# Patient Record
Sex: Female | Born: 1950 | Race: White | Hispanic: No | Marital: Married | Smoking: Current every day smoker
Health system: Southern US, Community
[De-identification: ages and names within clinical notes are randomized; demographics above are authoritative.]

## PROBLEM LIST (undated history)

## (undated) DIAGNOSIS — I10 Essential (primary) hypertension: Secondary | ICD-10-CM

## (undated) DIAGNOSIS — S4292XA Fracture of left shoulder girdle, part unspecified, initial encounter for closed fracture: Secondary | ICD-10-CM

## (undated) DIAGNOSIS — E039 Hypothyroidism, unspecified: Secondary | ICD-10-CM

## (undated) DIAGNOSIS — S62109A Fracture of unspecified carpal bone, unspecified wrist, initial encounter for closed fracture: Secondary | ICD-10-CM

## (undated) DIAGNOSIS — I5020 Unspecified systolic (congestive) heart failure: Secondary | ICD-10-CM

## (undated) DIAGNOSIS — I4891 Unspecified atrial fibrillation: Secondary | ICD-10-CM

## (undated) DIAGNOSIS — R140 Abdominal distension (gaseous): Secondary | ICD-10-CM

## (undated) DIAGNOSIS — K449 Diaphragmatic hernia without obstruction or gangrene: Secondary | ICD-10-CM

## (undated) DIAGNOSIS — F0390 Unspecified dementia without behavioral disturbance: Secondary | ICD-10-CM

## (undated) DIAGNOSIS — R51 Headache: Secondary | ICD-10-CM

## (undated) DIAGNOSIS — I447 Left bundle-branch block, unspecified: Secondary | ICD-10-CM

## (undated) DIAGNOSIS — K589 Irritable bowel syndrome without diarrhea: Secondary | ICD-10-CM

## (undated) DIAGNOSIS — T4145XA Adverse effect of unspecified anesthetic, initial encounter: Secondary | ICD-10-CM

## (undated) DIAGNOSIS — R413 Other amnesia: Secondary | ICD-10-CM

## (undated) DIAGNOSIS — R1013 Epigastric pain: Secondary | ICD-10-CM

## (undated) DIAGNOSIS — B9681 Helicobacter pylori [H. pylori] as the cause of diseases classified elsewhere: Secondary | ICD-10-CM

## (undated) DIAGNOSIS — K219 Gastro-esophageal reflux disease without esophagitis: Secondary | ICD-10-CM

## (undated) DIAGNOSIS — T8859XA Other complications of anesthesia, initial encounter: Secondary | ICD-10-CM

## (undated) DIAGNOSIS — K2970 Gastritis, unspecified, without bleeding: Secondary | ICD-10-CM

## (undated) HISTORY — DX: Epigastric pain: R10.13

## (undated) HISTORY — DX: Irritable bowel syndrome, unspecified: K58.9

## (undated) HISTORY — DX: Other amnesia: R41.3

## (undated) HISTORY — DX: Unspecified systolic (congestive) heart failure: I50.20

## (undated) HISTORY — DX: Unspecified atrial fibrillation: I48.91

## (undated) HISTORY — PX: CHOLECYSTECTOMY: SHX55

## (undated) HISTORY — PX: TUBAL LIGATION: SHX77

## (undated) HISTORY — DX: Gastritis, unspecified, without bleeding: K29.70

## (undated) HISTORY — DX: Abdominal distension (gaseous): R14.0

## (undated) HISTORY — DX: Diaphragmatic hernia without obstruction or gangrene: K44.9

## (undated) HISTORY — DX: Headache: R51

## (undated) HISTORY — PX: APPENDECTOMY: SHX54

## (undated) HISTORY — DX: Helicobacter pylori (H. pylori) as the cause of diseases classified elsewhere: B96.81

---

## 2000-08-27 ENCOUNTER — Encounter: Payer: Self-pay | Admitting: Family Medicine

## 2000-08-27 ENCOUNTER — Ambulatory Visit (HOSPITAL_COMMUNITY): Admission: RE | Admit: 2000-08-27 | Discharge: 2000-08-27 | Payer: Self-pay | Admitting: Family Medicine

## 2000-09-30 ENCOUNTER — Emergency Department (HOSPITAL_COMMUNITY): Admission: EM | Admit: 2000-09-30 | Discharge: 2000-09-30 | Payer: Self-pay | Admitting: Emergency Medicine

## 2001-09-10 ENCOUNTER — Encounter: Payer: Self-pay | Admitting: Family Medicine

## 2001-09-10 ENCOUNTER — Ambulatory Visit (HOSPITAL_COMMUNITY): Admission: RE | Admit: 2001-09-10 | Discharge: 2001-09-10 | Payer: Self-pay | Admitting: Family Medicine

## 2001-09-17 ENCOUNTER — Ambulatory Visit (HOSPITAL_COMMUNITY): Admission: RE | Admit: 2001-09-17 | Discharge: 2001-09-17 | Payer: Self-pay | Admitting: Family Medicine

## 2001-09-17 ENCOUNTER — Encounter: Payer: Self-pay | Admitting: Family Medicine

## 2001-12-16 ENCOUNTER — Encounter: Payer: Self-pay | Admitting: Internal Medicine

## 2001-12-16 ENCOUNTER — Ambulatory Visit (HOSPITAL_COMMUNITY): Admission: RE | Admit: 2001-12-16 | Discharge: 2001-12-16 | Payer: Self-pay | Admitting: Internal Medicine

## 2002-02-13 DIAGNOSIS — B9681 Helicobacter pylori [H. pylori] as the cause of diseases classified elsewhere: Secondary | ICD-10-CM

## 2002-02-13 HISTORY — DX: Helicobacter pylori (H. pylori) as the cause of diseases classified elsewhere: B96.81

## 2002-09-17 ENCOUNTER — Encounter: Payer: Self-pay | Admitting: Family Medicine

## 2002-09-17 ENCOUNTER — Ambulatory Visit (HOSPITAL_COMMUNITY): Admission: RE | Admit: 2002-09-17 | Discharge: 2002-09-17 | Payer: Self-pay | Admitting: Family Medicine

## 2002-11-03 ENCOUNTER — Ambulatory Visit (HOSPITAL_COMMUNITY): Admission: RE | Admit: 2002-11-03 | Discharge: 2002-11-03 | Payer: Self-pay | Admitting: Internal Medicine

## 2002-11-03 ENCOUNTER — Encounter: Payer: Self-pay | Admitting: Internal Medicine

## 2003-06-26 ENCOUNTER — Ambulatory Visit (HOSPITAL_COMMUNITY): Admission: RE | Admit: 2003-06-26 | Discharge: 2003-06-26 | Payer: Self-pay | Admitting: Internal Medicine

## 2003-06-26 DIAGNOSIS — K449 Diaphragmatic hernia without obstruction or gangrene: Secondary | ICD-10-CM

## 2003-06-26 HISTORY — DX: Diaphragmatic hernia without obstruction or gangrene: K44.9

## 2003-06-26 HISTORY — PX: COLONOSCOPY: SHX174

## 2003-06-26 HISTORY — PX: UPPER GASTROINTESTINAL ENDOSCOPY: SHX188

## 2003-09-18 ENCOUNTER — Ambulatory Visit (HOSPITAL_COMMUNITY): Admission: RE | Admit: 2003-09-18 | Discharge: 2003-09-18 | Payer: Self-pay | Admitting: Unknown Physician Specialty

## 2004-08-29 ENCOUNTER — Ambulatory Visit (HOSPITAL_COMMUNITY): Admission: RE | Admit: 2004-08-29 | Discharge: 2004-08-29 | Payer: Self-pay | Admitting: Family Medicine

## 2004-09-19 ENCOUNTER — Ambulatory Visit (HOSPITAL_COMMUNITY): Admission: RE | Admit: 2004-09-19 | Discharge: 2004-09-19 | Payer: Self-pay | Admitting: Internal Medicine

## 2004-09-29 ENCOUNTER — Encounter: Admission: RE | Admit: 2004-09-29 | Discharge: 2004-09-29 | Payer: Self-pay | Admitting: Internal Medicine

## 2004-10-04 ENCOUNTER — Encounter: Admission: RE | Admit: 2004-10-04 | Discharge: 2004-10-04 | Payer: Self-pay | Admitting: Internal Medicine

## 2004-10-04 ENCOUNTER — Encounter (INDEPENDENT_AMBULATORY_CARE_PROVIDER_SITE_OTHER): Payer: Self-pay | Admitting: Specialist

## 2004-10-27 ENCOUNTER — Ambulatory Visit (HOSPITAL_COMMUNITY): Admission: RE | Admit: 2004-10-27 | Discharge: 2004-10-27 | Payer: Self-pay | Admitting: Family Medicine

## 2004-11-29 ENCOUNTER — Ambulatory Visit (HOSPITAL_COMMUNITY): Admission: RE | Admit: 2004-11-29 | Discharge: 2004-11-29 | Payer: Self-pay | Admitting: Family Medicine

## 2005-09-03 ENCOUNTER — Emergency Department (HOSPITAL_COMMUNITY): Admission: EM | Admit: 2005-09-03 | Discharge: 2005-09-03 | Payer: Self-pay | Admitting: Emergency Medicine

## 2005-09-20 ENCOUNTER — Encounter: Admission: RE | Admit: 2005-09-20 | Discharge: 2005-09-20 | Payer: Self-pay | Admitting: Unknown Physician Specialty

## 2006-01-25 ENCOUNTER — Ambulatory Visit (HOSPITAL_COMMUNITY): Admission: RE | Admit: 2006-01-25 | Discharge: 2006-01-25 | Payer: Self-pay | Admitting: Family Medicine

## 2006-02-23 ENCOUNTER — Ambulatory Visit (HOSPITAL_COMMUNITY): Admission: RE | Admit: 2006-02-23 | Discharge: 2006-02-23 | Payer: Self-pay | Admitting: Endocrinology

## 2006-06-05 ENCOUNTER — Ambulatory Visit (HOSPITAL_COMMUNITY): Admission: RE | Admit: 2006-06-05 | Discharge: 2006-06-05 | Payer: Self-pay | Admitting: Endocrinology

## 2006-09-04 LAB — CBC AND DIFFERENTIAL
Platelets: 381 10*3/uL (ref 150–399)
WBC: 9.6 10^3/mL

## 2006-09-11 LAB — BASIC METABOLIC PANEL
Potassium: 4.5 mmol/L (ref 3.4–5.3)
Sodium: 136 mmol/L — AB (ref 137–147)

## 2006-09-25 ENCOUNTER — Ambulatory Visit (HOSPITAL_COMMUNITY): Admission: RE | Admit: 2006-09-25 | Discharge: 2006-09-25 | Payer: Self-pay | Admitting: Internal Medicine

## 2007-01-18 ENCOUNTER — Ambulatory Visit (HOSPITAL_COMMUNITY): Admission: RE | Admit: 2007-01-18 | Discharge: 2007-01-18 | Payer: Self-pay | Admitting: Family Medicine

## 2007-02-12 ENCOUNTER — Ambulatory Visit (HOSPITAL_COMMUNITY): Admission: RE | Admit: 2007-02-12 | Discharge: 2007-02-12 | Payer: Self-pay | Admitting: Family Medicine

## 2007-07-16 ENCOUNTER — Ambulatory Visit (HOSPITAL_COMMUNITY): Admission: RE | Admit: 2007-07-16 | Discharge: 2007-07-16 | Payer: Self-pay | Admitting: Family Medicine

## 2007-10-08 ENCOUNTER — Ambulatory Visit (HOSPITAL_COMMUNITY): Admission: RE | Admit: 2007-10-08 | Discharge: 2007-10-08 | Payer: Self-pay | Admitting: Internal Medicine

## 2008-03-02 ENCOUNTER — Ambulatory Visit (HOSPITAL_COMMUNITY): Admission: RE | Admit: 2008-03-02 | Discharge: 2008-03-02 | Payer: Self-pay | Admitting: Family Medicine

## 2008-06-09 ENCOUNTER — Ambulatory Visit (HOSPITAL_COMMUNITY): Admission: RE | Admit: 2008-06-09 | Discharge: 2008-06-09 | Payer: Self-pay | Admitting: Family Medicine

## 2008-06-16 ENCOUNTER — Ambulatory Visit (HOSPITAL_COMMUNITY): Admission: RE | Admit: 2008-06-16 | Discharge: 2008-06-16 | Payer: Self-pay | Admitting: Endocrinology

## 2009-05-25 ENCOUNTER — Ambulatory Visit (HOSPITAL_COMMUNITY): Admission: RE | Admit: 2009-05-25 | Discharge: 2009-05-25 | Payer: Self-pay | Admitting: Family Medicine

## 2009-06-10 ENCOUNTER — Ambulatory Visit (HOSPITAL_COMMUNITY): Admission: RE | Admit: 2009-06-10 | Discharge: 2009-06-10 | Payer: Self-pay | Admitting: Unknown Physician Specialty

## 2009-07-21 ENCOUNTER — Ambulatory Visit (HOSPITAL_COMMUNITY): Admission: RE | Admit: 2009-07-21 | Discharge: 2009-07-21 | Payer: Self-pay | Admitting: Endocrinology

## 2010-03-06 ENCOUNTER — Encounter: Payer: Self-pay | Admitting: Endocrinology

## 2010-03-06 ENCOUNTER — Encounter: Payer: Self-pay | Admitting: Internal Medicine

## 2010-03-06 ENCOUNTER — Encounter: Payer: Self-pay | Admitting: Family Medicine

## 2010-03-07 ENCOUNTER — Encounter: Payer: Self-pay | Admitting: Unknown Physician Specialty

## 2010-05-03 ENCOUNTER — Other Ambulatory Visit (HOSPITAL_COMMUNITY): Payer: Self-pay | Admitting: Unknown Physician Specialty

## 2010-05-03 DIAGNOSIS — Z139 Encounter for screening, unspecified: Secondary | ICD-10-CM

## 2010-05-09 ENCOUNTER — Ambulatory Visit (INDEPENDENT_AMBULATORY_CARE_PROVIDER_SITE_OTHER): Payer: 59 | Admitting: Internal Medicine

## 2010-05-09 DIAGNOSIS — K270 Acute peptic ulcer, site unspecified, with hemorrhage: Secondary | ICD-10-CM

## 2010-06-02 NOTE — Consult Note (Signed)
  NAME:  Melanie Bradley, Melanie Bradley             ACCOUNT NO.:  [id number]  MEDICAL RECORD NO.:  [id number]           PATIENT TYPE:  LOCATION:[facility]           FACILITY:  PHYSICIAN:  Lionel December, M.D.    DATE OF BIRTH:  Oct 02, 1950  DATE OF CONSULTATION: DATE OF DISCHARGE:                                CONSULTATION   ADDENDUM.  Home medications include Tenormin 25 mg daily, Synthroid 100 mcg a day, Antivert 25 mg b.i.d. as needed, Tylenol on a p.r.n. basis.  RECOMMENDATIONS:  I will start her on omeprazole 40 mg one a day.  She will call with a progress report in 2 weeks.    ______________________________ Dorene Ar, NP   ______________________________ Lionel December, M.D.    TS/MEDQ  D:  05/09/2010  T:  05/09/2010  Job:  [id number]  Electronically Signed by Dorene Ar PA on 05/18/2010 04:49:29 PM Electronically Signed by Dorene Ar PA on 06/02/2010 05:16:18 PM

## 2010-06-02 NOTE — Consult Note (Signed)
  NAME:  Melanie Bradley, STILLE             ACCOUNT NO.:  [id number]  MEDICAL RECORD NO.:  [id number]           PATIENT TYPE:  LOCATION:                                 FACILITY:  PHYSICIAN:  Lionel December, M.D.    DATE OF BIRTH:  1950-08-13  DATE OF CONSULTATION: DATE OF DISCHARGE:                                CONSULTATION   HISTORY OF PRESENT ILLNESS:  Melanie Bradley is a 60 year old white female presenting today with complaints of a burning sensation in her epigastric region.  She says that her abdomen has hurt for several months after she eats.  She states she has been more nervous lately since having to take care of her father which is in his 41s.  She describes as a burning sensation in her epigastric region.  She states she does eat healthy.  She bakes, broils, or grills her foods.  She was treated for H. pylori in 2004.  She rates the pain at a 7/10 when it occur.  She states she thinks may have lost about 5 pounds.  She also complains of bloating.  She states she has tried a teaspoon of vinegar which she says helps with the pain.  She does have a history of a spastic colon and IBS.  She says sometimes she will have 4-5 stools a day, but most of the time she will have 2-3  With her IBS, she states she will eat and then have to go to the bathroom to have a bowel movement..  She denies any weight loss.  No dysphagia.  She does have epigastric burning.  Her last EGD and colonoscopy was in May 2005.  The EGD revealed a small sliding hiatal hernia with an 8 mm tongue of gastric tight mucosa at the distal esophagus.  There was no evidence of peptic ulcer disease.  She does have bowel in her stomach which may are not have anything to do with her recurrent pain.  Her colonoscopy revealed a few diverticula of the sigmoid colon with one of the transverse colon.  Otherwise, normal colonoscopy and terminal ileoscopy.  She had small external hemorrhoids.  ASSESSMENT:  Melanie Bradley is a 60 year old  female with complaints of epigastric burning.  Peptic ulcer disease does need to be ruled out.  RECOMMENDATIONS:  I am going to start her on omeprazole 40 mg one a day. She will call with a progress report in 2 weeks.  If she is not better she may very well need an EGD in the near future.  Thank you for allowing [place] to participate in her care.    ______________________________ Dorene Ar, NP   ______________________________ Lionel December, M.D.   TS/MEDQ  D:  05/09/2010  T:  05/09/2010  Job:  [id number]  Electronically Signed by Dorene Ar PA on 05/18/2010 04:49:10 PM Electronically Signed by Dorene Ar PA on 06/02/2010 05:15:40 PM

## 2010-06-13 ENCOUNTER — Ambulatory Visit (HOSPITAL_COMMUNITY)
Admission: RE | Admit: 2010-06-13 | Discharge: 2010-06-13 | Disposition: A | Discharge status: Home / Self Care | Payer: 59 | Source: Ambulatory Visit | Attending: Unknown Physician Specialty | Admitting: Unknown Physician Specialty

## 2010-06-13 DIAGNOSIS — Z1231 Encounter for screening mammogram for malignant neoplasm of breast: Secondary | ICD-10-CM | POA: Insufficient documentation

## 2010-06-13 DIAGNOSIS — Z139 Encounter for screening, unspecified: Secondary | ICD-10-CM

## 2010-07-01 NOTE — Op Note (Signed)
NAME:  Melanie, Bradley                       ACCOUNT NO.:  [id number]   MEDICAL RECORD NO.:  [id number]                   PATIENT TYPE:  AMB   LOCATION:  DAY                                  FACILITY:  [facility]   PHYSICIAN:  Lionel December, M.D.                 DATE OF BIRTH:  [birth year]   DATE OF PROCEDURE:  06/26/2003  DATE OF DISCHARGE:                                 OPERATIVE REPORT   PROCEDURE:  Esophagogastroduodenoscopy followed by total colonoscopy.   INDICATIONS FOR PROCEDURE:  Melanie Bradley is a 60 year old Caucasian female with  over a six-week history of abdominal pain mainly centered in her epigastrium  and right upper quadrant with at times experiencing right lower quadrant  pain who has not responded to double dose PPIs.  She has chronic GERD but  does not take PPI on a regular basis.  She has been treated for H pylori  gastritis back in September of 2004.  She also had upper abdominal  ultrasound which was negative for gastritis in September of last year but  does not feel any better.  She is undergoing diagnostic EGD followed by  screening colonoscopy.  The procedure risks were reviewed with the patient,  and informed consent for the procedure was obtained.   PREOPERATIVE MEDICATIONS:  Cetacaine spray for pharyngeal topical  anesthesia, Demerol 40 mg IV, Versed 9 mg IV in divided dose.   FINDINGS:  The procedures were performed in the endoscopy suite.  The  patient's vital signs and O2 saturations were monitored during the procedure  and remained stable.   PROCEDURE #1, ESOPHAGOGASTRODUODENOSCOPY:  The patient was placed in the  left lateral recumbent position, and the Olympus videoscope was passed via  the oropharynx without any difficulty into the esophagus.   Esophagus:  The mucosa of the esophagus was normal, except she had an 8-mm  long thin tongue of gastric-type mucosa.  There was a small sliding hiatal  hernia no more than 3 cm.  There was no stricture or ulcer  noted.   Stomach:  It was empty and distended very well with insufflation.  There was  some bile in it, but there was no food debris.  The pyloric channel was  patent.  The angularis, fundus, and cardia were examined by retroflexing the  scope and were normal.   Duodenum:  Examination of the bulb and postbulbar duodenum was normal.  The  endoscope was withdrawn and the patient prepared for procedure #2.   PROCEDURE #2, TOTAL COLONOSCOPY:  Rectal examination was performed.  No  abnormality noted on external or digital exam.  The Olympus videoscope was  placed into the rectum and advanced into the region of the sigmoid colon and  beyond.  The preparation was excellent.  There were a few small diverticula  at the sigmoid colon, with one at the transverse colon.  The scope was  passed into the cecum which was  identified by the ileocecal valve and  appendiceal orifice/stump.  Pictures were taken for the record.  A short  segment of TI was also examined and was normal.  As the scope was withdrawn,  the colonic mucosa was carefully examined for the second time, and there  were no polyps  and/or tumor masses.  The rectal mucosa similarly was  normal.  The scope was retroflexed to examine the anorectal junction, and  small hemorrhoids were noted below the dentate line.  The endoscope was  straightened and withdrawn.  The patient tolerated the procedure well.   FINAL DIAGNOSES:  1. Small sliding hiatal hernia with 8-mm tongue of gastric-type mucosa at     distal esophagus.  2. No evidence of peptic ulcer disease.  She does have bile in her stomach     which may or may not have anything to do with her recurrent pain.  3. A few diverticula at the sigmoid colon with one at transverse colon.     Otherwise normal colonoscopy and terminal ileoscopy.  4. Small external hemorrhoids.   RECOMMENDATIONS:  1. She will continue antireflux measures and Prevacid, but drop the dose to     30 mg p.o.  q.a.m.  2. High fiber diet.  3. Levbid one-half to one tablet every morning.  4. She will call with progress report in a few weeks.  If she remains     symptomatic, she will need further workup.      ___________________________________________                                            Lionel December, M.D.   NR/MEDQ  D:  06/26/2003  T:  06/26/2003  Job:  [id number]   cc:   Patrica Duel, M.D.  [address]  [place]  [place] [address]  Fax: [phone]

## 2010-07-01 NOTE — Consult Note (Signed)
NAME:  Melanie Bradley, Melanie Bradley                         ACCOUNT NO.:  [id number]   MEDICAL RECORD NO.:  [id number]                  PATIENT TYPE:   LOCATION:                                       FACILITY:   PHYSICIAN:  Lionel December, M.D.                 DATE OF BIRTH:  06-05-1950   DATE OF CONSULTATION:  06/18/2003  DATE OF DISCHARGE:                                   CONSULTATION   GASTROINTESTINAL CONSULT   REFERRING PHYSICIAN:  Patrica Duel, M.D.   REASON FOR CONSULTATION:  Epigastric pain.   HISTORY OF PRESENT ILLNESS:  This patient is a 60 year old Caucasian female  who presents with a 6-week history of right upper quadrant as well as  epigastric pain which she describes as annoying  The pain radiates to her  shoulder through her back as well as her right shoulder and chest.  She is  status post cholecystectomy in 1993 by Dr. Malvin Johns.  She states that this  pain has been persistent for approximately 6 months; however, in the last 6  weeks has worsened. She complains of nausea; however, she denies any emesis.  She denies any association with food. She does report occasional reflux and  is currently on Prevacid 30 mg b.i.d.  This has helped some; however, not  100%.  She denies any dysphagia or odynophagia.  She reports positive H.  pylori back in September 2004 and was treated with triple drug therapy by  Dr. Nobie Putnam.  Bowel movements have been soft and brown 3-4 times a day,  nonmucous-like without any rectal bleeding or melena.   CURRENT MEDICATIONS:  1. Tenormin 25 mg daily.  2. Antivert 25 mg b.i.d.  3. Synthroid 1 mcg 6 days a week.  4. Prevacid 30 mg b.i.d.  5. Xanax 0.25 mg p.r.n.  6. Vitamin D 800 units daily.  7. Calcium 600 mg daily.   ALLERGIES:  SULFA and PENICILLIN both cause rash and swelling; NUBAIN which  causes anxiety.   PAST MEDICAL HISTORY:  1. Positive H. pylori treated with triple drug therapy hiatal hernia.  2. IBS.  3. Hyperthyroidism which is  being followed by Dr. Patrecia Pace.  4. GERD.   PAST SURGICAL HISTORY:  1. Cholecystectomy by Dr. Malvin Johns in 1993 secondary to cholecystitis.  2. Tubal ligation in 1996.  3. __________ surgery in 1991.  4. C-section in 1984.  5. Appendectomy many years ago.   FAMILY HISTORY:  No known family history of colorectal carcinoma, liver or  chronic GI problems other than father's side of family with peptic ulcer  disease.  Mother deceased at age 54 secondary to anaphylaxis from bee venom.  Father age 79 and healthy.  She has 3 healthy sisters, 1 brother with CHF, 2  healthy.   SOCIAL HISTORY:  She has been married for 25 years. She reports history of  loosing a child approximately 15 years ago secondary to pneumonia.  She is  employed full time as a Public house manager.  She reports 1/4 pack per day  cigarette use for the last 25 years.  She denies any alcohol or drug use.   REVIEW OF SYSTEMS:  CONSTITUTIONAL:  Weight is stable.  Appetite is okay.  She is complaining of occasional fatigue.  She is complaining of some  insomnia as well.  CARDIOVASCULAR:  She denies any chest pain or  palpitations other than as described in the HPI.  PULMONARY:  She denies any  shortness of breath, cough, dyspnea or hemoptysis.  GI:  See HPI.   PHYSICAL EXAMINATION:  VITAL SIGNS:  Weight 135.5 pounds.  Temperature 97.4,  blood pressure 20/74, pulse 60.  GENERAL:  This patient is a 60 year old well-developed well-nourished  Caucasian female in no acute distress.  She is alert, oriented, pleasant and  cooperative.  HEENT:  Sclerae are clear.  Nonicteric.  Conjunctivae pink. Oropharynx pink  and moist without any lesions.  NECK:  Supple without any mass.  She does have a small nodule less than 1 cm  right thyroid.  HEART:  Regular rate and rhythm with normal S1-S2 without any murmurs,  clicks, rubs or gallops.  LUNGS:  Clear to auscultation bilaterally.  ABDOMEN:  Positive bowel sounds x4.  No bruits  auscultated.  Soft.  She does  have some mild tenderness to the left lower quadrant on deep palpation.  No  palpable hepatosplenomegaly.  RECTAL:  No external hemorrhoids visualized.  Good sphincter tone.  She does  have a small amount of yellowish-brown, formed stool in the vault which was  hemoccult negative.  EXTREMITIES:  No edema.  SKIN:  Pink, warm and dry, without any rash or jaundice.   ASSESSMENT:  This is a 60 year old Caucasian female with a 53-month history  of persistent epigastric and right upper quadrant abdominal pain.  She also  has an element of poorly controlled reflux.  She has been treated for H.  pylori in September of last year.  She also has an element of irritable  bowel syndrome as well.  Given the atypical nature of her pain would  recommend further evaluation by EGD.   RECOMMENDATIONS:  1. Will schedule EGD at [facility] in the near future.  I have     discussed this procedure including risks and benefits which include, but     are not limited to, bleeding, infection, perforation, and drug reaction     with the patient.  She agrees with this plan.  Consent will be obtained.     She is also requesting screening colonoscopy at this time as she has not     had one yet.  2. She is concerned with anxiety prior to the procedures .  I have     instructed her to double her typical dose of Xanax which would be 0.5 mg     prior to the procedure; however, she is instructed not to drive and to     have somebody to drive her home as usual.  3. Continue Prevacid 30 mg b.i.d.  4. Further recommendations pending procedure.   We would like to thank Dr. Nobie Putnam for allowing [place] to participate in the  care of this patient.     ________________________________________  ___________________________________________  Nicholas Lose, N.P.                  Lionel December, M.D.   KC/MEDQ  D:  06/18/2003  T:  06/19/2003  Job:  [id number]  cc:   Patrica Duel, M.D.   [address], [address]  [place]  [place] [address]  Fax: [phone]   Lionel December, M.D.  [address]  [place]  [place] [address]  Fax: [phone]

## 2010-07-21 ENCOUNTER — Other Ambulatory Visit (HOSPITAL_COMMUNITY): Payer: Self-pay | Admitting: Endocrinology

## 2010-07-21 DIAGNOSIS — M8580 Other specified disorders of bone density and structure, unspecified site: Secondary | ICD-10-CM

## 2010-07-26 ENCOUNTER — Other Ambulatory Visit (HOSPITAL_COMMUNITY): Payer: 59

## 2010-07-28 ENCOUNTER — Ambulatory Visit (HOSPITAL_COMMUNITY)
Admission: RE | Admit: 2010-07-28 | Discharge: 2010-07-28 | Disposition: A | Discharge status: Home / Self Care | Payer: 59 | Source: Ambulatory Visit | Attending: Endocrinology | Admitting: Endocrinology

## 2010-07-28 ENCOUNTER — Encounter (HOSPITAL_COMMUNITY): Payer: Self-pay

## 2010-07-28 DIAGNOSIS — M8580 Other specified disorders of bone density and structure, unspecified site: Secondary | ICD-10-CM

## 2010-07-28 DIAGNOSIS — M899 Disorder of bone, unspecified: Secondary | ICD-10-CM | POA: Insufficient documentation

## 2010-07-28 HISTORY — DX: Essential (primary) hypertension: I10

## 2010-10-20 ENCOUNTER — Encounter (INDEPENDENT_AMBULATORY_CARE_PROVIDER_SITE_OTHER): Payer: Self-pay

## 2010-11-04 ENCOUNTER — Ambulatory Visit (HOSPITAL_COMMUNITY)
Admission: RE | Admit: 2010-11-04 | Discharge: 2010-11-04 | Disposition: A | Discharge status: Home / Self Care | Payer: 59 | Source: Ambulatory Visit | Attending: Family Medicine | Admitting: Family Medicine

## 2010-11-04 ENCOUNTER — Other Ambulatory Visit (HOSPITAL_COMMUNITY): Payer: Self-pay | Admitting: Family Medicine

## 2010-11-04 DIAGNOSIS — R1310 Dysphagia, unspecified: Secondary | ICD-10-CM | POA: Insufficient documentation

## 2010-11-04 DIAGNOSIS — K219 Gastro-esophageal reflux disease without esophagitis: Secondary | ICD-10-CM

## 2010-11-04 DIAGNOSIS — R079 Chest pain, unspecified: Secondary | ICD-10-CM

## 2010-11-04 DIAGNOSIS — R0789 Other chest pain: Secondary | ICD-10-CM | POA: Insufficient documentation

## 2010-11-25 ENCOUNTER — Ambulatory Visit: Payer: 59 | Admitting: Internal Medicine

## 2010-12-02 ENCOUNTER — Encounter: Payer: Self-pay | Admitting: Internal Medicine

## 2010-12-02 ENCOUNTER — Ambulatory Visit (INDEPENDENT_AMBULATORY_CARE_PROVIDER_SITE_OTHER): Payer: BC Managed Care – PPO | Admitting: Internal Medicine

## 2010-12-02 ENCOUNTER — Ambulatory Visit: Payer: 59 | Admitting: Internal Medicine

## 2010-12-02 DIAGNOSIS — I10 Essential (primary) hypertension: Secondary | ICD-10-CM

## 2010-12-02 DIAGNOSIS — R079 Chest pain, unspecified: Secondary | ICD-10-CM

## 2010-12-02 NOTE — Progress Notes (Signed)
HPI Patient is a 60 year old with no known CAD Walks/runs regularly.  Eats healthy.     CP is a discomfort nagging.  Not  With activity.  Better, less with aciphex.  Still gets some reflux. BP is usually in 120s/     Allergies  Allergen Reactions  . Benadryl (Altaryl)   . Codeine   . Penicillins   . Sulfa Antibiotics     Current Outpatient Prescriptions  Medication Sig Dispense Refill  . acetaminophen (TYLENOL) 100 MG/ML solution Take 10 mg/kg by mouth every 4 (four) hours as needed.        . ALPRAZolam (XANAX) 0.5 MG tablet As  needed      . atenolol (TENORMIN) 25 MG tablet Take 25 mg by mouth daily.        . calcium-vitamin D (OSCAL WITH D) 500-200 MG-UNIT per tablet Take 1 tablet by mouth daily.        Marland Kitchen levothyroxine (SYNTHROID, LEVOTHROID) 100 MCG tablet Take 100 mcg by mouth daily.        . meclizine (ANTIVERT) 25 MG tablet Pt only takes every so often       . RABEprazole (ACIPHEX) 20 MG tablet Take 20 mg by mouth daily.          Past Medical History  Diagnosis Date  . Hypertension   . Epigastric burning sensation   . Bloating   . Spastic colon   . Hiatal hernia   . Diverticula of colon     Past Surgical History  Procedure Date  . Upper gastrointestinal endoscopy 06/26/03  . Colonoscopy 06/26/03    No family history on file.  History   Social History  . Marital Status: Married    Spouse Name: N/A    Number of Children: N/A  . Years of Education: N/A   Occupational History  . Not on file.   Social History Main Topics  . Smoking status: Current Everyday Smoker  . Smokeless tobacco: Not on file  . Alcohol Use: Not on file  . Drug Use: Not on file  . Sexually Active: Not on file   Other Topics Concern  . Not on file   Social History Narrative  . No narrative on file    Review of Systems:  All systems reviewed.  They are negative to the above problem except as previously stated.  Vital Signs: BP 142/94  Pulse 67  Ht 5\' 3"  (1.6 m)  Wt 113 lb  (51.256 kg)  BMI 20.02 kg/m2  Physical Exam  Patient is in NAD. HEENT:  Normocephalic, atraumatic. EOMI, PERRLA.  Neck: JVP is normal. No thyromegaly. No bruits.  Lungs: clear to auscultation. No rales no wheezes.  Heart: Regular rate and rhythm. Normal S1, S2. No S3.   No significant murmurs. PMI not displaced.  Abdomen:  Supple, nontender. Normal bowel sounds. No masses. No hepatomegaly.  Extremities:   Good distal pulses throughout. No lower extremity edema.  Musculoskeletal :moving all extremities.  Neuro:   alert and oriented x3.  CN II-XII grossly intact.   Assessment and Plan:

## 2010-12-02 NOTE — Patient Instructions (Signed)
The current medical regimen is effective;  continue present plan and medications.  Follow up as needed 

## 2010-12-05 DIAGNOSIS — R079 Chest pain, unspecified: Secondary | ICD-10-CM | POA: Insufficient documentation

## 2010-12-05 DIAGNOSIS — I10 Essential (primary) hypertension: Secondary | ICD-10-CM | POA: Insufficient documentation

## 2010-12-05 NOTE — Assessment & Plan Note (Signed)
Mildly increased  WIll need to be followed.

## 2010-12-05 NOTE — Assessment & Plan Note (Signed)
I do not think the patient's chest pain is cardiac.  Not with activity.  Nagging, mild discomfort.  Probable GI I encouraged her to continue acid inhibitor.  I would not recomm furhter thesting unless her symptoms change or worsen.

## 2011-02-24 ENCOUNTER — Ambulatory Visit (INDEPENDENT_AMBULATORY_CARE_PROVIDER_SITE_OTHER): Payer: BC Managed Care – PPO | Admitting: Internal Medicine

## 2011-02-24 ENCOUNTER — Encounter: Payer: Self-pay | Admitting: Internal Medicine

## 2011-02-24 DIAGNOSIS — R1013 Epigastric pain: Secondary | ICD-10-CM | POA: Insufficient documentation

## 2011-02-24 DIAGNOSIS — K589 Irritable bowel syndrome without diarrhea: Secondary | ICD-10-CM | POA: Insufficient documentation

## 2011-02-24 DIAGNOSIS — K3189 Other diseases of stomach and duodenum: Secondary | ICD-10-CM

## 2011-02-24 NOTE — Patient Instructions (Addendum)
egd to evaluate abdominal pain in 2 weeks  Stop aciphex  Add probiotic (provide office samples)  Further recommendations to follow  Stool checked for occult blood now

## 2011-02-24 NOTE — Assessment & Plan Note (Signed)
[name] is a delightful 61 year old lady with chronic upper abdominal discomfort and vague intermittent esophageal dysphagia to pills and solids which has been long-standing in duration without progression. However, this but it has been a recurrent aggravation for this nice lady. She has been investigated previously and as outlined above. I do not detect any alarm symptoms. 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 However, it's been quite a while since she has had her upper GI tract evaluated. Her vague symptoms of dysphagia warrants further evaluation. Her gas bloat symptoms are entirely non-specific.  Recommendations: Stop AcipHex and I'll as suppression therapy for now. We'll either check a stool in addition to yesterday gastric mucosal biopsies to determine whether or not H. pylori has been, in fact, eradicated.  She should be screened for celiac disease  Trial of probiotics in the way of restora. Samples provided.  We'll send off a stool sample for for fecal occult blood testing  Diagnostic EGD in the near future.The risks, benefits, limitations, alternatives and imponderables have been reviewed with the patient. Potential for esophageal dilation, biopsy, etc. have also been reviewed.  Questions have been answered. All parties agreeable.  Further recommendations to follow.  I want to thank Dr. Assunta Found for asking me to see this nice lady today.

## 2011-02-24 NOTE — Progress Notes (Signed)
Patient ID: Melanie Bradley, female   DOB: 1950-05-29, 61 y.o.   MRN: [id number] Primary Care Physician:  Colette Ribas, MD, MD Primary Gastroenterologist:  Dr. Jena Gauss  Pre-Procedure History & Physical: HPI:  Melanie Bradley is a 61 y.o. female here for evaluation of abdominal pain. Patient size lifelong history of knawing squeezing epigastric pain after she eats. Sometimes has difficulty with solid seating pills making it down. Feels that they sometimes hanging behind her xiphoid process. No typical reflux symptoms. Has occasional post abdominal cramps and diarrhea. No melena rectal bleeding. Has had intentional weight loss to maintain a healthier BMI. Saw Dr. Karilyn Cota and associates last summer. Treated for H. pylori based on positive serologies. She tells me that she was treated for H. pylori previously by Dr. Sherwood Gambler and associates based on positive serologies . Neither acid suppression therapy or treatment for H. pylori has made a difference in her upper GI tract symptoms.  No family history of colon polyps or colon cancer other GI chronic GI illness. EGD and colonoscopy by Dr. Karilyn Cota back in May 2005 demonstrated a small hiatal hernia and an 8 mm tongue of gastric type mucosa- distal esophagus. She had diverticulosis on colonoscopy. Her upper GI tract was otherwise unremarkable he negative. Gallbladder out  Past Medical History  Diagnosis Date  . Hypertension   . Epigastric burning sensation   . Bloating   . Spastic colon   . Hiatal hernia   . Diverticula of colon   . Hiatal hernia 06/26/03    Dr. Karilyn Cota egd  . Hemorrhoids 06/26/03    Dr. Karilyn Cota tcs    Past Surgical History  Procedure Date  . Upper gastrointestinal endoscopy 06/26/03  . Colonoscopy 06/26/03  . Cholecystectomy     Prior to Admission medications   Medication Sig Start Date End Date Taking? Authorizing Provider  acetaminophen (TYLENOL) 500 MG tablet Take 500 mg by mouth every 6 (six) hours as needed. Takes only one  tablet as needed occasionally for headache   Yes Historical Provider, MD  ALPRAZolam Prudy Feeler) 0.5 MG tablet As  needed 11/24/10  Yes Historical Provider, MD  atenolol (TENORMIN) 25 MG tablet Take 25 mg by mouth daily.     Yes Historical Provider, MD  calcium-vitamin D (OSCAL WITH D) 500-200 MG-UNIT per tablet Take 1 tablet by mouth daily.     Yes Historical Provider, MD  fish oil-omega-3 fatty acids 1000 MG capsule Take 1 g by mouth daily.   Yes Historical Provider, MD  levothyroxine (SYNTHROID, LEVOTHROID) 100 MCG tablet Take 100 mcg by mouth daily.     Yes Historical Provider, MD  meclizine (ANTIVERT) 25 MG tablet Pt only takes every so often    Yes Historical Provider, MD  RABEprazole (ACIPHEX) 20 MG tablet Take 20 mg by mouth daily.     Yes Historical Provider, MD    Allergies as of 02/24/2011 - Review Complete 02/24/2011  Allergen Reaction Noted  . Benadryl (altaryl)  12/02/2010  . Codeine  07/28/2010  . Penicillins  07/28/2010  . Sulfa antibiotics  07/28/2010    No family history on file.  History   Social History  . Marital Status: Married    Spouse Name: N/A    Number of Children: N/A  . Years of Education: N/A   Occupational History  . Not on file.   Social History Main Topics  . Smoking status: Current Everyday Smoker  . Smokeless tobacco: Not on file   Comment: Smokes 6 cigarettes daily  .  Alcohol Use: Not on file  . Drug Use: Not on file  . Sexually Active: Not on file   Other Topics Concern  . Not on file   Social History Narrative  . No narrative on file    Review of Systems: See HPI, otherwise negative ROS  Physical Exam: BP 124/88  Pulse 64  Temp 97.4 F (36.3 C)  Ht 5\' 3"  (1.6 m)  Wt 114 lb 9.6 oz (51.982 kg)  BMI 20.30 kg/m2 General:   Alert,  Well-developed, well-nourished, pleasant and cooperative in NAD Skin:  Intact without significant lesions or rashes. Eyes:  Sclera clear, no icterus.   Conjunctiva pink. Ears:  Normal auditory  acuity. Nose:  No deformity, discharge,  or lesions. Mouth:  No deformity or lesions. Neck:  Supple; no masses or thyromegaly. No significant cervical adenopathy. Lungs:  Clear throughout to auscultation.   No wheezes, crackles, or rhonchi. No acute distress. Heart:  Regular rate and rhythm; no murmurs, clicks, rubs,  or gallops. Abdomen:  normal bowel sounds.  Soft and nontender without appreciable mass or hepatosplenomegaly.  Pulses:  Normal pulses noted. Extremities:  Without clubbing or edema.  Impression/Plan:

## 2011-03-10 ENCOUNTER — Encounter (HOSPITAL_COMMUNITY): Payer: Self-pay | Admitting: Pharmacy Technician

## 2011-03-14 MED ORDER — SODIUM CHLORIDE 0.45 % IV SOLN
Freq: Once | INTRAVENOUS | Status: AC
  Administered 2011-03-15: 1000 mL via INTRAVENOUS

## 2011-03-15 ENCOUNTER — Ambulatory Visit (HOSPITAL_COMMUNITY)
Admission: RE | Admit: 2011-03-15 | Discharge: 2011-03-15 | Disposition: A | Discharge status: Home / Self Care | Payer: BC Managed Care – PPO | Source: Ambulatory Visit | Attending: Internal Medicine | Admitting: Internal Medicine

## 2011-03-15 ENCOUNTER — Encounter (HOSPITAL_COMMUNITY)
Admission: RE | Disposition: A | Discharge status: Home / Self Care | Payer: Self-pay | Source: Ambulatory Visit | Attending: Internal Medicine

## 2011-03-15 ENCOUNTER — Encounter (HOSPITAL_COMMUNITY): Payer: Self-pay | Admitting: *Deleted

## 2011-03-15 ENCOUNTER — Other Ambulatory Visit: Payer: Self-pay | Admitting: Internal Medicine

## 2011-03-15 DIAGNOSIS — R1310 Dysphagia, unspecified: Secondary | ICD-10-CM

## 2011-03-15 DIAGNOSIS — R1013 Epigastric pain: Secondary | ICD-10-CM

## 2011-03-15 DIAGNOSIS — K449 Diaphragmatic hernia without obstruction or gangrene: Secondary | ICD-10-CM | POA: Insufficient documentation

## 2011-03-15 DIAGNOSIS — R933 Abnormal findings on diagnostic imaging of other parts of digestive tract: Secondary | ICD-10-CM

## 2011-03-15 DIAGNOSIS — K589 Irritable bowel syndrome without diarrhea: Secondary | ICD-10-CM

## 2011-03-15 HISTORY — DX: Headache: R51

## 2011-03-15 HISTORY — PX: ESOPHAGOGASTRODUODENOSCOPY: SHX5428

## 2011-03-15 HISTORY — DX: Gastro-esophageal reflux disease without esophagitis: K21.9

## 2011-03-15 SURGERY — EGD (ESOPHAGOGASTRODUODENOSCOPY)
Anesthesia: Moderate Sedation

## 2011-03-15 MED ORDER — BUTAMBEN-TETRACAINE-BENZOCAINE 2-2-14 % EX AERO
INHALATION_SPRAY | CUTANEOUS | Status: DC | PRN
  Administered 2011-03-15: 2 via TOPICAL

## 2011-03-15 MED ORDER — MIDAZOLAM HCL 5 MG/5ML IJ SOLN
INTRAMUSCULAR | Status: AC
  Filled 2011-03-15: qty 10

## 2011-03-15 MED ORDER — MEPERIDINE HCL 50 MG/ML IJ SOLN
INTRAMUSCULAR | Status: AC
  Filled 2011-03-15: qty 1

## 2011-03-15 MED ORDER — MIDAZOLAM HCL 5 MG/5ML IJ SOLN
INTRAMUSCULAR | Status: DC | PRN
  Administered 2011-03-15: 1 mg via INTRAVENOUS
  Administered 2011-03-15: 2 mg via INTRAVENOUS

## 2011-03-15 MED ORDER — MEPERIDINE HCL 100 MG/ML IJ SOLN
INTRAMUSCULAR | Status: DC | PRN
  Administered 2011-03-15: 50 mg via INTRAVENOUS
  Administered 2011-03-15: 25 mg via INTRAVENOUS

## 2011-03-15 MED ORDER — MEPERIDINE HCL 100 MG/ML IJ SOLN
INTRAMUSCULAR | Status: AC
  Filled 2011-03-15: qty 2

## 2011-03-15 NOTE — H&P (Signed)
  I have seen & examined the patient prior to the procedure(s) today and reviewed the history and physical/consultation.  There have been no changes.  After consideration of the risks, benefits, alternatives and imponderables, the patient has consented to the procedure(s).   

## 2011-03-15 NOTE — Op Note (Signed)
[facility] [address] [place], [place]  [address]  ENDOSCOPY PROCEDURE REPORT  PATIENT:  Melanie Bradley, Melanie Bradley  MR#:  [id number] BIRTHDATE:  06-25-1950, 60 yrs. old  GENDER:  female  ENDOSCOPIST:  R. Roetta Sessions, MD Caleen Essex Referred by: Assunta Found, M.D.  PROCEDURE DATE:  03/15/2011 PROCEDURE:  EGD with gastric biopsy INDICATIONS:            esophageal dysphagia; dyspepsia-but symptoms markedly improved with the introduction of Reestoa into her regimen. In fact, she states she has no further dysphagia and is doing very well off all acid suppression medication.   INFORMED CONSENT:   The risks, benefits, limitations, alternatives and imponderables have been discussed.  The potential for biopsy, esophogeal dilation, etc. have also been reviewed.  Questions have been answered.  All parties agreeable.  Please see the history and physical in the medical record for more information.  MEDICATIONS:    Versed 3 mg IV and Demerol 75 mg in divided doses. Cetacaine spray. DESCRIPTION OF PROCEDURE:   The EG-[id number] ([id number]) endoscope was introduced through the mouth and advanced to the second portion of the duodenum without difficulty or limitations.  The mucosal surfaces were surveyed very carefully during advancement of t he scope and upon withdrawal.  Retroflexion view of the proximal stomach and esophagogastric junction was performed.  <<PROCEDUREIMAGES>> FINDINGS:  Normal, patent esophagus. Small hiatal hernia. Some patchy erythema and fibrotic changes of the antral mucosa with overlying bile-stained mucus. No erosion ulcer or infiltrate process. Pylorus patent. Normal first and second portion of the     duodenum  THERAPEUTIC / DIAGNOSTIC MANEUVERS PERFORMED:    Biopsies of gastric antrum and body taken for histologic study  COMPLICATIONS:   None  IMPRESSION:    Small hiatal hernia. Abnormal gastric mucosa of doubtful clinical significance. Status post biopsy to rule  out H. pylori.  This lady has done amazingly well in just the past 2 weeks with probiotic therapy. Hopefully, her improvement will be sustained.  RECOMMENDATIONS:   Continue Restora; followup on pathology.  ______________________________ R. Roetta Sessions, MD Caleen Essex  CC:  n. eSIGNED:   R. Roetta Sessions at 03/15/2011 08:13 AM  Aida Raider, [phone]

## 2011-03-18 ENCOUNTER — Encounter: Payer: Self-pay | Admitting: Internal Medicine

## 2011-03-21 ENCOUNTER — Encounter (HOSPITAL_COMMUNITY): Payer: Self-pay | Admitting: Internal Medicine

## 2011-03-24 ENCOUNTER — Telehealth: Payer: Self-pay | Admitting: Internal Medicine

## 2011-03-24 NOTE — Telephone Encounter (Signed)
Pt called because she is staying really nauseated since having her procedure a week ago Wednesday. She is taking phenergan, but is concerned why this is happening. Please advise. She can be reached on her cell at [phone]

## 2011-03-24 NOTE — Telephone Encounter (Signed)
Spoke with pt- she stated the nausea has been going on for about a year. She said it comes and goes, no vomiting. She has been eating healthy, no grease, no butter. The restora is helping some. She is under a lot of stress right now. She takes prevacid otc prn. Pt has tried aciphex and it seemed to make her symptoms worse. Pt takes phenergan when it gets really bad. Pt wants to know if there is anything she needs to do. She is aware that it may be next week before I have an answer for her, she stated that was ok. Please advise.

## 2011-03-30 NOTE — Telephone Encounter (Signed)
Needs an office visit.

## 2011-03-30 NOTE — Telephone Encounter (Signed)
Susan please schedule appt.  

## 2011-04-03 NOTE — Telephone Encounter (Signed)
Pt is aware of OV on 2/21 at 2pm with [facility]

## 2011-04-06 ENCOUNTER — Ambulatory Visit: Payer: BC Managed Care – PPO | Admitting: Gastroenterology

## 2011-05-11 ENCOUNTER — Encounter: Payer: Self-pay | Admitting: Gastroenterology

## 2011-05-11 ENCOUNTER — Ambulatory Visit (INDEPENDENT_AMBULATORY_CARE_PROVIDER_SITE_OTHER): Payer: BC Managed Care – PPO | Admitting: Gastroenterology

## 2011-05-11 VITALS — BP 131/84 | HR 67 | Temp 97.6°F | Ht 63.0 in | Wt 114.0 lb

## 2011-05-11 DIAGNOSIS — R1013 Epigastric pain: Secondary | ICD-10-CM

## 2011-05-11 DIAGNOSIS — R634 Abnormal weight loss: Secondary | ICD-10-CM

## 2011-05-11 DIAGNOSIS — K529 Noninfective gastroenteritis and colitis, unspecified: Secondary | ICD-10-CM | POA: Insufficient documentation

## 2011-05-11 DIAGNOSIS — R197 Diarrhea, unspecified: Secondary | ICD-10-CM

## 2011-05-11 DIAGNOSIS — K589 Irritable bowel syndrome without diarrhea: Secondary | ICD-10-CM

## 2011-05-11 DIAGNOSIS — R110 Nausea: Secondary | ICD-10-CM

## 2011-05-11 DIAGNOSIS — K3189 Other diseases of stomach and duodenum: Secondary | ICD-10-CM

## 2011-05-11 MED ORDER — PROMETHAZINE HCL 25 MG PO TABS: 25.0000 mg | ORAL_TABLET | Freq: Four times a day (QID) | ORAL | Status: DC | PRN

## 2011-05-11 NOTE — Assessment & Plan Note (Signed)
Chronic dyspepsia. Chronic nausea with intermittent heaving not necessarily related to a meal. Suspect her symptoms are exacerbated by stress as she has acknowledged. She describes anorexia, early satiety. Although weight has been stable over the last 6 months she describes unintentional weight loss of 15-20 pounds prior to this. We will go ahead and document eradication of H. Pylori. Check H. Pylori stool antigen. Check fasting a.m. Cortisol level. Phenergan for now. Once H.Pylori checked will add back PPI as she is having some heartburn now off the meds. Routine labs. Further recommendations to follow. If work-up negative, I advised her she should consider medication/counseling for anxiety/stress.

## 2011-05-11 NOTE — Patient Instructions (Signed)
Please collect your stool test week of April 8th. Do not take antibiotics or Aciphex, etc until this time or it can make the test results inaccurate. If you must take antibiotics we will need to postpone your test.  Please have your blood work done between 7am and 10am any day. You must not eat or drink after midnight.  Have your gastric emptying test done.  Phenergan was sent to [facility].

## 2011-05-11 NOTE — Assessment & Plan Note (Signed)
Stable

## 2011-05-11 NOTE — Progress Notes (Signed)
Primary Care Physician: Colette Ribas, MD, MD  Primary Gastroenterologist:  Roetta Sessions, MD   Chief Complaint  Patient presents with  . Nausea    HPI: Melanie Bradley is a 61 y.o. female here for follow up of recent EGD. She has a history of chronic nausea of at least one year duration. Previously he was having some issues swallowing and epigastric discomfort. States she has lifelong issues of heartburn and pain with eating, "runs in the family". EGD showed small hiatal hernia, vague gastric mucosal findings likely insignificant, gastric biopsy was negative.  Chronic nausea for over one year. ?stress related. Heartburn. Doesn't get hungry. Dry piece of toast at breakfast and nothing rest of day. Stress factor, job related, elderly father. Fairly new job, EMS, Environmental health practitioner. Lives on Coke and crackers. Nocturnal nausea. BM alternate between diarrhea and constipation for years. No melena or rectal bleeding.  She really believes her nausea is related to stress. Symptoms seemed to get worse when she got a new job. Also very bad when her father almost died back in 07-19-2005.  States she does not want to take medication for anxiety/stress on a regular basis.  Current Outpatient Prescriptions  Medication Sig Dispense Refill  . acetaminophen (TYLENOL) 500 MG tablet Take 500 mg by mouth every 6 (six) hours as needed. Takes only one tablet as needed occasionally for headache      . ALPRAZolam (XANAX) 0.25 MG tablet Take 0.25 mg by mouth as needed. For anxiety      . atenolol (TENORMIN) 25 MG tablet Take 25 mg by mouth daily.        . calcium-vitamin D (OSCAL WITH D) 500-200 MG-UNIT per tablet Take 1 tablet by mouth daily.        . fish oil-omega-3 fatty acids 1000 MG capsule Take 1 g by mouth daily.      Marland Kitchen levothyroxine (SYNTHROID, LEVOTHROID) 100 MCG tablet Take 100 mcg by mouth daily.        . meclizine (ANTIVERT) 25 MG tablet Take 25 mg by mouth 3 (three) times daily as needed. For  dizziness      . Probiotic Product (RESTORA) CAPS Take 1 capsule by mouth daily.      . promethazine (PHENERGAN) 25 MG tablet Take 25 mg by mouth every 6 (six) hours as needed. For nausea        Allergies as of 05/11/2011 - Review Complete 05/11/2011  Allergen Reaction Noted  . Benadryl (altaryl)  12/02/2010  . Codeine  07/28/2010  . Penicillins  07/28/2010  . Sulfa antibiotics  07/28/2010    ROS:  General: Negative for weight loss, fever, chills, fatigue, weakness. Loss of appetite ENT: Negative for hoarseness, difficulty swallowing , nasal congestion. CV: Negative for chest pain, angina, palpitations, dyspnea on exertion, peripheral edema.  Respiratory: Negative for dyspnea at rest, dyspnea on exertion, cough, sputum, wheezing.  GI: See history of present illness. GU:  Negative for dysuria, hematuria, urinary incontinence, urinary frequency, nocturnal urination.  Endo: Negative for unusual weight change. Stable the last 6 months. Reports normal tsh at pcp.   Physical Examination:   BP 131/84  Pulse 67  Temp(Src) 97.6 F (36.4 C) (Temporal)  Ht 5\' 3"  (1.6 m)  Wt 114 lb (51.71 kg)  BMI 20.19 kg/m2  General: Well-nourished, well-developed in no acute distress. Accompanied by his spouse Eyes: No icterus. Mouth: Oropharyngeal mucosa moist and pink , no lesions erythema or exudate. Lungs: Clear to auscultation bilaterally.  Heart: Regular rate  and rhythm, no murmurs rubs or gallops.  Abdomen: Bowel sounds are normal, nontender, nondistended, no hepatosplenomegaly or masses, no abdominal bruits or hernia , no rebound or guarding.   Extremities: No lower extremity edema. No clubbing or deformities. Neuro: Alert and oriented x 4   Skin: Warm and dry, no jaundice.   Psych: Alert and cooperative, normal mood and affect.

## 2011-05-11 NOTE — Assessment & Plan Note (Signed)
Likely IBS but r/o celiac.

## 2011-05-15 NOTE — Progress Notes (Signed)
Faxed to PCP

## 2011-05-22 ENCOUNTER — Other Ambulatory Visit (HOSPITAL_COMMUNITY): Payer: Self-pay | Admitting: Family Medicine

## 2011-05-22 DIAGNOSIS — Z139 Encounter for screening, unspecified: Secondary | ICD-10-CM

## 2011-05-27 LAB — COMPREHENSIVE METABOLIC PANEL
Albumin: 4.5 g/dL (ref 3.5–5.2)
BUN: 11 mg/dL (ref 6–23)
CO2: 22 mEq/L (ref 19–32)
Glucose, Bld: 93 mg/dL (ref 70–99)
Sodium: 138 mEq/L (ref 135–145)
Total Bilirubin: 0.4 mg/dL (ref 0.3–1.2)
Total Protein: 7.2 g/dL (ref 6.0–8.3)

## 2011-05-27 LAB — CBC WITH DIFFERENTIAL/PLATELET
Basophils Absolute: 0.1 10*3/uL (ref 0.0–0.1)
HCT: 44.7 % (ref 36.0–46.0)
Lymphocytes Relative: 28 % (ref 12–46)
Lymphs Abs: 2.3 10*3/uL (ref 0.7–4.0)
Neutro Abs: 4.8 10*3/uL (ref 1.7–7.7)
Platelets: 320 10*3/uL (ref 150–400)
RBC: 4.57 MIL/uL (ref 3.87–5.11)
RDW: 15 % (ref 11.5–15.5)
WBC: 8.2 10*3/uL (ref 4.0–10.5)

## 2011-05-27 LAB — HELICOBACTER PYLORI  SPECIAL ANTIGEN: H. PYLORI Antigen: NEGATIVE

## 2011-05-27 LAB — TSH: TSH: 4.558 u[IU]/mL — ABNORMAL HIGH (ref 0.350–4.500)

## 2011-06-02 NOTE — Progress Notes (Signed)
Quick Note:  Please let pt know. Celiac screen neg. Negative adrenal insufficiency screen. CBC, CMET normal. H.Pylori stool antigen negative! TSH slightly elevated. Very minimal. Recommend f/u with PCP for further evaluation. Send copy of labs.   Let's start Nexium 40mg  po daily, 30 mins before breakfast. #31, 11 refills. If persistent UGI symptoms, then consider reevaluation in office. Patient needs to consider seeking stress management help. ______

## 2011-06-05 NOTE — Progress Notes (Signed)
Results Cc to PCP  

## 2011-06-08 NOTE — Progress Notes (Signed)
CBC/CMP/TSH/TTG/H PYLORI STOOL AG NEG  REVIEWED.

## 2011-06-14 NOTE — Progress Notes (Signed)
nexium was called in to [facility].

## 2011-06-16 ENCOUNTER — Ambulatory Visit (HOSPITAL_COMMUNITY)
Admission: RE | Admit: 2011-06-16 | Discharge: 2011-06-16 | Disposition: A | Discharge status: Home / Self Care | Payer: BC Managed Care – PPO | Source: Ambulatory Visit | Attending: Family Medicine | Admitting: Family Medicine

## 2011-06-16 DIAGNOSIS — Z139 Encounter for screening, unspecified: Secondary | ICD-10-CM

## 2011-06-16 DIAGNOSIS — Z1231 Encounter for screening mammogram for malignant neoplasm of breast: Secondary | ICD-10-CM | POA: Insufficient documentation

## 2011-09-29 ENCOUNTER — Other Ambulatory Visit (HOSPITAL_COMMUNITY): Payer: Self-pay | Admitting: Family Medicine

## 2011-09-29 DIAGNOSIS — E039 Hypothyroidism, unspecified: Secondary | ICD-10-CM

## 2011-09-29 DIAGNOSIS — I10 Essential (primary) hypertension: Secondary | ICD-10-CM

## 2011-09-29 DIAGNOSIS — R51 Headache: Secondary | ICD-10-CM

## 2011-10-05 ENCOUNTER — Ambulatory Visit (HOSPITAL_COMMUNITY)
Admission: RE | Admit: 2011-10-05 | Discharge: 2011-10-05 | Disposition: A | Discharge status: Home / Self Care | Payer: BC Managed Care – PPO | Source: Ambulatory Visit | Attending: Family Medicine | Admitting: Family Medicine

## 2011-10-05 ENCOUNTER — Other Ambulatory Visit (HOSPITAL_COMMUNITY): Payer: BC Managed Care – PPO

## 2011-10-05 DIAGNOSIS — E039 Hypothyroidism, unspecified: Secondary | ICD-10-CM

## 2011-10-05 DIAGNOSIS — F17200 Nicotine dependence, unspecified, uncomplicated: Secondary | ICD-10-CM | POA: Insufficient documentation

## 2011-10-05 DIAGNOSIS — R209 Unspecified disturbances of skin sensation: Secondary | ICD-10-CM | POA: Insufficient documentation

## 2011-10-05 DIAGNOSIS — I10 Essential (primary) hypertension: Secondary | ICD-10-CM

## 2011-10-05 DIAGNOSIS — R51 Headache: Secondary | ICD-10-CM

## 2012-06-10 ENCOUNTER — Other Ambulatory Visit (HOSPITAL_COMMUNITY): Payer: Self-pay | Admitting: Family Medicine

## 2012-06-10 DIAGNOSIS — Z139 Encounter for screening, unspecified: Secondary | ICD-10-CM

## 2012-06-18 ENCOUNTER — Ambulatory Visit (HOSPITAL_COMMUNITY)
Admission: RE | Admit: 2012-06-18 | Discharge: 2012-06-18 | Disposition: A | Discharge status: Home / Self Care | Payer: BC Managed Care – PPO | Source: Ambulatory Visit | Attending: Family Medicine | Admitting: Family Medicine

## 2012-06-18 DIAGNOSIS — Z139 Encounter for screening, unspecified: Secondary | ICD-10-CM

## 2012-06-18 DIAGNOSIS — Z1231 Encounter for screening mammogram for malignant neoplasm of breast: Secondary | ICD-10-CM | POA: Insufficient documentation

## 2012-06-25 ENCOUNTER — Other Ambulatory Visit: Payer: Self-pay | Admitting: Gastroenterology

## 2012-06-26 NOTE — Telephone Encounter (Signed)
We have not seen her in over one year. Can get refill from PCP or ov here.

## 2012-07-03 ENCOUNTER — Other Ambulatory Visit (HOSPITAL_COMMUNITY): Payer: Self-pay | Admitting: *Deleted

## 2012-07-03 ENCOUNTER — Other Ambulatory Visit: Payer: Self-pay | Admitting: Gastroenterology

## 2012-07-03 DIAGNOSIS — R0989 Other specified symptoms and signs involving the circulatory and respiratory systems: Secondary | ICD-10-CM

## 2012-07-04 ENCOUNTER — Encounter: Payer: Self-pay | Admitting: Internal Medicine

## 2012-07-04 NOTE — Telephone Encounter (Signed)
Mailed letter to patient that she needs to make OV to further her RF

## 2012-07-04 NOTE — Telephone Encounter (Signed)
Pt needs follow-up. I have refilled phenergan X 1.

## 2012-07-09 ENCOUNTER — Ambulatory Visit (HOSPITAL_COMMUNITY)
Admission: RE | Admit: 2012-07-09 | Discharge: 2012-07-09 | Disposition: A | Discharge status: Home / Self Care | Payer: BC Managed Care – PPO | Source: Ambulatory Visit | Attending: Family Medicine | Admitting: Family Medicine

## 2012-07-09 DIAGNOSIS — R0989 Other specified symptoms and signs involving the circulatory and respiratory systems: Secondary | ICD-10-CM | POA: Insufficient documentation

## 2012-10-02 ENCOUNTER — Telehealth: Payer: Self-pay | Admitting: *Deleted

## 2012-10-02 ENCOUNTER — Ambulatory Visit: Payer: BC Managed Care – PPO | Admitting: Gastroenterology

## 2012-10-02 NOTE — Telephone Encounter (Signed)
No show

## 2013-05-08 ENCOUNTER — Encounter (HOSPITAL_COMMUNITY): Payer: Self-pay | Admitting: Emergency Medicine

## 2013-05-08 ENCOUNTER — Observation Stay (HOSPITAL_COMMUNITY)
Admission: EM | Admit: 2013-05-08 | Discharge: 2013-05-09 | Disposition: A | Discharge status: Home / Self Care | Payer: BC Managed Care – PPO | Attending: Internal Medicine | Admitting: Internal Medicine

## 2013-05-08 ENCOUNTER — Emergency Department (HOSPITAL_COMMUNITY): Payer: BC Managed Care – PPO

## 2013-05-08 DIAGNOSIS — I447 Left bundle-branch block, unspecified: Secondary | ICD-10-CM | POA: Diagnosis present

## 2013-05-08 DIAGNOSIS — Z8619 Personal history of other infectious and parasitic diseases: Secondary | ICD-10-CM | POA: Insufficient documentation

## 2013-05-08 DIAGNOSIS — F17200 Nicotine dependence, unspecified, uncomplicated: Secondary | ICD-10-CM | POA: Insufficient documentation

## 2013-05-08 DIAGNOSIS — K219 Gastro-esophageal reflux disease without esophagitis: Secondary | ICD-10-CM | POA: Insufficient documentation

## 2013-05-08 DIAGNOSIS — R0789 Other chest pain: Principal | ICD-10-CM | POA: Insufficient documentation

## 2013-05-08 DIAGNOSIS — Z720 Tobacco use: Secondary | ICD-10-CM | POA: Diagnosis present

## 2013-05-08 DIAGNOSIS — K449 Diaphragmatic hernia without obstruction or gangrene: Secondary | ICD-10-CM | POA: Insufficient documentation

## 2013-05-08 DIAGNOSIS — K2940 Chronic atrophic gastritis without bleeding: Secondary | ICD-10-CM | POA: Insufficient documentation

## 2013-05-08 DIAGNOSIS — R110 Nausea: Secondary | ICD-10-CM | POA: Insufficient documentation

## 2013-05-08 DIAGNOSIS — K649 Unspecified hemorrhoids: Secondary | ICD-10-CM | POA: Insufficient documentation

## 2013-05-08 DIAGNOSIS — K5730 Diverticulosis of large intestine without perforation or abscess without bleeding: Secondary | ICD-10-CM | POA: Insufficient documentation

## 2013-05-08 DIAGNOSIS — J449 Chronic obstructive pulmonary disease, unspecified: Secondary | ICD-10-CM

## 2013-05-08 DIAGNOSIS — I10 Essential (primary) hypertension: Secondary | ICD-10-CM | POA: Diagnosis present

## 2013-05-08 DIAGNOSIS — R42 Dizziness and giddiness: Secondary | ICD-10-CM | POA: Insufficient documentation

## 2013-05-08 DIAGNOSIS — Z79899 Other long term (current) drug therapy: Secondary | ICD-10-CM | POA: Insufficient documentation

## 2013-05-08 DIAGNOSIS — R079 Chest pain, unspecified: Secondary | ICD-10-CM | POA: Diagnosis present

## 2013-05-08 DIAGNOSIS — I4892 Unspecified atrial flutter: Secondary | ICD-10-CM | POA: Diagnosis not present

## 2013-05-08 DIAGNOSIS — K589 Irritable bowel syndrome without diarrhea: Secondary | ICD-10-CM | POA: Insufficient documentation

## 2013-05-08 DIAGNOSIS — Z880 Allergy status to penicillin: Secondary | ICD-10-CM | POA: Insufficient documentation

## 2013-05-08 LAB — BASIC METABOLIC PANEL
BUN: 8 mg/dL (ref 6–23)
CO2: 31 meq/L (ref 19–32)
Calcium: 9.6 mg/dL (ref 8.4–10.5)
Chloride: 85 mEq/L — ABNORMAL LOW (ref 96–112)
Creatinine, Ser: 0.7 mg/dL (ref 0.50–1.10)
GFR calc Af Amer: >90 mL/min (ref >90)
GFR calc non Af Amer: >90 mL/min (ref >90)
Glucose, Bld: 109 mg/dL — ABNORMAL HIGH (ref 70–99)
Potassium: 3.6 mEq/L — ABNORMAL LOW (ref 3.7–5.3)
SODIUM: 129 meq/L — AB (ref 137–147)

## 2013-05-08 LAB — I-STAT TROPONIN, ED: TROPONIN I, POC: 0.01 ng/mL (ref 0.00–0.08)

## 2013-05-08 LAB — DIFFERENTIAL
BASOS ABS: 0.1 10*3/uL (ref 0.0–0.1)
Basophils Relative: 1 % (ref 0–1)
EOS PCT: 1 % (ref 0–5)
Eosinophils Absolute: 0.1 10*3/uL (ref 0.0–0.7)
LYMPHS PCT: 20 % (ref 12–46)
Lymphs Abs: 1.7 10*3/uL (ref 0.7–4.0)
Monocytes Absolute: 1 10*3/uL (ref 0.1–1.0)
Monocytes Relative: 11 % (ref 3–12)
NEUTROS PCT: 67 % (ref 43–77)
Neutro Abs: 5.8 10*3/uL (ref 1.7–7.7)

## 2013-05-08 LAB — CBC
HCT: 38.4 % (ref 36.0–46.0)
Hemoglobin: 13.7 g/dL (ref 12.0–15.0)
MCH: 32 pg (ref 26.0–34.0)
MCHC: 35.7 g/dL (ref 30.0–36.0)
MCV: 89.7 fL (ref 78.0–100.0)
PLATELETS: 337 10*3/uL (ref 150–400)
RBC: 4.28 MIL/uL (ref 3.87–5.11)
RDW: 12.7 % (ref 11.5–15.5)
WBC: 8.5 10*3/uL (ref 4.0–10.5)

## 2013-05-08 LAB — TROPONIN I: Troponin I: <0.3 ng/mL (ref <0.30)

## 2013-05-08 MED ORDER — ALPRAZOLAM 0.25 MG PO TABS: 0.2500 mg | ORAL_TABLET | Freq: Three times a day (TID) | ORAL | Status: DC | PRN

## 2013-05-08 MED ORDER — ASPIRIN EC 81 MG PO TBEC
81.0000 mg | DELAYED_RELEASE_TABLET | Freq: Every day | ORAL | Status: DC
  Administered 2013-05-09: 81 mg via ORAL
  Filled 2013-05-08: qty 1

## 2013-05-08 MED ORDER — LOSARTAN POTASSIUM 50 MG PO TABS
50.0000 mg | ORAL_TABLET | Freq: Every day | ORAL | Status: DC
  Administered 2013-05-08: 50 mg via ORAL
  Filled 2013-05-08 (×2): qty 1

## 2013-05-08 MED ORDER — ASPIRIN 81 MG PO CHEW
324.0000 mg | CHEWABLE_TABLET | Freq: Once | ORAL | Status: AC
  Administered 2013-05-08: 324 mg via ORAL
  Filled 2013-05-08: qty 4

## 2013-05-08 MED ORDER — ATORVASTATIN CALCIUM 20 MG PO TABS
20.0000 mg | ORAL_TABLET | Freq: Every day | ORAL | Status: DC
  Administered 2013-05-08: 20 mg via ORAL
  Filled 2013-05-08 (×2): qty 1

## 2013-05-08 MED ORDER — METOPROLOL TARTRATE 12.5 MG HALF TABLET
12.5000 mg | ORAL_TABLET | Freq: Two times a day (BID) | ORAL | Status: DC
Start: 2013-05-08 — End: 2013-05-09
  Administered 2013-05-08 – 2013-05-09 (×2): 12.5 mg via ORAL
  Filled 2013-05-08 (×3): qty 1

## 2013-05-08 MED ORDER — LEVOTHYROXINE SODIUM 100 MCG PO TABS
100.0000 ug | ORAL_TABLET | Freq: Every day | ORAL | Status: DC
  Administered 2013-05-08: 100 ug via ORAL
  Filled 2013-05-08 (×2): qty 1

## 2013-05-08 MED ORDER — PROMETHAZINE HCL 25 MG PO TABS: 25.0000 mg | ORAL_TABLET | Freq: Four times a day (QID) | ORAL | Status: DC | PRN

## 2013-05-08 MED ORDER — ALPRAZOLAM 0.25 MG PO TABS: 0.2500 mg | ORAL_TABLET | ORAL | Status: DC | PRN

## 2013-05-08 MED ORDER — NITROGLYCERIN 0.4 MG SL SUBL: 0.4000 mg | SUBLINGUAL_TABLET | SUBLINGUAL | Status: DC | PRN

## 2013-05-08 NOTE — ED Notes (Signed)
Pt ambulatory to bathroom without any problems 

## 2013-05-08 NOTE — ED Provider Notes (Signed)
CSN: [id number]     Arrival date & time 05/08/13  1427 History   First MD Initiated Contact with Patient 05/08/13 1515     Chief Complaint  Patient presents with  . abnormal EKG      (Consider location/radiation/quality/duration/timing/severity/associated sxs/prior Treatment) HPI Comments: Female presenting from her primary care office secondary to a new left bundle branch block seen on her EKG today. She endorses that for the past week she's had some intermittent chest pain, which she relates to anxiety.  Patient is a 63 y.o. female presenting with chest pain.  Chest Pain Pain location:  Substernal area Pain quality: tightness   Pain radiates to:  Does not radiate Pain severity:  Moderate Onset quality:  Sudden Duration: Patient unable to describe the duration of her symptoms, other than that they are brief. Timing:  Intermittent Progression:  Unchanged Chronicity:  New Context: stress   Context comment:  Had episode today while she was at her primary doctor's office which she relates to the stress of hearing the results of her EKG Relieved by: xanax, rubbing her chest. Worsened by:  Nothing tried Associated symptoms: dizziness and nausea   Associated symptoms: no abdominal pain, no diaphoresis, no shortness of breath and not vomiting     Past Medical History  Diagnosis Date  . Hypertension   . Epigastric burning sensation   . Bloating   . Spastic colon   . Diverticula of colon   . Hiatal hernia 06/26/03    Dr. Laural Golden egd  . Hemorrhoids 06/26/03    Dr. Laural Golden tcs  . GERD (gastroesophageal reflux disease)   . Headache(784.0)   . Helicobacter pylori gastritis 2004   Past Surgical History  Procedure Laterality Date  . Upper gastrointestinal endoscopy  06/26/03    Dr. Wynelle Bourgeois sliding hiatal hernia with 8 mm tongue of gastric type mucosa at distal esophagus bile in the stomach.  . Colonoscopy  06/26/03    Dr. Kem Boroughs diverticula at the sigmoid colon with one  of the transverse colon, normal terminal ileoscopy, small external hemorrhoids the  . Cholecystectomy    . Appendectomy    . Tubal ligation    . Esophagogastroduodenoscopy  03/15/2011    Dr. Trevor Iha hiatal hernia, abnormal gastric mucosa of unclear significance. Gastric biopsies negative, no H. pylori.Procedure: ESOPHAGOGASTRODUODENOSCOPY (EGD);  Surgeon: Daneil Dolin, MD;  Location: [facility];  Service: Endoscopy;  Laterality: N/A;  7:30   Family History  Problem Relation Age of Onset  . Hypertension Mother   . COPD Father   . Heart failure Father   . Cancer Sister   . Cardiomyopathy Brother   . Pneumonia Other    History  Substance Use Topics  . Smoking status: Current Every Day Smoker -- 0.50 packs/day for 25 years    Types: Cigarettes  . Smokeless tobacco: Not on file     Comment: Smokes 6 cigarettes daily  . Alcohol Use: No   OB History   Grav Para Term Preterm Abortions TAB SAB Ect Mult Living                 Review of Systems  Constitutional: Negative for diaphoresis.  Respiratory: Negative for shortness of breath.   Cardiovascular: Positive for chest pain.  Gastrointestinal: Positive for nausea. Negative for vomiting and abdominal pain.  Neurological: Positive for dizziness.  All other systems reviewed and are negative.      Allergies  Benadryl; Codeine; Penicillins; and Sulfa antibiotics  Home Medications  Current Outpatient Rx  Name  Route  Sig  Dispense  Refill  . acetaminophen (TYLENOL) 500 MG tablet   Oral   Take 500 mg by mouth every 6 (six) hours as needed. Takes only one tablet as needed occasionally for headache         . ALPRAZolam (XANAX) 0.25 MG tablet   Oral   Take 0.25 mg by mouth as needed. For anxiety         . atenolol (TENORMIN) 25 MG tablet   Oral   Take 25 mg by mouth daily.           . calcium-vitamin D (OSCAL WITH D) 500-200 MG-UNIT per tablet   Oral   Take 1 tablet by mouth daily.           . fish  oil-omega-3 fatty acids 1000 MG capsule   Oral   Take 1 g by mouth daily.         Marland Kitchen levothyroxine (SYNTHROID, LEVOTHROID) 100 MCG tablet   Oral   Take 100 mcg by mouth daily.           . meclizine (ANTIVERT) 25 MG tablet   Oral   Take 25 mg by mouth 3 (three) times daily as needed. For dizziness         . Probiotic Product (RESTORA) CAPS   Oral   Take 1 capsule by mouth daily.         . promethazine (PHENERGAN) 25 MG tablet      TAKE 1 TABLET EVERY 6 HOURS AS NEEDED FOR NAUSEA AND VOMITING.   30 tablet   0    BP 138/121  Pulse 105  Temp(Src) 98.8 F (37.1 C) (Oral)  Resp 20  SpO2 99% Physical Exam  Nursing note and vitals reviewed. Constitutional: She is oriented to person, place, and time. She appears well-developed and well-nourished. No distress.  HENT:  Head: Normocephalic and atraumatic.  Mouth/Throat: Oropharynx is clear and moist.  Eyes: Conjunctivae are normal. Pupils are equal, round, and reactive to light. No scleral icterus.  Neck: Neck supple.  Cardiovascular: Normal rate, regular rhythm, normal heart sounds and intact distal pulses.   No murmur heard. Pulmonary/Chest: Effort normal and breath sounds normal. No stridor. No respiratory distress. She has no rales.  Abdominal: Soft. Bowel sounds are normal. She exhibits no distension. There is no tenderness.  Musculoskeletal: Normal range of motion.  Neurological: She is alert and oriented to person, place, and time.  Skin: Skin is warm and dry. No rash noted.  Psychiatric: She has a normal mood and affect. Her behavior is normal.    ED Course  Procedures (including critical care time) Labs Review Labs Reviewed  BASIC METABOLIC PANEL - Abnormal; Notable for the following:    Sodium 129 (*)    Potassium 3.6 (*)    Chloride 85 (*)    Glucose, Bld 109 (*)    All other components within normal limits  LIPID PANEL - Abnormal; Notable for the following:    LDL Cholesterol 101 (*)    All other  components within normal limits  BASIC METABOLIC PANEL - Abnormal; Notable for the following:    Sodium 131 (*)    Potassium 3.6 (*)    Chloride 87 (*)    All other components within normal limits  CBC  DIFFERENTIAL  TROPONIN I  TROPONIN I  TSH  TROPONIN I  I-STAT TROPOININ, ED   Imaging Review Dg Chest 2 View  05/08/2013   CLINICAL DATA:  Chest discomfort; l ong-term tobacco use  EXAM: CHEST  2 VIEW  COMPARISON:  DG CHEST 2 VIEW dated 11/04/2010  FINDINGS: The lungs are mildly hyperinflated but clear. The cardiac silhouette is normal in size. There is no pleural effusion or pneumothorax. The mediastinum is normal in width. The trachea is midline. The pulmonary vascularity is not engorged. The observed portions of the bony thorax exhibit no acute abnormalities.  IMPRESSION: There is mild hyperinflation which likely reflects COPD. There is no evidence of active cardiopulmonary disease.   Electronically Signed   By: David  Martinique   On: 05/08/2013 16:01  All radiology studies independently viewed by me.      EKG Interpretation   Date/Time:  Thursday May 08 2013 14:38:30 EDT Ventricular Rate:  100 PR Interval:  182 QRS Duration: 136 QT Interval:  398 QTC Calculation: 513 R Axis:   23 Text Interpretation:  Normal sinus rhythm Right atrial enlargement Left  bundle branch block Abnormal ECG No old tracing to compare Confirmed by  [facility]  MD, TREY ([id number]) on 05/08/2013 3:17:27 PM      MDM   Final diagnosis: Chest pain Left Bundle Branch Block   Well-appearing 63-year-old female with intermittent chest pain for about a week and a left bundle branch block seen on her EKG at her primary doctor's office today. Last EKG was in 2012 and showed normal conduction. No pain currently. Will talk with cardiology.  Cardiology to admit.    Houston [name] III, MD 05/09/13 1154

## 2013-05-08 NOTE — ED Notes (Signed)
Pt continues to deny any chest pain or other complaints.

## 2013-05-08 NOTE — H&P (Signed)
Personally seen and examined, agree with above.  Intermittent atypical chest pain with intermittent left bundle branch block newly detected. Discussed with her observation overnight, pharmacologic stress test in the morning. She is hesitant but willing to proceed. Her husband is encouraging her. She walks daily. She has lost a tremendous amount of weight. Excellent. She may need further manipulation of her antihypertensives. Sodium and potassium abnormality, could this be HCTZ? Supposedly, she has been off of this medication for some time. Continue to monitor. Hopeful discharge tomorrow after stress test. Continue to advocate for tobacco cessation.

## 2013-05-08 NOTE — ED Notes (Signed)
Pt to Xray at this time

## 2013-05-08 NOTE — H&P (Signed)
Cardiologist: Melanie Bradley is an 63 y.o. female.   Chief Complaint: Chest pain HPI:   The patient is a 63 year old female with a history of Tobacco abuse, Obesity, hypertension, Hypothyroidism, GERD, hiatal hernia, Helicobacter pylori gastritis.  She has lost ~175 pounds in the the last tens years.  Her father past away last year at the age of 80 and she gets emotional about it.   Her mother had problems with angina.   She presents today at the request of her PCP, who has recently been adjusting her BP meds, because she has a new LBBB on her EKG.  She was taking atenolol for years and he changed her to losartan/ HCTZ.  This made her dizzy so she is just on losartan now.  Since being off atenolol for about 10 days she has noticed intermittent chest tightness which does not appear to be affected by exertion.  She has had chronic nausea for years and takes phenergan.  The patient currently denies vomiting, fever, shortness of breath, orthopnea, dizziness, PND, cough, congestion, abdominal pain, hematochezia, melena, lower extremity edema, claudication.  Medications: Prior to Admission medications   Medication Sig Start Date End Date Taking? Authorizing Provider  acetaminophen (TYLENOL) 500 MG tablet Take 500 mg by mouth daily as needed for headache.    Yes Historical Provider, MD  ALPRAZolam Duanne Moron) 0.25 MG tablet Take 0.25 mg by mouth as needed for anxiety.    Yes Historical Provider, MD  calcium-vitamin D (OSCAL WITH D) 500-200 MG-UNIT per tablet Take 1 tablet by mouth daily.     Yes Historical Provider, MD  levothyroxine (SYNTHROID, LEVOTHROID) 100 MCG tablet Take 100 mcg by mouth at bedtime.   Yes Historical Provider, MD  losartan (COZAAR) 50 MG tablet Take 50 mg by mouth at bedtime.   Yes Historical Provider, MD  meclizine (ANTIVERT) 25 MG tablet Take 25 mg by mouth daily as needed (for vertigo). For dizziness   Yes Historical Provider, MD  promethazine (PHENERGAN) 25 MG tablet Take 25  mg by mouth every 6 (six) hours as needed for nausea or vomiting.   Yes Historical Provider, MD     Past Medical History  Diagnosis Date  . Hypertension   . Epigastric burning sensation   . Bloating   . Spastic colon   . Diverticula of colon   . Hiatal hernia 06/26/03    Dr. Laural Golden egd  . Hemorrhoids 06/26/03    Dr. Laural Golden tcs  . GERD (gastroesophageal reflux disease)   . Headache(784.0)   . Helicobacter pylori gastritis 2004    Past Surgical History  Procedure Laterality Date  . Upper gastrointestinal endoscopy  06/26/03    Dr. Wynelle Bourgeois sliding hiatal hernia with 8 mm tongue of gastric type mucosa at distal esophagus bile in the stomach.  . Colonoscopy  06/26/03    Dr. Kem Boroughs diverticula at the sigmoid colon with one of the transverse colon, normal terminal ileoscopy, small external hemorrhoids the  . Cholecystectomy    . Appendectomy    . Tubal ligation    . Esophagogastroduodenoscopy  03/15/2011    Dr. Trevor Iha hiatal hernia, abnormal gastric mucosa of unclear significance. Gastric biopsies negative, no H. pylori.Procedure: ESOPHAGOGASTRODUODENOSCOPY (EGD);  Surgeon: Daneil Dolin, MD;  Location: [facility];  Service: Endoscopy;  Laterality: N/A;  7:30    Family History  Problem Relation Age of Onset  . Hypertension Mother   . COPD Father   . Heart failure Father   . Cancer  Sister   . Cardiomyopathy Brother   . Pneumonia Other   . Angina Mother    Social History:  reports that she has been smoking Cigarettes.  She has a 12.5 pack-year smoking history. She does not have any smokeless tobacco history on file. She reports that she does not drink alcohol or use illicit drugs.  Allergies:  Allergies  Allergen Reactions  . Benadryl [Diphenhydramine Hcl] Hives  . Codeine Other (See Comments)    Refuses to take  . Penicillins Hives and Swelling  . Sulfa Antibiotics Hives     (Not in a hospital admission)  Results for orders placed during the  hospital encounter of 05/08/13 (from the past 48 hour(s))  CBC     Status: None   Collection Time    05/08/13  2:47 PM      Result Value Ref Range   WBC 8.5  4.0 - 10.5 K/uL   RBC 4.28  3.87 - 5.11 MIL/uL   Hemoglobin 13.7  12.0 - 15.0 g/dL   HCT 38.4  36.0 - 46.0 %   MCV 89.7  78.0 - 100.0 fL   MCH 32.0  26.0 - 34.0 pg   MCHC 35.7  30.0 - 36.0 g/dL   RDW 12.7  11.5 - 15.5 %   Platelets 337  150 - 400 K/uL  DIFFERENTIAL     Status: None   Collection Time    05/08/13  2:47 PM      Result Value Ref Range   Neutrophils Relative % 67  43 - 77 %   Neutro Abs 5.8  1.7 - 7.7 K/uL   Lymphocytes Relative 20  12 - 46 %   Lymphs Abs 1.7  0.7 - 4.0 K/uL   Monocytes Relative 11  3 - 12 %   Monocytes Absolute 1.0  0.1 - 1.0 K/uL   Eosinophils Relative 1  0 - 5 %   Eosinophils Absolute 0.1  0.0 - 0.7 K/uL   Basophils Relative 1  0 - 1 %   Basophils Absolute 0.1  0.0 - 0.1 K/uL  BASIC METABOLIC PANEL     Status: Abnormal   Collection Time    05/08/13  2:47 PM      Result Value Ref Range   Sodium 129 (*) 137 - 147 mEq/L   Potassium 3.6 (*) 3.7 - 5.3 mEq/L   Chloride 85 (*) 96 - 112 mEq/L   CO2 31  19 - 32 mEq/L   Glucose, Bld 109 (*) 70 - 99 mg/dL   BUN 8  6 - 23 mg/dL   Creatinine, Ser 0.70  0.50 - 1.10 mg/dL   Calcium 9.6  8.4 - 10.5 mg/dL   GFR calc non Af Amer >90  >90 mL/min   GFR calc Af Amer >90  >90 mL/min   Comment: (NOTE)     The eGFR has been calculated using the CKD EPI equation.     This calculation has not been validated in all clinical situations.     eGFR's persistently <90 mL/min signify possible Chronic Kidney     Disease.  Randolm Idol, ED     Status: None   Collection Time    05/08/13  3:16 PM      Result Value Ref Range   Troponin i, poc 0.01  0.00 - 0.08 ng/mL   Comment 3            Comment: Due to the release kinetics of cTnI,  a negative result within the first hours     of the onset of symptoms does not rule out     myocardial infarction with  certainty.     If myocardial infarction is still suspected,     repeat the test at appropriate intervals.   Dg Chest 2 View  05/08/2013   CLINICAL DATA:  Chest discomfort; l ong-term tobacco use  EXAM: CHEST  2 VIEW  COMPARISON:  DG CHEST 2 VIEW dated 11/04/2010  FINDINGS: The lungs are mildly hyperinflated but clear. The cardiac silhouette is normal in size. There is no pleural effusion or pneumothorax. The mediastinum is normal in width. The trachea is midline. The pulmonary vascularity is not engorged. The observed portions of the bony thorax exhibit no acute abnormalities.  IMPRESSION: There is mild hyperinflation which likely reflects COPD. There is no evidence of active cardiopulmonary disease.   Electronically Signed   By: David  Martinique   On: 05/08/2013 16:01    Review of Systems  Constitutional: Negative for fever and diaphoresis.  HENT: Negative for congestion and sore throat.   Respiratory: Negative for cough, shortness of breath and wheezing.   Cardiovascular: Positive for chest pain. Negative for palpitations, orthopnea, claudication, leg swelling and PND.  Gastrointestinal: Positive for nausea (Chronic). Negative for vomiting, abdominal pain, blood in stool and melena.  Genitourinary: Negative for hematuria.  Musculoskeletal: Negative for myalgias.  Neurological: Negative for dizziness.  All other systems reviewed and are negative.    Blood pressure 138/121, pulse 105, temperature 98.8 F (37.1 C), temperature source Oral, resp. rate 20, SpO2 99.00%. Physical Exam  Nursing note and vitals reviewed. Constitutional: She is oriented to person, place, and time. She appears well-developed and well-nourished. No distress.  HENT:  Head: Normocephalic and atraumatic.  Mouth/Throat: Oropharynx is clear and moist. No oropharyngeal exudate.  Eyes: EOM are normal. Pupils are equal, round, and reactive to light. No scleral icterus.  Neck: Normal range of motion. Neck supple. No JVD  present.  Cardiovascular: Normal rate, regular rhythm, S1 normal and S2 normal.   No murmur heard. Pulses:      Radial pulses are 2+ on the right side, and 2+ on the left side.       Dorsalis pedis pulses are 2+ on the right side, and 2+ on the left side.  No Carotid Bruits  Respiratory: Effort normal and breath sounds normal. She has no wheezes. She has no rales.  GI: Soft. Bowel sounds are normal. She exhibits no distension. There is no tenderness.  Musculoskeletal: She exhibits no edema.  Lymphadenopathy:    She has no cervical adenopathy.  Neurological: She is alert and oriented to person, place, and time.  Skin: Skin is warm and dry.  Psychiatric: She has a normal mood and affect.     Assessment/Plan Principal Problem:   LBBB (left bundle branch block)  Repeat EKG shows resolution of the block. Active Problems:   Chest pain Will admit to tele.  Cycle troponin.  She was given four baby ASA.  Check lipids, TSH.  Add a statin.  Will schedule lexiscan myovue for tomorrow.   Hypertension  Will restart a beta blocker.  Lopressor 12.88m BID.   Chronic nausea  Continue phenergan for nausea   Tobacco abuse    ,  05/08/2013, 4:52 PM

## 2013-05-08 NOTE — ED Notes (Signed)
Pt eating [place] sandwich at this time.  Husband remains at bedside.

## 2013-05-08 NOTE — ED Notes (Signed)
Pt reports intermittent BP issues with some low readings. Medication change to losartan/hctz 100/25mg  ~2 weeks ago. PT reduced losartan dose to 50mg  and stopped HCTZ with low BP. Began having intermittent chest pressure over the past few days taking xanax for relief assuming it was anxiety. PT was sent from MD office for abnormal EKG with T wave inversion and bundle branch block. Pt also states she's been fatigued since the medication change from Tenormin to Losartan. Denies SOB, n/v, diaphoresis.

## 2013-05-08 NOTE — ED Notes (Signed)
Pt st's she is not having any chest pain at this time.  St's pain comes and goes but only last very short period of time.  Cardiology PA at bedside assessing pt at this time.

## 2013-05-09 ENCOUNTER — Observation Stay (HOSPITAL_COMMUNITY): Payer: BC Managed Care – PPO

## 2013-05-09 ENCOUNTER — Other Ambulatory Visit: Payer: Self-pay

## 2013-05-09 DIAGNOSIS — I4892 Unspecified atrial flutter: Secondary | ICD-10-CM | POA: Diagnosis not present

## 2013-05-09 DIAGNOSIS — R079 Chest pain, unspecified: Secondary | ICD-10-CM

## 2013-05-09 DIAGNOSIS — J449 Chronic obstructive pulmonary disease, unspecified: Secondary | ICD-10-CM

## 2013-05-09 LAB — LIPID PANEL
Cholesterol: 173 mg/dL (ref 0–200)
HDL: 55 mg/dL (ref >39)
LDL CALC: 101 mg/dL — AB (ref 0–99)
Total CHOL/HDL Ratio: 3.1 RATIO
Triglycerides: 83 mg/dL (ref <150)
VLDL: 17 mg/dL (ref 0–40)

## 2013-05-09 LAB — TSH: TSH: 1.514 u[IU]/mL (ref 0.350–4.500)

## 2013-05-09 LAB — TROPONIN I: Troponin I: <0.3 ng/mL (ref <0.30)

## 2013-05-09 LAB — BASIC METABOLIC PANEL
BUN: 8 mg/dL (ref 6–23)
CALCIUM: 9.2 mg/dL (ref 8.4–10.5)
CHLORIDE: 87 meq/L — AB (ref 96–112)
CO2: 28 meq/L (ref 19–32)
Creatinine, Ser: 0.65 mg/dL (ref 0.50–1.10)
GFR calc Af Amer: >90 mL/min (ref >90)
GFR calc non Af Amer: >90 mL/min (ref >90)
GLUCOSE: 97 mg/dL (ref 70–99)
Potassium: 3.6 mEq/L — ABNORMAL LOW (ref 3.7–5.3)
SODIUM: 131 meq/L — AB (ref 137–147)

## 2013-05-09 MED ORDER — ASPIRIN 81 MG PO TBEC: 81.0000 mg | DELAYED_RELEASE_TABLET | Freq: Every day | ORAL | Status: DC

## 2013-05-09 MED ORDER — TECHNETIUM TC 99M SESTAMIBI - CARDIOLITE
30.0000 | Freq: Once | INTRAVENOUS | Status: AC | PRN
  Administered 2013-05-09: 11:00:00 30 via INTRAVENOUS

## 2013-05-09 MED ORDER — METOPROLOL TARTRATE 12.5 MG HALF TABLET: 12.5000 mg | ORAL_TABLET | Freq: Two times a day (BID) | ORAL | Status: DC

## 2013-05-09 MED ORDER — ATORVASTATIN CALCIUM 20 MG PO TABS: 20.0000 mg | ORAL_TABLET | Freq: Every day | ORAL | Status: DC

## 2013-05-09 MED ORDER — REGADENOSON 0.4 MG/5ML IV SOLN
INTRAVENOUS | Status: AC
  Filled 2013-05-09: qty 5

## 2013-05-09 MED ORDER — TECHNETIUM TC 99M SESTAMIBI GENERIC - CARDIOLITE
10.0000 | Freq: Once | INTRAVENOUS | Status: AC | PRN
  Administered 2013-05-09: 10 via INTRAVENOUS

## 2013-05-09 MED ORDER — REGADENOSON 0.4 MG/5ML IV SOLN
0.4000 mg | Freq: Once | INTRAVENOUS | Status: AC
  Administered 2013-05-09: 0.4 mg via INTRAVENOUS

## 2013-05-09 NOTE — Progress Notes (Signed)
Utilization review completed.  

## 2013-05-09 NOTE — Discharge Summary (Signed)
Patient ID: Melanie Bradley,  MRN: [id number], DOB/AGE: 61-Jun-1952 61 y.o.  Admit date: 05/08/2013 Discharge date: 05/09/2013  Primary Care Provider: Dr Hilma Favors Primary Cardiologist: [facility] (new)  Discharge Diagnoses Principal Problem:   LBBB (left bundle branch block)- intermittent Active Problems:   Chest pain   Atrial flutter   Hypertension   Chronic nausea   Tobacco abuse   COPD (chronic obstructive pulmonary disease)    Procedures: Lexsican Myoview 05/09/13   Hospital Course: The patient is a 63 year old female with a history of tobacco abuse, prior obesity, hypertension, hypothyroidism, GERD, hiatal hernia, and Helicobacter pylori gastritis. She has lost ~175 pounds in the the last tens years. She was recently taken off beta blocker and placed on ARB for HTN. She presented to her primary care provider's office 05/08/13 with complaints of epigastric pain. She had an abnormal EKG with intermittent LBBB and was sent to [facility] for further evaluation.  She ruled out for an MI. Lexiscan Myoview done 05/09/13 was remarkable for her heart rate going from 78 with narrow complex QRS to150 with a wide complex QRS. This was review by Dr Ron Parker and he feels it is sinus tachycardia with rate related LBBB. Her images were negative for ischemia. We discharged her 05/09/13 pm on Lopressor 12.5 mg BID, ASA 81 mg, and Lipitor 20 mg daily in addition to her home medications. She lives in [place] and would prefer to follow up in the [place] office.    Discharge Vitals:  Blood pressure 123/73, pulse 114, temperature 98.5 F (36.9 C), temperature source Oral, resp. rate 18, height $RemoveBe'5\' 2"'rDinJVChx$  (1.575 m), weight 114 lb (51.71 kg), SpO2 100.00%.    Labs: Results for orders placed during the hospital encounter of 05/08/13 (from the past 48 hour(s))  CBC     Status: None   Collection Time    05/08/13  2:47 PM      Result Value Ref Range   WBC 8.5  4.0 - 10.5 K/uL   RBC 4.28  3.87 - 5.11  MIL/uL   Hemoglobin 13.7  12.0 - 15.0 g/dL   HCT 38.4  36.0 - 46.0 %   MCV 89.7  78.0 - 100.0 fL   MCH 32.0  26.0 - 34.0 pg   MCHC 35.7  30.0 - 36.0 g/dL   RDW 12.7  11.5 - 15.5 %   Platelets 337  150 - 400 K/uL  DIFFERENTIAL     Status: None   Collection Time    05/08/13  2:47 PM      Result Value Ref Range   Neutrophils Relative % 67  43 - 77 %   Neutro Abs 5.8  1.7 - 7.7 K/uL   Lymphocytes Relative 20  12 - 46 %   Lymphs Abs 1.7  0.7 - 4.0 K/uL   Monocytes Relative 11  3 - 12 %   Monocytes Absolute 1.0  0.1 - 1.0 K/uL   Eosinophils Relative 1  0 - 5 %   Eosinophils Absolute 0.1  0.0 - 0.7 K/uL   Basophils Relative 1  0 - 1 %   Basophils Absolute 0.1  0.0 - 0.1 K/uL  BASIC METABOLIC PANEL     Status: Abnormal   Collection Time    05/08/13  2:47 PM      Result Value Ref Range   Sodium 129 (*) 137 - 147 mEq/L   Potassium 3.6 (*) 3.7 - 5.3 mEq/L   Chloride 85 (*) 96 -  112 mEq/L   CO2 31  19 - 32 mEq/L   Glucose, Bld 109 (*) 70 - 99 mg/dL   BUN 8  6 - 23 mg/dL   Creatinine, Ser 0.70  0.50 - 1.10 mg/dL   Calcium 9.6  8.4 - 10.5 mg/dL   GFR calc non Af Amer >90  >90 mL/min   GFR calc Af Amer >90  >90 mL/min   Comment: (NOTE)     The eGFR has been calculated using the CKD EPI equation.     This calculation has not been validated in all clinical situations.     eGFR's persistently <90 mL/min signify possible Chronic Kidney     Disease.  Randolm Idol, ED     Status: None   Collection Time    05/08/13  3:16 PM      Result Value Ref Range   Troponin i, poc 0.01  0.00 - 0.08 ng/mL   Comment 3            Comment: Due to the release kinetics of cTnI,     a negative result within the first hours     of the onset of symptoms does not rule out     myocardial infarction with certainty.     If myocardial infarction is still suspected,     repeat the test at appropriate intervals.  TROPONIN I     Status: None   Collection Time    05/08/13 10:20 PM      Result Value Ref Range    Troponin I <0.30  <0.30 ng/mL   Comment:            Due to the release kinetics of cTnI,     a negative result within the first hours     of the onset of symptoms does not rule out     myocardial infarction with certainty.     If myocardial infarction is still suspected,     repeat the test at appropriate intervals.  TSH     Status: None   Collection Time    05/08/13 10:20 PM      Result Value Ref Range   TSH 1.514  0.350 - 4.500 uIU/mL   Comment: Performed at [facility]  TROPONIN I     Status: None   Collection Time    05/09/13  3:45 AM      Result Value Ref Range   Troponin I <0.30  <0.30 ng/mL   Comment:            Due to the release kinetics of cTnI,     a negative result within the first hours     of the onset of symptoms does not rule out     myocardial infarction with certainty.     If myocardial infarction is still suspected,     repeat the test at appropriate intervals.  LIPID PANEL     Status: Abnormal   Collection Time    05/09/13  3:45 AM      Result Value Ref Range   Cholesterol 173  0 - 200 mg/dL   Triglycerides 83  <150 mg/dL   HDL 55  >39 mg/dL   Total CHOL/HDL Ratio 3.1     VLDL 17  0 - 40 mg/dL   LDL Cholesterol 101 (*) 0 - 99 mg/dL   Comment:            Total Cholesterol/HDL:CHD Risk     Coronary  Heart Disease Risk Table                         Men   Women      1/2 Average Risk   3.4   3.3      Average Risk       5.0   4.4      2 X Average Risk   9.6   7.1      3 X Average Risk  23.4   11.0                Use the calculated Patient Ratio     above and the CHD Risk Table     to determine the patient's CHD Risk.                ATP III CLASSIFICATION (LDL):      <100     mg/dL   Optimal      100-129  mg/dL   Near or Above                        Optimal      130-159  mg/dL   Borderline      160-189  mg/dL   High      >190     mg/dL   Very High  BASIC METABOLIC PANEL     Status: Abnormal   Collection Time    05/09/13  3:45 AM       Result Value Ref Range   Sodium 131 (*) 137 - 147 mEq/L   Potassium 3.6 (*) 3.7 - 5.3 mEq/L   Chloride 87 (*) 96 - 112 mEq/L   CO2 28  19 - 32 mEq/L   Glucose, Bld 97  70 - 99 mg/dL   BUN 8  6 - 23 mg/dL   Creatinine, Ser 0.65  0.50 - 1.10 mg/dL   Calcium 9.2  8.4 - 10.5 mg/dL   GFR calc non Af Amer >90  >90 mL/min   GFR calc Af Amer >90  >90 mL/min   Comment: (NOTE)     The eGFR has been calculated using the CKD EPI equation.     This calculation has not been validated in all clinical situations.     eGFR's persistently <90 mL/min signify possible Chronic Kidney     Disease.  TROPONIN I     Status: None   Collection Time    05/09/13 12:10 PM      Result Value Ref Range   Troponin I <0.30  <0.30 ng/mL   Comment:            Due to the release kinetics of cTnI,     a negative result within the first hours     of the onset of symptoms does not rule out     myocardial infarction with certainty.     If myocardial infarction is still suspected,     repeat the test at appropriate intervals.    Disposition:      Follow-up Information   Follow up with Jory Sims, NP On 05/27/2013. (2:10 pm)    Specialty:  Nurse Practitioner   Contact information:   [place] [id number] [phone]       Discharge Medications:    Medication List         acetaminophen 500 MG tablet  Commonly known as:  TYLENOL  Take  500 mg by mouth daily as needed for headache.     ALPRAZolam 0.25 MG tablet  Commonly known as:  XANAX  Take 0.25 mg by mouth as needed for anxiety.     aspirin 81 MG EC tablet  Take 1 tablet (81 mg total) by mouth daily.     atorvastatin 20 MG tablet  Commonly known as:  LIPITOR  Take 1 tablet (20 mg total) by mouth daily at 6 PM.     calcium-vitamin D 500-200 MG-UNIT per tablet  Commonly known as:  OSCAL WITH D  Take 1 tablet by mouth daily.     levothyroxine 100 MCG tablet  Commonly known as:  SYNTHROID, LEVOTHROID  Take 100 mcg by  mouth at bedtime.     losartan 50 MG tablet  Commonly known as:  COZAAR  Take 50 mg by mouth at bedtime.     meclizine 25 MG tablet  Commonly known as:  ANTIVERT  Take 25 mg by mouth daily as needed (for vertigo). For dizziness     metoprolol tartrate 12.5 mg Tabs tablet  Commonly known as:  LOPRESSOR  Take 0.5 tablets (12.5 mg total) by mouth 2 (two) times daily.     promethazine 25 MG tablet  Commonly known as:  PHENERGAN  Take 25 mg by mouth every 6 (six) hours as needed for nausea or vomiting.         Duration of Discharge Encounter: Greater than 30 minutes including physician time.  Angelena Form PA-C 05/09/2013 2:13 PM

## 2013-05-09 NOTE — Discharge Instructions (Signed)
Chest Pain (Nonspecific) °Chest pain has many causes. Your pain could be caused by something serious, such as a heart attack or a blood clot in the lungs. It could also be caused by something less serious, such as a chest bruise or a virus. Follow up with your doctor. More lab tests or other studies may be needed to find the cause of your pain. Most of the time, nonspecific chest pain will improve within 2 to 3 days of rest and mild pain medicine. °HOME CARE °· For chest bruises, you may put ice on the sore area for 15-20 minutes, 03-04 times a day. Do this only if it makes you feel better. °· Put ice in a plastic bag. °· Place a towel between the skin and the bag. °· Rest for the next 2 to 3 days. °· Go back to work if the pain improves. °· See your doctor if the pain lasts longer than 1 to 2 weeks. °· Only take medicine as told by your doctor. °· Quit smoking if you smoke. °GET HELP RIGHT AWAY IF:  °· There is more pain or pain that spreads to the arm, neck, jaw, back, or belly (abdomen). °· You have shortness of breath. °· You cough more than usual or cough up blood. °· You have very bad back or belly pain, feel sick to your stomach (nauseous), or throw up (vomit). °· You have very bad weakness. °· You pass out (faint). °· You have a fever. °Any of these problems may be serious and may be an emergency. Do not wait to see if the problems will go away. Get medical help right away. Call your local emergency services 911 in [place] Do not drive yourself to the hospital. °MAKE SURE YOU:  °· Understand these instructions. °· Will watch this condition. °· Will get help right away if you or your child is not doing well or gets worse. °Document Released: 07/19/2007 Document Revised: 04/24/2011 Document Reviewed: 07/19/2007 °Exit[employer]® Patient Information ©2014 [employer]. ° °

## 2013-05-09 NOTE — Progress Notes (Signed)
    Subjective:  No epigastric pain  Objective:  Vital Signs in the last 24 hours: Temp:  [98.4 F (36.9 C)-98.8 F (37.1 C)] 98.5 F (36.9 C) (03/27 0508) Pulse Rate:  [80-105] 80 (03/27 0854) Resp:  [18-20] 18 (03/27 0508) BP: (110-138)/(64-121) 110/64 mmHg (03/27 0854) SpO2:  [98 %-100 %] 100 % (03/27 0508) Weight:  [114 lb (51.71 kg)] 114 lb (51.71 kg) (03/26 2000)  Intake/Output from previous day: No intake or output data in the 24 hours ending 05/09/13 0916  Physical Exam: General appearance: alert, cooperative and no distress Lungs: clear to auscultation bilaterally Heart: regular rate and rhythm   Rate: 78  Rhythm: normal sinus rhythm  Lab Results:  Recent Labs  05/08/13 1447  WBC 8.5  HGB 13.7  PLT 337    Recent Labs  05/08/13 1447 05/09/13 0345  NA 129* 131*  K 3.6* 3.6*  CL 85* 87*  CO2 31 28  GLUCOSE 109* 97  BUN 8 8  CREATININE 0.70 0.65    Recent Labs  05/08/13 2220 05/09/13 0345  TROPONINI <0.30 <0.30   No results found for this basename: INR,  in the last 72 hours  Imaging: Dg Chest 2 View  05/08/2013   CLINICAL DATA:  Chest discomfort; l ong-term tobacco use  EXAM: CHEST  2 VIEW  COMPARISON:  DG CHEST 2 VIEW dated 11/04/2010  FINDINGS: The lungs are mildly hyperinflated but clear. The cardiac silhouette is normal in size. There is no pleural effusion or pneumothorax. The mediastinum is normal in width. The trachea is midline. The pulmonary vascularity is not engorged. The observed portions of the bony thorax exhibit no acute abnormalities.  IMPRESSION: There is mild hyperinflation which likely reflects COPD. There is no evidence of active cardiopulmonary disease.   Electronically Signed   By: David  Martinique   On: 05/08/2013 16:01    Cardiac Studies:  Assessment/Plan:  The patient is a 63 year old female with a history of tobacco abuse, prior obesity, hypertension, hypothyroidism, GERD, hiatal hernia, Helicobacter pylori gastritis. She  has lost ~175 pounds in the the last tens years. She was recently taken off beta blocker and placed on ARB/HCTZ combination for HTN. She presented with epigastric pain and an abnormal EKG with intermittent LBBB.    Principal Problem:   LBBB (left bundle branch block) Active Problems:   Chest pain   Hypertension   Chronic nausea   Tobacco abuse    PLAN: Lexiscan Myoview today.  Kerin Ransom PA-C Beeper [phone] 05/09/2013, 9:16 AM   Note: With injection of Lexiscan pt developed regular WCT at 150. She was totally asymptomatic and her B/P was stable. Her HR gradually slowed down to sinus tach at 100 with LBBB.   Patient seen, examined. Available data reviewed. Agree with findings, assessment, and plan as outlined by Kerin Ransom, PA-C. Myoview now resulted:  IMPRESSION:  This is a low risk scan. There is no scar or ischemia. There is  decreased activity in the septum, consistent with left bundle branch  block. It is noted that the QRS was narrow at the start of the  study. However with Lexiscan, the patient developed tachycardia with  left bundle branch block. Assessment shows that this was most  probably sinus tachycardia which slowed over time.  Would continue her home medical program and discharge today.  Sherren Mocha, M.D. 05/09/2013 1:48 PM

## 2013-05-27 ENCOUNTER — Encounter: Payer: Self-pay | Admitting: Adult Health

## 2013-05-27 ENCOUNTER — Ambulatory Visit (INDEPENDENT_AMBULATORY_CARE_PROVIDER_SITE_OTHER): Payer: BC Managed Care – PPO | Admitting: Adult Health

## 2013-05-27 VITALS — BP 132/82 | HR 93 | Ht 62.0 in | Wt 117.0 lb

## 2013-05-27 DIAGNOSIS — F17200 Nicotine dependence, unspecified, uncomplicated: Secondary | ICD-10-CM

## 2013-05-27 DIAGNOSIS — Z720 Tobacco use: Secondary | ICD-10-CM

## 2013-05-27 DIAGNOSIS — I4892 Unspecified atrial flutter: Secondary | ICD-10-CM

## 2013-05-27 MED ORDER — LOSARTAN POTASSIUM 50 MG PO TABS: 50.0000 mg | ORAL_TABLET | Freq: Every day | ORAL | Status: DC

## 2013-05-27 MED ORDER — METOPROLOL SUCCINATE ER 25 MG PO TB24: 25.0000 mg | ORAL_TABLET | Freq: Every day | ORAL | Status: DC

## 2013-05-27 NOTE — Assessment & Plan Note (Signed)
Remains in normal sinus rhythm, she has not had any recurrent palpitations or chest pain. I will change her Lopressor 12.5 mg twice a day to long-acting 25 mg to be taken in PM. Cozaar is changed to a daytime dose and she been taking it at nighttime.

## 2013-05-27 NOTE — Progress Notes (Signed)
HPI: Melanie Bradley is a 63 year old patient of Dr. Sherren Mocha to be est. in [place] office, we are following posthospitalization after admission for recurrent chest pain. She was found to have an abnormal EKG with intermittent left bundle branch block, and rule out for myocardial infarction. Lexiscan Myoview done on 05/09/2013 was remarkable for heart rate going from 78 beats per minute 150 beats per minute with wide complex QRS. The patient was diagnosed with rate related left bundle branch block. Her images were negative for ischemia. She was discharged on Lopressor 12.5 mg twice a day, along with aspirin and Lipitor. She is here for further assessment and treatment.   Comes today feeling fatigued, but has not had any issues with recurrent patient's, racing heart, or discomfort in her chest. Medically compliant.       Allergies  Allergen Reactions  . Benadryl [Diphenhydramine Hcl] Hives  . Codeine Other (See Comments)    Refuses to take  . Penicillins Hives and Swelling  . Sulfa Antibiotics Hives    Current Outpatient Prescriptions  Medication Sig Dispense Refill  . acetaminophen (TYLENOL) 500 MG tablet Take 500 mg by mouth daily as needed for headache.       . ALPRAZolam (XANAX) 0.25 MG tablet Take 0.25 mg by mouth as needed for anxiety.       Marland Kitchen aspirin EC 81 MG EC tablet Take 1 tablet (81 mg total) by mouth daily.      Marland Kitchen atorvastatin (LIPITOR) 20 MG tablet Take 1 tablet (20 mg total) by mouth daily at 6 PM.  30 tablet  11  . calcium-vitamin D (OSCAL WITH D) 500-200 MG-UNIT per tablet Take 1 tablet by mouth daily.        Marland Kitchen levothyroxine (SYNTHROID, LEVOTHROID) 100 MCG tablet Take 100 mcg by mouth at bedtime.      Marland Kitchen losartan (COZAAR) 50 MG tablet Take 1 tablet (50 mg total) by mouth daily.  30 tablet  6  . promethazine (PHENERGAN) 25 MG tablet Take 25 mg by mouth every 6 (six) hours as needed for nausea or vomiting.      . metoprolol succinate (TOPROL XL) 25 MG 24 hr tablet Take  1 tablet (25 mg total) by mouth at bedtime.  30 tablet  6   No current facility-administered medications for this visit.    Past Medical History  Diagnosis Date  . Hypertension   . Epigastric burning sensation   . Bloating   . Spastic colon   . Diverticula of colon   . Hiatal hernia 06/26/03    Dr. Laural Golden egd  . Hemorrhoids 06/26/03    Dr. Laural Golden tcs  . GERD (gastroesophageal reflux disease)   . Headache(784.0)   . Helicobacter pylori gastritis 2004    Past Surgical History  Procedure Laterality Date  . Upper gastrointestinal endoscopy  06/26/03    Dr. Wynelle Bourgeois sliding hiatal hernia with 8 mm tongue of gastric type mucosa at distal esophagus bile in the stomach.  . Colonoscopy  06/26/03    Dr. Kem Boroughs diverticula at the sigmoid colon with one of the transverse colon, normal terminal ileoscopy, small external hemorrhoids the  . Cholecystectomy    . Appendectomy    . Tubal ligation    . Esophagogastroduodenoscopy  03/15/2011    Dr. Trevor Iha hiatal hernia, abnormal gastric mucosa of unclear significance. Gastric biopsies negative, no H. pylori.Procedure: ESOPHAGOGASTRODUODENOSCOPY (EGD);  Surgeon: Daneil Dolin, MD;  Location: [facility];  Service: Endoscopy;  Laterality: N/A;  7:30    ROS: Review of systems complete and found to be negative unless listed above  PHYSICAL EXAM BP 132/82  Pulse 93  Ht 5\' 2"  (1.575 m)  Wt 117 lb (53.071 kg)  BMI 21.39 kg/m2 General: Well developed, well nourished, in no acute distress Head: Eyes PERRLA, No xanthomas.   Normal cephalic and atramatic  Lungs: Clear bilaterally to auscultation and percussion. Heart: HRRR S1 S2, without MRG.  Pulses are 2+ & equal.            No carotid bruit. No JVD.  No abdominal bruits. No femoral bruits. Abdomen: Bowel sounds are positive, abdomen soft and non-tender without masses or                  Hernia's noted. Msk:  Back normal, normal gait. Normal strength and tone for  age. Extremities: No clubbing, cyanosis or edema.  DP +1 Neuro: Alert and oriented X 3. Psych:  Good affect, responds appropriately   EKG: Normal sinus rhythm  ASSESSMENT AND PLAN

## 2013-05-27 NOTE — Progress Notes (Deleted)
Name: Melanie Bradley    DOB: 1950/07/07  Age: 63 y.o.  MR#: [id number]       PCP:  Purvis Kilts, MD      Insurance: Payor: BLUE [place] / Plan: BCBS [place] PPO / Product Type: *No Product type* /   CC:    Chief Complaint  Patient presents with  . Chest Pain  . Atrial Flutter    VS Filed Vitals:   05/27/13 1419  BP: 132/82  Pulse: 93  Height: 5\' 2"  (1.575 m)  Weight: 117 lb (53.071 kg)    Weights Current Weight  05/27/13 117 lb (53.071 kg)  05/08/13 114 lb (51.71 kg)  05/11/11 114 lb (51.71 kg)    Blood Pressure  BP Readings from Last 3 Encounters:  05/27/13 132/82  05/09/13 120/72  05/11/11 131/84     Admit date:  (Not on file) Last encounter with RMR:  Visit date not found   Allergy Benadryl; Codeine; Penicillins; and Sulfa antibiotics  Current Outpatient Prescriptions  Medication Sig Dispense Refill  . acetaminophen (TYLENOL) 500 MG tablet Take 500 mg by mouth daily as needed for headache.       . ALPRAZolam (XANAX) 0.25 MG tablet Take 0.25 mg by mouth as needed for anxiety.       Marland Kitchen aspirin EC 81 MG EC tablet Take 1 tablet (81 mg total) by mouth daily.      Marland Kitchen atorvastatin (LIPITOR) 20 MG tablet Take 1 tablet (20 mg total) by mouth daily at 6 PM.  30 tablet  11  . calcium-vitamin D (OSCAL WITH D) 500-200 MG-UNIT per tablet Take 1 tablet by mouth daily.        Marland Kitchen levothyroxine (SYNTHROID, LEVOTHROID) 100 MCG tablet Take 100 mcg by mouth at bedtime.      Marland Kitchen losartan (COZAAR) 50 MG tablet Take 50 mg by mouth at bedtime.      . metoprolol tartrate (LOPRESSOR) 12.5 mg TABS tablet Take 0.5 tablets (12.5 mg total) by mouth 2 (two) times daily.  30 tablet  11  . promethazine (PHENERGAN) 25 MG tablet Take 25 mg by mouth every 6 (six) hours as needed for nausea or vomiting.       No current facility-administered medications for this visit.    Discontinued Meds:    Medications Discontinued During This Encounter  Medication Reason  . meclizine (ANTIVERT) 25  MG tablet Error    Patient Active Problem List   Diagnosis Date Noted  . Atrial flutter 05/09/2013  . COPD (chronic obstructive pulmonary disease) 05/09/2013  . Tobacco abuse 05/08/2013  . LBBB (left bundle branch block)- intermittent 05/08/2013  . Chronic diarrhea 05/11/2011  . Chronic nausea 05/11/2011  . Dyspepsia 02/24/2011  . IBS (irritable bowel syndrome) 02/24/2011  . Chest pain 12/05/2010  . Hypertension 12/05/2010    LABS    Component Value Date/Time   NA 131* 05/09/2013 0345   NA 129* 05/08/2013 1447   NA 138 05/26/2011 0755   NA 136* 09/11/2006   K 3.6* 05/09/2013 0345   K 3.6* 05/08/2013 1447   K 4.6 05/26/2011 0755   CL 87* 05/09/2013 0345   CL 85* 05/08/2013 1447   CL 100 05/26/2011 0755   CO2 28 05/09/2013 0345   CO2 31 05/08/2013 1447   CO2 22 05/26/2011 0755   GLUCOSE 97 05/09/2013 0345   GLUCOSE 109* 05/08/2013 1447   GLUCOSE 93 05/26/2011 0755   BUN 8 05/09/2013 0345   BUN 8 05/08/2013  1447   BUN 11 05/26/2011 0755   CREATININE 0.65 05/09/2013 0345   CREATININE 0.70 05/08/2013 1447   CREATININE 0.88 05/26/2011 0755   CALCIUM 9.2 05/09/2013 0345   CALCIUM 9.6 05/08/2013 1447   CALCIUM 10.0 05/26/2011 0755   GFRNONAA >90 05/09/2013 0345   GFRNONAA >90 05/08/2013 1447   GFRAA >90 05/09/2013 0345   GFRAA >90 05/08/2013 1447   CMP     Component Value Date/Time   NA 131* 05/09/2013 0345   NA 136* 09/11/2006   K 3.6* 05/09/2013 0345   CL 87* 05/09/2013 0345   CO2 28 05/09/2013 0345   GLUCOSE 97 05/09/2013 0345   BUN 8 05/09/2013 0345   CREATININE 0.65 05/09/2013 0345   CREATININE 0.88 05/26/2011 0755   CALCIUM 9.2 05/09/2013 0345   PROT 7.2 05/26/2011 0755   ALBUMIN 4.5 05/26/2011 0755   AST 17 05/26/2011 0755   ALT 8 05/26/2011 0755   ALKPHOS 76 05/26/2011 0755   BILITOT 0.4 05/26/2011 0755   GFRNONAA >90 05/09/2013 0345   GFRAA >90 05/09/2013 0345       Component Value Date/Time   WBC 8.5 05/08/2013 1447   WBC 8.2 05/26/2011 0755   WBC 9.6 09/04/2006   HGB 13.7 05/08/2013  1447   HGB 14.0 05/26/2011 0755   HGB 14.2 09/04/2006   HCT 38.4 05/08/2013 1447   HCT 44.7 05/26/2011 0755   HCT 41 09/04/2006   MCV 89.7 05/08/2013 1447   MCV 97.8 05/26/2011 0755    Lipid Panel     Component Value Date/Time   CHOL 173 05/09/2013 0345   TRIG 83 05/09/2013 0345   HDL 55 05/09/2013 0345   CHOLHDL 3.1 05/09/2013 0345   VLDL 17 05/09/2013 0345   LDLCALC 101* 05/09/2013 0345    ABG No results found for this basename: phart, pco2, pco2art, po2, po2art, hco3, tco2, acidbasedef, o2sat     Lab Results  Component Value Date   TSH 1.514 05/08/2013   BNP (last 3 results) No results found for this basename: PROBNP,  in the last 8760 hours Cardiac Panel (last 3 results) No results found for this basename: CKTOTAL, CKMB, TROPONINI, RELINDX,  in the last 72 hours  Iron/TIBC/Ferritin No results found for this basename: iron, tibc, ferritin     EKG Orders placed during the hospital encounter of 05/08/13  . EKG 12-LEAD  . EKG 12-LEAD  . ED EKG  . ED EKG  . EKG 12-LEAD  . EKG 12-LEAD  . EKG 12-LEAD  . EKG 12-LEAD  . EKG 12-LEAD  . EKG     Prior Assessment and Plan Problem List as of 05/27/2013     Cardiovascular and Mediastinum   Hypertension   Last Assessment & Plan   12/02/2010 Office Visit Written 12/05/2010  6:00 AM by Fay Records, MD     Mildly increased  WIll need to be followed.    LBBB (left bundle branch block)- intermittent   Atrial flutter     Respiratory   COPD (chronic obstructive pulmonary disease)     Digestive   IBS (irritable bowel syndrome)   Last Assessment & Plan   05/11/2011 Office Visit Written 05/11/2011  4:37 PM by Mahala Menghini, PA     Stable.    Chronic diarrhea   Last Assessment & Plan   05/11/2011 Office Visit Written 05/11/2011  4:41 PM by Mahala Menghini, PA     Likely IBS but r/o celiac.  Other   Chest pain   Last Assessment & Plan   12/02/2010 Office Visit Written 12/05/2010  5:59 AM by Fay Records, MD     I do not  think the patient's chest pain is cardiac.  Not with activity.  Nagging, mild discomfort.  Probable GI I encouraged her to continue acid inhibitor.  I would not recomm furhter thesting unless her symptoms change or worsen.    Dyspepsia   Chronic nausea   Last Assessment & Plan   05/11/2011 Office Visit Written 05/11/2011  4:40 PM by Mahala Menghini, PA     Chronic dyspepsia. Chronic nausea with intermittent heaving not necessarily related to a meal. Suspect her symptoms are exacerbated by stress as she has acknowledged. She describes anorexia, early satiety. Although weight has been stable over the last 6 months she describes unintentional weight loss of 15-20 pounds prior to this. We will go ahead and document eradication of H. Pylori. Check H. Pylori stool antigen. Check fasting a.m. Cortisol level. Phenergan for now. Once H.Pylori checked will add back PPI as she is having some heartburn now off the meds. Routine labs. Further recommendations to follow. If work-up negative, I advised her she should consider medication/counseling for anxiety/stress.    Tobacco abuse       Imaging: Dg Chest 2 View  05/08/2013   CLINICAL DATA:  Chest discomfort; l ong-term tobacco use  EXAM: CHEST  2 VIEW  COMPARISON:  DG CHEST 2 VIEW dated 11/04/2010  FINDINGS: The lungs are mildly hyperinflated but clear. The cardiac silhouette is normal in size. There is no pleural effusion or pneumothorax. The mediastinum is normal in width. The trachea is midline. The pulmonary vascularity is not engorged. The observed portions of the bony thorax exhibit no acute abnormalities.  IMPRESSION: There is mild hyperinflation which likely reflects COPD. There is no evidence of active cardiopulmonary disease.   Electronically Signed   By: David  Martinique   On: 05/08/2013 16:01   Nm Myocar Multi W/spect W/wall Motion / Ef  05/09/2013   CLINICAL DATA:  There is a history of left bundle branch block. The patient has had chest pain. The study  was done for further evaluation.  EXAM: MYOCARDIAL IMAGING WITH SPECT (REST AND PHARMACOLOGIC-STRESS)  GATED LEFT VENTRICULAR WALL MOTION STUDY  LEFT VENTRICULAR EJECTION FRACTION  TECHNIQUE: Standard myocardial SPECT imaging was performed after resting intravenous injection of 10 mCi Tc-45m sestamibi. Subsequently, intravenous infusion of Lexiscan was performed under the supervision of the Cardiology staff. At peak effect of the drug, 30 mCi Tc-25m sestamibi was injected intravenously and standard myocardial SPECT imaging was performed. Quantitative gated imaging was also performed to evaluate left ventricular wall motion, and estimate left ventricular ejection fraction.  COMPARISON:  None.  FINDINGS: The patient's initial EKG revealed a narrow QRS. After she received Lexiscan, the heart rate increased to 145 and the QRS widened to a left bundle branch block. The increased rate persisted for several minutes but then began to slow. When the heart rate was slower, P waves could be seen. There were no flutter waves. The raw data reveals no evidence of excess motion. Tomographic images with stress reveal a small area of moderate decreased uptake in the septum. Rest images reveal a moderate area of decreased uptake in the septum. These findings are most consistent with left bundle branch block. Wall motion analysis reveals excellent motion with an ejection fraction of 70%.  IMPRESSION: This is a low risk scan. There is  no scar or ischemia. There is decreased activity in the septum, consistent with left bundle branch block. It is noted that the QRS was narrow at the start of the study. However with Lexiscan, the patient developed tachycardia with left bundle branch block. Assessment shows that this was most probably sinus tachycardia which slowed over time.   Electronically Signed   By: Dola Argyle   On: 05/09/2013 12:58

## 2013-05-27 NOTE — Patient Instructions (Signed)
Your physician recommends that you schedule a follow-up appointment in: 3 months with Dr Bryna Colander will receive a reminder letter two months in advance reminding you to call and schedule your appointment. If you don't receive this letter, please contact our office.  Your physician has recommended you make the following change in your medication:   Stop Metoprolol Tartrate Start Toprol Xl 25 mg daily at night.  You can start taking your Losartan in the morning.

## 2013-05-27 NOTE — Assessment & Plan Note (Signed)
Unfortunately continues to smoke, but is trying to quit. Both her and her husband on a smoking cessation program. I have encouraged her to stop smoking.

## 2013-05-29 ENCOUNTER — Encounter: Payer: Self-pay | Admitting: *Deleted

## 2013-05-29 ENCOUNTER — Telehealth: Payer: Self-pay | Admitting: *Deleted

## 2013-05-29 NOTE — Telephone Encounter (Signed)
Pt worked for two hours yesterday and is out today. Pt can not explain any symptoms except for she just feels tired. Pt states she is feeling a little better today and she knows it her body getting use to the medication. Please advise if you want me to write a note for work.

## 2013-05-29 NOTE — Telephone Encounter (Signed)
She is getting used to new medications. It will take a week or so to get used to new dosing, which is changed to nightly long acting dose. If she needs to take a day or two off due to her symptoms, she can do so. Please reassure her that she will need to be patient with herself in order to become used to the medications.

## 2013-05-29 NOTE — Telephone Encounter (Signed)
Husband is to pick up work excuse note this afternoon.

## 2013-05-29 NOTE — Telephone Encounter (Signed)
Pt states that she has not been feeling whele with the switch in her medications and has missed a couple days of work. She wants to know if she can get a note for work

## 2013-06-17 ENCOUNTER — Other Ambulatory Visit (HOSPITAL_COMMUNITY): Payer: Self-pay | Admitting: Family Medicine

## 2013-06-17 DIAGNOSIS — Z139 Encounter for screening, unspecified: Secondary | ICD-10-CM

## 2013-06-18 ENCOUNTER — Telehealth: Payer: Self-pay | Admitting: Adult Health

## 2013-06-18 NOTE — Telephone Encounter (Signed)
Made pt aware to hold Lipitor for a few weeks. Pt was happy and states she will call and let us know how she is feeling.

## 2013-06-18 NOTE — Telephone Encounter (Signed)
Pt started Lipitor at hosptial 3-26. For three weeks your legs have been hurting. On pain scale the pain is a 10. Pt thinks it's the Lipitor. Please advise. Pt states she had never had to take  Lipitor before and does not know why she is on it. Please advise.

## 2013-06-18 NOTE — Telephone Encounter (Signed)
Patient seen recently by Ms. Gelardi NP in the office. Lipitor is a new medication. Would suggest that she hold Lipitor for the next few weeks to see if her leg discomfort resolves.

## 2013-06-18 NOTE — Telephone Encounter (Signed)
Please call patient back regarding medication side effects / tgs

## 2013-06-23 ENCOUNTER — Ambulatory Visit (HOSPITAL_COMMUNITY)
Admission: RE | Admit: 2013-06-23 | Discharge: 2013-06-23 | Disposition: A | Discharge status: Home / Self Care | Payer: BC Managed Care – PPO | Source: Ambulatory Visit | Attending: Family Medicine | Admitting: Family Medicine

## 2013-06-23 DIAGNOSIS — R928 Other abnormal and inconclusive findings on diagnostic imaging of breast: Secondary | ICD-10-CM | POA: Insufficient documentation

## 2013-06-23 DIAGNOSIS — Z139 Encounter for screening, unspecified: Secondary | ICD-10-CM

## 2013-06-23 DIAGNOSIS — Z1231 Encounter for screening mammogram for malignant neoplasm of breast: Secondary | ICD-10-CM | POA: Insufficient documentation

## 2013-06-25 ENCOUNTER — Other Ambulatory Visit: Payer: Self-pay | Admitting: Family Medicine

## 2013-06-25 DIAGNOSIS — R928 Other abnormal and inconclusive findings on diagnostic imaging of breast: Secondary | ICD-10-CM

## 2013-07-02 ENCOUNTER — Ambulatory Visit (HOSPITAL_COMMUNITY)
Admission: RE | Admit: 2013-07-02 | Discharge: 2013-07-02 | Disposition: A | Discharge status: Home / Self Care | Payer: BC Managed Care – PPO | Source: Ambulatory Visit | Attending: Family Medicine | Admitting: Family Medicine

## 2013-07-02 DIAGNOSIS — R928 Other abnormal and inconclusive findings on diagnostic imaging of breast: Secondary | ICD-10-CM | POA: Insufficient documentation

## 2013-07-31 ENCOUNTER — Telehealth: Payer: Self-pay

## 2013-07-31 NOTE — Telephone Encounter (Signed)
Patient walked in this am after going to [facility] this am at  7am for c/o nausea for three days.She states had used all her phenergan last week.I spoke with front desk staff at [facility] who indicated they had only two doctors today and were unable to make her an appointment.I spoke with patient along with S.Barker LPN and got full history of events.She has long hx of IBS and nausea and has been followed by Dr.Rehman - GI.She denied vomiting or diarrhea. BP 142/98, HR 83.  I then called [facility] back and spoke with Margarita Grizzle RN who was not there yet when patient arrived  at 7am ,indicated she knew patient .She asked me for patients phone number and stated she would speak with person covering Dr.Golden and call patient back and they would probably call refill in for phenergan.Patient states she would eat saltine crackers which helps her nausea. Larene Pickett suggested [facility] and declined stating she would go stay at her sisters and await call from [place] She also states she works for [employer] and could see the PA there tomorrow

## 2013-09-08 ENCOUNTER — Encounter: Payer: Self-pay | Admitting: Cardiology

## 2013-09-08 ENCOUNTER — Ambulatory Visit (INDEPENDENT_AMBULATORY_CARE_PROVIDER_SITE_OTHER): Payer: BC Managed Care – PPO | Admitting: Cardiology

## 2013-09-08 VITALS — BP 110/82 | HR 110 | Ht 62.0 in | Wt 115.0 lb

## 2013-09-08 DIAGNOSIS — R0789 Other chest pain: Secondary | ICD-10-CM

## 2013-09-08 NOTE — Patient Instructions (Signed)
Your physician recommends that you schedule a follow-up appointment in: 1 tear with Dr Bryna Colander will receive a reminder letter two months in advance reminding you to call and schedule your appointment. If you don't receive this letter, please contact our office.  Your physician recommends that you continue on your current medications as directed. Please refer to the Current Medication list given to you today.

## 2013-09-08 NOTE — Progress Notes (Signed)
Clinical Summary Melanie Bradley is a 64 y.o.female last seen by NP Purcell Nails, this is our first visit together. She is seen for the following medical problems.  1. Chest pain - 04/2013 MPI no ischemia - describes some occasional epigastric discomfort, fullness since last visit. Can occur at rest or with exertion, often with bending over. Can sometimes improve with prn xanax.   2. Rate related LBBB - noted on prior cardiac testing, tachycardia induced after Lexiscan with noted LBBB. Imaging was negative for ischemia.   3. Leg pain - leg pains on lipitor, pain resolved once medication stopped - lipid panel 04/2013 : TC 173 TG 83 HDL 55 LDL 101  Past Medical History  Diagnosis Date  . Hypertension   . Epigastric burning sensation   . Bloating   . Spastic colon   . Diverticula of colon   . Hiatal hernia 06/26/03    Dr. Laural Golden egd  . Hemorrhoids 06/26/03    Dr. Laural Golden tcs  . GERD (gastroesophageal reflux disease)   . Headache(784.0)   . Helicobacter pylori gastritis 2004     Allergies  Allergen Reactions  . Benadryl [Diphenhydramine Hcl] Hives  . Codeine Other (See Comments)    Refuses to take  . Penicillins Hives and Swelling  . Sulfa Antibiotics Hives     Current Outpatient Prescriptions  Medication Sig Dispense Refill  . acetaminophen (TYLENOL) 500 MG tablet Take 500 mg by mouth daily as needed for headache.       . ALPRAZolam (XANAX) 0.25 MG tablet Take 0.25 mg by mouth as needed for anxiety.       Marland Kitchen aspirin EC 81 MG EC tablet Take 1 tablet (81 mg total) by mouth daily.      Marland Kitchen atorvastatin (LIPITOR) 20 MG tablet Take 1 tablet (20 mg total) by mouth daily at 6 PM.  30 tablet  11  . calcium-vitamin D (OSCAL WITH D) 500-200 MG-UNIT per tablet Take 1 tablet by mouth daily.        Marland Kitchen levothyroxine (SYNTHROID, LEVOTHROID) 100 MCG tablet Take 100 mcg by mouth at bedtime.      Marland Kitchen losartan (COZAAR) 50 MG tablet Take 1 tablet (50 mg total) by mouth daily.  30 tablet  6  .  metoprolol succinate (TOPROL XL) 25 MG 24 hr tablet Take 1 tablet (25 mg total) by mouth at bedtime.  30 tablet  6  . promethazine (PHENERGAN) 25 MG tablet Take 25 mg by mouth every 6 (six) hours as needed for nausea or vomiting.       No current facility-administered medications for this visit.     Past Surgical History  Procedure Laterality Date  . Upper gastrointestinal endoscopy  06/26/03    Dr. Wynelle Bourgeois sliding hiatal hernia with 8 mm tongue of gastric type mucosa at distal esophagus bile in the stomach.  . Colonoscopy  06/26/03    Dr. Kem Boroughs diverticula at the sigmoid colon with one of the transverse colon, normal terminal ileoscopy, small external hemorrhoids the  . Cholecystectomy    . Appendectomy    . Tubal ligation    . Esophagogastroduodenoscopy  03/15/2011    Dr. Trevor Iha hiatal hernia, abnormal gastric mucosa of unclear significance. Gastric biopsies negative, no H. pylori.Procedure: ESOPHAGOGASTRODUODENOSCOPY (EGD);  Surgeon: Daneil Dolin, MD;  Location: [facility];  Service: Endoscopy;  Laterality: N/A;  7:30     Allergies  Allergen Reactions  . Benadryl [Diphenhydramine Hcl] Hives  . Codeine Other (See Comments)  Refuses to take  . Penicillins Hives and Swelling  . Sulfa Antibiotics Hives      Family History  Problem Relation Age of Onset  . Hypertension Mother   . COPD Father   . Heart failure Father   . Cancer Sister   . Cardiomyopathy Brother   . Pneumonia Other   . Angina Mother      Social History Melanie Bradley reports that she has been smoking Cigarettes.  She has a 12.5 pack-year smoking history. She does not have any smokeless tobacco history on file. Melanie Bradley reports that she does not drink alcohol.   Review of Systems CONSTITUTIONAL: No weight loss, fever, chills, weakness or fatigue.  HEENT: Eyes: No visual loss, blurred vision, double vision or yellow sclerae.No hearing loss, sneezing, congestion, runny nose or  sore throat.  SKIN: No rash or itching.  CARDIOVASCULAR: per HPI RESPIRATORY: No shortness of breath, cough or sputum.  GASTROINTESTINAL: No anorexia, nausea, vomiting or diarrhea. No abdominal pain or blood.  GENITOURINARY: No burning on urination, no polyuria NEUROLOGICAL: No headache, dizziness, syncope, paralysis, ataxia, numbness or tingling in the extremities. No change in bowel or bladder control.  MUSCULOSKELETAL: No muscle, back pain, joint pain or stiffness.  LYMPHATICS: No enlarged nodes. No history of splenectomy.  PSYCHIATRIC: No history of depression or anxiety.  ENDOCRINOLOGIC: No reports of sweating, cold or heat intolerance. No polyuria or polydipsia.  Marland Kitchen   Physical Examination p 72 bp 110/82 Wt 115 lbs BMI 21 Gen: resting comfortably, no acute distress HEENT: no scleral icterus, pupils equal round and reactive, no palptable cervical adenopathy,  CV: RRR, no m/r/g, no JVD, no carotid bruits Resp: Clear to auscultation bilaterally GI: abdomen is soft, non-tender, non-distended, normal bowel sounds, no hepatosplenomegaly MSK: extremities are warm, no edema.  Skin: warm, no rash Neuro:  no focal deficits Psych: appropriate affect   Diagnostic Studies 04/2013 Lexiscan MPI FINDINGS:  The patient's initial EKG revealed a narrow QRS. After she received  Lexiscan, the heart rate increased to 145 and the QRS widened to a  left bundle Melanie Bradley block. The increased rate persisted for several  minutes but then began to slow. When the heart rate was slower, P  waves could be seen. There were no flutter waves. The raw data  reveals no evidence of excess motion. Tomographic images with stress  reveal a small area of moderate decreased uptake in the septum. Rest  images reveal a moderate area of decreased uptake in the septum.  These findings are most consistent with left bundle Melanie Bradley block.  Wall motion analysis reveals excellent motion with an ejection  fraction of 70%.    IMPRESSION:  This is a low risk scan. There is no scar or ischemia. There is  decreased activity in the septum, consistent with left bundle Melanie Bradley  block. It is noted that the QRS was narrow at the start of the  study. However with Lexiscan, the patient developed tachycardia with  left bundle Melanie Bradley block. Assessment shows that this was most  probably sinus tachycardia which slowed over time.       Assessment and Plan  1. Chest pain - negative MPI, appears to be non-cardiac - describes epigastric discomfort, recommended she take her prevacid daily instead of prn only  2. Leg pain - related to lipitor, no resolved. Her lipid panel is reasonable, no strong indication for trial of alternative statin. Continue diet and lifestyle modification    F/u 1 year  Arnoldo Lenis, M.D., F.A.C.C.

## 2013-12-27 ENCOUNTER — Other Ambulatory Visit: Payer: Self-pay | Admitting: Adult Health

## 2014-01-26 ENCOUNTER — Other Ambulatory Visit: Payer: Self-pay | Admitting: *Deleted

## 2014-01-26 MED ORDER — METOPROLOL SUCCINATE ER 25 MG PO TB24: 25.0000 mg | ORAL_TABLET | Freq: Every day | ORAL | Status: DC

## 2014-01-26 MED ORDER — LOSARTAN POTASSIUM 50 MG PO TABS: 50.0000 mg | ORAL_TABLET | Freq: Every day | ORAL | Status: DC

## 2014-01-26 NOTE — Telephone Encounter (Signed)
Pt is out of medications needs called in today Metoprolol and losartin to [facility]

## 2014-01-26 NOTE — Telephone Encounter (Signed)
Refill complete 

## 2014-04-10 ENCOUNTER — Institutional Professional Consult (permissible substitution): Payer: BLUE CROSS/BLUE SHIELD | Admitting: Neurology

## 2014-05-04 ENCOUNTER — Ambulatory Visit: Payer: BLUE CROSS/BLUE SHIELD | Admitting: Neurology

## 2014-06-03 ENCOUNTER — Encounter: Payer: Self-pay | Admitting: Neurology

## 2014-06-03 ENCOUNTER — Ambulatory Visit (INDEPENDENT_AMBULATORY_CARE_PROVIDER_SITE_OTHER): Payer: BLUE CROSS/BLUE SHIELD | Admitting: Neurology

## 2014-06-03 VITALS — BP 163/102 | HR 69 | Ht 62.0 in | Wt 117.2 lb

## 2014-06-03 DIAGNOSIS — R519 Headache, unspecified: Secondary | ICD-10-CM

## 2014-06-03 DIAGNOSIS — R413 Other amnesia: Secondary | ICD-10-CM | POA: Diagnosis not present

## 2014-06-03 DIAGNOSIS — R51 Headache: Secondary | ICD-10-CM | POA: Diagnosis not present

## 2014-06-03 HISTORY — DX: Headache, unspecified: R51.9

## 2014-06-03 HISTORY — DX: Other amnesia: R41.3

## 2014-06-03 MED ORDER — TOPIRAMATE 25 MG PO TABS: ORAL_TABLET | ORAL | Status: DC

## 2014-06-03 NOTE — Progress Notes (Signed)
Reason for visit: Memory disorder  Referring physician: Dr. Sherwood Gambler Melanie Bradley is a 64 y.o. 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 The patient indicates that the problem began following the death of her father. The patient has had some depression issues. She continues to work, and she does indicate that she has some problems with being a bit forgetful at work, but she is able to get through the day well. She denies that any of her coworkers have mentioned anything about her memory. The patient believes that there have been some problems with cognitive processing after changes in blood pressure medications that occurred 2 years ago. The patient denies that she has changed any activities of daily living because of memory. She denies problems with managing her finances, driving a car, or problems with cooking. The patient is able to keep up with appointments and medications without difficulty. She also indicates that she has chronic headache problems, almost daily nature. She will have only 2 or 3 days a month without headache. She indicates that when she goes on vacation, the headaches improved dramatically. The headaches are on the top of the head, and are generalized in nature. She denies any diplopia or phonophobia with the headache, or any significant nausea or vomiting. She does have a history of significant blood pressure problems. She indicates that her sister had problems with memory as well. She is sent to this office for an evaluation.  Past Medical History  Diagnosis Date  . Hypertension   . Epigastric burning sensation   . Bloating   . Spastic colon   . Diverticula of colon   . Hiatal hernia 06/26/03    Dr. Laural Golden egd  . Hemorrhoids 06/26/03    Dr. Laural Golden tcs  . GERD (gastroesophageal reflux disease)   . Headache(784.0)   .  Helicobacter pylori gastritis 2004  . Atrial fibrillation   . Memory difficulty 06/03/2014  . Headache disorder 06/03/2014    Past Surgical History  Procedure Laterality Date  . Upper gastrointestinal endoscopy  06/26/03    Dr. Wynelle Bourgeois sliding hiatal hernia with 8 mm tongue of gastric type mucosa at distal esophagus bile in the stomach.  . Colonoscopy  06/26/03    Dr. Kem Boroughs diverticula at the sigmoid colon with one of the transverse colon, normal terminal ileoscopy, small external hemorrhoids the  . Cholecystectomy    . Appendectomy    . Tubal ligation    . Esophagogastroduodenoscopy  03/15/2011    Dr. Trevor Iha hiatal hernia, abnormal gastric mucosa of unclear significance. Gastric biopsies negative, no H. pylori.Procedure: ESOPHAGOGASTRODUODENOSCOPY (EGD);  Surgeon: Daneil Dolin, MD;  Location: [facility];  Service: Endoscopy;  Laterality: N/A;  7:30    Family History  Problem Relation Age of Onset  . Hypertension Mother   . Angina Mother   . COPD Father   . Heart failure Father   . Cancer Sister   . Dementia Sister   . Cardiomyopathy Brother   . Pneumonia Other   . Migraines Sister     Social history:  reports that she has been smoking Cigarettes.  She has a 12.5 pack-year smoking history. She does not have any smokeless tobacco history on file. She reports that she does not drink alcohol or use illicit drugs.  Medications:  Prior to Admission medications   Medication  Sig Start Date End Date Taking? Authorizing Provider  acetaminophen (TYLENOL) 500 MG tablet Take 500 mg by mouth daily as needed for headache.    Yes Historical Provider, MD  ALPRAZolam (XANAX) 0.25 MG tablet Take 0.5 mg by mouth 3 (three) times daily as needed for anxiety.    Yes Historical Provider, MD  butalbital-aspirin-caffeine [facility]) 50-325-40 MG per capsule Take 1 capsule by mouth 2 (two) times daily as needed for headache.   Yes Historical Provider, MD  calcium-vitamin D (OSCAL  WITH D) 500-200 MG-UNIT per tablet Take 1 tablet by mouth daily.     Yes Historical Provider, MD  cyclobenzaprine (FLEXERIL) 10 MG tablet Take 10 mg by mouth 3 (three) times daily as needed for muscle spasms.   Yes Historical Provider, MD  levothyroxine (SYNTHROID, LEVOTHROID) 100 MCG tablet Take 100 mcg by mouth at bedtime.   Yes Historical Provider, MD  losartan (COZAAR) 50 MG tablet Take 1 tablet (50 mg total) by mouth daily. 01/26/14  Yes Arnoldo Lenis, MD  metoprolol succinate (TOPROL-XL) 25 MG 24 hr tablet Take 1 tablet (25 mg total) by mouth at bedtime. 01/26/14  Yes Arnoldo Lenis, MD  promethazine (PHENERGAN) 25 MG tablet Take 25 mg by mouth every 6 (six) hours as needed for nausea or vomiting.   Yes Historical Provider, MD      Allergies  Allergen Reactions  . Benadryl [Diphenhydramine Hcl] Hives  . Codeine Other (See Comments)    Refuses to take  . Msm [Methylsulfonylmethane] Hives  . Nubain [Nalbuphine Hcl] Hives  . Penicillins Hives and Swelling  . Sulfa Antibiotics Hives    ROS:  Out of a complete 14 system review of symptoms, the patient complains only of the following symptoms, and all other reviewed systems are negative.  Memory loss, headache Not enough sleep, sleepiness  Blood pressure 163/102, pulse 69, height 5\' 2"  (1.575 m), weight 117 lb 3.2 oz (53.162 kg).  Physical Exam  General: The patient is alert and cooperative at the time of the examination.  Eyes: Pupils are equal, round, and reactive to light. Discs are flat bilaterally.  Neck: The neck is supple, no carotid bruits are noted.  Respiratory: The respiratory examination is clear.  Cardiovascular: The cardiovascular examination reveals a regular rate and rhythm, no obvious murmurs or rubs are noted.  Skin: Extremities are without significant edema.  Neurologic Exam  Mental status: The MMSE score was 22/30.  Cranial nerves: Facial symmetry is present. There is good sensation of the  face to pinprick and soft touch bilaterally. The strength of the facial muscles and the muscles to head turning and shoulder shrug are normal bilaterally. Speech is well enunciated, no aphasia or dysarthria is noted. Extraocular movements are full. Visual fields are full. The tongue is midline, and the patient has symmetric elevation of the soft palate. No obvious hearing deficits are noted.  Motor: The motor testing reveals 5 over 5 strength of all 4 extremities. Good symmetric motor tone is noted throughout.  Sensory: Sensory testing is intact to pinprick, soft touch, vibration sensation, and position sense on all 4 extremities. No evidence of extinction is noted.  Coordination: Cerebellar testing reveals good finger-nose-finger and heel-to-shin bilaterally.  Gait and station: Gait is normal. Tandem gait is normal. Romberg is negative. No drift is seen.  Reflexes: Deep tendon reflexes are symmetric and normal bilaterally. Toes are downgoing bilaterally.   Assessment/Plan:  1. Chronic headache syndrome  2. Memory disturbance  The patient indicates that  she has had headaches since she was around 55 or 64 years old, but the headaches are getting worse recently. The patient will be given a trial on Topamax for the headaches, she takes Fioricet, I have cautioned her not to take this daily. She may take Tylenol and get good improvement with the headache. The patient also has some memory issues, this will need to be followed. Blood work will be done today, she will have MRI evaluation of the brain. She will follow-up in 5 or 6 months, or sooner if needed.  Jill Alexanders MD 06/03/2014 7:33 PM  [facility] [address] [place], [place] [address]  Phone [phone] Fax [phone]

## 2014-06-03 NOTE — Patient Instructions (Addendum)
Topamax (topiramate) is a seizure medication that has an FDA approval for seizures and for migraine headache. Potential side effects of this medication include weight loss, cognitive slowing, tingling in the fingers and toes, and carbonated drinks will taste bad. If any significant side effects are noted on this drug, please contact our office.  Headaches, Frequently Asked Questions MIGRAINE HEADACHES Q: What is migraine? What causes it? How can I treat it? A: Generally, migraine headaches begin as a dull ache. Then they develop into a constant, throbbing, and pulsating pain. You may experience pain at the temples. You may experience pain at the front or back of one or both sides of the head. The pain is usually accompanied by a combination of:  Nausea.  Vomiting.  Sensitivity to light and noise. Some people (about 15%) experience an aura (see below) before an attack. The cause of migraine is believed to be chemical reactions in the brain. Treatment for migraine may include over-the-counter or prescription medications. It may also include self-help techniques. These include relaxation training and biofeedback.  Q: What is an aura? A: About 15% of people with migraine get an "aura". This is a sign of neurological symptoms that occur before a migraine headache. You may see wavy or jagged lines, dots, or flashing lights. You might experience tunnel vision or blind spots in one or both eyes. The aura can include visual or auditory hallucinations (something imagined). It may include disruptions in smell (such as strange odors), taste or touch. Other symptoms include:  Numbness.  A "pins and needles" sensation.  Difficulty in recalling or speaking the correct word. These neurological events may last as long as 60 minutes. These symptoms will fade as the headache begins. Q: What is a trigger? A: Certain physical or environmental factors can lead to or "trigger" a migraine. These  include:  Foods.  Hormonal changes.  Weather.  Stress. It is important to remember that triggers are different for everyone. To help prevent migraine attacks, you need to figure out which triggers affect you. Keep a headache diary. This is a good way to track triggers. The diary will help you talk to your healthcare professional about your condition. Q: Does weather affect migraines? A: Bright sunshine, hot, humid conditions, and drastic changes in barometric pressure may lead to, or "trigger," a migraine attack in some people. But studies have shown that weather does not act as a trigger for everyone with migraines. Q: What is the link between migraine and hormones? A: Hormones start and regulate many of your body's functions. Hormones keep your body in balance within a constantly changing environment. The levels of hormones in your body are unbalanced at times. Examples are during menstruation, pregnancy, or menopause. That can lead to a migraine attack. In fact, about three quarters of all women with migraine report that their attacks are related to the menstrual cycle.  Q: Is there an increased risk of stroke for migraine sufferers? A: The likelihood of a migraine attack causing a stroke is very remote. That is not to say that migraine sufferers cannot have a stroke associated with their migraines. In persons under age 81, the most common associated factor for stroke is migraine headache. But over the course of a person's normal life span, the occurrence of migraine headache may actually be associated with a reduced risk of dying from cerebrovascular disease due to stroke.  Q: What are acute medications for migraine? A: Acute medications are used to treat the pain  of the headache after it has started. Examples over-the-counter medications, NSAIDs, ergots, and triptans.  Q: What are the triptans? A: Triptans are the newest class of abortive medications. They are specifically targeted to treat  migraine. Triptans are vasoconstrictors. They moderate some chemical reactions in the brain. The triptans work on receptors in your brain. Triptans help to restore the balance of a neurotransmitter called serotonin. Fluctuations in levels of serotonin are thought to be a main cause of migraine.  Q: Are over-the-counter medications for migraine effective? A: Over-the-counter, or "OTC," medications may be effective in relieving mild to moderate pain and associated symptoms of migraine. But you should see your caregiver before beginning any treatment regimen for migraine.  Q: What are preventive medications for migraine? A: Preventive medications for migraine are sometimes referred to as "prophylactic" treatments. They are used to reduce the frequency, severity, and length of migraine attacks. Examples of preventive medications include antiepileptic medications, antidepressants, beta-blockers, calcium channel blockers, and NSAIDs (nonsteroidal anti-inflammatory drugs). Q: Why are anticonvulsants used to treat migraine? A: During the past few years, there has been an increased interest in antiepileptic drugs for the prevention of migraine. They are sometimes referred to as "anticonvulsants". Both epilepsy and migraine may be caused by similar reactions in the brain.  Q: Why are antidepressants used to treat migraine? A: Antidepressants are typically used to treat people with depression. They may reduce migraine frequency by regulating chemical levels, such as serotonin, in the brain.  Q: What alternative therapies are used to treat migraine? A: The term "alternative therapies" is often used to describe treatments considered outside the scope of conventional Western medicine. Examples of alternative therapy include acupuncture, acupressure, and yoga. Another common alternative treatment is herbal therapy. Some herbs are believed to relieve headache pain. Always discuss alternative therapies with your caregiver  before proceeding. Some herbal products contain arsenic and other toxins. TENSION HEADACHES Q: What is a tension-type headache? What causes it? How can I treat it? A: Tension-type headaches occur randomly. They are often the result of temporary stress, anxiety, fatigue, or anger. Symptoms include soreness in your temples, a tightening band-like sensation around your head (a "vice-like" ache). Symptoms can also include a pulling feeling, pressure sensations, and contracting head and neck muscles. The headache begins in your forehead, temples, or the back of your head and neck. Treatment for tension-type headache may include over-the-counter or prescription medications. Treatment may also include self-help techniques such as relaxation training and biofeedback. CLUSTER HEADACHES Q: What is a cluster headache? What causes it? How can I treat it? A: Cluster headache gets its name because the attacks come in groups. The pain arrives with little, if any, warning. It is usually on one side of the head. A tearing or bloodshot eye and a runny nose on the same side of the headache may also accompany the pain. Cluster headaches are believed to be caused by chemical reactions in the brain. They have been described as the most severe and intense of any headache type. Treatment for cluster headache includes prescription medication and oxygen. SINUS HEADACHES Q: What is a sinus headache? What causes it? How can I treat it? A: When a cavity in the bones of the face and skull (a sinus) becomes inflamed, the inflammation will cause localized pain. This condition is usually the result of an allergic reaction, a tumor, or an infection. If your headache is caused by a sinus blockage, such as an infection, you will probably have a fever. An  x-ray will confirm a sinus blockage. Your caregiver's treatment might include antibiotics for the infection, as well as antihistamines or decongestants.  REBOUND HEADACHES Q: What is a  rebound headache? What causes it? How can I treat it? A: A pattern of taking acute headache medications too often can lead to a condition known as "rebound headache." A pattern of taking too much headache medication includes taking it more than 2 days per week or in excessive amounts. That means more than the label or a caregiver advises. With rebound headaches, your medications not only stop relieving pain, they actually begin to cause headaches. Doctors treat rebound headache by tapering the medication that is being overused. Sometimes your caregiver will gradually substitute a different type of treatment or medication. Stopping may be a challenge. Regularly overusing a medication increases the potential for serious side effects. Consult a caregiver if you regularly use headache medications more than 2 days per week or more than the label advises. ADDITIONAL QUESTIONS AND ANSWERS Q: What is biofeedback? A: Biofeedback is a self-help treatment. Biofeedback uses special equipment to monitor your body's involuntary physical responses. Biofeedback monitors:  Breathing.  Pulse.  Heart rate.  Temperature.  Muscle tension.  Brain activity. Biofeedback helps you refine and perfect your relaxation exercises. You learn to control the physical responses that are related to stress. Once the technique has been mastered, you do not need the equipment any more. Q: Are headaches hereditary? A: Four out of five (80%) of people that suffer report a family history of migraine. Scientists are not sure if this is genetic or a family predisposition. Despite the uncertainty, a child has a 50% chance of having migraine if one parent suffers. The child has a 75% chance if both parents suffer.  Q: Can children get headaches? A: By the time they reach high school, most young people have experienced some type of headache. Many safe and effective approaches or medications can prevent a headache from occurring or stop it  after it has begun.  Q: What type of doctor should I see to diagnose and treat my headache? A: Start with your primary caregiver. Discuss his or her experience and approach to headaches. Discuss methods of classification, diagnosis, and treatment. Your caregiver may decide to recommend you to a headache specialist, depending upon your symptoms or other physical conditions. Having diabetes, allergies, etc., may require a more comprehensive and inclusive approach to your headache. The National Headache Foundation will provide, upon request, a list of [facility] physician members in your state. Document Released: 04/22/2003 Document Revised: 04/24/2011 Document Reviewed: 09/30/2007 [facility] Patient Information 2015 [place], [place]. This information is not intended to replace advice given to you by your health care provider. Make sure you discuss any questions you have with your health care provider.

## 2014-06-06 LAB — VITAMIN B12: VITAMIN B 12: 239 pg/mL (ref 211–946)

## 2014-06-06 LAB — METHYLMALONIC ACID, SERUM: Methylmalonic Acid: 256 nmol/L (ref 0–378)

## 2014-06-06 LAB — COPPER, SERUM: COPPER: 99 ug/dL (ref 72–166)

## 2014-06-06 LAB — RPR: RPR: NONREACTIVE

## 2014-06-08 ENCOUNTER — Telehealth: Payer: Self-pay | Admitting: Neurology

## 2014-06-08 ENCOUNTER — Telehealth: Payer: Self-pay

## 2014-06-08 NOTE — Telephone Encounter (Signed)
error 

## 2014-06-08 NOTE — Telephone Encounter (Signed)
-----   Message from Kathrynn Ducking, MD sent at 06/06/2014  9:42 AM EDT -----  The blood work results are unremarkable. Please call the patient.  ----- Message -----    From: [facility] Lab Results In Interface    Sent: 06/04/2014   7:50 AM      To: Kathrynn Ducking, MD

## 2014-06-08 NOTE — Telephone Encounter (Signed)
Patient is calling as she needs a short letter for her employment stating she came to our office for an appointment on 06/03/14 @ 8:30.  She prefers that the letter be emailed if possible to [email or web address] or you may fax to [phone].  Thanks!

## 2014-06-08 NOTE — Telephone Encounter (Signed)
Letter faxed. Confirmation received

## 2014-06-08 NOTE — Telephone Encounter (Signed)
Spoke to patient and relayed results 

## 2014-06-09 DIAGNOSIS — R51 Headache: Secondary | ICD-10-CM | POA: Diagnosis not present

## 2014-06-09 DIAGNOSIS — R413 Other amnesia: Secondary | ICD-10-CM | POA: Diagnosis not present

## 2014-06-12 ENCOUNTER — Other Ambulatory Visit: Payer: Self-pay | Admitting: Diagnostic Neuroimaging

## 2014-06-12 DIAGNOSIS — R519 Headache, unspecified: Secondary | ICD-10-CM

## 2014-06-12 DIAGNOSIS — R413 Other amnesia: Secondary | ICD-10-CM

## 2014-06-12 DIAGNOSIS — R51 Headache: Secondary | ICD-10-CM

## 2014-06-13 ENCOUNTER — Telehealth: Payer: Self-pay | Admitting: Neurology

## 2014-06-13 NOTE — Telephone Encounter (Signed)
  I called patient. The MRI the brain shows moderate white matter changes. The patient does have a history of migraine headache. I have asked to go on low-dose aspirin, 81 mg daily. 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 I have discussed the possibility of starting medication for memory. The patient would like to come into our office for a revisit and discuss the MRI results. I will try to get this set up.   MRI brain 06/12/2014:  IMPRESSION:  Abnormal MRI brain (without) demonstrating: 1. Severe left anterior / mesial temporal lobe atrophy.  2. Moderate periventricular and subcortical foci of non-specific T2 hyperintensities, likely chronic small vessel ischemic disease. 3. No acute findings. 4. Significant progression of atrophy compared to CT on 05/25/09.

## 2014-06-15 NOTE — Telephone Encounter (Signed)
Appointment scheduled for 06/24/14.

## 2014-06-24 ENCOUNTER — Ambulatory Visit (INDEPENDENT_AMBULATORY_CARE_PROVIDER_SITE_OTHER): Payer: BLUE CROSS/BLUE SHIELD | Admitting: Neurology

## 2014-06-24 ENCOUNTER — Encounter: Payer: Self-pay | Admitting: Neurology

## 2014-06-24 VITALS — BP 168/102 | HR 78 | Ht 62.0 in | Wt 115.0 lb

## 2014-06-24 DIAGNOSIS — R51 Headache: Secondary | ICD-10-CM | POA: Diagnosis not present

## 2014-06-24 DIAGNOSIS — R519 Headache, unspecified: Secondary | ICD-10-CM

## 2014-06-24 DIAGNOSIS — R413 Other amnesia: Secondary | ICD-10-CM | POA: Diagnosis not present

## 2014-06-24 MED ORDER — DONEPEZIL HCL 5 MG PO TABS: 5.0000 mg | ORAL_TABLET | Freq: Every day | ORAL | Status: DC

## 2014-06-24 NOTE — Progress Notes (Signed)
Reason for visit: Memory disturbance  Melanie Bradley is an 64 y.o. female  History of present illness:  Melanie Bradley is a 64 year old right-handed white female with a history of frequent headaches and a memory disorder. The patient has undergone blood work, and this was unremarkable. MRI of the brain was done showing a moderate level small vessel disease. The patient does have hypertension, she was asked to go on low-dose aspirin. 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 The patient returns for reevaluation. The patient was given a her prescription for Topamax, but she never took the medication. She indicates that she has not had many headaches over the last several days, and she wants to see how she does off of the medication. The patient is still working, but she is considering retiring in the near future.  Past Medical History  Diagnosis Date  . Hypertension   . Epigastric burning sensation   . Bloating   . Spastic colon   . Diverticula of colon   . Hiatal hernia 06/26/03    Dr. Laural Golden egd  . Hemorrhoids 06/26/03    Dr. Laural Golden tcs  . GERD (gastroesophageal reflux disease)   . Headache(784.0)   . Helicobacter pylori gastritis 2004  . Atrial fibrillation   . Memory difficulty 06/03/2014  . Headache disorder 06/03/2014    Past Surgical History  Procedure Laterality Date  . Upper gastrointestinal endoscopy  06/26/03    Dr. Wynelle Bourgeois sliding hiatal hernia with 8 mm tongue of gastric type mucosa at distal esophagus bile in the stomach.  . Colonoscopy  06/26/03    Dr. Kem Boroughs diverticula at the sigmoid colon with one of the transverse colon, normal terminal ileoscopy, small external hemorrhoids the  . Cholecystectomy    . Appendectomy    . Tubal ligation    . Esophagogastroduodenoscopy  03/15/2011    Dr. Trevor Iha hiatal hernia, abnormal gastric mucosa of unclear significance. Gastric  biopsies negative, no H. pylori.Procedure: ESOPHAGOGASTRODUODENOSCOPY (EGD);  Surgeon: Daneil Dolin, MD;  Location: [facility];  Service: Endoscopy;  Laterality: N/A;  7:30    Family History  Problem Relation Age of Onset  . Hypertension Mother   . Angina Mother   . COPD Father   . Heart failure Father   . Cancer Sister   . Dementia Sister   . Cardiomyopathy Brother   . Pneumonia Other   . Migraines Sister     Social history:  reports that she has been smoking Cigarettes.  She has a 12.5 pack-year smoking history. She does not have any smokeless tobacco history on file. She reports that she does not drink alcohol or use illicit drugs.    Allergies  Allergen Reactions  . Benadryl [Diphenhydramine Hcl] Hives  . Codeine Other (See Comments)    Refuses to take  . Msm [Methylsulfonylmethane] Hives  . Nubain [Nalbuphine Hcl] Hives  . Penicillins Hives and Swelling  . Sulfa Antibiotics Hives    Medications:  Prior to Admission medications   Medication Sig Start Date End Date Taking? Authorizing Provider  acetaminophen (TYLENOL) 500 MG tablet Take 500 mg by mouth daily as needed for headache.    Yes Historical Provider, MD  ALPRAZolam (XANAX) 0.25 MG tablet Take 0.5 mg by mouth 3 (three) times daily as needed for anxiety.    Yes Historical Provider, MD  aspirin 81 MG tablet Take 81 mg by mouth daily.   Yes  Historical Provider, MD  butalbital-aspirin-caffeine [facility]) 50-325-40 MG per capsule Take 1 capsule by mouth 2 (two) times daily as needed for headache.   Yes Historical Provider, MD  calcium-vitamin D (OSCAL WITH D) 500-200 MG-UNIT per tablet Take 1 tablet by mouth daily.     Yes Historical Provider, MD  cyclobenzaprine (FLEXERIL) 10 MG tablet Take 10 mg by mouth 3 (three) times daily as needed for muscle spasms.   Yes Historical Provider, MD  levothyroxine (SYNTHROID, LEVOTHROID) 100 MCG tablet Take 100 mcg by mouth at bedtime.   Yes Historical Provider, MD  losartan  (COZAAR) 50 MG tablet Take 1 tablet (50 mg total) by mouth daily. 01/26/14  Yes Arnoldo Lenis, MD  metoprolol succinate (TOPROL-XL) 25 MG 24 hr tablet Take 1 tablet (25 mg total) by mouth at bedtime. 01/26/14  Yes Arnoldo Lenis, MD  promethazine (PHENERGAN) 25 MG tablet Take 25 mg by mouth every 6 (six) hours as needed for nausea or vomiting.   Yes Historical Provider, MD  topiramate (TOPAMAX) 25 MG tablet Take one tablet at night for one week, then take 2 tablets at night 06/03/14  Yes Kathrynn Ducking, MD    ROS:  Out of a complete 14 system review of symptoms, the patient complains only of the following symptoms, and all other reviewed systems are negative.  Headache Nausea Memory disturbance  Blood pressure 168/102, pulse 78, height 5\' 2"  (1.575 m), weight 115 lb (52.164 kg).  Physical Exam  General: The patient is alert and cooperative at the time of the examination.  Skin: No significant peripheral edema is noted.   Neurologic Exam  Mental status: The patient is alert and oriented x 3 at the time of the examination. The patient has apparent normal recent and remote memory, with an apparently normal attention span and concentration ability.   Cranial nerves: Facial symmetry is present. Speech is normal, no aphasia or dysarthria is noted. Extraocular movements are full. Visual fields are full.  Motor: The patient has good strength in all 4 extremities.  Sensory examination: Soft touch sensation is symmetric on the face, arms, and legs.  Coordination: The patient has good finger-nose-finger and heel-to-shin bilaterally.  Gait and station: The patient has a normal gait. Tandem gait is normal. Romberg is negative. No drift is seen.  Reflexes: Deep tendon reflexes are symmetric.   MRI brain 06/12/2014:  IMPRESSION:  Abnormal MRI brain (without) demonstrating: 1. Severe left anterior / mesial temporal lobe atrophy.  2. Moderate periventricular and subcortical foci  of non-specific T2 hyperintensities, likely chronic small vessel ischemic disease. 3. No acute findings. 4. Significant progression of atrophy compared to CT on 05/25/09.    Assessment/Plan:  1. Memory disturbance  2. Small vessel disease by MRI  3. Headache  The patient likely has a tension type headache that improves when she is out of stressful environments. The patient is amenable to going on Aricept, we will start this medication. The patient is interested in learning more about Alzheimer's research, I will have our research coordinator call her. She will follow-up in 6 months.  Jill Alexanders MD 06/24/2014 8:03 PM  [facility] [address] [place], [place] [address]  Phone [phone] Fax [phone]

## 2014-06-24 NOTE — Patient Instructions (Signed)
   Begin Aricept (donepezil) at 5 mg at night for one month. If this medication is well-tolerated, please call our office and we will call in a prescription for the 10 mg tablets. Look out for side effects that may include nausea, diarrhea, weight loss, or stomach cramps. This medication will also cause a runny nose, therefore there is no need for allergy medications for this purpose.  

## 2014-07-06 ENCOUNTER — Ambulatory Visit (HOSPITAL_COMMUNITY)
Admission: RE | Admit: 2014-07-06 | Discharge: 2014-07-06 | Disposition: A | Discharge status: Home / Self Care | Payer: BLUE CROSS/BLUE SHIELD | Source: Ambulatory Visit | Attending: Family Medicine | Admitting: Family Medicine

## 2014-07-06 ENCOUNTER — Other Ambulatory Visit (HOSPITAL_COMMUNITY): Payer: Self-pay | Admitting: Family Medicine

## 2014-07-06 DIAGNOSIS — Z1231 Encounter for screening mammogram for malignant neoplasm of breast: Secondary | ICD-10-CM | POA: Diagnosis present

## 2014-07-21 ENCOUNTER — Telehealth: Payer: Self-pay | Admitting: Neurology

## 2014-07-21 MED ORDER — DONEPEZIL HCL 10 MG PO TABS: 10.0000 mg | ORAL_TABLET | Freq: Every day | ORAL | Status: DC

## 2014-07-21 NOTE — Telephone Encounter (Signed)
Last OV note says:   Begin Aricept (donepezil) at 5 mg at night for one month. If this medication is well-tolerated, please call our office and we will call in a prescription for the 10 mg tablets  Rx has been updated/sent.   I called the patient back.  Got no answer in cell, voicemail was not set up, unable to leave message. Called home.  Got no answer.  Left message.

## 2014-07-21 NOTE — Telephone Encounter (Signed)
Patient called and requested a refill on Rx. donepezil (ARICEPT) 5 MG tablet. She also stated that Dr. Jannifer Franklin stated that he was going to change it to 10 MG. She only has 3 pills left so she would like to get it refilled as soon as possible. Please call and advise.

## 2014-08-25 ENCOUNTER — Telehealth: Payer: Self-pay

## 2014-08-25 ENCOUNTER — Encounter (INDEPENDENT_AMBULATORY_CARE_PROVIDER_SITE_OTHER): Payer: Self-pay | Admitting: *Deleted

## 2014-08-25 NOTE — Telephone Encounter (Signed)
Patient requested to be called back later

## 2014-08-25 NOTE — Telephone Encounter (Signed)
I spoke to the patient about the CREAD study. Patient would like to know more information about the study before making a decision in whether to participate or not. Informed Consent Form will be mailed, and I will check with the patient in a week.

## 2014-09-10 ENCOUNTER — Encounter: Payer: Self-pay | Admitting: Cardiology

## 2014-09-10 ENCOUNTER — Ambulatory Visit (INDEPENDENT_AMBULATORY_CARE_PROVIDER_SITE_OTHER): Payer: BLUE CROSS/BLUE SHIELD | Admitting: Cardiology

## 2014-09-10 VITALS — BP 118/70 | HR 88 | Ht 62.0 in | Wt 109.0 lb

## 2014-09-10 DIAGNOSIS — I10 Essential (primary) hypertension: Secondary | ICD-10-CM

## 2014-09-10 DIAGNOSIS — R0789 Other chest pain: Secondary | ICD-10-CM

## 2014-09-10 NOTE — Progress Notes (Signed)
Patient ID: Melanie Bradley, female   DOB: [birth year], 64 y.o.   MRN: [id number]     Clinical Summary Melanie Bradley is a 64 y.o.female seen today for follow up of the following medical problems.   1. Chest pain - 04/2013 MPI no ischemia - describes some occasional epigastric discomfort, fullness since last visit. Can occur at rest or with exertion, often with bending over. Can sometimes improve with prn xanax.    - denies any significant pain since last visit. She attributes the resolution of symptoms to changed eating habits. Denies any SOB or DOE. Rides bike, walks 2-3 miles daily.   2. Rate related LBBB - noted on prior cardiac testing, tachycardia induced after Lexiscan inhection with noted LBBB. Imaging was negative for ischemia.   3. Leg pain - leg pains on lipitor, pain resolved once medication stopped - lipid panel 04/2013 : TC 173 TG 83 HDL 55 LDL 101  4. Memory deficit - on aricept, followed by neuro Past Medical History  Diagnosis Date  . Hypertension   . Epigastric burning sensation   . Bloating   . Spastic colon   . Diverticula of colon   . Hiatal hernia 06/26/03    Dr. Laural Golden egd  . Hemorrhoids 06/26/03    Dr. Laural Golden tcs  . GERD (gastroesophageal reflux disease)   . Headache(784.0)   . Helicobacter pylori gastritis 2004  . Atrial fibrillation   . Memory difficulty 06/03/2014  . Headache disorder 06/03/2014     Allergies  Allergen Reactions  . Benadryl [Diphenhydramine Hcl] Hives  . Codeine Other (See Comments)    Refuses to take  . Msm [Methylsulfonylmethane] Hives  . Nubain [Nalbuphine Hcl] Hives  . Penicillins Hives and Swelling  . Sulfa Antibiotics Hives     Current Outpatient Prescriptions  Medication Sig Dispense Refill  . acetaminophen (TYLENOL) 500 MG tablet Take 500 mg by mouth daily as needed for headache.     . ALPRAZolam (XANAX) 0.25 MG tablet Take 0.5 mg by mouth 3 (three) times daily as needed for anxiety.     Marland Kitchen aspirin 81 MG tablet Take  81 mg by mouth daily.    . butalbital-aspirin-caffeine (FIORINAL) 50-325-40 MG per capsule Take 1 capsule by mouth 2 (two) times daily as needed for headache.    . calcium-vitamin D (OSCAL WITH D) 500-200 MG-UNIT per tablet Take 1 tablet by mouth daily.      . cyclobenzaprine (FLEXERIL) 10 MG tablet Take 10 mg by mouth 3 (three) times daily as needed for muscle spasms.    Marland Kitchen donepezil (ARICEPT) 10 MG tablet Take 1 tablet (10 mg total) by mouth at bedtime. 30 tablet 3  . levothyroxine (SYNTHROID, LEVOTHROID) 100 MCG tablet Take 100 mcg by mouth at bedtime.    Marland Kitchen losartan (COZAAR) 50 MG tablet Take 1 tablet (50 mg total) by mouth daily. 30 tablet 11  . metoprolol succinate (TOPROL-XL) 25 MG 24 hr tablet Take 1 tablet (25 mg total) by mouth at bedtime. 30 tablet 11  . promethazine (PHENERGAN) 25 MG tablet Take 25 mg by mouth every 6 (six) hours as needed for nausea or vomiting.    . topiramate (TOPAMAX) 25 MG tablet Take one tablet at night for one week, then take 2 tablets at night 60 tablet 3   No current facility-administered medications for this visit.     Past Surgical History  Procedure Laterality Date  . Upper gastrointestinal endoscopy  06/26/03    Dr. Wynelle Bourgeois sliding hiatal  hernia with 8 mm tongue of gastric type mucosa at distal esophagus bile in the stomach.  . Colonoscopy  06/26/03    Dr. Kem Boroughs diverticula at the sigmoid colon with one of the transverse colon, normal terminal ileoscopy, small external hemorrhoids the  . Cholecystectomy    . Appendectomy    . Tubal ligation    . Esophagogastroduodenoscopy  03/15/2011    Dr. Trevor Iha hiatal hernia, abnormal gastric mucosa of unclear significance. Gastric biopsies negative, no H. pylori.Procedure: ESOPHAGOGASTRODUODENOSCOPY (EGD);  Surgeon: Daneil Dolin, MD;  [facility];  Service: Endoscopy;  Laterality: N/A;  7:30     Allergies  Allergen Reactions  . Benadryl [Diphenhydramine Hcl] Hives  . Codeine  Other (See Comments)    Refuses to take  . Msm [Methylsulfonylmethane] Hives  . Nubain [Nalbuphine Hcl] Hives  . Penicillins Hives and Swelling  . Sulfa Antibiotics Hives      Family History  Problem Relation Age of Onset  . Hypertension Mother   . Angina Mother   . COPD Father   . Heart failure Father   . Cancer Sister   . Dementia Sister   . Cardiomyopathy Brother   . Pneumonia Other   . Migraines Sister      Social History Melanie Bradley reports that she has been smoking Cigarettes.  She has a 12.5 pack-year smoking history. She does not have any smokeless tobacco history on file. Melanie Bradley reports that she does not drink alcohol.   Review of Systems CONSTITUTIONAL: No weight loss, fever, chills, weakness or fatigue.  HEENT: Eyes: No visual loss, blurred vision, double vision or yellow sclerae.No hearing loss, sneezing, congestion, runny nose or sore throat.  SKIN: No rash or itching.  CARDIOVASCULAR: per HPI RESPIRATORY: No shortness of breath, cough or sputum.  GASTROINTESTINAL: No anorexia, nausea, vomiting or diarrhea. No abdominal pain or blood.  GENITOURINARY: No burning on urination, no polyuria NEUROLOGICAL: memory change MUSCULOSKELETAL: No muscle, back pain, joint pain or stiffness.  LYMPHATICS: No enlarged nodes. No history of splenectomy.  PSYCHIATRIC: No history of depression or anxiety.  ENDOCRINOLOGIC: No reports of sweating, cold or heat intolerance. No polyuria or polydipsia.  Marland Kitchen   Physical Examination Filed Vitals:   09/10/14 1051  BP: 118/70  Pulse: 88   Filed Vitals:   09/10/14 1051  Height: 5\' 2"  (1.575 m)  Weight: 109 lb (49.442 kg)    Gen: resting comfortably, no acute distress HEENT: no scleral icterus, pupils equal round and reactive, no palptable cervical adenopathy,  CV: RRR, no m/r/g, no JVD Resp: Clear to auscultation bilaterally GI: abdomen is soft, non-tender, non-distended, normal bowel sounds, no hepatosplenomegaly MSK:  extremities are warm, no edema.  Skin: warm, no rash Neuro:  no focal deficits Psych: appropriate affect   Diagnostic Studies 04/2013 Lexiscan MPI FINDINGS:  The patient's initial EKG revealed a narrow QRS. After she received  Lexiscan, the heart rate increased to 145 and the QRS widened to a  left bundle Jerris Keltz block. The increased rate persisted for several  minutes but then began to slow. When the heart rate was slower, P  waves could be seen. There were no flutter waves. The raw data  reveals no evidence of excess motion. Tomographic images with stress  reveal a small area of moderate decreased uptake in the septum. Rest  images reveal a moderate area of decreased uptake in the septum.  These findings are most consistent with left bundle Cyd Hostler block.  Wall motion analysis reveals  excellent motion with an ejection  fraction of 70%.  IMPRESSION:  This is a low risk scan. There is no scar or ischemia. There is  decreased activity in the septum, consistent with left bundle Krue Peterka  block. It is noted that the QRS was narrow at the start of the  study. However with Lexiscan, the patient developed tachycardia with  left bundle Dallin Mccorkel block. Assessment shows that this was most  probably sinus tachycardia which slowed over time.    Assessment and Plan  1. Chest pain - negative MPI, appears to be non-cardiac. Resolved with changing in eating habits, previously improved with antacid.  - no further workup   May follow up as needed      Arnoldo Lenis, M.D.

## 2014-09-10 NOTE — Patient Instructions (Signed)
Your physician recommends that you schedule a follow-up appointment in: as needed     Your physician recommends that you continue on your current medications as directed. Please refer to the Current Medication list given to you today.   Thank you for choosing [facility] !         

## 2014-09-22 ENCOUNTER — Telehealth: Payer: Self-pay

## 2014-09-22 NOTE — Telephone Encounter (Signed)
I spoke to the patient in re the CREAD study. Patient is not interested in participating.

## 2014-11-24 ENCOUNTER — Other Ambulatory Visit: Payer: Self-pay | Admitting: Neurology

## 2014-12-03 ENCOUNTER — Ambulatory Visit (INDEPENDENT_AMBULATORY_CARE_PROVIDER_SITE_OTHER): Payer: BLUE CROSS/BLUE SHIELD | Admitting: Adult Health

## 2014-12-03 ENCOUNTER — Encounter: Payer: Self-pay | Admitting: Adult Health

## 2014-12-03 ENCOUNTER — Ambulatory Visit: Payer: BLUE CROSS/BLUE SHIELD | Admitting: Adult Health

## 2014-12-03 ENCOUNTER — Telehealth: Payer: Self-pay | Admitting: Adult Health

## 2014-12-03 VITALS — BP 150/98 | HR 61 | Ht 62.0 in | Wt 109.0 lb

## 2014-12-03 DIAGNOSIS — R413 Other amnesia: Secondary | ICD-10-CM

## 2014-12-03 MED ORDER — MEMANTINE HCL 28 X 5 MG & 21 X 10 MG PO TABS: ORAL_TABLET | ORAL | Status: DC

## 2014-12-03 NOTE — Progress Notes (Signed)
I have read the note, and I agree with the clinical assessment and plan.  Maricus Tanzi KEITH   

## 2014-12-03 NOTE — Patient Instructions (Signed)
Continue Aricept Start namenda. If your symptoms worsen or you develop new symptoms please let us know.   Memantine Tablets What is this medicine? MEMANTINE (MEM an teen) is used to treat dementia caused by Alzheimer's disease. This medicine may be used for other purposes; ask your health care provider or pharmacist if you have questions. What should I tell my health care provider before I take this medicine? They need to know if you have any of these conditions: -difficulty passing urine -kidney disease -liver disease -seizures -an unusual or allergic reaction to memantine, other medicines, foods, dyes, or preservatives -pregnant or trying to get pregnant -breast-feeding How should I use this medicine? Take this medicine by mouth with a glass of water. Follow the directions on the prescription label. You may take this medicine with or without food. Take your doses at regular intervals. Do not take your medicine more often than directed. Continue to take your medicine even if you feel better. Do not stop taking except on the advice of your doctor or health care professional. Talk to your pediatrician regarding the use of this medicine in children. Special care may be needed. Overdosage: If you think you have taken too much of this medicine contact a poison control center or emergency room at once. NOTE: This medicine is only for you. Do not share this medicine with others. What if I miss a dose? If you miss a dose, take it as soon as you can. If it is almost time for your next dose, take only that dose. Do not take double or extra doses. If you do not take your medicine for several days, contact your health care provider. Your dose may need to be changed. What may interact with this medicine? -acetazolamide -amantadine -cimetidine -dextromethorphan -dofetilide -hydrochlorothiazide -ketamine -metformin -methazolamide -quinidine -ranitidine -sodium bicarbonate -triamterene This  list may not describe all possible interactions. Give your health care provider a list of all the medicines, herbs, non-prescription drugs, or dietary supplements you use. Also tell them if you smoke, drink alcohol, or use illegal drugs. Some items may interact with your medicine. What should I watch for while using this medicine? Visit your doctor or health care professional for regular checks on your progress. Check with your doctor or health care professional if there is no improvement in your symptoms or if they get worse. You may get drowsy or dizzy. Do not drive, use machinery, or do anything that needs mental alertness until you know how this drug affects you. Do not stand or sit up quickly, especially if you are an older patient. This reduces the risk of dizzy or fainting spells. Alcohol can make you more drowsy and dizzy. Avoid alcoholic drinks. What side effects may I notice from receiving this medicine? Side effects that you should report to your doctor or health care professional as soon as possible: -allergic reactions like skin rash, itching or hives, swelling of the face, lips, or tongue -agitation or a feeling of restlessness -depressed mood -dizziness -hallucinations -redness, blistering, peeling or loosening of the skin, including inside the mouth -seizures -vomiting Side effects that usually do not require medical attention (report to your doctor or health care professional if they continue or are bothersome): -constipation -diarrhea -headache -nausea -trouble sleeping This list may not describe all possible side effects. Call your doctor for medical advice about side effects. You may report side effects to FDA at 1-800-FDA-1088. Where should I keep my medicine? Keep out of the reach of children. Store  at room temperature between 15 degrees and 30 degrees C (59 degrees and 86 degrees F). Throw away any unused medicine after the expiration date. NOTE: This sheet is a  summary. It may not cover all possible information. If you have questions about this medicine, talk to your doctor, pharmacist, or health care provider.    2016, Elsevier/Gold Standard. (2012-11-18 14:10:42)

## 2014-12-03 NOTE — Progress Notes (Signed)
Melanie Bradley: Melanie Bradley DOB: 02-Sep-1950  REASON FOR VISIT: follow up- memory  HISTORY FROM: Melanie Bradley  HISTORY OF PRESENT ILLNESS: Melanie Bradley is a 64 year old female with a history of headaches and a memory disorder. She returns today for follow-up. Melanie Bradley states that she has done well on Aricept. Denies any GI upset. She states that she does have irritable bowel and spastic colon but has not noticed any difference since starting Aricept. At home the Melanie Bradley is able to complete all ADLs independently. She operates a Teacher, music without difficulty. She denies having to give up anything due to Melanie Bradley memory. She denies any new medical issues. She returns today for an evaluation. HISTORY 06/24/14 (WILLIS): Melanie Bradley is a 64 year old right-handed white female with a history of frequent headaches and a memory disorder. The Melanie Bradley has undergone blood work, and this was unremarkable. MRI of the brain was done showing a moderate level small vessel disease. The Melanie Bradley does have hypertension, she was asked to go on low-dose aspirin. 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 The Melanie Bradley returns for reevaluation. The Melanie Bradley was given a Melanie Bradley prescription for Topamax, but she never took the medication. She indicates that she has not had many headaches over the last several days, and she wants to see how she does off of the medication. The Melanie Bradley is still working, but she is considering retiring in the near future.  REVIEW OF SYSTEMS: Out of a complete 14 system review of symptoms, the Melanie Bradley complains only of the following symptoms, and all other reviewed systems are negative.  See HPI  ALLERGIES: Allergies  Allergen Reactions  . Benadryl [Diphenhydramine Hcl] Hives  . Codeine Other (See Comments)    Refuses to take  . Msm [Methylsulfonylmethane] Hives  . Nubain [Nalbuphine Hcl] Hives  . Penicillins Hives and Swelling   . Sulfa Antibiotics Hives    HOME MEDICATIONS: Outpatient Prescriptions Prior to Visit  Medication Sig Dispense Refill  . acetaminophen (TYLENOL) 500 MG tablet Take 500 mg by mouth daily as needed for headache.     Marland Kitchen aspirin 81 MG tablet Take 81 mg by mouth daily.    . butalbital-aspirin-caffeine (FIORINAL) 50-325-40 MG per capsule Take 1 capsule by mouth 2 (two) times daily as needed for headache.    . calcium-vitamin D (OSCAL WITH D) 500-200 MG-UNIT per tablet Take 1 tablet by mouth daily.      . cyclobenzaprine (FLEXERIL) 10 MG tablet Take 10 mg by mouth 3 (three) times daily as needed for muscle spasms.    Marland Kitchen donepezil (ARICEPT) 10 MG tablet TAKE 1 TABLET BY MOUTH AT BEDTIME. 30 tablet 0  . levothyroxine (SYNTHROID, LEVOTHROID) 100 MCG tablet Take 100 mcg by mouth at bedtime.    Marland Kitchen losartan (COZAAR) 50 MG tablet Take 1 tablet (50 mg total) by mouth daily. 30 tablet 11  . metoprolol succinate (TOPROL-XL) 25 MG 24 hr tablet Take 1 tablet (25 mg total) by mouth at bedtime. 30 tablet 11  . promethazine (PHENERGAN) 25 MG tablet Take 25 mg by mouth every 6 (six) hours as needed for nausea or vomiting.    Marland Kitchen ALPRAZolam (XANAX) 0.25 MG tablet Take 0.5 mg by mouth 3 (three) times daily as needed for anxiety.     . topiramate (TOPAMAX) 25 MG tablet Take one tablet at night for one week, then take 2 tablets at night (Melanie Bradley not taking: Reported on 09/10/2014) 60 tablet  3   No facility-administered medications prior to visit.    PAST MEDICAL HISTORY: Past Medical History  Diagnosis Date  . Hypertension   . Epigastric burning sensation   . Bloating   . Spastic colon   . Diverticula of colon   . Hiatal hernia 06/26/03    Dr. Laural Golden egd  . Hemorrhoids 06/26/03    Dr. Laural Golden tcs  . GERD (gastroesophageal reflux disease)   . Headache(784.0)   . Helicobacter pylori gastritis 2004  . Atrial fibrillation ([place])   . Memory difficulty 06/03/2014  . Headache disorder 06/03/2014    PAST SURGICAL  HISTORY: Past Surgical History  Procedure Laterality Date  . Upper gastrointestinal endoscopy  06/26/03    Dr. Wynelle Bourgeois sliding hiatal hernia with 8 mm tongue of gastric type mucosa at distal esophagus bile in the stomach.  . Colonoscopy  06/26/03    Dr. Kem Boroughs diverticula at the sigmoid colon with one of the transverse colon, normal terminal ileoscopy, small external hemorrhoids the  . Cholecystectomy    . Appendectomy    . Tubal ligation    . Esophagogastroduodenoscopy  03/15/2011    Dr. Trevor Iha hiatal hernia, abnormal gastric mucosa of unclear significance. Gastric biopsies negative, no H. pylori.Procedure: ESOPHAGOGASTRODUODENOSCOPY (EGD);  Surgeon: Daneil Dolin, MD;  Location: [facility];  Service: Endoscopy;  Laterality: N/A;  7:30    FAMILY HISTORY: Family History  Problem Relation Age of Onset  . Hypertension Mother   . Angina Mother   . COPD Father   . Heart failure Father   . Cancer Sister   . Dementia Sister   . Cardiomyopathy Brother   . Pneumonia Other   . Migraines Sister     SOCIAL HISTORY: Social History   Social History  . Marital Status: Married    Spouse Name: N/A  . Number of Children: N/A  . Years of Education: 12   Occupational History  .     Social History Main Topics  . Smoking status: Current Every Day Smoker -- 0.50 packs/day for 25 years    Types: Cigarettes    Start date: 09/10/1966  . Smokeless tobacco: Never Used     Comment: Smokes 6 cigarettes daily  . Alcohol Use: No  . Drug Use: No  . Sexual Activity: Yes   Other Topics Concern  . Not on file   Social History Narrative   Melanie Bradley is right handed.   Melanie Bradley drinks 2 cups of caffeine daily.         PHYSICAL EXAM  Filed Vitals:   12/03/14 0835  BP: 150/98  Pulse: 61  Height: 5\' 2"  (1.575 m)  Weight: 109 lb (49.442 kg)   Body mass index is 19.93 kg/(m^2).   MMSE - Mini Mental State Exam 12/03/2014 06/03/2014  Orientation to time 5 5    Orientation to Place 5 3  Registration 3 3  Attention/ Calculation 2 2  Recall 1 0  Language- name 2 objects 2 2  Language- repeat 1 1  Language- follow 3 step command 3 3  Language- read & follow direction 1 1  Write a sentence 1 1  Copy design 1 1  Total score 25 22     Generalized: Well developed, in no acute distress   Neurological examination  Mentation: Alert oriented to time, place, history taking. Follows all commands speech and language fluent Cranial nerve II-XII: Pupils were equal round reactive to light. Extraocular movements were full, visual field were full on confrontational  test. Facial sensation and strength were normal. Uvula tongue midline. Head turning and shoulder shrug  were normal and symmetric. Motor: The motor testing reveals 5 over 5 strength of all 4 extremities. Good symmetric motor tone is noted throughout.  Sensory: Sensory testing is intact to soft touch on all 4 extremities. No evidence of extinction is noted.  Coordination: Cerebellar testing reveals good finger-nose-finger and heel-to-shin bilaterally.  Gait and station: Gait is normal. Tandem gait is normal. Romberg is negative. No drift is seen.  Reflexes: Deep tendon reflexes are symmetric and normal bilaterally.   DIAGNOSTIC DATA (LABS, IMAGING, TESTING) - I reviewed Melanie Bradley records, labs, notes, testing and imaging myself where available.  Lab Results  Component Value Date   WBC 8.5 05/08/2013   HGB 13.7 05/08/2013   HCT 38.4 05/08/2013   MCV 89.7 05/08/2013   PLT 337 05/08/2013      Component Value Date/Time   NA 131* 05/09/2013 0345   NA 136* 09/11/2006   K 3.6* 05/09/2013 0345   CL 87* 05/09/2013 0345   CO2 28 05/09/2013 0345   GLUCOSE 97 05/09/2013 0345   BUN 8 05/09/2013 0345   CREATININE 0.65 05/09/2013 0345   CREATININE 0.88 05/26/2011 0755   CALCIUM 9.2 05/09/2013 0345   PROT 7.2 05/26/2011 0755   ALBUMIN 4.5 05/26/2011 0755   AST 17 05/26/2011 0755   ALT 8 05/26/2011  0755   ALKPHOS 76 05/26/2011 0755   BILITOT 0.4 05/26/2011 0755   GFRNONAA >90 05/09/2013 0345   GFRAA >90 05/09/2013 0345   Lab Results  Component Value Date   CHOL 173 05/09/2013   HDL 55 05/09/2013   LDLCALC 101* 05/09/2013   TRIG 83 05/09/2013   CHOLHDL 3.1 05/09/2013    ASSESSMENT AND PLAN 64 y.o. year old female  has a past medical history of Hypertension; Epigastric burning sensation; Bloating; Spastic colon; Diverticula of colon; Hiatal hernia (06/26/03); Hemorrhoids (06/26/03); GERD (gastroesophageal reflux disease); Headache(784.0); Helicobacter pylori gastritis (2004); Atrial fibrillation ([place]); Memory difficulty (06/03/2014); and Headache disorder (06/03/2014). here with:  1.   Memory difficulty   Overall the Melanie Bradley is doing well. Melanie Bradley memory has remained stable. She will continue on Aricept. We will go ahead and start the Melanie Bradley on Namenda as well. She will begin on a titration Dosepak. I have reviewed the side effects with the Melanie Bradley and Melanie Bradley husband. She is amenable to trying this. Melanie Bradley advised that if Melanie Bradley symptoms worsen or she develops any new symptoms she should let us know. She will follow-up in 6 months or sooner if needed.  Ward Givens, MSN, NP-C 12/03/2014, 9:05 AM [facility] [address] [place], [place] [address] [phone]

## 2014-12-03 NOTE — Telephone Encounter (Addendum)
Pt called and states she called her pharmacy and was told it would be $75 for her medication and that they do have a generic. She wants to know if it would be ok to have it, and if so does she need to have a new Rx? Please call and advise [phone]

## 2014-12-03 NOTE — Telephone Encounter (Signed)
Ok to have generic.

## 2014-12-03 NOTE — Telephone Encounter (Signed)
I called the pharmacy and spoke with Ovid Curd.  Says they went ahead and changed the Rx to generic on their own, and co-pay reduced to $9.  They state the patient is aware and is picking up the Rx today.

## 2014-12-03 NOTE — Telephone Encounter (Signed)
Pt called stating Namenda will cost $70 and she can't afford it. I inquired if it was brand or generic and she did not know. She is to call pharmacy and call this operator back.

## 2014-12-21 ENCOUNTER — Other Ambulatory Visit: Payer: Self-pay | Admitting: Neurology

## 2014-12-29 ENCOUNTER — Other Ambulatory Visit: Payer: Self-pay | Admitting: Neurology

## 2015-02-16 ENCOUNTER — Other Ambulatory Visit: Payer: Self-pay | Admitting: Cardiology

## 2015-03-22 ENCOUNTER — Other Ambulatory Visit: Payer: Self-pay | Admitting: Neurology

## 2015-04-16 ENCOUNTER — Other Ambulatory Visit: Payer: Self-pay | Admitting: Neurology

## 2015-04-26 ENCOUNTER — Telehealth: Payer: Self-pay | Admitting: Neurology

## 2015-04-26 NOTE — Telephone Encounter (Signed)
Called Melanie Bradley back. Relayed Dr Jaynee Eagles message. Melanie Bradley verbalized understanding. She wants to speak to Dr Jannifer Franklin about starting xanax and if it's ok to take this with her condition. She does not want it to cause her memory problems down the road. This is her main concern. She states she is doing fairly well. Advised I will send message to Dr Jannifer Franklin for him to get when he is back in the office. She understands.

## 2015-04-26 NOTE — Telephone Encounter (Signed)
Patient called to find out if Dr. Jannifer Franklin will allow her to continue taking XANAX 0.5mg 

## 2015-04-26 NOTE — Telephone Encounter (Signed)
Dr Jaynee Eagles- do you want pt to wait until Wed when Dr Jannifer Franklin is back?

## 2015-04-26 NOTE — Telephone Encounter (Signed)
She will have to wait until Wednesday, I don't see that Dr. Jannifer Franklin prescribed this thanks

## 2015-04-26 NOTE — Telephone Encounter (Signed)
I called patient. The patient apparently was given alprazolam by her primary care physician, and she is asking if we would now give her the prescription, I have asked her to contact her primary physician. I will see her in April.

## 2015-04-26 NOTE — Telephone Encounter (Signed)
Called pt back. Advised Dr Jannifer Franklin out of the office until Wed. May have to wait until he is back in the office. She states she has not taken xanax for awhile now. Physician in [place] was original prescriber. Advised I will call her back latest Wed w/ an answer. She verbalized understanding.

## 2015-05-29 ENCOUNTER — Emergency Department (HOSPITAL_COMMUNITY)
Admission: EM | Admit: 2015-05-29 | Discharge: 2015-05-29 | Disposition: A | Discharge status: Home / Self Care | Payer: BLUE CROSS/BLUE SHIELD | Attending: Emergency Medicine | Admitting: Emergency Medicine

## 2015-05-29 ENCOUNTER — Encounter (HOSPITAL_COMMUNITY): Payer: Self-pay | Admitting: Emergency Medicine

## 2015-05-29 ENCOUNTER — Emergency Department (HOSPITAL_COMMUNITY): Payer: BLUE CROSS/BLUE SHIELD

## 2015-05-29 DIAGNOSIS — F17210 Nicotine dependence, cigarettes, uncomplicated: Secondary | ICD-10-CM | POA: Diagnosis not present

## 2015-05-29 DIAGNOSIS — I4891 Unspecified atrial fibrillation: Secondary | ICD-10-CM | POA: Diagnosis not present

## 2015-05-29 DIAGNOSIS — Y929 Unspecified place or not applicable: Secondary | ICD-10-CM | POA: Insufficient documentation

## 2015-05-29 DIAGNOSIS — S22080A Wedge compression fracture of T11-T12 vertebra, initial encounter for closed fracture: Secondary | ICD-10-CM | POA: Diagnosis not present

## 2015-05-29 DIAGNOSIS — Y939 Activity, unspecified: Secondary | ICD-10-CM | POA: Diagnosis not present

## 2015-05-29 DIAGNOSIS — Z79899 Other long term (current) drug therapy: Secondary | ICD-10-CM | POA: Insufficient documentation

## 2015-05-29 DIAGNOSIS — Y999 Unspecified external cause status: Secondary | ICD-10-CM | POA: Diagnosis not present

## 2015-05-29 DIAGNOSIS — S22000A Wedge compression fracture of unspecified thoracic vertebra, initial encounter for closed fracture: Secondary | ICD-10-CM

## 2015-05-29 DIAGNOSIS — Z7982 Long term (current) use of aspirin: Secondary | ICD-10-CM | POA: Diagnosis not present

## 2015-05-29 DIAGNOSIS — R51 Headache: Secondary | ICD-10-CM | POA: Diagnosis not present

## 2015-05-29 DIAGNOSIS — S3992XA Unspecified injury of lower back, initial encounter: Secondary | ICD-10-CM | POA: Diagnosis present

## 2015-05-29 DIAGNOSIS — I10 Essential (primary) hypertension: Secondary | ICD-10-CM | POA: Diagnosis not present

## 2015-05-29 MED ORDER — HYDROCODONE-ACETAMINOPHEN 5-325 MG PO TABS
1.0000 | ORAL_TABLET | Freq: Once | ORAL | Status: AC
  Administered 2015-05-29: 1 via ORAL
  Filled 2015-05-29: qty 1

## 2015-05-29 MED ORDER — IBUPROFEN 400 MG PO TABS
400.0000 mg | ORAL_TABLET | Freq: Once | ORAL | Status: AC
  Administered 2015-05-29: 400 mg via ORAL
  Filled 2015-05-29: qty 1

## 2015-05-29 MED ORDER — CYCLOBENZAPRINE HCL 10 MG PO TABS
10.0000 mg | ORAL_TABLET | Freq: Once | ORAL | Status: AC
  Administered 2015-05-29: 10 mg via ORAL
  Filled 2015-05-29: qty 1

## 2015-05-29 MED ORDER — HYDROCODONE-ACETAMINOPHEN 5-325 MG PO TABS: 1.0000 | ORAL_TABLET | ORAL | Status: DC | PRN

## 2015-05-29 MED ORDER — METHOCARBAMOL 500 MG PO TABS
500.0000 mg | ORAL_TABLET | Freq: Three times a day (TID) | ORAL | Status: DC
Start: 2015-05-29 — End: 2015-12-31
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
## 2015-06-03 ENCOUNTER — Ambulatory Visit: Payer: BLUE CROSS/BLUE SHIELD | Admitting: Adult Health

## 2015-06-10 ENCOUNTER — Other Ambulatory Visit: Payer: Self-pay | Admitting: Neurology

## 2015-06-10 ENCOUNTER — Other Ambulatory Visit: Payer: Self-pay | Admitting: Cardiology

## 2015-06-15 ENCOUNTER — Ambulatory Visit: Payer: BLUE CROSS/BLUE SHIELD | Admitting: Adult Health

## 2015-06-21 ENCOUNTER — Ambulatory Visit (INDEPENDENT_AMBULATORY_CARE_PROVIDER_SITE_OTHER): Payer: BLUE CROSS/BLUE SHIELD | Admitting: Adult Health

## 2015-06-21 ENCOUNTER — Encounter: Payer: Self-pay | Admitting: Adult Health

## 2015-06-21 VITALS — BP 142/96 | HR 67 | Ht 62.0 in | Wt 116.2 lb

## 2015-06-21 DIAGNOSIS — F0390 Unspecified dementia without behavioral disturbance: Secondary | ICD-10-CM | POA: Diagnosis not present

## 2015-06-21 MED ORDER — DONEPEZIL HCL 10 MG PO TABS: 10.0000 mg | ORAL_TABLET | Freq: Every day | ORAL | Status: DC

## 2015-06-21 MED ORDER — MEMANTINE HCL 10 MG PO TABS: 10.0000 mg | ORAL_TABLET | Freq: Two times a day (BID) | ORAL | Status: DC

## 2015-06-21 NOTE — Progress Notes (Signed)
I have read the note, and I agree with the clinical assessment and plan.  WILLIS,CHARLES KEITH   
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
## 2015-06-21 NOTE — Patient Instructions (Signed)
Continue Aricept and Namenda  Memory score is stable If your symptoms worsen or you develop new symptoms please let us know.   

## 2015-07-27 ENCOUNTER — Other Ambulatory Visit (HOSPITAL_COMMUNITY): Payer: Self-pay | Admitting: Endocrinology

## 2015-07-27 ENCOUNTER — Ambulatory Visit (HOSPITAL_COMMUNITY)
Admission: RE | Admit: 2015-07-27 | Discharge: 2015-07-27 | Disposition: A | Discharge status: Home / Self Care | Payer: BLUE CROSS/BLUE SHIELD | Source: Ambulatory Visit | Attending: Endocrinology | Admitting: Endocrinology

## 2015-07-27 DIAGNOSIS — M859 Disorder of bone density and structure, unspecified: Secondary | ICD-10-CM

## 2015-12-14 ENCOUNTER — Encounter: Payer: Self-pay | Admitting: Internal Medicine

## 2015-12-22 ENCOUNTER — Encounter: Payer: Self-pay | Admitting: Adult Health

## 2015-12-22 ENCOUNTER — Ambulatory Visit (INDEPENDENT_AMBULATORY_CARE_PROVIDER_SITE_OTHER): Payer: BLUE CROSS/BLUE SHIELD | Admitting: Adult Health

## 2015-12-22 VITALS — BP 108/83 | HR 65 | Resp 14 | Ht 62.0 in | Wt 113.0 lb

## 2015-12-22 DIAGNOSIS — R413 Other amnesia: Secondary | ICD-10-CM
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
## 2015-12-22 NOTE — Progress Notes (Signed)
I have read the note, and I agree with the clinical assessment and plan.  Latori Beggs KEITH   

## 2015-12-22 NOTE — Patient Instructions (Signed)
Memory score is stable. Continue Aricept and Namenda

## 2015-12-31 ENCOUNTER — Ambulatory Visit (INDEPENDENT_AMBULATORY_CARE_PROVIDER_SITE_OTHER): Payer: BLUE CROSS/BLUE SHIELD | Admitting: Gastroenterology

## 2015-12-31 ENCOUNTER — Encounter: Payer: Self-pay | Admitting: Gastroenterology

## 2015-12-31 ENCOUNTER — Other Ambulatory Visit: Payer: Self-pay

## 2015-12-31 DIAGNOSIS — Z1211 Encounter for screening for malignant neoplasm of colon: Secondary | ICD-10-CM | POA: Insufficient documentation

## 2015-12-31 MED ORDER — PEG 3350-KCL-NA BICARB-NACL 420 G PO SOLR: 4000.0000 mL | ORAL | 0 refills | Status: DC

## 2015-12-31 NOTE — Assessment & Plan Note (Addendum)
65 year old female referred by PCP for updated colonoscopy. No personal history of polyps or family history of colon cancer. Doing well with only occasional diarrhea related to dietary intake. No concerning lower GI symptoms. History of chronic nausea and vomiting much improved since retiring; anxiety and stress played a large role in prior symptomatology, and she also notes greasy food as a trigger. Overall, doing quite well from a GI perspective.   Proceed with TCS with Dr. Gala Romney in near future: the risks, benefits, and alternatives have been discussed with the patient in detail. The patient states understanding and desires to proceed. Will use standard sedation, as she did well with this previously.

## 2015-12-31 NOTE — Patient Instructions (Signed)
We have scheduled you for a colonoscopy in the near future.   I am glad you are doing so well!

## 2015-12-31 NOTE — Progress Notes (Signed)
Primary Care Physician:  Purvis Kilts, MD Primary Gastroenterologist:  Dr. Gala Romney   Chief Complaint  Patient presents with  . Colonoscopy  . Nausea    HPI:   Melanie Bradley is a 65 y.o. female presenting today at the request of her PCP to arrange a colonoscopy. She was last seen in 2013. Chronic history of N/V, chronic diarrhea. Last EGD in 2013 with small hiatal hernia, gastric mucosa of uncertain significance, negative H.pylori. Last colonoscopy in 2005 by Dr. Laural Golden with diverticula. She is overdue for screening. 2013 evaluation: negative H.pylori stool antigen, TSH normal, celiac serologies normal. Cortisol normal. GES had been ordered but never completed. Felt to have psychosocial stressors as culprit for dyspepsia. Weight has remained in the 1teens for the past 5 years.   States N/V has improved now at time of visit since last seen in 2013. States she retired after 30 years and a lot has improved. Cooks different now, avoiding grease. As long as she doesn't have grease, does well. Knows now she can't eat grease. Working in the yard a lot now and enjoys it.    If eats something she shouldn't eat, will go "two or three times". Otherwise, has one or two BMs per day. No rectal bleeding.   Past Medical History:  Diagnosis Date  . Atrial fibrillation ([place])   . Bloating   . Diverticula of colon   . Epigastric burning sensation   . GERD (gastroesophageal reflux disease)   . Headache disorder 06/03/2014  . Headache(784.0)   . Helicobacter pylori gastritis 2004  . Hemorrhoids 06/26/03   Dr. Laural Golden tcs  . Hiatal hernia 06/26/03   Dr. Laural Golden egd  . Hypertension   . Memory difficulty 06/03/2014  . Spastic colon     Past Surgical History:  Procedure Laterality Date  . APPENDECTOMY    . CHOLECYSTECTOMY    . COLONOSCOPY  06/26/03   Dr. Kem Boroughs diverticula at the sigmoid colon with one of the transverse colon, normal terminal ileoscopy, small external hemorrhoids the  .  ESOPHAGOGASTRODUODENOSCOPY  03/15/2011   Dr. Trevor Iha hiatal hernia, abnormal gastric mucosa of unclear significance. Gastric biopsies negative, no H. pylori.Procedure: ESOPHAGOGASTRODUODENOSCOPY (EGD);  Surgeon: Daneil Dolin, MD;  Location: [facility];  Service: Endoscopy;  Laterality: N/A;  7:30  . TUBAL LIGATION    . UPPER GASTROINTESTINAL ENDOSCOPY  06/26/03   Dr. Wynelle Bourgeois sliding hiatal hernia with 8 mm tongue of gastric type mucosa at distal esophagus bile in the stomach.    Current Outpatient Prescriptions  Medication Sig Dispense Refill  . aspirin 81 MG tablet Take 81 mg by mouth daily.    . butalbital-aspirin-caffeine (FIORINAL) 50-325-40 MG per capsule Take 1 capsule by mouth 2 (two) times daily as needed for headache.    . donepezil (ARICEPT) 10 MG tablet Take 1 tablet (10 mg total) by mouth at bedtime. 30 tablet 11  . escitalopram (LEXAPRO) 10 MG tablet Take 1 tablet by mouth daily.  0  . levothyroxine (SYNTHROID, LEVOTHROID) 100 MCG tablet Take 100 mcg by mouth at bedtime.    Marland Kitchen losartan-hydrochlorothiazide (HYZAAR) 100-12.5 MG tablet   5  . memantine (NAMENDA) 10 MG tablet Take 1 tablet (10 mg total) by mouth 2 (two) times daily. 60 tablet 11   No current facility-administered medications for this visit.     Allergies as of 12/31/2015 - Review Complete 12/31/2015  Allergen Reaction Noted  . Benadryl [diphenhydramine hcl] Hives 12/02/2010  . Codeine Other (See  Comments) 07/28/2010  . Msm [methylsulfonylmethane] Hives 06/02/2014  . Nubain [nalbuphine hcl] Hives 06/02/2014  . Penicillins Hives and Swelling 07/28/2010  . Sulfa antibiotics Hives 07/28/2010    Family History  Problem Relation Age of Onset  . Hypertension Mother   . Angina Mother   . COPD Father   . Heart failure Father   . Cancer Sister   . Dementia Sister   . Cardiomyopathy Brother   . Pneumonia Other   . Migraines Sister     Social History   Social History  . Marital status:  Married    Spouse name: N/A  . Number of children: N/A  . Years of education: 49   Occupational History  .     Social History Main Topics  . Smoking status: Current Every Day Smoker    Packs/day: 0.50    Years: 25.00    Types: Cigarettes    Start date: 09/10/1966  . Smokeless tobacco: Never Used     Comment: Smokes 6 cigarettes daily  . Alcohol use No  . Drug use: No  . Sexual activity: Yes   Other Topics Concern  . Not on file   Social History Narrative   Patient is right handed.   Patient drinks 2 cups of caffeine daily.       Review of Systems: Gen: Denies any fever, chills, fatigue, weight loss, lack of appetite.  CV: Denies chest pain, heart palpitations, peripheral edema, syncope.  Resp: Denies shortness of breath at rest or with exertion. Denies wheezing or cough.  GI: see HPI  GU : Denies urinary burning, urinary frequency, urinary hesitancy MS: Denies joint pain, muscle weakness, cramps, or limitation of movement.  Derm: Denies rash, itching, dry skin Psych: short-term memory impairment mild  Heme: Denies bruising, bleeding, and enlarged lymph nodes.  Physical Exam: BP 125/81   Pulse 65   Temp 97.3 F (36.3 C) (Oral)   Ht 5' 2.5" (1.588 m)   Wt 112 lb 6.4 oz (51 kg)   BMI 20.23 kg/m  General:   Alert and oriented. Pleasant and cooperative. Well-nourished and well-developed.  Head:  Normocephalic and atraumatic. Eyes:  Without icterus, sclera clear and conjunctiva pink.  Ears:  Normal auditory acuity. Nose:  No deformity, discharge,  or lesions. Mouth:  No deformity or lesions, oral mucosa pink.  Lungs:  Clear to auscultation bilaterally. No wheezes, rales, or rhonchi. No distress.  Heart:  S1, S2 present without murmurs appreciated.  Abdomen:  +BS, soft, non-tender and non-distended. No HSM noted. No guarding or rebound. No masses appreciated.  Rectal:  Deferred  Msk:  Symmetrical without gross deformities. Normal posture. Extremities:  Without   edema. Neurologic:  Alert and  oriented x4;  grossly normal neurologically. Psych:  Alert and cooperative. Normal mood and affect.

## 2016-01-03 NOTE — Progress Notes (Signed)
CC'ED TO PCP 

## 2016-01-19 ENCOUNTER — Telehealth: Payer: Self-pay

## 2016-01-19 NOTE — Telephone Encounter (Signed)
Pt called office and was confused about prep for TCS tomorrow. She started clear liquids yesterday. Pt's husband found instructions. Reviewed instructions with husband. Advised to take 2 Dulcolax now, then start drinking prep in 1-2 hours.

## 2016-01-20 ENCOUNTER — Ambulatory Visit (HOSPITAL_COMMUNITY)
Admission: RE | Admit: 2016-01-20 | Discharge: 2016-01-20 | Disposition: A | Discharge status: Home Health | Payer: BLUE CROSS/BLUE SHIELD | Source: Ambulatory Visit | Attending: Internal Medicine | Admitting: Internal Medicine

## 2016-01-20 ENCOUNTER — Encounter (HOSPITAL_COMMUNITY)
Admission: RE | Disposition: A | Discharge status: Home Health | Payer: Self-pay | Source: Ambulatory Visit | Attending: Internal Medicine

## 2016-01-20 ENCOUNTER — Encounter (HOSPITAL_COMMUNITY): Payer: Self-pay | Admitting: *Deleted

## 2016-01-20 DIAGNOSIS — K5730 Diverticulosis of large intestine without perforation or abscess without bleeding: Secondary | ICD-10-CM | POA: Insufficient documentation

## 2016-01-20 DIAGNOSIS — I10 Essential (primary) hypertension: Secondary | ICD-10-CM | POA: Diagnosis not present

## 2016-01-20 DIAGNOSIS — Z1211 Encounter for screening for malignant neoplasm of colon: Secondary | ICD-10-CM | POA: Diagnosis not present

## 2016-01-20 DIAGNOSIS — F17210 Nicotine dependence, cigarettes, uncomplicated: Secondary | ICD-10-CM | POA: Diagnosis not present

## 2016-01-20 DIAGNOSIS — Z79899 Other long term (current) drug therapy: Secondary | ICD-10-CM | POA: Diagnosis not present

## 2016-01-20 DIAGNOSIS — Z1212 Encounter for screening for malignant neoplasm of rectum: Secondary | ICD-10-CM | POA: Diagnosis not present

## 2016-01-20 DIAGNOSIS — K219 Gastro-esophageal reflux disease without esophagitis: Secondary | ICD-10-CM | POA: Diagnosis not present

## 2016-01-20 DIAGNOSIS — I4891 Unspecified atrial fibrillation: Secondary | ICD-10-CM | POA: Diagnosis not present

## 2016-01-20 DIAGNOSIS — K449 Diaphragmatic hernia without obstruction or gangrene: Secondary | ICD-10-CM | POA: Diagnosis not present

## 2016-01-20 DIAGNOSIS — Z7982 Long term (current) use of aspirin: Secondary | ICD-10-CM | POA: Insufficient documentation

## 2016-01-20 HISTORY — PX: COLONOSCOPY: SHX5424

## 2016-01-20 SURGERY — COLONOSCOPY
Anesthesia: Moderate Sedation

## 2016-01-20 MED ORDER — ONDANSETRON HCL 4 MG/2ML IJ SOLN
INTRAMUSCULAR | Status: AC
  Filled 2016-01-20: qty 2

## 2016-01-20 MED ORDER — MIDAZOLAM HCL 5 MG/5ML IJ SOLN
INTRAMUSCULAR | Status: AC
  Filled 2016-01-20: qty 10

## 2016-01-20 MED ORDER — SODIUM CHLORIDE 0.9 % IV SOLN
INTRAVENOUS | Status: DC
  Administered 2016-01-20: 15:00:00 via INTRAVENOUS

## 2016-01-20 MED ORDER — STERILE WATER FOR IRRIGATION IR SOLN
Status: DC | PRN
  Administered 2016-01-20: 16:00:00

## 2016-01-20 MED ORDER — ONDANSETRON HCL 4 MG/2ML IJ SOLN
INTRAMUSCULAR | Status: DC | PRN
  Administered 2016-01-20: 4 mg via INTRAVENOUS

## 2016-01-20 MED ORDER — MIDAZOLAM HCL 5 MG/5ML IJ SOLN
INTRAMUSCULAR | Status: DC | PRN
Start: 2016-01-20 — End: 2016-01-20
  Administered 2016-01-20 (×2): 1 mg via INTRAVENOUS
  Administered 2016-01-20: 2 mg via INTRAVENOUS

## 2016-01-20 MED ORDER — MEPERIDINE HCL 100 MG/ML IJ SOLN
INTRAMUSCULAR | Status: DC | PRN
  Administered 2016-01-20: 25 mg via INTRAVENOUS
  Administered 2016-01-20: 50 mg via INTRAVENOUS

## 2016-01-20 MED ORDER — MEPERIDINE HCL 100 MG/ML IJ SOLN
INTRAMUSCULAR | Status: DC
Start: 2016-01-20 — End: 2016-01-20
  Filled 2016-01-20: qty 2

## 2016-01-20 NOTE — H&P (View-Only) (Signed)
Primary Care Physician:  Purvis Kilts, MD Primary Gastroenterologist:  Dr. Gala Romney   Chief Complaint  Patient presents with  . Colonoscopy  . Nausea    HPI:   Melanie Bradley is a 65 y.o. female presenting today at the request of her PCP to arrange a colonoscopy. She was last seen in 2013. Chronic history of N/V, chronic diarrhea. Last EGD in 2013 with small hiatal hernia, gastric mucosa of uncertain significance, negative H.pylori. Last colonoscopy in 2005 by Dr. Laural Golden with diverticula. She is overdue for screening. 2013 evaluation: negative H.pylori stool antigen, TSH normal, celiac serologies normal. Cortisol normal. GES had been ordered but never completed. Felt to have psychosocial stressors as culprit for dyspepsia. Weight has remained in the 1teens for the past 5 years.   States N/V has improved now at time of visit since last seen in 2013. States she retired after 30 years and a lot has improved. Cooks different now, avoiding grease. As long as she doesn't have grease, does well. Knows now she can't eat grease. Working in the yard a lot now and enjoys it.    If eats something she shouldn't eat, will go "two or three times". Otherwise, has one or two BMs per day. No rectal bleeding.   Past Medical History:  Diagnosis Date  . Atrial fibrillation ([place])   . Bloating   . Diverticula of colon   . Epigastric burning sensation   . GERD (gastroesophageal reflux disease)   . Headache disorder 06/03/2014  . Headache(784.0)   . Helicobacter pylori gastritis 2004  . Hemorrhoids 06/26/03   Dr. Laural Golden tcs  . Hiatal hernia 06/26/03   Dr. Laural Golden egd  . Hypertension   . Memory difficulty 06/03/2014  . Spastic colon     Past Surgical History:  Procedure Laterality Date  . APPENDECTOMY    . CHOLECYSTECTOMY    . COLONOSCOPY  06/26/03   Dr. Kem Boroughs diverticula at the sigmoid colon with one of the transverse colon, normal terminal ileoscopy, small external hemorrhoids the  .  ESOPHAGOGASTRODUODENOSCOPY  03/15/2011   Dr. Trevor Iha hiatal hernia, abnormal gastric mucosa of unclear significance. Gastric biopsies negative, no H. pylori.Procedure: ESOPHAGOGASTRODUODENOSCOPY (EGD);  Surgeon: Daneil Dolin, MD;  Location: [facility];  Service: Endoscopy;  Laterality: N/A;  7:30  . TUBAL LIGATION    . UPPER GASTROINTESTINAL ENDOSCOPY  06/26/03   Dr. Wynelle Bourgeois sliding hiatal hernia with 8 mm tongue of gastric type mucosa at distal esophagus bile in the stomach.    Current Outpatient Prescriptions  Medication Sig Dispense Refill  . aspirin 81 MG tablet Take 81 mg by mouth daily.    . butalbital-aspirin-caffeine (FIORINAL) 50-325-40 MG per capsule Take 1 capsule by mouth 2 (two) times daily as needed for headache.    . donepezil (ARICEPT) 10 MG tablet Take 1 tablet (10 mg total) by mouth at bedtime. 30 tablet 11  . escitalopram (LEXAPRO) 10 MG tablet Take 1 tablet by mouth daily.  0  . levothyroxine (SYNTHROID, LEVOTHROID) 100 MCG tablet Take 100 mcg by mouth at bedtime.    Marland Kitchen losartan-hydrochlorothiazide (HYZAAR) 100-12.5 MG tablet   5  . memantine (NAMENDA) 10 MG tablet Take 1 tablet (10 mg total) by mouth 2 (two) times daily. 60 tablet 11   No current facility-administered medications for this visit.     Allergies as of 12/31/2015 - Review Complete 12/31/2015  Allergen Reaction Noted  . Benadryl [diphenhydramine hcl] Hives 12/02/2010  . Codeine Other (See  Comments) 07/28/2010  . Msm [methylsulfonylmethane] Hives 06/02/2014  . Nubain [nalbuphine hcl] Hives 06/02/2014  . Penicillins Hives and Swelling 07/28/2010  . Sulfa antibiotics Hives 07/28/2010    Family History  Problem Relation Age of Onset  . Hypertension Mother   . Angina Mother   . COPD Father   . Heart failure Father   . Cancer Sister   . Dementia Sister   . Cardiomyopathy Brother   . Pneumonia Other   . Migraines Sister     Social History   Social History  . Marital status:  Married    Spouse name: N/A  . Number of children: N/A  . Years of education: 48   Occupational History  .     Social History Main Topics  . Smoking status: Current Every Day Smoker    Packs/day: 0.50    Years: 25.00    Types: Cigarettes    Start date: 09/10/1966  . Smokeless tobacco: Never Used     Comment: Smokes 6 cigarettes daily  . Alcohol use No  . Drug use: No  . Sexual activity: Yes   Other Topics Concern  . Not on file   Social History Narrative   Patient is right handed.   Patient drinks 2 cups of caffeine daily.       Review of Systems: Gen: Denies any fever, chills, fatigue, weight loss, lack of appetite.  CV: Denies chest pain, heart palpitations, peripheral edema, syncope.  Resp: Denies shortness of breath at rest or with exertion. Denies wheezing or cough.  GI: see HPI  GU : Denies urinary burning, urinary frequency, urinary hesitancy MS: Denies joint pain, muscle weakness, cramps, or limitation of movement.  Derm: Denies rash, itching, dry skin Psych: short-term memory impairment mild  Heme: Denies bruising, bleeding, and enlarged lymph nodes.  Physical Exam: BP 125/81   Pulse 65   Temp 97.3 F (36.3 C) (Oral)   Ht 5' 2.5" (1.588 m)   Wt 112 lb 6.4 oz (51 kg)   BMI 20.23 kg/m  General:   Alert and oriented. Pleasant and cooperative. Well-nourished and well-developed.  Head:  Normocephalic and atraumatic. Eyes:  Without icterus, sclera clear and conjunctiva pink.  Ears:  Normal auditory acuity. Nose:  No deformity, discharge,  or lesions. Mouth:  No deformity or lesions, oral mucosa pink.  Lungs:  Clear to auscultation bilaterally. No wheezes, rales, or rhonchi. No distress.  Heart:  S1, S2 present without murmurs appreciated.  Abdomen:  +BS, soft, non-tender and non-distended. No HSM noted. No guarding or rebound. No masses appreciated.  Rectal:  Deferred  Msk:  Symmetrical without gross deformities. Normal posture. Extremities:  Without   edema. Neurologic:  Alert and  oriented x4;  grossly normal neurologically. Psych:  Alert and cooperative. Normal mood and affect.

## 2016-01-20 NOTE — Interval H&P Note (Signed)
History and Physical Interval Note:  01/20/2016 3:31 PM  Melanie Bradley  has presented today for surgery, with the diagnosis of screening  The various methods of treatment have been discussed with the patient and family. After consideration of risks, benefits and other options for treatment, the patient has consented to  Procedure(s) with comments: COLONOSCOPY (N/A) - 2:30 pm as a surgical intervention .  The patient's history has been reviewed, patient examined, no change in status, stable for surgery.  I have reviewed the patient's chart and labs.  Questions were answered to the patient's satisfaction.        Melanie Bradley  No change. Screening colonoscopy per plan.  The risks, benefits, limitations, alternatives and imponderables have been reviewed with the patient. Questions have been answered. All parties are agreeable.

## 2016-01-20 NOTE — Op Note (Signed)
[facility] Patient Name: Melanie Bradley Procedure Date: 01/20/2016 2:34 PM MRN: [id number] Date of Birth: 1951/01/05 Attending MD: Norvel Richards , MD CSN: [id number] Age: 65 Admit Type: Outpatient Procedure:                Colonoscopy Indications:              Screening for colorectal malignant neoplasm Providers:                Norvel Richards, MD, Janeece Riggers, RN, Randa Spike, Technician, [place]is Peak B. Sharon Seller, RN Referring MD:              Medicines:                Midazolam 4 mg IV, Meperidine 75 mg IV, Ondansetron                            4 mg IV Complications:            No immediate complications. Estimated Blood Loss:     Estimated blood loss: none. Procedure:                Pre-Anesthesia Assessment:                           - Prior to the procedure, a History and Physical                            was performed, and patient medications and                            allergies were reviewed. The patient's tolerance of                            previous anesthesia was also reviewed. The risks                            and benefits of the procedure and the sedation                            options and risks were discussed with the patient.                            All questions were answered, and informed consent                            was obtained. Prior Anticoagulants: The patient has                            taken no previous anticoagulant or antiplatelet                            agents. ASA Grade Assessment: III - A patient with  severe systemic disease. After reviewing the risks                            and benefits, the patient was deemed in                            satisfactory condition to undergo the procedure.                           After obtaining informed consent, the colonoscope                            was passed under direct vision. Throughout the            procedure, the patient's blood pressure, pulse, and                            oxygen saturations were monitored continuously. The                            [id number] scope was introduced through                            the anus and advanced to the the cecum, identified                            by appendiceal orifice and ileocecal valve. The                            colonoscopy was performed without difficulty. The                            patient tolerated the procedure well. The quality                            of the bowel preparation was adequate. The entire                            colon was well visualized. The ileocecal valve,                            appendiceal orifice, and rectum were photographed. Scope In: 3:41:18 PM Scope Out: 3:59:23 PM Scope Withdrawal Time: 0 hours 6 minutes 52 seconds  Total Procedure Duration: 0 hours 18 minutes 5 seconds  Findings:      The perianal and digital rectal examinations were normal.      Scattered small and large-mouthed diverticula were found in the sigmoid       colon, descending colon and transverse colon.      The exam was otherwise without abnormality on direct and retroflexion       views. Impression:               - Diverticulosis in the sigmoid colon, in the  descending colon and in the transverse colon.                           - The examination was otherwise normal on direct                            and retroflexion views.                           - No specimens collected. Moderate Sedation:      Moderate (conscious) sedation was administered by the endoscopy nurse       and supervised by the endoscopist. The following parameters were       monitored: oxygen saturation, heart rate, blood pressure, respiratory       rate, EKG, adequacy of pulmonary ventilation, and response to care.       Total physician intraservice time was 23 minutes. Recommendation:           -  Patient has a contact number available for                            emergencies. The signs and symptoms of potential                            delayed complications were discussed with the                            patient. Return to normal activities tomorrow.                            Written discharge instructions were provided to the                            patient.                           - Resume previous diet.                           - Continue present medications.                           - Repeat colonoscopy in 10 years for screening                            purposes.                           - Return to GI office PRN. Procedure Code(s):        --- Professional ---                           [phone], Colonoscopy, flexible; diagnostic, including                            collection of specimen(s) by brushing or washing,  when performed (separate procedure)                           99152, Moderate sedation services provided by the                            same physician or other qualified health care                            professional performing the diagnostic or                            therapeutic service that the sedation supports,                            requiring the presence of an independent trained                            observer to assist in the monitoring of the                            patient's level of consciousness and physiological                            status; initial 15 minutes of intraservice time,                            patient age 50 years or older                           [phone], Moderate sedation services; each additional                            15 minutes intraservice time Diagnosis Code(s):        --- Professional ---                           Z12.11, Encounter for screening for malignant                            neoplasm of colon                           K57.30, Diverticulosis of  large intestine without                            perforation or abscess without bleeding CPT copyright 2016 [employer]. All rights reserved. The codes documented in this report are preliminary and upon coder review may  be revised to meet current compliance requirements. Cristopher Estimable. Abdulahi Schor, MD Norvel Richards, MD 01/20/2016 4:06:11 PM This report has been signed electronically. Number of Addenda: 0

## 2016-01-20 NOTE — Discharge Instructions (Addendum)
Colonoscopy Discharge Instructions  Read the instructions outlined below and refer to this sheet in the next few weeks. These discharge instructions provide you with general information on caring for yourself after you leave the hospital. Your doctor may also give you specific instructions. While your treatment has been planned according to the most current medical practices available, unavoidable complications occasionally occur. If you have any problems or questions after discharge, call Dr. Gala Romney at [phone]. ACTIVITY  You may resume your regular activity, but move at a slower pace for the next 24 hours.   Take frequent rest periods for the next 24 hours.   Walking will help get rid of the air and reduce the bloated feeling in your belly (abdomen).   No driving for 24 hours (because of the medicine (anesthesia) used during the test).    Do not sign any important legal documents or operate any machinery for 24 hours (because of the anesthesia used during the test).  NUTRITION  Drink plenty of fluids.   You may resume your normal diet as instructed by your doctor.   Begin with a light meal and progress to your normal diet. Heavy or fried foods are harder to digest and may make you feel sick to your stomach (nauseated).   Avoid alcoholic beverages for 24 hours or as instructed.  MEDICATIONS  You may resume your normal medications unless your doctor tells you otherwise.  WHAT YOU CAN EXPECT TODAY  Some feelings of bloating in the abdomen.   Passage of more gas than usual.   Spotting of blood in your stool or on the toilet paper.  IF YOU HAD POLYPS REMOVED DURING THE COLONOSCOPY:  No aspirin products for 7 days or as instructed.   No alcohol for 7 days or as instructed.   Eat a soft diet for the next 24 hours.  FINDING OUT THE RESULTS OF YOUR TEST Not all test results are available during your visit. If your test results are not back during the visit, make an appointment  with your caregiver to find out the results. Do not assume everything is normal if you have not heard from your caregiver or the medical facility. It is important for you to follow up on all of your test results.  SEEK IMMEDIATE MEDICAL ATTENTION IF:  You have more than a spotting of blood in your stool.   Your belly is swollen (abdominal distention).   You are nauseated or vomiting.   You have a temperature over 101.   You have abdominal pain or discomfort that is severe or gets worse throughout the day.    Diverticulosis information provided  Repeat screening colonoscopy in 10 years     Diverticulosis Diverticulosis is the condition that develops when small pouches (diverticula) form in the wall of your colon. Your colon, or large intestine, is where water is absorbed and stool is formed. The pouches form when the inside layer of your colon pushes through weak spots in the outer layers of your colon. CAUSES  No one knows exactly what causes diverticulosis. RISK FACTORS  Being older than 81. Your risk for this condition increases with age. Diverticulosis is rare in people younger than 40 years. By age 52, almost everyone has it.  Eating a low-fiber diet.  Being frequently constipated.  Being overweight.  Not getting enough exercise.  Smoking.  Taking over-the-counter pain medicines, like aspirin and ibuprofen. SYMPTOMS  Most people with diverticulosis do not have symptoms. DIAGNOSIS  Because diverticulosis often  has no symptoms, health care providers often discover the condition during an exam for other colon problems. In many cases, a health care provider will diagnose diverticulosis while using a flexible scope to examine the colon (colonoscopy). TREATMENT  If you have never developed an infection related to diverticulosis, you may not need treatment. If you have had an infection before, treatment may include:  Eating more fruits, vegetables, and grains.  Taking a  fiber supplement.  Taking a live bacteria supplement (probiotic).  Taking medicine to relax your colon. HOME CARE INSTRUCTIONS   Drink at least 6-8 glasses of water each day to prevent constipation.  Try not to strain when you have a bowel movement.  Keep all follow-up appointments. If you have had an infection before:  Increase the fiber in your diet as directed by your health care provider or dietitian.  Take a dietary fiber supplement if your health care provider approves.  Only take medicines as directed by your health care provider. SEEK MEDICAL CARE IF:   You have abdominal pain.  You have bloating.  You have cramps.  You have not gone to the bathroom in 3 days. SEEK IMMEDIATE MEDICAL CARE IF:   Your pain gets worse.  Yourbloating becomes very bad.  You have a fever or chills, and your symptoms suddenly get worse.  You begin vomiting.  You have bowel movements that are bloody or black. MAKE SURE YOU:  Understand these instructions.  Will watch your condition.  Will get help right away if you are not doing well or get worse. This information is not intended to replace advice given to you by your health care provider. Make sure you discuss any questions you have with your health care provider. Document Released: 10/28/2003 Document Revised: 02/04/2013 Document Reviewed: 12/25/2012 Elsevier Interactive Patient Education  2017 [employer].

## 2016-01-25 ENCOUNTER — Encounter (HOSPITAL_COMMUNITY): Payer: Self-pay | Admitting: Internal Medicine

## 2016-05-11 ENCOUNTER — Emergency Department (HOSPITAL_COMMUNITY): Payer: BLUE CROSS/BLUE SHIELD

## 2016-05-11 ENCOUNTER — Encounter (HOSPITAL_COMMUNITY): Payer: Self-pay

## 2016-05-11 ENCOUNTER — Emergency Department (HOSPITAL_COMMUNITY)
Admission: EM | Admit: 2016-05-11 | Discharge: 2016-05-11 | Disposition: A | Discharge status: Home / Self Care | Payer: BLUE CROSS/BLUE SHIELD | Attending: Emergency Medicine | Admitting: Emergency Medicine

## 2016-05-11 DIAGNOSIS — W19XXXA Unspecified fall, initial encounter: Secondary | ICD-10-CM

## 2016-05-11 DIAGNOSIS — J449 Chronic obstructive pulmonary disease, unspecified: Secondary | ICD-10-CM | POA: Diagnosis not present

## 2016-05-11 DIAGNOSIS — F17210 Nicotine dependence, cigarettes, uncomplicated: Secondary | ICD-10-CM | POA: Insufficient documentation

## 2016-05-11 DIAGNOSIS — Y929 Unspecified place or not applicable: Secondary | ICD-10-CM | POA: Insufficient documentation

## 2016-05-11 DIAGNOSIS — I10 Essential (primary) hypertension: Secondary | ICD-10-CM | POA: Diagnosis not present

## 2016-05-11 DIAGNOSIS — W1839XA Other fall on same level, initial encounter: Secondary | ICD-10-CM | POA: Insufficient documentation

## 2016-05-11 DIAGNOSIS — S42302A Unspecified fracture of shaft of humerus, left arm, initial encounter for closed fracture: Secondary | ICD-10-CM | POA: Insufficient documentation

## 2016-05-11 DIAGNOSIS — Y999 Unspecified external cause status: Secondary | ICD-10-CM | POA: Insufficient documentation

## 2016-05-11 DIAGNOSIS — S42212A Unspecified displaced fracture of surgical neck of left humerus, initial encounter for closed fracture: Secondary | ICD-10-CM

## 2016-05-11 DIAGNOSIS — S52502A Unspecified fracture of the lower end of left radius, initial encounter for closed fracture: Secondary | ICD-10-CM | POA: Diagnosis not present

## 2016-05-11 DIAGNOSIS — Y9389 Activity, other specified: Secondary | ICD-10-CM | POA: Diagnosis not present

## 2016-05-11 DIAGNOSIS — S52592A Other fractures of lower end of left radius, initial encounter for closed fracture: Secondary | ICD-10-CM

## 2016-05-11 DIAGNOSIS — S6992XA Unspecified injury of left wrist, hand and finger(s), initial encounter: Secondary | ICD-10-CM | POA: Diagnosis present

## 2016-05-11 MED ORDER — ONDANSETRON 8 MG PO TBDP
8.0000 mg | ORAL_TABLET | Freq: Once | ORAL | Status: AC
  Administered 2016-05-11: 8 mg via ORAL
  Filled 2016-05-11: qty 1

## 2016-05-11 MED ORDER — HYDROMORPHONE HCL 1 MG/ML IJ SOLN: 1.0000 mg | Freq: Once | INTRAMUSCULAR | Status: DC

## 2016-05-11 MED ORDER — HYDROCODONE-ACETAMINOPHEN 5-325 MG PO TABS: 1.0000 | ORAL_TABLET | ORAL | 0 refills | Status: DC | PRN

## 2016-05-11 MED ORDER — FENTANYL CITRATE (PF) 100 MCG/2ML IJ SOLN
50.0000 ug | Freq: Once | INTRAMUSCULAR | Status: AC
  Administered 2016-05-11: 50 ug via INTRAMUSCULAR
  Filled 2016-05-11: qty 2

## 2016-05-11 MED ORDER — HYDROCODONE-ACETAMINOPHEN 5-325 MG PO TABS
1.0000 | ORAL_TABLET | Freq: Once | ORAL | Status: AC
  Administered 2016-05-11: 1 via ORAL
  Filled 2016-05-11: qty 1

## 2016-05-11 NOTE — Discharge Instructions (Signed)
Per Dr. Aline Brochure, you will need to have surgery on your shoulder as we discussed today. He will see you in his office on Tuesday to arrange this.  Ice your shoulder and your wrist as much as is comfortable for the next 2 days to help with pain and swelling.  Wear your sling at all times to protect your shoulder injury.  You may take the hydrocodone prescribed for pain relief.  This will make you drowsy - do not drive within 4 hours of taking this medication.

## 2016-05-11 NOTE — ED Provider Notes (Signed)
[facility] DEPT Provider Note   CSN: [id number] Arrival date & time: 05/11/16  1318     History   Chief Complaint Chief Complaint  Patient presents with  . Arm Pain    HPI Melanie Bradley is a 66 y.o. female with past medical history as outlined below presenting with left wrist and shoulder pain and deformity after falling off her riding lawn mower.  She was stepping onto the mower when she lost her balance and fell backward landing on her left side.  She denies head, neck, back and lower extremity pain. She denies loc.  She has had no treatment prior to arrival for her injury.  The history is provided by the patient.    Past Medical History:  Diagnosis Date  . Atrial fibrillation ([place])   . Bloating   . Diverticula of colon   . Epigastric burning sensation   . GERD (gastroesophageal reflux disease)   . Headache disorder 06/03/2014  . Headache(784.0)   . Helicobacter pylori gastritis 2004  . Hemorrhoids 06/26/03   Dr. Laural Golden tcs  . Hiatal hernia 06/26/03   Dr. Laural Golden egd  . Hypertension   . Memory difficulty 06/03/2014  . Spastic colon     Patient Active Problem List   Diagnosis Date Noted  . Encounter for screening colonoscopy 12/31/2015  . Memory difficulty 06/03/2014  . Headache disorder 06/03/2014  . Atrial flutter ([place]) 05/09/2013  . COPD (chronic obstructive pulmonary disease) ([place]) 05/09/2013  . Tobacco abuse 05/08/2013  . LBBB (left bundle branch block)- intermittent 05/08/2013  . Chronic diarrhea 05/11/2011  . Chronic nausea 05/11/2011  . Dyspepsia 02/24/2011  . IBS (irritable bowel syndrome) 02/24/2011  . Chest pain 12/05/2010  . Hypertension 12/05/2010    Past Surgical History:  Procedure Laterality Date  . APPENDECTOMY    . CHOLECYSTECTOMY    . COLONOSCOPY  06/26/03   Dr. Kem Boroughs diverticula at the sigmoid colon with one of the transverse colon, normal terminal ileoscopy, small external hemorrhoids the  . COLONOSCOPY N/A 01/20/2016   Procedure: COLONOSCOPY;  Surgeon: Daneil Dolin, MD;  Location: [facility];  Service: Endoscopy;  Laterality: N/A;  2:30 pm  . ESOPHAGOGASTRODUODENOSCOPY  03/15/2011   Dr. Trevor Iha hiatal hernia, abnormal gastric mucosa of unclear significance. Gastric biopsies negative, no H. pylori.Procedure: ESOPHAGOGASTRODUODENOSCOPY (EGD);  Surgeon: Daneil Dolin, MD;  Location: [facility];  Service: Endoscopy;  Laterality: N/A;  7:30  . TUBAL LIGATION    . UPPER GASTROINTESTINAL ENDOSCOPY  06/26/03   Dr. Wynelle Bourgeois sliding hiatal hernia with 8 mm tongue of gastric type mucosa at distal esophagus bile in the stomach.    OB History    No data available       Home Medications    Prior to Admission medications   Medication Sig Start Date End Date Taking? Authorizing Provider  aspirin 81 MG tablet Take 81 mg by mouth daily.   Yes Historical Provider, MD  donepezil (ARICEPT) 10 MG tablet Take 1 tablet (10 mg total) by mouth at bedtime. 06/21/15  Yes Ward Givens, NP  escitalopram (LEXAPRO) 10 MG tablet Take 10 mg by mouth daily.  10/16/14  Yes Historical Provider, MD  levothyroxine (SYNTHROID, LEVOTHROID) 100 MCG tablet Take 100 mcg by mouth at bedtime. **BRAND NAME ONLY**   Yes Historical Provider, MD  losartan-hydrochlorothiazide (HYZAAR) 100-12.5 MG tablet Take 1 tablet by mouth daily.  12/20/15  Yes Historical Provider, MD  memantine (NAMENDA) 10 MG tablet Take 1 tablet (10 mg total)  by mouth 2 (two) times daily. Patient taking differently: Take 10 mg by mouth daily.  06/21/15  Yes Ward Givens, NP  HYDROcodone-acetaminophen (NORCO/VICODIN) 5-325 MG tablet Take 1 tablet by mouth every 4 (four) hours as needed. 05/11/16   Evalee Jefferson, PA-C    Family History Family History  Problem Relation Age of Onset  . Hypertension Mother   . Angina Mother   . COPD Father   . Heart failure Father   . Cancer Sister   . Dementia Sister   . Cardiomyopathy Brother   . Pneumonia Other   . Migraines  Sister   . Colon cancer Neg Hx     Social History Social History  Substance Use Topics  . Smoking status: Current Every Day Smoker    Packs/day: 0.50    Years: 25.00    Types: Cigarettes    Start date: 09/10/1966  . Smokeless tobacco: Never Used     Comment: Smokes 6 cigarettes daily  . Alcohol use No     Allergies   Benadryl [diphenhydramine hcl]; Codeine; Msm [methylsulfonylmethane]; Nubain [nalbuphine hcl]; Penicillins; and Sulfa antibiotics   Review of Systems Review of Systems  Constitutional: Negative for fever.  Musculoskeletal: Positive for arthralgias and joint swelling. Negative for myalgias.  Neurological: Negative for weakness and numbness.     Physical Exam Updated Vital Signs BP 97/64 (BP Location: Right Arm)   Pulse 76   Temp 97.8 F (36.6 C) (Oral)   Resp 18   Ht 5\' 2"  (1.575 m)   Wt 50.8 kg   SpO2 100%   BMI 20.49 kg/m   Physical Exam  Constitutional: She appears well-developed and well-nourished.  HENT:  Head: Normocephalic and atraumatic.  Neck: Normal range of motion. No spinous process tenderness and no muscular tenderness present. No edema present.  Cardiovascular: Regular rhythm and normal heart sounds.   Pulses:      Radial pulses are 2+ on the right side, and 2+ on the left side.  Pulses equal bilaterally  Musculoskeletal: She exhibits tenderness.       Left shoulder: She exhibits bony tenderness, swelling and deformity. She exhibits normal pulse.  ttp left upper arm with obvious deformity.  Humeral head palpable anterior to the glenoid.  Early bruising noted upper anterior arm.  Dorsal edema also left wrist.  Pt can flex/ext fingers without finger or hand pain.  Distal sensation intact.  Skin intact.   Elbow and forearm nontender.  Neurological: She is alert. She has normal strength. She displays normal reflexes. No sensory deficit.  Skin: Skin is warm and dry.  Psychiatric: She has a normal mood and affect.     ED Treatments /  Results  Labs (all labs ordered are listed, but only abnormal results are displayed) Labs Reviewed - No data to display  EKG  EKG Interpretation None       Radiology Dg Wrist Complete Left  Result Date: 05/11/2016 CLINICAL DATA:  Left wrist pain after fall today. EXAM: LEFT WRIST - COMPLETE 3+ VIEW COMPARISON:  None. FINDINGS: Moderately angulated fracture is seen involving the distal left radius. Probable old fracture is seen involving the ulnar styloid. Joint spaces are intact. No soft tissue abnormality is noted. IMPRESSION: Moderately angulated distal left radial fracture. Electronically Signed   By: Marijo Conception, M.D.   On: 05/11/2016 14:29   Dg Shoulder Left  Result Date: 05/11/2016 CLINICAL DATA:  Left shoulder pain after fall today. EXAM: LEFT SHOULDER - 2+ VIEW COMPARISON:  None. FINDINGS: Severely displaced and probably comminuted fracture is seen involving the left humeral neck. Left humeral head is situated within the glenoid fossa. Visualized ribs appear normal. IMPRESSION: Severely displaced and probably comminuted proximal left humeral neck fracture. Electronically Signed   By: Marijo Conception, M.D.   On: 05/11/2016 14:31    Procedures Procedures (including critical care time)  Medications Ordered in ED Medications  ondansetron (ZOFRAN-ODT) disintegrating tablet 8 mg (8 mg Oral Given 05/11/16 1404)  fentaNYL (SUBLIMAZE) injection 50 mcg (50 mcg Intramuscular Given 05/11/16 1405)  HYDROcodone-acetaminophen (NORCO/VICODIN) 5-325 MG per tablet 1 tablet (1 tablet Oral Given 05/11/16 1730)     Initial Impression / Assessment and Plan / ED Course  I have reviewed the triage vital signs and the nursing notes.  Pertinent labs & imaging results that were available during my care of the patient were reviewed by me and considered in my medical decision making (see chart for details).   Pt was given fentanyl IM , zofran odt prior to imaging.  Sling placed.  She obtained  adequate pain relief with this tx.    14:45 - call placed to Dr. Aline Brochure, in surgery, case described.  Will consult after has had a chance to review images.   5:33 PM Call from Dr. Aline Brochure, advised sling/immobilizer, sugar tong.  She will need shoulder surgery, may not need surgery for wrist.  She was prescribed hydrocodone, advised ice tx.  Strict return precautions discussed.  Pt will call for f/u appt with Dr Aline Brochure on Tuesday, per Dr. Ruthe Mannan rec.    Final Clinical Impressions(s) / ED Diagnoses   Final diagnoses:  Fracture of humerus neck, left, closed, initial encounter  Other closed fracture of distal end of left radius, initial encounter    New Prescriptions New Prescriptions   HYDROCODONE-ACETAMINOPHEN (NORCO/VICODIN) 5-325 MG TABLET    Take 1 tablet by mouth every 4 (four) hours as needed.     Evalee Jefferson, PA-C 05/11/16 [place], PA-C 05/11/16 [place], MD 05/12/16 [phone]

## 2016-05-11 NOTE — ED Notes (Signed)
Patient given discharge instruction, verbalized understand.  Patient in wheelchair out of the department.  

## 2016-05-11 NOTE — ED Triage Notes (Addendum)
Pt climbing onto lawn mower and loss balance and fell backwards. Landing on left shoulder /arm. Deformity noted to left upper arm

## 2016-05-11 NOTE — ED Notes (Signed)
Sugar tong splint and ice pack applied, two Rn's and PA at the bedside to assist, Shoulder immobilizer on.

## 2016-05-16 ENCOUNTER — Ambulatory Visit (INDEPENDENT_AMBULATORY_CARE_PROVIDER_SITE_OTHER): Payer: BLUE CROSS/BLUE SHIELD | Admitting: Orthopedic Surgery

## 2016-05-16 VITALS — BP 115/74 | HR 84 | Ht 61.0 in | Wt 115.0 lb

## 2016-05-16 DIAGNOSIS — S42292A Other displaced fracture of upper end of left humerus, initial encounter for closed fracture: Secondary | ICD-10-CM | POA: Diagnosis not present

## 2016-05-16 DIAGNOSIS — S52532A Colles' fracture of left radius, initial encounter for closed fracture: Secondary | ICD-10-CM

## 2016-05-16 MED ORDER — HYDROCODONE-ACETAMINOPHEN 5-325 MG PO TABS: 1.0000 | ORAL_TABLET | ORAL | 0 refills | Status: DC | PRN

## 2016-05-16 NOTE — Progress Notes (Signed)
Patient ID: Melanie Bradley, female   DOB: 12/20/1950, 66 y.o.   MRN: [id number]  Chief Complaint  Patient presents with  . Arm Pain    ER follow up on left shoulder fracture, DOI 05-11-16.    HPI Melanie Bradley is a 66 y.o. female.  66 year old female fell at her house injured her left shoulder and left wrist  Initial complaints of severe dull aching nonradiating pain left shoulder and left wrist subsided with splinting and oral pain medication  Mechanism of injury was a simple mechanical fall  Review of Systems Review of Systems  Constitutional: Negative for fever and unexpected weight change.  Gastrointestinal: Negative.   Genitourinary: Negative.   Neurological: Negative for numbness.   (2 MINIMUM)  Past Medical History:  Diagnosis Date  . Atrial fibrillation ([place])   . Bloating   . Diverticula of colon   . Epigastric burning sensation   . GERD (gastroesophageal reflux disease)   . Headache disorder 06/03/2014  . Headache(784.0)   . Helicobacter pylori gastritis 2004  . Hemorrhoids 06/26/03   Dr. Laural Golden tcs  . Hiatal hernia 06/26/03   Dr. Laural Golden egd  . Hypertension   . Memory difficulty 06/03/2014  . Spastic colon     Past Surgical History:  Procedure Laterality Date  . APPENDECTOMY    . CHOLECYSTECTOMY    . COLONOSCOPY  06/26/03   Dr. Kem Boroughs diverticula at the sigmoid colon with one of the transverse colon, normal terminal ileoscopy, small external hemorrhoids the  . COLONOSCOPY N/A 01/20/2016   Procedure: COLONOSCOPY;  Surgeon: Daneil Dolin, MD;  Location: [facility];  Service: Endoscopy;  Laterality: N/A;  2:30 pm  . ESOPHAGOGASTRODUODENOSCOPY  03/15/2011   Dr. Trevor Iha hiatal hernia, abnormal gastric mucosa of unclear significance. Gastric biopsies negative, no H. pylori.Procedure: ESOPHAGOGASTRODUODENOSCOPY (EGD);  Surgeon: Daneil Dolin, MD;  Location: [facility];  Service: Endoscopy;  Laterality: N/A;  7:30  . TUBAL LIGATION    . UPPER  GASTROINTESTINAL ENDOSCOPY  06/26/03   Dr. Wynelle Bourgeois sliding hiatal hernia with 8 mm tongue of gastric type mucosa at distal esophagus bile in the stomach.    Social History Social History  Substance Use Topics  . Smoking status: Current Every Day Smoker    Packs/day: 0.50    Years: 25.00    Types: Cigarettes    Start date: 09/10/1966  . Smokeless tobacco: Never Used     Comment: Smokes 6 cigarettes daily  . Alcohol use No    Allergies  Allergen Reactions  . Benadryl [Diphenhydramine Hcl] Hives  . Codeine Other (See Comments)    Refuses to take  . Msm [Methylsulfonylmethane] Hives  . Nubain [Nalbuphine Hcl] Hives  . Penicillins Hives and Swelling    Has patient had a PCN reaction causing immediate rash, facial/tongue/throat swelling, SOB or lightheadedness with hypotension: Yes Has patient had a PCN reaction causing severe rash involving mucus membranes or skin necrosis: No Has patient had a PCN reaction that required hospitalization No Has patient had a PCN reaction occurring within the last 10 years: No If all of the above answers are "NO", then may proceed with Cephalosporin use.   . Sulfa Antibiotics Hives    No outpatient prescriptions have been marked as taking for the 05/16/16 encounter (Office Visit) with Carole Civil, MD.      Physical Exam Physical Exam 1.BP 115/74   Pulse 84   Ht 5\' 1"  (1.549 m)   Wt 115 lb (52.2 kg)  BMI 21.73 kg/m   2. Gen. appearance. The patient is well-developed and well-nourished, grooming and hygiene are normal. There are no gross congenital abnormalities  3. The patient is alert and oriented to person place and time  4. Mood and affect are normal  5. Ambulation normal   Examination reveals the following: 6. On inspection we find swelling of the proximal humerus which is mild there is tenderness over the proximal humerus there is no visible deformity. Passive range of motion limited by pain and is minimal in all  directions. There is no instability of the shoulder joint muscle tone over the left deltoid is normal skin is intact with no blisters. Sensation is normal over the left deltoid and the fingertips of the left hand  Left wrist was left in the splint finger visible and examined all the fingers move normally capillary refill color normal sensation normal    MEDICAL DECISION MAKING:    Data Reviewed Plain films left shoulder show displaced proximal humerus fracture  dorsal angulation left distal radius fracture  Assessment Encounter Diagnoses  Name Primary?  . Closed Colles' fracture of left radius, initial encounter Yes  . Other closed displaced fracture of proximal end of left humerus, initial encounter      Plan Patient is referred to shoulder specialist for open treatment internal fixation left occipital humerus versus hemiarthroplasty  Meds ordered this encounter  Medications  . HYDROcodone-acetaminophen (NORCO/VICODIN) 5-325 MG tablet    Sig: Take 1 tablet by mouth every 4 (four) hours as needed.    Dispense:  30 tablet    Refill:  [place] controlled substance reporting system reviewed   Arther Abbott 05/16/2016, 12:07 PM

## 2016-05-18 ENCOUNTER — Encounter (INDEPENDENT_AMBULATORY_CARE_PROVIDER_SITE_OTHER): Payer: Self-pay | Admitting: Orthopedic Surgery

## 2016-05-18 ENCOUNTER — Other Ambulatory Visit (INDEPENDENT_AMBULATORY_CARE_PROVIDER_SITE_OTHER): Payer: Self-pay | Admitting: Orthopedic Surgery

## 2016-05-18 ENCOUNTER — Ambulatory Visit (INDEPENDENT_AMBULATORY_CARE_PROVIDER_SITE_OTHER): Payer: BLUE CROSS/BLUE SHIELD | Admitting: Orthopedic Surgery

## 2016-05-18 DIAGNOSIS — S52532A Colles' fracture of left radius, initial encounter for closed fracture: Secondary | ICD-10-CM | POA: Diagnosis not present

## 2016-05-18 DIAGNOSIS — S42202A Unspecified fracture of upper end of left humerus, initial encounter for closed fracture: Secondary | ICD-10-CM | POA: Diagnosis not present

## 2016-05-19 NOTE — Progress Notes (Signed)
Office Visit Note   Patient: Melanie Bradley           Date of Birth: 21-Nov-1950           MRN: [id number] Visit Date: 05/18/2016 Requested by: Sharilyn Sites, MD [address] [place], [place] [address] PCP: Purvis Kilts, MD  Subjective: Chief Complaint  Patient presents with  . Left Shoulder - Injury, Fracture  . Left Wrist - Fracture, Injury    HPI: Melanie Bradley is a 66 year old patient who injured herself 05/11/2016.  She fell backwards off trying to get on the lawnmower.  There is no loss of consciousness.  She reports only left wrist and shoulder pain.  She has been evaluated and diagnosed with left proximal humerus fracture and left wrist fracture.  She is right-hand dominant.  She is taking Norco for pain.  She is retired Network engineer.  She doesn't enjoy doing gardening and golf.  She does not have diabetes and no heart issues but she is a smoker.              ROS: All systems reviewed are negative as they relate to the chief complaint within the history of present illness.  Patient denies  fevers or chills.   Assessment & Plan: Visit Diagnoses:  1. Closed fracture of proximal end of left humerus, unspecified fracture morphology, initial encounter   2. Colles' fracture of left radius, initial encounter for closed fracture     Plan: Impression left displaced proximal humerus fracture and left displaced distal radius fracture.  Plan is open reduction internal fixation of both fractures.  Risks and benefits discussed including not limited to infection or vessel damage stiffness potential for nonunion malunion as well as the potential need for more surgery.  Patient understands risks and benefits and wishes to proceed.  All questions answered.  I think she is at risk of delayed union because of her smoking and that is explained to her.  Follow-Up Instructions: No Follow-up on file.   Orders:  No orders of the defined types were placed in this encounter.  No orders of the  defined types were placed in this encounter.     Procedures: No procedures performed   Clinical Data: No additional findings.  Objective: Vital Signs: There were no vitals taken for this visit.  Physical Exam:   Constitutional: Patient appears well-developed HEENT:  Head: Normocephalic Eyes:EOM are normal Neck: Normal range of motion Cardiovascular: Normal rate Pulmonary/chest: Effort normal Neurologic: Patient is alert Skin: Skin is warm Psychiatric: Patient has normal mood and affect    Ortho Exam: Orthopedic exam demonstrates that the patient is in a sling and wrist splint.  The risks and is removed.  Swelling is present but there is no fracture blisters.  Radial pulses intact.  EPL FPL interosseous function is intact.  Nose elbow swelling tenderness or ecchymosis.  She does have some ecchymosis of the proximal humeral region.  Deltoid does fire.  Specialty Comments:  No specialty comments available.  Imaging: No results found.   PMFS History: Patient Active Problem List   Diagnosis Date Noted  . Encounter for screening colonoscopy 12/31/2015  . Memory difficulty 06/03/2014  . Headache disorder 06/03/2014  . Atrial flutter ([place]) 05/09/2013  . COPD (chronic obstructive pulmonary disease) ([place]) 05/09/2013  . Tobacco abuse 05/08/2013  . LBBB (left bundle branch block)- intermittent 05/08/2013  . Chronic diarrhea 05/11/2011  . Chronic nausea 05/11/2011  . Dyspepsia 02/24/2011  . IBS (irritable bowel syndrome) 02/24/2011  .  Chest pain 12/05/2010  . Hypertension 12/05/2010   Past Medical History:  Diagnosis Date  . Atrial fibrillation ([place])   . Bloating   . Diverticula of colon   . Epigastric burning sensation   . GERD (gastroesophageal reflux disease)   . Headache disorder 06/03/2014  . Headache(784.0)   . Helicobacter pylori gastritis 2004  . Hemorrhoids 06/26/03   Dr. Laural Golden tcs  . Hiatal hernia 06/26/03   Dr. Laural Golden egd  . Hypertension   . Memory  difficulty 06/03/2014  . Spastic colon     Family History  Problem Relation Age of Onset  . Hypertension Mother   . Angina Mother   . COPD Father   . Heart failure Father   . Cancer Sister   . Dementia Sister   . Cardiomyopathy Brother   . Pneumonia Other   . Migraines Sister   . Colon cancer Neg Hx     Past Surgical History:  Procedure Laterality Date  . APPENDECTOMY    . CHOLECYSTECTOMY    . COLONOSCOPY  06/26/03   Dr. Kem Boroughs diverticula at the sigmoid colon with one of the transverse colon, normal terminal ileoscopy, small external hemorrhoids the  . COLONOSCOPY N/A 01/20/2016   Procedure: COLONOSCOPY;  Surgeon: Daneil Dolin, MD;  [facility];  Service: Endoscopy;  Laterality: N/A;  2:30 pm  . ESOPHAGOGASTRODUODENOSCOPY  03/15/2011   Dr. Trevor Iha hiatal hernia, abnormal gastric mucosa of unclear significance. Gastric biopsies negative, no H. pylori.Procedure: ESOPHAGOGASTRODUODENOSCOPY (EGD);  Surgeon: Daneil Dolin, MD;  [facility]: [facility];  Service: Endoscopy;  Laterality: N/A;  7:30  . TUBAL LIGATION    . UPPER GASTROINTESTINAL ENDOSCOPY  06/26/03   Dr. Wynelle Bourgeois sliding hiatal hernia with 8 mm tongue of gastric type mucosa at distal esophagus bile in the stomach.   Social History   Occupational History  .     Social History Main Topics  . Smoking status: Current Every Day Smoker    Packs/day: 0.50    Years: 25.00    Types: Cigarettes    Start date: 09/10/1966  . Smokeless tobacco: Never Used     Comment: Smokes 6 cigarettes daily  . Alcohol use No  . Drug use: No  . Sexual activity: Yes

## 2016-05-22 ENCOUNTER — Encounter (HOSPITAL_COMMUNITY): Payer: Self-pay | Admitting: *Deleted

## 2016-05-22 ENCOUNTER — Ambulatory Visit (HOSPITAL_COMMUNITY): Payer: BLUE CROSS/BLUE SHIELD | Admitting: Vascular Surgery

## 2016-05-22 MED ORDER — CLINDAMYCIN PHOSPHATE 900 MG/50ML IV SOLN
900.0000 mg | INTRAVENOUS | Status: DC
  Filled 2016-05-22 (×2): qty 50

## 2016-05-22 NOTE — H&P (Signed)
Melanie Bradley is an 66 y.o. female.   Chief Complaint: Left shoulder and arm pain HPI: Melanie Bradley is a 66 year old patient who injured herself 05/11/2016.  She fell backwards trying to get off of the lawnmower.  She denied any loss of consciousness at that time.  She describes left wrist and shoulder pain.  She has been evaluated and diagnosed with left proximal humerus fracture and left wrist fracture.  She is right-hand dominant.  She is taking Norco for pain.  She is a retired Network engineer.  She does enjoy gardening and golf.  She does not have diabetes and no heart issues but she is a smoker.  Past Medical History:  Diagnosis Date  . Atrial fibrillation ([place])   . Bloating   . Complication of anesthesia     " hard to wake up sometimes "  . Diverticula of colon   . Epigastric burning sensation   . GERD (gastroesophageal reflux disease)   . Headache disorder 06/03/2014  . Headache(784.0)   . Helicobacter pylori gastritis 2004  . Hemorrhoids 06/26/03   Dr. Laural Golden tcs  . Hiatal hernia 06/26/03   Dr. Laural Golden egd  . Hypertension   . Hypothyroidism   . 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 Memory difficulty 06/03/2014  . Shoulder fracture, left   . Spastic colon   . Wrist fracture     Past Surgical History:  Procedure Laterality Date  . APPENDECTOMY    . CHOLECYSTECTOMY    . COLONOSCOPY  06/26/03   Dr. Kem Boroughs diverticula at the sigmoid colon with one of the transverse colon, normal terminal ileoscopy, small external hemorrhoids the  . COLONOSCOPY N/A 01/20/2016   Procedure: COLONOSCOPY;  Surgeon: Daneil Dolin, MD;  [facility];  Service: Endoscopy;  Laterality: N/A;  2:30 pm  . ESOPHAGOGASTRODUODENOSCOPY  03/15/2011   Dr. Trevor Iha hiatal hernia, abnormal gastric mucosa of unclear significance. Gastric biopsies negative, no H. pylori.Procedure: ESOPHAGOGASTRODUODENOSCOPY (EGD);  Surgeon: Daneil Dolin, MD;  [facility];  Service:  Endoscopy;  Laterality: N/A;  7:30  . TUBAL LIGATION    . UPPER GASTROINTESTINAL ENDOSCOPY  06/26/03   Dr. Wynelle Bourgeois sliding hiatal hernia with 8 mm tongue of gastric type mucosa at distal esophagus bile in the stomach.    Family History  Problem Relation Age of Onset  . Hypertension Mother   . Angina Mother   . COPD Father   . Heart failure Father   . Cancer Sister   . Dementia Sister   . Cardiomyopathy Brother   . Pneumonia Other   . Migraines Sister   . Colon cancer Neg Hx    Social History:  reports that she has been smoking Cigarettes.  She started smoking about 49 years ago. She has a 12.50 pack-year smoking history. She has never used smokeless tobacco. She reports that she does not drink alcohol or use drugs.  Allergies:  Allergies  Allergen Reactions  . Penicillins Hives and Swelling    Has patient had a PCN reaction causing IMMEDIATE RASH, FACIAL/TONGUE/THROAT SWELLING, SOB, OR LIGHTHEADEDNESS WITH HYPOTENSION:  #  #  #  YES  #  #  #  Has patient had a PCN reaction causing severe rash involving mucus membranes or skin necrosis: No Has patient had a PCN reaction that required hospitalization No Has patient had a PCN reaction occurring within the last 10 years: No If all of the above answers are "NO", then may  proceed with Cephalosporin use.   . Benadryl [Diphenhydramine Hcl] Hives  . Msm [Methylsulfonylmethane] Hives  . Nubain [Nalbuphine Hcl] Hives  . Sulfa Antibiotics Hives  . Codeine     REFUSES TO TAKE    No prescriptions prior to admission.    No results found for this or any previous visit (from the past 48 hour(s)). No results found.  ROS All systems reviewed are negative as they relate to the chief complaint within the history of present illness.  Patient denies  fevers or chills.   There were no vitals taken for this visit. Physical Exam   Constitutional: Patient appears well-developed HEENT:  Head: Normocephalic Eyes:EOM are normal Neck:  Normal range of motion Cardiovascular: Normal rate Pulmonary/chest: Effort normal Neurologic: Patient is alert Skin: Skin is warm Psychiatric: Patient has normal mood and affect  Left arm exam demonstrates swelling and ecchymosis about the left wrist and left shoulder.  Deltoid does fire.  Radial pulses intact.  EPL FPL interosseous function intact.  No paresthesias in the median radial or ulnar distribution.  Elbow range of motion nontender on the left  Assessment/Plan Impression is left displaced proximal humerus fracture and left displaced distal radius fracture.  Plan is open reduction and internal fixation of both fractures.  Risk and benefits discussed including but not limited to infection nerve and vessel damage stiffness potential for nonunion and malunion as well as the potential need for more surgery.  Patient understands the risk and benefits and wishes to proceed.  All questions answered.  She may be at risk of delayed union because of her smoking and that is explained to her.  Anderson Malta, MD 05/22/2016, 6:53 PM

## 2016-05-22 NOTE — Progress Notes (Signed)
Anesthesia Chart Review: SAME DAY WORK-UP.  Patient is a 66 year old female scheduled for left ORIF proximal humerus fracture and ORIF left wrist fracture on 05/23/16 by Dr. Marlou Sa. She fell on 05/11/16 when stepping onto her riding mower. No LOC.    History includes smoking, HTN, hiatal hernia, GERD, H. Pylori gastritis, spastic colon, headaches, memory difficulty, hypothyroidism, cholecystectomy, appendectomy.  Of note, afib/flutter is listed on her history (and a few notes). Per PAT RN, patient could not elaborate on any details. In review of her records in [place], I see atrial flutter first mentioned in her discharge summary from 04/2013 hospitalization for chest pain with rate dependent left BBB--however, no progress notes or 12 lead EKG tracings from that admission showed afib or aflutter. In her two 2015 cardiology follow-up notes since then tracing were noted to be SR. (I cannot personally find tracing evidence of afib or aflutter in [facility] Epic.)  - PCP is listed as Dr. Sharilyn Sites. - She denied seeing a cardiologist, but was evaluated by Dr. Dorris Carnes on 12/02/10 for chest pain which was not felt to be cardiac in etiology. She was later seen by Dr. Sherren Mocha during admission for chest pain with new left BBB (intermittent; rate related LBBB) in 04/2013. She had a low risk stress test during that time (05/09/13). Her last visit was with Dr. Carlyle Dolly on 09/06/13 for epigastric pain (felt to be non-cardiac) and rate dependent left BBB. She denied CP, SOB during PAT RN phone interview 05/22/16. - Neurologist is Dr. Jannifer Franklin, last visit with Ward Givens, NP on 12/22/15. - GI is Dr. Gala Romney.   Her last EKG was in 2016, so will need an updated tracing prior to surgery.  Nuclear stress test 05/09/13: IMPRESSION: This is a low risk scan. There is no scar or ischemia. There is decreased activity in the septum, consistent with left bundle branch block. It is noted that the QRS was narrow at the  start of the study. However with Lexiscan, the patient developed tachycardia with left bundle branch block. Assessment shows that this was most probably sinus tachycardia which slowed over time.  Carotid U/S 10/05/11: IMPRESSION: No significant carotid bifurcation plaque or stenosis.  She is for labs and EKG on arrival. She is a same day work-up, so review of results and evaluation of patient by her anesthesiologist on the day of surgery.   [facility] Stay [facility]/Anesthesiology Phone [phone] 05/22/2016 2:56 PM

## 2016-05-22 NOTE — Progress Notes (Signed)
Pt SDW-Pre-op call completed by both pt and spouse, [name] ( on speaker phone) with pt verbal consent . Pt denies SOB, chest pain, and being under the care of a cardiologist. Pt stated that if an echo was performed " it was done a long time ago." Pt denies having a cardiac cath. Pt denies having an EKG and chest x ray within the last year. Pt made aware to stop taking Aspirin, vitamins, fish oil, Cognisense and herbal medications. Do not take any NSAIDs ie: Ibuprofen, Advil, Naproxen, BC and Goody Powder or any medication containing Aspirin. Spouse verbalized understanding of all pre-op instructions.

## 2016-05-23 ENCOUNTER — Encounter (HOSPITAL_COMMUNITY): Payer: Self-pay | Admitting: Anesthesiology

## 2016-05-23 ENCOUNTER — Encounter (HOSPITAL_COMMUNITY)
Admission: RE | Disposition: A | Discharge status: Home / Self Care | Payer: Self-pay | Source: Ambulatory Visit | Attending: Orthopedic Surgery

## 2016-05-23 ENCOUNTER — Ambulatory Visit (HOSPITAL_COMMUNITY)
Admission: RE | Admit: 2016-05-23 | Discharge: 2016-05-23 | Disposition: A | Discharge status: Home / Self Care | Payer: BLUE CROSS/BLUE SHIELD | Source: Ambulatory Visit | Attending: Orthopedic Surgery | Admitting: Orthopedic Surgery

## 2016-05-23 DIAGNOSIS — S52502A Unspecified fracture of the lower end of left radius, initial encounter for closed fracture: Secondary | ICD-10-CM | POA: Insufficient documentation

## 2016-05-23 DIAGNOSIS — S42202A Unspecified fracture of upper end of left humerus, initial encounter for closed fracture: Secondary | ICD-10-CM | POA: Diagnosis not present

## 2016-05-23 DIAGNOSIS — I4891 Unspecified atrial fibrillation: Secondary | ICD-10-CM | POA: Insufficient documentation

## 2016-05-23 DIAGNOSIS — Z79899 Other long term (current) drug therapy: Secondary | ICD-10-CM | POA: Diagnosis not present

## 2016-05-23 DIAGNOSIS — Z882 Allergy status to sulfonamides status: Secondary | ICD-10-CM | POA: Diagnosis not present

## 2016-05-23 DIAGNOSIS — I10 Essential (primary) hypertension: Secondary | ICD-10-CM | POA: Insufficient documentation

## 2016-05-23 DIAGNOSIS — W1789XA Other fall from one level to another, initial encounter: Secondary | ICD-10-CM | POA: Diagnosis not present

## 2016-05-23 DIAGNOSIS — M25512 Pain in left shoulder: Secondary | ICD-10-CM | POA: Diagnosis present

## 2016-05-23 DIAGNOSIS — Z880 Allergy status to penicillin: Secondary | ICD-10-CM | POA: Diagnosis not present

## 2016-05-23 DIAGNOSIS — Z7982 Long term (current) use of aspirin: Secondary | ICD-10-CM | POA: Diagnosis not present

## 2016-05-23 DIAGNOSIS — E039 Hypothyroidism, unspecified: Secondary | ICD-10-CM | POA: Insufficient documentation

## 2016-05-23 DIAGNOSIS — F17210 Nicotine dependence, cigarettes, uncomplicated: Secondary | ICD-10-CM | POA: Insufficient documentation

## 2016-05-23 DIAGNOSIS — J449 Chronic obstructive pulmonary disease, unspecified: Secondary | ICD-10-CM | POA: Diagnosis not present

## 2016-05-23 DIAGNOSIS — Z888 Allergy status to other drugs, medicaments and biological substances status: Secondary | ICD-10-CM | POA: Diagnosis not present

## 2016-05-23 DIAGNOSIS — Z5309 Procedure and treatment not carried out because of other contraindication: Secondary | ICD-10-CM | POA: Insufficient documentation

## 2016-05-23 HISTORY — DX: Adverse effect of unspecified anesthetic, initial encounter: T41.45XA

## 2016-05-23 HISTORY — DX: Hypothyroidism, unspecified: E03.9

## 2016-05-23 HISTORY — DX: Left bundle-branch block, unspecified: I44.7

## 2016-05-23 HISTORY — DX: Other complications of anesthesia, initial encounter: T88.59XA

## 2016-05-23 HISTORY — DX: Fracture of unspecified carpal bone, unspecified wrist, initial encounter for closed fracture: S62.109A

## 2016-05-23 HISTORY — DX: Fracture of left shoulder girdle, part unspecified, initial encounter for closed fracture: S42.92XA

## 2016-05-23 LAB — BASIC METABOLIC PANEL
Anion gap: 14 (ref 5–15)
BUN: 13 mg/dL (ref 6–20)
CO2: 36 mmol/L — ABNORMAL HIGH (ref 22–32)
CREATININE: 1.33 mg/dL — AB (ref 0.44–1.00)
Calcium: 9.6 mg/dL (ref 8.9–10.3)
Chloride: 78 mmol/L — ABNORMAL LOW (ref 101–111)
GFR calc Af Amer: 48 mL/min — ABNORMAL LOW (ref >60)
GFR calc non Af Amer: 42 mL/min — ABNORMAL LOW (ref >60)
GLUCOSE: 99 mg/dL (ref 65–99)
Potassium: 2.8 mmol/L — ABNORMAL LOW (ref 3.5–5.1)
SODIUM: 128 mmol/L — AB (ref 135–145)

## 2016-05-23 LAB — CBC
HCT: 31.7 % — ABNORMAL LOW (ref 36.0–46.0)
HEMOGLOBIN: 11.1 g/dL — AB (ref 12.0–15.0)
MCH: 31.4 pg (ref 26.0–34.0)
MCHC: 35 g/dL (ref 30.0–36.0)
MCV: 89.8 fL (ref 78.0–100.0)
Platelets: 445 10*3/uL — ABNORMAL HIGH (ref 150–400)
RBC: 3.53 MIL/uL — ABNORMAL LOW (ref 3.87–5.11)
RDW: 14.1 % (ref 11.5–15.5)
WBC: 11.5 10*3/uL — ABNORMAL HIGH (ref 4.0–10.5)

## 2016-05-23 SURGERY — OPEN REDUCTION INTERNAL FIXATION (ORIF) PROXIMAL HUMERUS FRACTURE
Anesthesia: General | Site: Shoulder | Laterality: Left

## 2016-05-23 MED ORDER — LACTATED RINGERS IV SOLN: INTRAVENOUS | Status: DC

## 2016-05-23 MED ORDER — FENTANYL CITRATE (PF) 100 MCG/2ML IJ SOLN
INTRAMUSCULAR | Status: AC
  Filled 2016-05-23: qty 2

## 2016-05-23 MED ORDER — CHLORHEXIDINE GLUCONATE 4 % EX LIQD: 60.0000 mL | Freq: Once | CUTANEOUS | Status: DC

## 2016-05-23 MED ORDER — LIDOCAINE 2% (20 MG/ML) 5 ML SYRINGE
INTRAMUSCULAR | Status: AC
  Filled 2016-05-23: qty 5

## 2016-05-23 MED ORDER — MIDAZOLAM HCL 2 MG/2ML IJ SOLN: 1.0000 mg | INTRAMUSCULAR | Status: DC | PRN

## 2016-05-23 MED ORDER — MIDAZOLAM HCL 2 MG/2ML IJ SOLN
INTRAMUSCULAR | Status: AC
  Filled 2016-05-23: qty 2

## 2016-05-23 MED ORDER — FENTANYL CITRATE (PF) 100 MCG/2ML IJ SOLN: 50.0000 ug | INTRAMUSCULAR | Status: DC | PRN

## 2016-05-23 MED ORDER — FENTANYL CITRATE (PF) 250 MCG/5ML IJ SOLN
INTRAMUSCULAR | Status: AC
  Filled 2016-05-23: qty 5

## 2016-05-23 MED ORDER — PROPOFOL 10 MG/ML IV BOLUS
INTRAVENOUS | Status: AC
  Filled 2016-05-23: qty 20

## 2016-05-23 MED ORDER — PHENYLEPHRINE 40 MCG/ML (10ML) SYRINGE FOR IV PUSH (FOR BLOOD PRESSURE SUPPORT)
PREFILLED_SYRINGE | INTRAVENOUS | Status: AC
  Filled 2016-05-23: qty 10

## 2016-05-23 NOTE — Anesthesia Preprocedure Evaluation (Addendum)
Anesthesia Evaluation  Patient identified by MRN, date of birth, ID band Patient awake    Reviewed: Allergy & Precautions, NPO status , Patient's Chart, lab work & pertinent test results  History of Anesthesia Complications (+) PROLONGED EMERGENCE  Airway Mallampati: II   Neck ROM: Full    Dental no notable dental hx. (+) Caps,    Pulmonary COPD, Current Smoker,    breath sounds clear to auscultation       Cardiovascular hypertension, + dysrhythmias  Rhythm:Regular Rate:Normal     Neuro/Psych    GI/Hepatic hiatal hernia, GERD  ,  Endo/Other  Hypothyroidism   Renal/GU      Musculoskeletal   Abdominal (+) - obese,   Peds  Hematology negative hematology ROS (+)   Anesthesia Other Findings   Reproductive/Obstetrics                            Anesthesia Physical Anesthesia Plan  ASA: III  Anesthesia Plan: General   Post-op Pain Management:  Regional for Post-op pain   Induction: Intravenous  Airway Management Planned: Oral ETT  Additional Equipment:   Intra-op Plan:   Post-operative Plan: Extubation in OR  Informed Consent:   Dental advisory given  Plan Discussed with:   Anesthesia Plan Comments:         Anesthesia Quick Evaluation

## 2016-05-23 NOTE — Progress Notes (Signed)
Patients' ekg shows left BBB.  New from last ekg done in March 2016.  Also, her electrolytes are low, potassium of 2.8.   Dr. Orene Desanctis states that surgery may be cancelled due to new current results.   Dr. Orene Desanctis spoke with her PCP Dr. Armandina Gemma.  I am faxing the results to her PCP. Dr. Marlou Sa made aware of situation and will see patient prior to discharge from hospital.

## 2016-05-25 ENCOUNTER — Encounter (HOSPITAL_COMMUNITY): Payer: Self-pay

## 2016-05-25 ENCOUNTER — Telehealth (INDEPENDENT_AMBULATORY_CARE_PROVIDER_SITE_OTHER): Payer: Self-pay | Admitting: Orthopedic Surgery

## 2016-05-25 ENCOUNTER — Emergency Department (HOSPITAL_COMMUNITY)
Admission: EM | Admit: 2016-05-25 | Discharge: 2016-05-25 | Disposition: A | Discharge status: Home / Self Care | Payer: BLUE CROSS/BLUE SHIELD | Attending: Emergency Medicine | Admitting: Emergency Medicine

## 2016-05-25 DIAGNOSIS — I10 Essential (primary) hypertension: Secondary | ICD-10-CM | POA: Diagnosis not present

## 2016-05-25 DIAGNOSIS — Z7982 Long term (current) use of aspirin: Secondary | ICD-10-CM | POA: Diagnosis not present

## 2016-05-25 DIAGNOSIS — E876 Hypokalemia: Secondary | ICD-10-CM | POA: Insufficient documentation

## 2016-05-25 DIAGNOSIS — E871 Hypo-osmolality and hyponatremia: Secondary | ICD-10-CM | POA: Diagnosis not present

## 2016-05-25 DIAGNOSIS — Z79899 Other long term (current) drug therapy: Secondary | ICD-10-CM | POA: Insufficient documentation

## 2016-05-25 DIAGNOSIS — F17210 Nicotine dependence, cigarettes, uncomplicated: Secondary | ICD-10-CM | POA: Insufficient documentation

## 2016-05-25 DIAGNOSIS — E039 Hypothyroidism, unspecified: Secondary | ICD-10-CM | POA: Diagnosis not present

## 2016-05-25 DIAGNOSIS — J449 Chronic obstructive pulmonary disease, unspecified: Secondary | ICD-10-CM | POA: Diagnosis not present

## 2016-05-25 LAB — CBC WITH DIFFERENTIAL/PLATELET
BASOS ABS: 0.1 10*3/uL (ref 0.0–0.1)
Basophils Relative: 1 %
Eosinophils Absolute: 0.2 10*3/uL (ref 0.0–0.7)
Eosinophils Relative: 2 %
HEMATOCRIT: 30.7 % — AB (ref 36.0–46.0)
Hemoglobin: 10.7 g/dL — ABNORMAL LOW (ref 12.0–15.0)
LYMPHS ABS: 1.9 10*3/uL (ref 0.7–4.0)
LYMPHS PCT: 20 %
MCH: 31.8 pg (ref 26.0–34.0)
MCHC: 34.9 g/dL (ref 30.0–36.0)
MCV: 91.4 fL (ref 78.0–100.0)
MONO ABS: 0.6 10*3/uL (ref 0.1–1.0)
Monocytes Relative: 7 %
NEUTROS ABS: 6.6 10*3/uL (ref 1.7–7.7)
Neutrophils Relative %: 70 %
Platelets: 464 10*3/uL — ABNORMAL HIGH (ref 150–400)
RBC: 3.36 MIL/uL — AB (ref 3.87–5.11)
RDW: 14.8 % (ref 11.5–15.5)
WBC: 9.4 10*3/uL (ref 4.0–10.5)

## 2016-05-25 LAB — COMPREHENSIVE METABOLIC PANEL
ALBUMIN: 3.4 g/dL — AB (ref 3.5–5.0)
ALT: 20 U/L (ref 14–54)
ANION GAP: 10 (ref 5–15)
AST: 23 U/L (ref 15–41)
Alkaline Phosphatase: 92 U/L (ref 38–126)
BILIRUBIN TOTAL: 0.2 mg/dL — AB (ref 0.3–1.2)
BUN: 14 mg/dL (ref 6–20)
CHLORIDE: 87 mmol/L — AB (ref 101–111)
CO2: 33 mmol/L — ABNORMAL HIGH (ref 22–32)
Calcium: 9.2 mg/dL (ref 8.9–10.3)
Creatinine, Ser: 1.32 mg/dL — ABNORMAL HIGH (ref 0.44–1.00)
GFR calc Af Amer: 49 mL/min — ABNORMAL LOW (ref >60)
GFR calc non Af Amer: 42 mL/min — ABNORMAL LOW (ref >60)
GLUCOSE: 103 mg/dL — AB (ref 65–99)
POTASSIUM: 3.2 mmol/L — AB (ref 3.5–5.1)
Sodium: 130 mmol/L — ABNORMAL LOW (ref 135–145)
TOTAL PROTEIN: 7 g/dL (ref 6.5–8.1)

## 2016-05-25 MED ORDER — POTASSIUM CHLORIDE CRYS ER 20 MEQ PO TBCR
40.0000 meq | EXTENDED_RELEASE_TABLET | Freq: Once | ORAL | Status: AC
  Administered 2016-05-25: 40 meq via ORAL
  Filled 2016-05-25: qty 2

## 2016-05-25 MED ORDER — SODIUM CHLORIDE 0.9 % IV BOLUS (SEPSIS)
1000.0000 mL | Freq: Once | INTRAVENOUS | Status: AC
  Administered 2016-05-25: 1000 mL via INTRAVENOUS

## 2016-05-25 MED ORDER — POTASSIUM CHLORIDE CRYS ER 20 MEQ PO TBCR: 20.0000 meq | EXTENDED_RELEASE_TABLET | Freq: Two times a day (BID) | ORAL | 0 refills | Status: DC

## 2016-05-25 NOTE — ED Notes (Signed)
Pt is in stable condition upon d/c and ambulates from ED. 

## 2016-05-25 NOTE — Telephone Encounter (Signed)
Patient's husband Melanie Bradley) called to let Dr Marlou Sa know that his wife has been admitted to [facility]. He advised they are trying to get her labs correct so Dr Marlou Sa can do her surgery. The number to contact Melanie Bradley is [phone]

## 2016-05-25 NOTE — Telephone Encounter (Signed)
FYI

## 2016-05-25 NOTE — ED Provider Notes (Signed)
[facility] DEPT Provider Note   CSN: [id number] Arrival date & time: 05/25/16  1208     History   Chief Complaint Chief Complaint  Patient presents with  . Abnormal Lab    HPI Melanie Bradley is a 66 y.o. female.  HPI  66 y.o. female with a hx of Atrial fibrillation, HTN, presents to the Emergency Department today due to abnormal laboratory work. Sent by PCP for eval. Pt was told her sodium and potassium were low. Pt with noted fracture to left arm and is scheduled to have surgery next week. Pt surgery delayed due to abnormal labs. Denies CP/SOB/ABD pain. No N/V/D. No numbness/tingling. No other symptoms noted.    Past Medical History:  Diagnosis Date  . Atrial fibrillation ([place])   . Bloating   . Complication of anesthesia     " hard to wake up sometimes "  . Diverticula of colon   . Epigastric burning sensation   . GERD (gastroesophageal reflux disease)   . Headache disorder 06/03/2014  . Headache(784.0)   . Helicobacter pylori gastritis 2004  . Hemorrhoids 06/26/03   Dr. Laural Golden tcs  . Hiatal hernia 06/26/03   Dr. Laural Golden egd  . Hypertension   . Hypothyroidism   . 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 Memory difficulty 06/03/2014  . Shoulder fracture, left   . Spastic colon   . Wrist fracture     Patient Active Problem List   Diagnosis Date Noted  . Encounter for screening colonoscopy 12/31/2015  . Memory difficulty 06/03/2014  . Headache disorder 06/03/2014  . Atrial flutter ([place]) 05/09/2013  . COPD (chronic obstructive pulmonary disease) ([place]) 05/09/2013  . Tobacco abuse 05/08/2013  . LBBB (left bundle branch block)- intermittent 05/08/2013  . Chronic diarrhea 05/11/2011  . Chronic nausea 05/11/2011  . Dyspepsia 02/24/2011  . IBS (irritable bowel syndrome) 02/24/2011  . Chest pain 12/05/2010  . Hypertension 12/05/2010    Past Surgical History:  Procedure Laterality Date  . APPENDECTOMY    . CHOLECYSTECTOMY    . COLONOSCOPY   06/26/03   Dr. Kem Boroughs diverticula at the sigmoid colon with one of the transverse colon, normal terminal ileoscopy, small external hemorrhoids the  . COLONOSCOPY N/A 01/20/2016   Procedure: COLONOSCOPY;  Surgeon: Daneil Dolin, MD;  Location: [facility];  Service: Endoscopy;  Laterality: N/A;  2:30 pm  . ESOPHAGOGASTRODUODENOSCOPY  03/15/2011   Dr. Trevor Iha hiatal hernia, abnormal gastric mucosa of unclear significance. Gastric biopsies negative, no H. pylori.Procedure: ESOPHAGOGASTRODUODENOSCOPY (EGD);  Surgeon: Daneil Dolin, MD;  Location: [facility];  Service: Endoscopy;  Laterality: N/A;  7:30  . TUBAL LIGATION    . UPPER GASTROINTESTINAL ENDOSCOPY  06/26/03   Dr. Wynelle Bourgeois sliding hiatal hernia with 8 mm tongue of gastric type mucosa at distal esophagus bile in the stomach.    OB History    No data available       Home Medications    Prior to Admission medications   Medication Sig Start Date End Date Taking? Authorizing Provider  aspirin 81 MG tablet Take 81 mg by mouth daily.    Historical Provider, MD  Cyanocobalamin (B-12 PO) Take 6,000 mcg by mouth daily.    Historical Provider, MD  donepezil (ARICEPT) 10 MG tablet Take 1 tablet (10 mg total) by mouth at bedtime. 06/21/15   Ward Givens, NP  escitalopram (LEXAPRO) 10 MG tablet Take 10 mg by mouth every evening.  10/16/14  Historical Provider, MD  HYDROcodone-acetaminophen (NORCO/VICODIN) 5-325 MG tablet Take 1 tablet by mouth every 4 (four) hours as needed. Patient taking differently: Take 1 tablet by mouth every 4 (four) hours as needed for moderate pain.  05/16/16   Carole Civil, MD  levothyroxine (SYNTHROID, LEVOTHROID) 100 MCG tablet Take 100 mcg by mouth daily. **BRAND NAME ONLY** DOES NOT TAKE ON FRIDAY    Historical Provider, MD  losartan-hydrochlorothiazide (HYZAAR) 100-12.5 MG tablet Take 1 tablet by mouth daily.  12/20/15   Historical Provider, MD  memantine (NAMENDA) 10 MG tablet Take 1 tablet  (10 mg total) by mouth 2 (two) times daily. 06/21/15   Ward Givens, NP  OVER THE COUNTER MEDICATION Take 1 tablet by mouth daily. cognisense otc supplement    Historical Provider, MD    Family History Family History  Problem Relation Age of Onset  . Hypertension Mother   . Angina Mother   . COPD Father   . Heart failure Father   . Cancer Sister   . Dementia Sister   . Cardiomyopathy Brother   . Pneumonia Other   . Migraines Sister   . Colon cancer Neg Hx     Social History Social History  Substance Use Topics  . Smoking status: Current Every Day Smoker    Packs/day: 0.50    Years: 25.00    Types: Cigarettes    Start date: 09/10/1966  . Smokeless tobacco: Never Used     Comment: smokes 1 cigarette a day if that  . Alcohol use No     Allergies   Penicillins; Benadryl [diphenhydramine hcl]; Msm [methylsulfonylmethane]; Nubain [nalbuphine hcl]; Sulfa antibiotics; and Codeine   Review of Systems Review of Systems ROS reviewed and all are negative for acute change except as noted in the HPI.  Physical Exam Updated Vital Signs BP (!) 122/99 (BP Location: Right Arm)   Pulse 91   Temp 98.3 F (36.8 C) (Oral)   Resp 16   SpO2 100%   Physical Exam  Constitutional: She is oriented to person, place, and time. Vital signs are normal. She appears well-developed and well-nourished.  HENT:  Head: Normocephalic and atraumatic.  Right Ear: Hearing normal.  Left Ear: Hearing normal.  Eyes: Conjunctivae and EOM are normal. Pupils are equal, round, and reactive to light.  Neck: Normal range of motion. Neck supple.  Cardiovascular: Normal rate, regular rhythm, normal heart sounds and intact distal pulses.   Pulmonary/Chest: Effort normal and breath sounds normal.  Abdominal: Soft. There is no tenderness.  Musculoskeletal: Normal range of motion.  Neurological: She is alert and oriented to person, place, and time.  Skin: Skin is warm and dry.  Psychiatric: She has a normal  mood and affect. Her speech is normal and behavior is normal. Thought content normal.  Nursing note and vitals reviewed.  ED Treatments / Results  Labs (all labs ordered are listed, but only abnormal results are displayed) Labs Reviewed  COMPREHENSIVE METABOLIC PANEL - Abnormal; Notable for the following:       Result Value   Sodium 130 (*)    Potassium 3.2 (*)    Chloride 87 (*)    CO2 33 (*)    Glucose, Bld 103 (*)    Creatinine, Ser 1.32 (*)    Albumin 3.4 (*)    Total Bilirubin 0.2 (*)    GFR calc non Af Amer 42 (*)    GFR calc Af Amer 49 (*)    All other components within normal  limits  CBC WITH DIFFERENTIAL/PLATELET - Abnormal; Notable for the following:    RBC 3.36 (*)    Hemoglobin 10.7 (*)    HCT 30.7 (*)    Platelets 464 (*)    All other components within normal limits    EKG  EKG Interpretation  Date/Time:  Thursday May 25 2016 15:29:41 EDT Ventricular Rate:  79 PR Interval:    QRS Duration: 137 QT Interval:  454 QTC Calculation: 521 R Axis:   10 Text Interpretation:  Sinus rhythm Left bundle branch block Baseline wander in lead(s) III No significant change since last tracing Confirmed by LITTLE MD, RACHEL ([id number]) on 05/25/2016 3:33:25 PM      Radiology No results found.  Procedures Procedures (including critical care time)  Medications Ordered in ED Medications  sodium chloride 0.9 % bolus 1,000 mL (1,000 mLs Intravenous New Bag/Given 05/25/16 1416)  potassium chloride SA (K-DUR,KLOR-CON) CR tablet 40 mEq (40 mEq Oral Given 05/25/16 1416)     Initial Impression / Assessment and Plan / ED Course  I have reviewed the triage vital signs and the nursing notes.  Pertinent labs & imaging results that were available during my care of the patient were reviewed by me and considered in my medical decision making (see chart for details).  Final Clinical Impressions(s) / ED Diagnoses  {I have reviewed and evaluated the relevant laboratory values.   {I have  reviewed the relevant previous healthcare records.  {I obtained HPI from historian.   ED Course:  Assessment: Pt is a 66 y.o. female with hx HTN who presents due to abnormal labs. Shows to have hyponatremia and hypokalemia. Abnormal EKG noted as well with BBB. On exam, pt in NAD. Nontoxic/nonseptic appearing. VSS. Afebrile. Lungs CTA. Heart RRR. Abdomen nontender soft. Labs with potassium 3.2. Sodium 130. Pt has been taking Kdur as directed. Given NS bolus as well as Potassium in ED. EKG with Left BBB. No active CP/SOB. Pt otherwise asymptomatic. EKG appears unchanged from previous in 2015. Plan is to DC home with follow up to PCP/ Given KDur. At time of discharge, Patient is in no acute distress. Vital Signs are stable. Patient is able to ambulate. Patient able to tolerate PO.   Disposition/Plan:  DC Home Additional Verbal discharge instructions given and discussed with patient.  Pt Instructed to f/u with PCP in the next week for evaluation and treatment of symptoms. Return precautions given Pt acknowledges and agrees with plan  Supervising Physician Sharlett Iles, MD  Final diagnoses:  Hypokalemia  Hyponatremia    New Prescriptions New Prescriptions   No medications on file     Shary Decamp, PA-C 05/25/16 [place], MD 05/25/16 1620

## 2016-05-25 NOTE — ED Triage Notes (Signed)
Pt sent here by PCP for abnormal labs. She was told her potassium and sodium were low. Pt has fracture to left arm and is to have surgery next week but they cannot operate until labs improve. Sent here for rehydration.

## 2016-05-25 NOTE — Discharge Instructions (Signed)
Please read and follow all provided instructions.  Your diagnoses today include:  1. Hypokalemia   2. Hyponatremia     Tests performed today include: Vital signs. See below for your results today.   Medications prescribed:  Take as prescribed   Home care instructions:  Follow any educational materials contained in this packet.  Follow-up instructions: Please follow-up with your primary care provider for further evaluation of symptoms and treatment   Return instructions:  Please return to the Emergency Department if you do not get better, if you get worse, or new symptoms OR  - Fever (temperature greater than 101.16F)  - Bleeding that does not stop with holding pressure to the area    -Severe pain (please note that you may be more sore the day after your accident)  - Chest Pain  - Difficulty breathing  - Severe nausea or vomiting  - Inability to tolerate food and liquids  - Passing out  - Skin becoming red around your wounds  - Change in mental status (confusion or lethargy)  - New numbness or weakness    Please return if you have any other emergent concerns.  Additional Information:  Your vital signs today were: BP 122/86    Pulse 83    Temp 98.3 F (36.8 C) (Oral)    Resp 16    SpO2 100%  If your blood pressure (BP) was elevated above 135/85 this visit, please have this repeated by your doctor within one month. ---------------

## 2016-05-25 NOTE — ED Notes (Addendum)
Pt states she is here to "get her labs straight". Pt states she needs to have surgery to repair her left arm and she needs to have her sodium and potassium corrected prior to the surgery.

## 2016-05-25 NOTE — ED Notes (Signed)
Name called for room assignment, pt was in bathroom.

## 2016-05-26 ENCOUNTER — Other Ambulatory Visit (INDEPENDENT_AMBULATORY_CARE_PROVIDER_SITE_OTHER): Payer: Self-pay | Admitting: Orthopedic Surgery

## 2016-05-26 ENCOUNTER — Encounter (HOSPITAL_COMMUNITY): Payer: Self-pay | Admitting: *Deleted

## 2016-05-26 DIAGNOSIS — S62102A Fracture of unspecified carpal bone, left wrist, initial encounter for closed fracture: Secondary | ICD-10-CM

## 2016-05-26 DIAGNOSIS — S4292XA Fracture of left shoulder girdle, part unspecified, initial encounter for closed fracture: Secondary | ICD-10-CM

## 2016-05-26 NOTE — Progress Notes (Addendum)
Anesthesia follow-up: SAME DAY WORK-UP.  See my note from 05/22/16. Patient was a same day work-up for ORIF left proximal humerus fracture and ORIF left wrist fracture on 05/23/16, but labs on arrival showed hypokalemia (K 2.8) and hyponatremia (Na 128). Anesthesiologist Dr. Orene Desanctis spoke with her PCP Dr. Hilma Favors, and surgery was postponed. Apparently, she was started on Kdur, but was also sent to the ED on 05/25/16 to have her hypokalemia and hyponatremia re-evaluated. She was given NS bolus with potassium. Labs showed Na 130, K 3.2. Stable Cr 1.32 since 05/23/16. H/H 10.7/30.7.   EKG 05/25/16: SR, left BBB. Left BBB is old. (See my previous note.)  Hypokalemia and hyponatremia improving. EKG is stable. Further evaluation on the day of surgery by her anesthesiologist to discuss the definitve anesthesia plan.  [facility] Stay [facility]/Anesthesiology Phone [phone] 05/26/2016 3:42 PM

## 2016-05-29 ENCOUNTER — Encounter (HOSPITAL_COMMUNITY)
Admission: RE | Disposition: A | Discharge status: Home / Self Care | Payer: Self-pay | Source: Ambulatory Visit | Attending: Orthopedic Surgery

## 2016-05-29 ENCOUNTER — Ambulatory Visit (HOSPITAL_COMMUNITY): Payer: BLUE CROSS/BLUE SHIELD

## 2016-05-29 ENCOUNTER — Ambulatory Visit (HOSPITAL_COMMUNITY): Payer: BLUE CROSS/BLUE SHIELD | Admitting: Vascular Surgery

## 2016-05-29 ENCOUNTER — Encounter (HOSPITAL_COMMUNITY): Payer: Self-pay | Admitting: Certified Registered Nurse Anesthetist

## 2016-05-29 ENCOUNTER — Ambulatory Visit (HOSPITAL_COMMUNITY)
Admission: RE | Admit: 2016-05-29 | Discharge: 2016-05-29 | Disposition: A | Discharge status: Home / Self Care | Payer: BLUE CROSS/BLUE SHIELD | Source: Ambulatory Visit | Attending: Orthopedic Surgery | Admitting: Orthopedic Surgery

## 2016-05-29 DIAGNOSIS — E039 Hypothyroidism, unspecified: Secondary | ICD-10-CM | POA: Insufficient documentation

## 2016-05-29 DIAGNOSIS — Z7982 Long term (current) use of aspirin: Secondary | ICD-10-CM | POA: Insufficient documentation

## 2016-05-29 DIAGNOSIS — Z880 Allergy status to penicillin: Secondary | ICD-10-CM | POA: Diagnosis not present

## 2016-05-29 DIAGNOSIS — I447 Left bundle-branch block, unspecified: Secondary | ICD-10-CM | POA: Insufficient documentation

## 2016-05-29 DIAGNOSIS — J449 Chronic obstructive pulmonary disease, unspecified: Secondary | ICD-10-CM | POA: Insufficient documentation

## 2016-05-29 DIAGNOSIS — S42202A Unspecified fracture of upper end of left humerus, initial encounter for closed fracture: Secondary | ICD-10-CM | POA: Insufficient documentation

## 2016-05-29 DIAGNOSIS — K219 Gastro-esophageal reflux disease without esophagitis: Secondary | ICD-10-CM | POA: Diagnosis not present

## 2016-05-29 DIAGNOSIS — S62102D Fracture of unspecified carpal bone, left wrist, subsequent encounter for fracture with routine healing: Secondary | ICD-10-CM | POA: Diagnosis not present

## 2016-05-29 DIAGNOSIS — Z882 Allergy status to sulfonamides status: Secondary | ICD-10-CM | POA: Insufficient documentation

## 2016-05-29 DIAGNOSIS — E871 Hypo-osmolality and hyponatremia: Secondary | ICD-10-CM | POA: Insufficient documentation

## 2016-05-29 DIAGNOSIS — Z419 Encounter for procedure for purposes other than remedying health state, unspecified: Secondary | ICD-10-CM

## 2016-05-29 DIAGNOSIS — I10 Essential (primary) hypertension: Secondary | ICD-10-CM | POA: Diagnosis not present

## 2016-05-29 DIAGNOSIS — K449 Diaphragmatic hernia without obstruction or gangrene: Secondary | ICD-10-CM | POA: Diagnosis not present

## 2016-05-29 DIAGNOSIS — E876 Hypokalemia: Secondary | ICD-10-CM | POA: Diagnosis not present

## 2016-05-29 DIAGNOSIS — W19XXXA Unspecified fall, initial encounter: Secondary | ICD-10-CM | POA: Diagnosis not present

## 2016-05-29 DIAGNOSIS — F17210 Nicotine dependence, cigarettes, uncomplicated: Secondary | ICD-10-CM | POA: Diagnosis not present

## 2016-05-29 DIAGNOSIS — I4891 Unspecified atrial fibrillation: Secondary | ICD-10-CM | POA: Insufficient documentation

## 2016-05-29 DIAGNOSIS — S42202D Unspecified fracture of upper end of left humerus, subsequent encounter for fracture with routine healing: Secondary | ICD-10-CM | POA: Diagnosis not present

## 2016-05-29 DIAGNOSIS — S4292XA Fracture of left shoulder girdle, part unspecified, initial encounter for closed fracture: Secondary | ICD-10-CM

## 2016-05-29 DIAGNOSIS — S62102A Fracture of unspecified carpal bone, left wrist, initial encounter for closed fracture: Secondary | ICD-10-CM

## 2016-05-29 DIAGNOSIS — S52502A Unspecified fracture of the lower end of left radius, initial encounter for closed fracture: Secondary | ICD-10-CM | POA: Insufficient documentation

## 2016-05-29 DIAGNOSIS — F0390 Unspecified dementia without behavioral disturbance: Secondary | ICD-10-CM | POA: Insufficient documentation

## 2016-05-29 HISTORY — PX: ORIF WRIST FRACTURE: SHX2133

## 2016-05-29 HISTORY — DX: Unspecified dementia, unspecified severity, without behavioral disturbance, psychotic disturbance, mood disturbance, and anxiety: F03.90

## 2016-05-29 HISTORY — PX: ORIF HUMERUS FRACTURE: SHX2126

## 2016-05-29 LAB — TYPE AND SCREEN
ABO/RH(D): A POS
ANTIBODY SCREEN: NEGATIVE

## 2016-05-29 LAB — ABO/RH: ABO/RH(D): A POS

## 2016-05-29 SURGERY — OPEN REDUCTION INTERNAL FIXATION (ORIF) PROXIMAL HUMERUS FRACTURE
Anesthesia: General | Site: Wrist | Laterality: Left

## 2016-05-29 MED ORDER — 0.9 % SODIUM CHLORIDE (POUR BTL) OPTIME
TOPICAL | Status: DC | PRN
  Administered 2016-05-29: 2000 mL
  Administered 2016-05-29 (×3): 1000 mL

## 2016-05-29 MED ORDER — VECURONIUM BROMIDE 10 MG IV SOLR
INTRAVENOUS | Status: DC | PRN
  Administered 2016-05-29 (×4): 2 mg via INTRAVENOUS

## 2016-05-29 MED ORDER — EPHEDRINE SULFATE-NACL 50-0.9 MG/10ML-% IV SOSY
PREFILLED_SYRINGE | INTRAVENOUS | Status: DC | PRN
  Administered 2016-05-29: 10 mg via INTRAVENOUS

## 2016-05-29 MED ORDER — ALBUMIN HUMAN 5 % IV SOLN
INTRAVENOUS | Status: DC | PRN
  Administered 2016-05-29 (×2): via INTRAVENOUS

## 2016-05-29 MED ORDER — FENTANYL CITRATE (PF) 250 MCG/5ML IJ SOLN
INTRAMUSCULAR | Status: AC
  Filled 2016-05-29: qty 5

## 2016-05-29 MED ORDER — ESMOLOL HCL 100 MG/10ML IV SOLN
INTRAVENOUS | Status: AC
  Filled 2016-05-29: qty 10

## 2016-05-29 MED ORDER — LIDOCAINE 2% (20 MG/ML) 5 ML SYRINGE
INTRAMUSCULAR | Status: DC | PRN
  Administered 2016-05-29: 100 mg via INTRAVENOUS

## 2016-05-29 MED ORDER — CHLORHEXIDINE GLUCONATE 4 % EX LIQD: 60.0000 mL | Freq: Once | CUTANEOUS | Status: DC

## 2016-05-29 MED ORDER — BUPIVACAINE-EPINEPHRINE (PF) 0.5% -1:200000 IJ SOLN
INTRAMUSCULAR | Status: DC | PRN
Start: 2016-05-29 — End: 2016-05-29
  Administered 2016-05-29: 20 mL via PERINEURAL

## 2016-05-29 MED ORDER — ESMOLOL HCL 100 MG/10ML IV SOLN
INTRAVENOUS | Status: DC | PRN
  Administered 2016-05-29: 25 mg via INTRAVENOUS
  Administered 2016-05-29: 15 mg via INTRAVENOUS

## 2016-05-29 MED ORDER — EPHEDRINE 5 MG/ML INJ
INTRAVENOUS | Status: AC
  Filled 2016-05-29: qty 10

## 2016-05-29 MED ORDER — HYDROMORPHONE HCL 1 MG/ML IJ SOLN: 0.2500 mg | INTRAMUSCULAR | Status: DC | PRN

## 2016-05-29 MED ORDER — PHENYLEPHRINE HCL 10 MG/ML IJ SOLN
INTRAVENOUS | Status: DC | PRN
  Administered 2016-05-29: 25 ug/min via INTRAVENOUS

## 2016-05-29 MED ORDER — CLINDAMYCIN PHOSPHATE 900 MG/50ML IV SOLN
900.0000 mg | INTRAVENOUS | Status: AC
  Administered 2016-05-29: 900 mg via INTRAVENOUS
  Filled 2016-05-29: qty 50

## 2016-05-29 MED ORDER — DEXAMETHASONE SODIUM PHOSPHATE 10 MG/ML IJ SOLN
INTRAMUSCULAR | Status: AC
  Filled 2016-05-29: qty 1

## 2016-05-29 MED ORDER — LIDOCAINE 2% (20 MG/ML) 5 ML SYRINGE
INTRAMUSCULAR | Status: AC
  Filled 2016-05-29: qty 5

## 2016-05-29 MED ORDER — PHENYLEPHRINE 40 MCG/ML (10ML) SYRINGE FOR IV PUSH (FOR BLOOD PRESSURE SUPPORT)
PREFILLED_SYRINGE | INTRAVENOUS | Status: DC | PRN
  Administered 2016-05-29: 120 ug via INTRAVENOUS
  Administered 2016-05-29: 80 ug via INTRAVENOUS

## 2016-05-29 MED ORDER — SODIUM CHLORIDE 0.9 % IJ SOLN
INTRAMUSCULAR | Status: AC
  Filled 2016-05-29: qty 10

## 2016-05-29 MED ORDER — PROMETHAZINE HCL 25 MG/ML IJ SOLN: 6.2500 mg | INTRAMUSCULAR | Status: DC | PRN

## 2016-05-29 MED ORDER — LACTATED RINGERS IV SOLN
INTRAVENOUS | Status: DC | PRN
  Administered 2016-05-29 (×2): via INTRAVENOUS

## 2016-05-29 MED ORDER — PROPOFOL 10 MG/ML IV BOLUS
INTRAVENOUS | Status: DC | PRN
  Administered 2016-05-29: 120 mg via INTRAVENOUS

## 2016-05-29 MED ORDER — ROCURONIUM BROMIDE 10 MG/ML (PF) SYRINGE
PREFILLED_SYRINGE | INTRAVENOUS | Status: DC | PRN
  Administered 2016-05-29: 10 mg via INTRAVENOUS
  Administered 2016-05-29: 40 mg via INTRAVENOUS

## 2016-05-29 MED ORDER — MIDAZOLAM HCL 2 MG/2ML IJ SOLN
INTRAMUSCULAR | Status: AC
  Administered 2016-05-29: 1 mg
  Filled 2016-05-29: qty 2

## 2016-05-29 MED ORDER — SUGAMMADEX SODIUM 200 MG/2ML IV SOLN
INTRAVENOUS | Status: DC | PRN
Start: 2016-05-29 — End: 2016-05-29
  Administered 2016-05-29: 102.6 mg via INTRAVENOUS

## 2016-05-29 MED ORDER — PROPOFOL 10 MG/ML IV BOLUS
INTRAVENOUS | Status: AC
  Filled 2016-05-29: qty 40

## 2016-05-29 MED ORDER — ROCURONIUM BROMIDE 50 MG/5ML IV SOSY
PREFILLED_SYRINGE | INTRAVENOUS | Status: AC
  Filled 2016-05-29: qty 5

## 2016-05-29 MED ORDER — FENTANYL CITRATE (PF) 100 MCG/2ML IJ SOLN
INTRAMUSCULAR | Status: AC
  Administered 2016-05-29: 50 ug
  Filled 2016-05-29: qty 2

## 2016-05-29 MED ORDER — FENTANYL CITRATE (PF) 100 MCG/2ML IJ SOLN
INTRAMUSCULAR | Status: DC | PRN
  Administered 2016-05-29: 50 ug via INTRAVENOUS
  Administered 2016-05-29: 25 ug via INTRAVENOUS
  Administered 2016-05-29: 50 ug via INTRAVENOUS
  Administered 2016-05-29: 25 ug via INTRAVENOUS
  Administered 2016-05-29: 50 ug via INTRAVENOUS

## 2016-05-29 MED ORDER — LACTATED RINGERS IV SOLN
INTRAVENOUS | Status: DC
  Administered 2016-05-29: 08:00:00 via INTRAVENOUS

## 2016-05-29 MED ORDER — VECURONIUM BROMIDE 10 MG IV SOLR
INTRAVENOUS | Status: AC
  Filled 2016-05-29: qty 10

## 2016-05-29 MED ORDER — PHENYLEPHRINE 40 MCG/ML (10ML) SYRINGE FOR IV PUSH (FOR BLOOD PRESSURE SUPPORT)
PREFILLED_SYRINGE | INTRAVENOUS | Status: AC
  Filled 2016-05-29: qty 10

## 2016-05-29 MED ORDER — DEXAMETHASONE SODIUM PHOSPHATE 10 MG/ML IJ SOLN
INTRAMUSCULAR | Status: DC | PRN
Start: 2016-05-29 — End: 2016-05-29
  Administered 2016-05-29: 10 mg via INTRAVENOUS

## 2016-05-29 SURGICAL SUPPLY — 105 items
APL SKNCLS STERI-STRIP NONHPOA (GAUZE/BANDAGES/DRESSINGS) ×2
BANDAGE ACE 3X5.8 VEL STRL LF (GAUZE/BANDAGES/DRESSINGS) ×1 IMPLANT
BANDAGE ACE 4X5 VEL STRL LF (GAUZE/BANDAGES/DRESSINGS) IMPLANT
BANDAGE ACE 6X5 VEL STRL LF (GAUZE/BANDAGES/DRESSINGS) IMPLANT
BENZOIN TINCTURE PRP APPL 2/3 (GAUZE/BANDAGES/DRESSINGS) ×3 IMPLANT
BIT DRILL 2.2 SS TIBIAL (BIT) ×1 IMPLANT
BIT DRILL 3.2 (BIT) ×3
BIT DRILL 3.2XCALB NS DISP (BIT) IMPLANT
BIT DRILL CALIBRATED 2.7 (BIT) ×1 IMPLANT
BIT DRL 3.2XCALB NS DISP (BIT) ×2
BNDG CMPR 9X4 STRL LF SNTH (GAUZE/BANDAGES/DRESSINGS) ×2
BNDG COHESIVE 4X5 TAN STRL (GAUZE/BANDAGES/DRESSINGS) ×2 IMPLANT
BNDG ESMARK 4X9 LF (GAUZE/BANDAGES/DRESSINGS) ×1 IMPLANT
CANISTER SUCT 3000ML (MISCELLANEOUS) ×1 IMPLANT
CORDS BIPOLAR (ELECTRODE) ×1 IMPLANT
COVER SURGICAL LIGHT HANDLE (MISCELLANEOUS) ×3 IMPLANT
CUFF TOURN SGL LL 12 NO SLV (MISCELLANEOUS) ×1 IMPLANT
CUFF TOURNIQUET SINGLE 18IN (TOURNIQUET CUFF) IMPLANT
DRAIN PENROSE 1/2X12 LTX STRL (WOUND CARE) IMPLANT
DRAPE C-ARM 42X72 X-RAY (DRAPES) ×1 IMPLANT
DRAPE IMP U-DRAPE 54X76 (DRAPES) ×3 IMPLANT
DRAPE OEC MINIVIEW 54X84 (DRAPES) ×1 IMPLANT
DRAPE U-SHAPE 47X51 STRL (DRAPES) ×3 IMPLANT
DRSG AQUACEL AG ADV 3.5X 6 (GAUZE/BANDAGES/DRESSINGS) ×1 IMPLANT
DRSG AQUACEL AG ADV 3.5X10 (GAUZE/BANDAGES/DRESSINGS) ×1 IMPLANT
DRSG PAD ABDOMINAL 8X10 ST (GAUZE/BANDAGES/DRESSINGS) IMPLANT
DURAPREP 26ML APPLICATOR (WOUND CARE) ×4 IMPLANT
ELECT BLADE 4.0 EZ CLEAN MEGAD (MISCELLANEOUS) ×3
ELECT REM PT RETURN 9FT ADLT (ELECTROSURGICAL) ×3
ELECTRODE BLDE 4.0 EZ CLN MEGD (MISCELLANEOUS) IMPLANT
ELECTRODE REM PT RTRN 9FT ADLT (ELECTROSURGICAL) ×2 IMPLANT
FACESHIELD WRAPAROUND (MASK) ×3 IMPLANT
FACESHIELD WRAPAROUND OR TEAM (MASK) ×2 IMPLANT
GAUZE SPONGE 4X4 12PLY STRL (GAUZE/BANDAGES/DRESSINGS) IMPLANT
GAUZE XEROFORM 5X9 LF (GAUZE/BANDAGES/DRESSINGS) IMPLANT
GLOVE BIOGEL PI IND STRL 8 (GLOVE) ×2 IMPLANT
GLOVE BIOGEL PI INDICATOR 8 (GLOVE) ×1
GLOVE SURG ORTHO 8.0 STRL STRW (GLOVE) ×3 IMPLANT
GLOVE XGUARD RR 2 7.5 (GLOVE) IMPLANT
GLOVE XGUARD RR2 7.5 (GLOVE) ×3
GOWN STRL REUS W/ TWL LRG LVL3 (GOWN DISPOSABLE) ×4 IMPLANT
GOWN STRL REUS W/ TWL XL LVL3 (GOWN DISPOSABLE) ×2 IMPLANT
GOWN STRL REUS W/TWL LRG LVL3 (GOWN DISPOSABLE) ×6
GOWN STRL REUS W/TWL XL LVL3 (GOWN DISPOSABLE) ×3
K-WIRE 1.6 (WIRE) ×9
K-WIRE 2X5 SS THRDED S3 (WIRE) ×6
K-WIRE FX5X1.6XNS BN SS (WIRE) ×6
KIT BASIN OR (CUSTOM PROCEDURE TRAY) ×3 IMPLANT
KIT ROOM TURNOVER OR (KITS) ×3 IMPLANT
KWIRE 2X5 SS THRDED S3 (WIRE) IMPLANT
KWIRE FX5X1.6XNS BN SS (WIRE) IMPLANT
MANIFOLD NEPTUNE II (INSTRUMENTS) ×2 IMPLANT
NEEDLE 21X1 OR PACK (NEEDLE) IMPLANT
NS IRRIG 1000ML POUR BTL (IV SOLUTION) ×7 IMPLANT
PACK SHOULDER (CUSTOM PROCEDURE TRAY) ×3 IMPLANT
PACK UNIVERSAL I (CUSTOM PROCEDURE TRAY) ×2 IMPLANT
PAD ARMBOARD 7.5X6 YLW CONV (MISCELLANEOUS) ×6 IMPLANT
PAD CAST 3X4 CTTN HI CHSV (CAST SUPPLIES) IMPLANT
PAD CAST 4YDX4 CTTN HI CHSV (CAST SUPPLIES) IMPLANT
PADDING CAST COTTON 3X4 STRL (CAST SUPPLIES) ×3
PADDING CAST COTTON 4X4 STRL (CAST SUPPLIES)
PEG LOCKING 3.2MMX44 (Peg) ×1 IMPLANT
PEG LOCKING 3.2MMX46 (Peg) ×2 IMPLANT
PEG LOCKING 3.2X 28MM (Peg) ×1 IMPLANT
PEG LOCKING 3.2X32 (Peg) ×2 IMPLANT
PEG LOCKING 3.2X36 (Screw) ×1 IMPLANT
PEG LOCKING 3.2X38 (Screw) ×1 IMPLANT
PEG LOCKING SMOOTH 2.2X16 (Screw) ×2 IMPLANT
PEG LOCKING SMOOTH 2.2X18 (Peg) ×3 IMPLANT
PENCIL BUTTON HOLSTER BLD 10FT (ELECTRODE) IMPLANT
PLATE NARROW DVR LEFT (Plate) ×1 IMPLANT
PLATE PROX HUMERUS 4H LEFT LOW (Plate) ×1 IMPLANT
SCREW LOCK 12X2.7X 3 LD (Screw) IMPLANT
SCREW LOCK 18X2.7X 3 LD TPR (Screw) IMPLANT
SCREW LOCK 20X2.7X 3 LD TPR (Screw) IMPLANT
SCREW LOCKING 2.7X12MM (Screw) ×9 IMPLANT
SCREW LOCKING 2.7X13MM (Screw) ×1 IMPLANT
SCREW LOCKING 2.7X18 (Screw) ×3 IMPLANT
SCREW LOCKING 2.7X20MM (Screw) ×6 IMPLANT
SCREW LP NL 2.7X22MM (Screw) ×1 IMPLANT
SCREW LP NL T15 3.5X22 (Screw) ×2 IMPLANT
SCREW LP NL T15 3.5X24 (Screw) ×2 IMPLANT
SCREW NONLOCK 2.7X20MM (Screw) ×1 IMPLANT
SCREW PEG LOCK 3.2X30MM (Screw) ×1 IMPLANT
SLEEVE MEASURING 3.2 (BIT) ×1 IMPLANT
SLING ARM IMMOBILIZER LRG (SOFTGOODS) ×1 IMPLANT
SPONGE LAP 18X18 X RAY DECT (DISPOSABLE) ×1 IMPLANT
SPONGE LAP 4X18 X RAY DECT (DISPOSABLE) ×5 IMPLANT
STAPLER VISISTAT 35W (STAPLE) IMPLANT
STOCKINETTE IMPERVIOUS 9X36 MD (GAUZE/BANDAGES/DRESSINGS) IMPLANT
STRIP CLOSURE SKIN 1/2X4 (GAUZE/BANDAGES/DRESSINGS) ×1 IMPLANT
SUCTION FRAZIER HANDLE 10FR (MISCELLANEOUS)
SUCTION TUBE FRAZIER 10FR DISP (MISCELLANEOUS) IMPLANT
SUT ETHILON 3 0 PS 1 (SUTURE) ×3 IMPLANT
SUT MNCRL AB 3-0 PS2 18 (SUTURE) ×1 IMPLANT
SUT VIC AB 0 CT1 27 (SUTURE) ×6
SUT VIC AB 0 CT1 27XBRD ANBCTR (SUTURE) IMPLANT
SUT VIC AB 2-0 CT1 27 (SUTURE) ×6
SUT VIC AB 2-0 CT1 TAPERPNT 27 (SUTURE) IMPLANT
SUT VIC AB 2-0 CTB1 (SUTURE) ×2 IMPLANT
TOWEL OR 17X24 6PK STRL BLUE (TOWEL DISPOSABLE) ×3 IMPLANT
TOWEL OR 17X26 10 PK STRL BLUE (TOWEL DISPOSABLE) ×3 IMPLANT
TUBE CONNECTING 12X1/4 (SUCTIONS) ×1 IMPLANT
WATER STERILE IRR 1000ML POUR (IV SOLUTION) ×2 IMPLANT
YANKAUER SUCT BULB TIP NO VENT (SUCTIONS) ×1 IMPLANT

## 2016-05-29 NOTE — Progress Notes (Addendum)
CAll to Dr, Orene Desanctis, regarding lab results from 05/25/2016, new order furnished.  Dr. Orene Desanctis did not request a repeat on CBC or chemistry panel.

## 2016-05-29 NOTE — Brief Op Note (Signed)
05/29/2016  2:07 PM  PATIENT:  Melanie Bradley  66 y.o. female  PRE-OPERATIVE DIAGNOSIS:  LEFT SHOULDER AND WRIST FRACTURES  POST-OPERATIVE DIAGNOSIS:  LEFT SHOULDER AND WRIST FRACTURES  PROCEDURE:  Procedure(s): LEFT OPEN REDUCTION INTERNAL FIXATION (ORIF) PROXIMAL HUMERUS FRACTURE OPEN REDUCTION INTERNAL FIXATION (ORIF) LEFT WRIST FRACTURE  SURGEON:  Surgeon(s): Meredith Pel, MD  ASSISTANT:Carla Modena Slater  RNFA  ANESTHESIA:   general  EBL: 250 ml    Total I/O In: 1500 [I.V.:1000; IV Piggyback:500] Out: 985 [Urine:725; Blood:260]  BLOOD ADMINISTERED: none  DRAINS: none   LOCAL MEDICATIONS USED:  none  SPECIMEN:  No Specimen  COUNTS:  YES  TOURNIQUET:   Total Tourniquet Time Documented: Forearm (Left) - 57 minutes Total: Forearm (Left) - 57 minutes   DICTATION: .Other Dictation: Dictation Number [id number]  PLAN OF CARE: Discharge to home after PACU  PATIENT DISPOSITION:  PACU - hemodynamically stable

## 2016-05-29 NOTE — Transfer of Care (Signed)
Immediate Anesthesia Transfer of Care Note  Patient: Melanie Bradley  Procedure(s) Performed: Procedure(s): LEFT OPEN REDUCTION INTERNAL FIXATION (ORIF) PROXIMAL HUMERUS FRACTURE (Left) OPEN REDUCTION INTERNAL FIXATION (ORIF) LEFT WRIST FRACTURE (Left)  Patient Location: PACU  Anesthesia Type:GA combined with regional for post-op pain  Level of Consciousness: awake, alert , oriented and patient cooperative  Airway & Oxygen Therapy: Patient Spontanous Breathing and Patient connected to face mask oxygen  Post-op Assessment: Report given to RN and Post -op Vital signs reviewed and stable  Post vital signs: Reviewed and stable  Last Vitals:  Vitals:   05/29/16 0910 05/29/16 1415  BP: 128/87 (!) 121/91  Pulse:  98  Resp:  15  Temp:  36.5 C    Last Pain: There were no vitals filed for this visit.       Complications: No apparent anesthesia complications

## 2016-05-29 NOTE — Anesthesia Preprocedure Evaluation (Signed)
Anesthesia Evaluation  Patient identified by MRN, date of birth, ID band Patient awake    Reviewed: Allergy & Precautions, NPO status , Patient's Chart, lab work & pertinent test results  History of Anesthesia Complications (+) PROLONGED EMERGENCE  Airway Mallampati: II   Neck ROM: Full    Dental no notable dental hx. (+) Caps,    Pulmonary COPD, Current Smoker,    breath sounds clear to auscultation       Cardiovascular hypertension, + dysrhythmias  Rhythm:Regular Rate:Normal     Neuro/Psych    GI/Hepatic hiatal hernia, GERD  ,  Endo/Other  Hypothyroidism   Renal/GU      Musculoskeletal   Abdominal (+) - obese,   Peds  Hematology negative hematology ROS (+)   Anesthesia Other Findings   Reproductive/Obstetrics                             Anesthesia Physical Anesthesia Plan  ASA: III  Anesthesia Plan: General   Post-op Pain Management:  Regional for Post-op pain   Induction:   Airway Management Planned: Oral ETT  Additional Equipment:   Intra-op Plan:   Post-operative Plan: Extubation in OR  Informed Consent: I have reviewed the patients History and Physical, chart, labs and discussed the procedure including the risks, benefits and alternatives for the proposed anesthesia with the patient or authorized representative who has indicated his/her understanding and acceptance.     Plan Discussed with:   Anesthesia Plan Comments:         Anesthesia Quick Evaluation

## 2016-05-29 NOTE — Anesthesia Procedure Notes (Addendum)
Anesthesia Regional Block: Interscalene brachial plexus block   Pre-Anesthetic Checklist: ,, timeout performed, Correct Patient, Correct Site, Correct Laterality, Correct Procedure, Correct Position, site marked, Risks and benefits discussed, at surgeon's request and post-op pain management  Laterality: Upper and Left  Prep: Betadine, chloraprep, alcohol swabs       Needles:  Injection technique: Single-shot  Needle Type: Echogenic Stimulator Needle     Needle Length: 4cm  Needle Gauge: 22   Needle insertion depth: 3 cm   Additional Needles:   Procedures: ultrasound guided, nerve stimulator,,,,,,   Nerve Stimulator or Paresthesia:  Response: Twitch elicited, 0.5 mA, 0.3 ms,   Additional Responses:   Narrative:  Start time: 05/29/2016 9:00 AM End time: 05/29/2016 9:12 AM Injection made incrementally with aspirations every 5 mL.  Performed by: Personally  Anesthesiologist: Dejanique Ruehl  Additional Notes: Block assessed prior to start of surgery

## 2016-05-29 NOTE — Anesthesia Procedure Notes (Addendum)
Procedure Name: Intubation Date/Time: 05/29/2016 10:18 AM Performed by: Everlean Cherry A Pre-anesthesia Checklist: Patient identified, Emergency Drugs available, Suction available and Patient being monitored Patient Re-evaluated:Patient Re-evaluated prior to inductionOxygen Delivery Method: Circle system utilized Preoxygenation: Pre-oxygenation with 100% oxygen Intubation Type: IV induction Ventilation: Mask ventilation without difficulty and Oral airway inserted - appropriate to patient size Laryngoscope Size: Sabra Heck and 2 Grade View: Grade I Tube type: Oral Tube size: 7.0 mm Number of attempts: 1 Airway Equipment and Method: Stylet Placement Confirmation: ETT inserted through vocal cords under direct vision,  positive ETCO2 and breath sounds checked- equal and bilateral Secured at: 23 cm Tube secured with: Tape Dental Injury: Teeth and Oropharynx as per pre-operative assessment

## 2016-05-29 NOTE — H&P (Signed)
Melanie Bradley is an 66 y.o. female.   Chief Complaint: Left wrist and shoulder pain HPI: Series a 66 year old female who fell backwards about 2 weeks ago landing on her left arm and wrist.  She presents with continued pain.  Fractures are demonstrated on radiographs from the emergency room.  She denies any other orthopedic complaints and specifically denies loss of consciousness.  She was scheduled last week for surgery however she was noted to have metabolic abnormalities which have now been corrected.  She is generally feeling well.  Past Medical History:  Diagnosis Date  . Atrial fibrillation ([place])   . Bloating   . Complication of anesthesia     " hard to wake up sometimes "  . Dementia   . Diverticula of colon   . Epigastric burning sensation   . GERD (gastroesophageal reflux disease)   . Headache disorder 06/03/2014  . Headache(784.0)   . Helicobacter pylori gastritis 2004  . Hemorrhoids 06/26/03   Dr. Laural Golden tcs  . Hiatal hernia 06/26/03   Dr. Laural Golden egd  . Hypertension   . Hypothyroidism   . 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 Memory difficulty 06/03/2014  . Shoulder fracture, left   . Spastic colon   . Wrist fracture     Past Surgical History:  Procedure Laterality Date  . APPENDECTOMY    . CHOLECYSTECTOMY    . COLONOSCOPY  06/26/03   Dr. Kem Boroughs diverticula at the sigmoid colon with one of the transverse colon, normal terminal ileoscopy, small external hemorrhoids the  . COLONOSCOPY N/A 01/20/2016   Procedure: COLONOSCOPY;  Surgeon: Daneil Dolin, MD;  [facility];  Service: Endoscopy;  Laterality: N/A;  2:30 pm  . ESOPHAGOGASTRODUODENOSCOPY  03/15/2011   Dr. Trevor Iha hiatal hernia, abnormal gastric mucosa of unclear significance. Gastric biopsies negative, no H. pylori.Procedure: ESOPHAGOGASTRODUODENOSCOPY (EGD);  Surgeon: Daneil Dolin, MD;  [facility];  Service: Endoscopy;  Laterality: N/A;  7:30  . TUBAL  LIGATION    . UPPER GASTROINTESTINAL ENDOSCOPY  06/26/03   Dr. Wynelle Bourgeois sliding hiatal hernia with 8 mm tongue of gastric type mucosa at distal esophagus bile in the stomach.    Family History  Problem Relation Age of Onset  . Hypertension Mother   . Angina Mother   . COPD Father   . Heart failure Father   . Cancer Sister   . Dementia Sister   . Cardiomyopathy Brother   . Pneumonia Other   . Migraines Sister   . Colon cancer Neg Hx    Social History:  reports that she has been smoking Cigarettes.  She started smoking about 49 years ago. She has a 67.50 pack-year smoking history. She has never used smokeless tobacco. She reports that she does not drink alcohol or use drugs.  Allergies:  Allergies  Allergen Reactions  . Penicillins Hives and Swelling    Has patient had a PCN reaction causing IMMEDIATE RASH, FACIAL/TONGUE/THROAT SWELLING, SOB, OR LIGHTHEADEDNESS WITH HYPOTENSION:  #  #  #  YES  #  #  #  Has patient had a PCN reaction causing severe rash involving mucus membranes or skin necrosis: No Has patient had a PCN reaction that required hospitalization No Has patient had a PCN reaction occurring within the last 10 years: No If all of the above answers are "NO", then may proceed with Cephalosporin use.   . Benadryl [Diphenhydramine Hcl] Hives  . Msm [Methylsulfonylmethane] Hives  .  Nubain [Nalbuphine Hcl] Hives  . Sulfa Antibiotics Hives  . Codeine     UNSPECIFIED REACTION [IF AT ALL] REFUSES TO TAKE    Medications Prior to Admission  Medication Sig Dispense Refill  . aspirin 81 MG tablet Take 81 mg by mouth daily.    . Cyanocobalamin (B-12 PO) Take 6,000 mcg by mouth daily.    Marland Kitchen donepezil (ARICEPT) 10 MG tablet Take 1 tablet (10 mg total) by mouth at bedtime. 30 tablet 11  . escitalopram (LEXAPRO) 10 MG tablet Take 10 mg by mouth every evening.   0  . HYDROcodone-acetaminophen (NORCO/VICODIN) 5-325 MG tablet Take 1 tablet by mouth every 4 (four) hours as needed.  (Patient taking differently: Take 1 tablet by mouth every 4 (four) hours as needed for moderate pain. ) 30 tablet 0  . levothyroxine (SYNTHROID, LEVOTHROID) 100 MCG tablet Take 100 mcg by mouth daily. **BRAND NAME ONLY** DOES NOT TAKE ON FRIDAY    . losartan-hydrochlorothiazide (HYZAAR) 100-12.5 MG tablet Take 1 tablet by mouth daily.   5  . memantine (NAMENDA) 10 MG tablet Take 1 tablet (10 mg total) by mouth 2 (two) times daily. 60 tablet 11  . OVER THE COUNTER MEDICATION Take 1 tablet by mouth daily. cognisense otc supplement    . potassium chloride SA (K-DUR,KLOR-CON) 20 MEQ tablet Take 1 tablet (20 mEq total) by mouth 2 (two) times daily. 30 tablet 0    No results found for this or any previous visit (from the past 48 hour(s)). No results found.  Review of Systems  Musculoskeletal: Positive for joint pain.  All other systems reviewed and are negative.   There were no vitals taken for this visit. Physical Exam  Constitutional: She appears well-developed.  HENT:  Head: Normocephalic.  Eyes: Pupils are equal, round, and reactive to light.  Neck: Normal range of motion.  Cardiovascular: Normal rate.   Respiratory: Effort normal.  Neurological: She is alert.  Psychiatric: She has a normal mood and affect.   Examination the left shoulder demonstrates bruising and swelling.  Elbow range of motion is intact.  Motor sensory function to the hand is intact.  Bruising and swelling is present around the wrist.  EPL FPL interosseous function is intact.  Patient has reasonable range of motion bilateral lower extremities including the hips knees and ankles.  Right upper extremity wrist elbow shoulder range of motion intact.  Assessment/Plan Impression is left proximal humerus fracture 2 part displaced along with significantly angulated and displaced left distal radius fracture.  Plan is open reduction internal fixation of both fractures.  Patient is a smoker so she has increased risk of The  complications of delayed union and malunion and nonunion.  Patient understands the risks and benefits of surgery including but not limited to infection or vessel damage nonunion malunion as well as potential for shoulder stiffness and wrist stiffness.  All questions answered.  Anderson Malta, MD 05/29/2016, 7:52 AM

## 2016-05-30 ENCOUNTER — Encounter (HOSPITAL_COMMUNITY): Payer: Self-pay | Admitting: Orthopedic Surgery

## 2016-05-30 NOTE — Op Note (Signed)
NAME:  QUANIYA, DAMAS                  ACCOUNT NO.:  MEDICAL RECORD NO.:  [id number]  LOCATION:                                 FACILITY:  PHYSICIAN:  Anderson Malta, M.D.         DATE OF BIRTH:  DATE OF PROCEDURE: DATE OF DISCHARGE:                              OPERATIVE REPORT   PREOPERATIVE DIAGNOSIS:  Left proximal humerus fracture and left distal radius fracture.  POSTOPERATIVE DIAGNOSIS:  Left proximal humerus fracture and left distal radius fracture.  PROCEDURE:  Open reduction and internal fixation, left proximal humerus fracture using Biomet locking plate and open reduction and internal fixation of distal radius fracture using Biomet hand plate.  SURGEON:  Anderson Malta, M.D.  ASSISTANT:  Laure Kidney, RNFA.  INDICATIONS:  Ladye is a 66 year old patient with left arm pain, who presents for operative management after explanation of risks and benefits.  She has proximal humerus and distal radius fracture, both on the left-hand side.  PROCEDURE IN DETAIL:  The patient was brought to the operating room where general anesthetic was induced.  Preoperative antibiotics administered.  Time-out was called.  Left arm, hand, and axilla prescrubbed with alcohol and Betadine, allowed to air dry, prepped with DuraPrep solution and draped in a sterile manner.  Charlie Pitter was used to cover the operative field.  Time-out was called.  Deltopectoral approach was made.  Skin and subcutaneous tissue were sharply divided.  Cephalic vein was mobilized laterally.  Fracture site was identified.  Some of the pec tendon was released as well as some of the anterior portion of the deltoid attachment insertion was also released.  The fracture fragments were irrigated.  Two ends of the fracture were then visualized.  The biceps tendon was used as a landmark to guide reduction.  Fracture then was reduced and reduction confirmed in the AP and lateral planes under fluoroscopy.  Plate was then applied  with good screw purchase obtained both in the shaft and the head.  Good reduction of the shaft and head was achieved.  At this time, thorough irrigation was performed.  Deltopectoral interval was then closed using 0 Vicryl suture, 2-0 Vicryl suture, and 3-0 Monocryl.  It should be noted that great care was taken to avoid injury to any of the neurovascular structures, particularly the axillary nerve underneath the head and neck region.  Following fixation of the proximal humerus fracture, attention was directed toward the distal radius.  Charlie Pitter was used to cover that operative field.  Tourniquet was inflated.  At this time, the arm was elevated, exsanguinated with the Esmarch wrap.  Tourniquet was inflated. Charlie Pitter also used to cover this operative field.  Incision was made over the FCR tendon.  It was mobilized radially along with the artery and vein.  Nerve was mobilized ulnarly.  Pronator quadratus was incised. Fracture was identified and using distal first technique, the distal radial articular surface was reduced to neutral.  Good fixation was achieved.  Pegs placed distally.  Screws placed proximally.  At this time, thorough irrigation was performed.  Volar height, tilt, and inclination were restored particularly to neutral and on the lateral. At this  time, tourniquet was released.  Bleeding points were encountered, controlled with bipolar electrocautery.  Skin was then closed using interrupted inverted 2-0 Vicryl suture, followed by 3-0 nylon sutures, followed by Aquacel dressing.  Aquacel also used for the shoulder.  Wrist splint then applied.  The patient tolerated the procedure well without immediate complication.  Shoulder immobilizer placed and transferred to the recovery room in stable condition.     Anderson Malta, M.D.     GSD/MEDQ  D:  05/29/2016  T:  05/30/2016  Job:  [id number]

## 2016-05-31 ENCOUNTER — Inpatient Hospital Stay (INDEPENDENT_AMBULATORY_CARE_PROVIDER_SITE_OTHER): Payer: BLUE CROSS/BLUE SHIELD | Admitting: Orthopedic Surgery

## 2016-06-02 NOTE — Anesthesia Postprocedure Evaluation (Addendum)
Anesthesia Post Note  Patient: Melanie Bradley  Procedure(s) Performed: Procedure(s) (LRB): LEFT OPEN REDUCTION INTERNAL FIXATION (ORIF) PROXIMAL HUMERUS FRACTURE (Left) OPEN REDUCTION INTERNAL FIXATION (ORIF) LEFT WRIST FRACTURE (Left)  Patient location during evaluation: PACU Anesthesia Type: General Level of consciousness: awake and alert Pain management: pain level controlled Vital Signs Assessment: post-procedure vital signs reviewed and stable Respiratory status: spontaneous breathing, nonlabored ventilation, respiratory function stable and patient connected to nasal cannula oxygen Cardiovascular status: blood pressure returned to baseline and stable Postop Assessment: no signs of nausea or vomiting Anesthetic complications: no       Last Vitals:  Vitals:   05/29/16 1445 05/29/16 1456  BP: (!) 125/94 132/90  Pulse: (!) 106 (!) 107  Resp: 18 14  Temp: 36.6 C     Last Pain:  Vitals:   05/29/16 1456  PainSc: 0-No pain                 Armenta Erskin,JAMES TERRILL

## 2016-06-05 ENCOUNTER — Ambulatory Visit (INDEPENDENT_AMBULATORY_CARE_PROVIDER_SITE_OTHER): Payer: Self-pay

## 2016-06-05 ENCOUNTER — Encounter (INDEPENDENT_AMBULATORY_CARE_PROVIDER_SITE_OTHER): Payer: Self-pay | Admitting: Orthopedic Surgery

## 2016-06-05 ENCOUNTER — Ambulatory Visit (INDEPENDENT_AMBULATORY_CARE_PROVIDER_SITE_OTHER): Payer: BLUE CROSS/BLUE SHIELD | Admitting: Orthopedic Surgery

## 2016-06-05 DIAGNOSIS — S42202D Unspecified fracture of upper end of left humerus, subsequent encounter for fracture with routine healing: Secondary | ICD-10-CM | POA: Diagnosis not present

## 2016-06-05 DIAGNOSIS — S62102D Fracture of unspecified carpal bone, left wrist, subsequent encounter for fracture with routine healing: Secondary | ICD-10-CM | POA: Diagnosis not present

## 2016-06-05 NOTE — Progress Notes (Signed)
Post-Op Visit Note   Patient: Melanie Bradley           Date of Birth: 02-May-1950           MRN: [id number] Visit Date: 06/05/2016 PCP: Purvis Kilts, MD   Assessment & Plan:  Chief Complaint:  Chief Complaint  Patient presents with  . Left Shoulder - Routine Post Op  . Left Wrist - Routine Post Op   Visit Diagnoses:  1. Closed fracture of proximal end of left humerus with routine healing, unspecified fracture morphology, subsequent encounter   2. Closed fracture of left wrist with routine healing, subsequent encounter     Plan:Melanie Bradley is a 66 year old patient with proximal humerus fracture on the left as well as left distal radius fracture.  She's been doing recently well.  Having a little bit of paresthesias on the dorsal aspect of the hand.  EPL is functional but her wrist extensors are weak.  This may just be related to pain.  Her deltoid does fire.  Incisions intact.  Radiographs looked reasonable.  Plan at this time is to start physical therapy in home for wrist range of motion and shoulder pendulums.  I'll see her back in a week for suture removal.  No radiographs needed at that time  Follow-Up Instructions: No Follow-up on file.   Orders:  Orders Placed This Encounter  Procedures  . XR Humerus Left  . XR Wrist Complete Left   No orders of the defined types were placed in this encounter.   Imaging: Xr Humerus Left  Result Date: 06/05/2016 2 views left proximal humerus shows plate fixation of proximal humerus fracture.  Some comminution is noted around the neck.  Hardware appears to be in good position.  Anteriorly one peg abuts the cortical humeral head surface.  No evidence of hardware loosening   PMFS History: Patient Active Problem List   Diagnosis Date Noted  . Encounter for screening colonoscopy 12/31/2015  . Memory difficulty 06/03/2014  . Headache disorder 06/03/2014  . Atrial flutter ([place]) 05/09/2013  . COPD (chronic obstructive pulmonary  disease) ([place]) 05/09/2013  . Tobacco abuse 05/08/2013  . LBBB (left bundle branch block)- intermittent 05/08/2013  . Chronic diarrhea 05/11/2011  . Chronic nausea 05/11/2011  . Dyspepsia 02/24/2011  . IBS (irritable bowel syndrome) 02/24/2011  . Chest pain 12/05/2010  . Hypertension 12/05/2010   Past Medical History:  Diagnosis Date  . Atrial fibrillation ([place])   . Bloating   . Complication of anesthesia     " hard to wake up sometimes "  . Dementia   . Diverticula of colon   . Epigastric burning sensation   . GERD (gastroesophageal reflux disease)   . Headache disorder 06/03/2014  . Headache(784.0)   . Helicobacter pylori gastritis 2004  . Hemorrhoids 06/26/03   Dr. Laural Golden tcs  . Hiatal hernia 06/26/03   Dr. Laural Golden egd  . Hypertension   . Hypothyroidism   . 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 Memory difficulty 06/03/2014  . Shoulder fracture, left   . Spastic colon   . Wrist fracture     Family History  Problem Relation Age of Onset  . Hypertension Mother   . Angina Mother   . COPD Father   . Heart failure Father   . Cancer Sister   . Dementia Sister   . Cardiomyopathy Brother   . Pneumonia Other   . Migraines Sister   . Colon cancer  Neg Hx     Past Surgical History:  Procedure Laterality Date  . APPENDECTOMY    . CHOLECYSTECTOMY    . COLONOSCOPY  06/26/03   Dr. Kem Boroughs diverticula at the sigmoid colon with one of the transverse colon, normal terminal ileoscopy, small external hemorrhoids the  . COLONOSCOPY N/A 01/20/2016   Procedure: COLONOSCOPY;  Surgeon: Daneil Dolin, MD;  [facility];  Service: Endoscopy;  Laterality: N/A;  2:30 pm  . ESOPHAGOGASTRODUODENOSCOPY  03/15/2011   Dr. Trevor Iha hiatal hernia, abnormal gastric mucosa of unclear significance. Gastric biopsies negative, no H. pylori.Procedure: ESOPHAGOGASTRODUODENOSCOPY (EGD);  Surgeon: Daneil Dolin, MD;  [facility];  Service: Endoscopy;   Laterality: N/A;  7:30  . ORIF HUMERUS FRACTURE Left 05/29/2016   Procedure: LEFT OPEN REDUCTION INTERNAL FIXATION (ORIF) PROXIMAL HUMERUS FRACTURE;  Surgeon: Meredith Pel, MD;  Location: [place];  Service: Orthopedics;  Laterality: Left;  . ORIF WRIST FRACTURE Left 05/29/2016   Procedure: OPEN REDUCTION INTERNAL FIXATION (ORIF) LEFT WRIST FRACTURE;  Surgeon: Meredith Pel, MD;  Location: [place];  Service: Orthopedics;  Laterality: Left;  . TUBAL LIGATION    . UPPER GASTROINTESTINAL ENDOSCOPY  06/26/03   Dr. Wynelle Bourgeois sliding hiatal hernia with 8 mm tongue of gastric type mucosa at distal esophagus bile in the stomach.   Social History   Occupational History  .     Social History Main Topics  . Smoking status: Current Every Day Smoker    Packs/day: 1.50    Years: 45.00    Types: Cigarettes    Start date: 09/10/1966  . Smokeless tobacco: Never Used  . Alcohol use No  . Drug use: No  . Sexual activity: Yes

## 2016-06-07 ENCOUNTER — Ambulatory Visit (HOSPITAL_COMMUNITY): Payer: BLUE CROSS/BLUE SHIELD | Attending: Orthopedic Surgery | Admitting: Occupational Therapy

## 2016-06-07 ENCOUNTER — Encounter (HOSPITAL_COMMUNITY): Payer: Self-pay | Admitting: Occupational Therapy

## 2016-06-07 DIAGNOSIS — M25512 Pain in left shoulder: Secondary | ICD-10-CM | POA: Insufficient documentation

## 2016-06-07 DIAGNOSIS — M25612 Stiffness of left shoulder, not elsewhere classified: Secondary | ICD-10-CM | POA: Diagnosis present

## 2016-06-07 DIAGNOSIS — M25632 Stiffness of left wrist, not elsewhere classified: Secondary | ICD-10-CM | POA: Diagnosis present

## 2016-06-07 DIAGNOSIS — R600 Localized edema: Secondary | ICD-10-CM | POA: Insufficient documentation

## 2016-06-07 DIAGNOSIS — M25622 Stiffness of left elbow, not elsewhere classified: Secondary | ICD-10-CM | POA: Diagnosis present

## 2016-06-07 DIAGNOSIS — R29898 Other symptoms and signs involving the musculoskeletal system: Secondary | ICD-10-CM | POA: Insufficient documentation

## 2016-06-07 DIAGNOSIS — M25532 Pain in left wrist: Secondary | ICD-10-CM | POA: Diagnosis present

## 2016-06-07 NOTE — Patient Instructions (Signed)
1) AROM: Finger Flexion / Extension   Actively bend fingers of right hand. Start with knuckles furthest from palm, and slowly make a fist.  Relax. Then straighten fingers as far as possible. Repeat _10___ times per set. Do __2-3__ sessions per day.     2) Abduction / Adduction (Active)    With hand flat on table, spread all fingers apart, then bring them together as close as possible. Repeat __10__ times. Do __2-3__ sessions per day.  Copyright  VHI. All rights reserved.     3) AROM: Thumb Abduction / Adduction   Actively pull right thumb away from palm as far as possible.  Then bring thumb back to touch fingers. Try not to bend fingers toward thumb. Repeat _10___ times per set.  Do _2-3___ sessions per day.  Copyright  VHI. All rights reserved.     4) Digit composite flexion/adduction (make a fist) Hold your hand up as shown. Open and close your hand into a fist and repeat. If you cannot make a full fist, then make a partial fist. Complete 10X, 2-3X/day     5) Thumb/finger opposition Touch the tip of the thumb to each fingertip one by one. Extend fingers fully after they are touched. Complete 10X, 2-3X/day       6) Elbow flexion and extension:   Bend and straighten left elbow. Use right hand to support and assist as needed. Complete 10X, 2-3X/day

## 2016-06-07 NOTE — Therapy (Addendum)
[place], [place], [address] Phone: [phone]   Fax:  [phone]  Occupational Therapy Evaluation  Patient Details  Name: Melanie Bradley MRN: [id number] Date of Birth: [birth year] Referring Provider: Dr. Meredith Pel  Encounter Date: 06/07/2016      OT End of Session - 06/07/16 1557    Visit Number 1   Number of Visits 30   Date for OT Re-Evaluation 08/06/16  mini-reassessment 07/05/2016   Authorization Type [employer]   Authorization Time Period 30 visit limit   Authorization - Visit Number 1   Authorization - Number of Visits 30   OT Start Time 9924   OT Stop Time 1546   OT Time Calculation (min) 71 min   Activity Tolerance Patient tolerated treatment well   Behavior During Therapy [facility] for tasks assessed/performed      Past Medical History:  Diagnosis Date  . Atrial fibrillation ([place])   . Bloating   . Complication of anesthesia     " hard to wake up sometimes "  . Dementia   . Diverticula of colon   . Epigastric burning sensation   . GERD (gastroesophageal reflux disease)   . Headache disorder 06/03/2014  . Headache(784.0)   . Helicobacter pylori gastritis 2004  . Hemorrhoids 06/26/03   Dr. Laural Golden tcs  . Hiatal hernia 06/26/03   Dr. Laural Golden egd  . Hypertension   . Hypothyroidism   . 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 Memory difficulty 06/03/2014  . Shoulder fracture, left   . Spastic colon   . Wrist fracture     Past Surgical History:  Procedure Laterality Date  . APPENDECTOMY    . CHOLECYSTECTOMY    . COLONOSCOPY  06/26/03   Dr. Kem Boroughs diverticula at the sigmoid colon with one of the transverse colon, normal terminal ileoscopy, small external hemorrhoids the  . COLONOSCOPY N/A 01/20/2016   Procedure: COLONOSCOPY;  Surgeon: Daneil Dolin, MD;  [facility];  Service: Endoscopy;  Laterality: N/A;  2:30 pm  . ESOPHAGOGASTRODUODENOSCOPY  03/15/2011    Dr. Trevor Iha hiatal hernia, abnormal gastric mucosa of unclear significance. Gastric biopsies negative, no H. pylori.Procedure: ESOPHAGOGASTRODUODENOSCOPY (EGD);  Surgeon: Daneil Dolin, MD;  [facility];  Service: Endoscopy;  Laterality: N/A;  7:30  . ORIF HUMERUS FRACTURE Left 05/29/2016   Procedure: LEFT OPEN REDUCTION INTERNAL FIXATION (ORIF) PROXIMAL HUMERUS FRACTURE;  Surgeon: Meredith Pel, MD;  Location: [place];  Service: Orthopedics;  Laterality: Left;  . ORIF WRIST FRACTURE Left 05/29/2016   Procedure: OPEN REDUCTION INTERNAL FIXATION (ORIF) LEFT WRIST FRACTURE;  Surgeon: Meredith Pel, MD;  Location: [place];  Service: Orthopedics;  Laterality: Left;  . TUBAL LIGATION    . UPPER GASTROINTESTINAL ENDOSCOPY  06/26/03   Dr. Wynelle Bourgeois sliding hiatal hernia with 8 mm tongue of gastric type mucosa at distal esophagus bile in the stomach.    There were no vitals filed for this visit.      Subjective Assessment - 06/07/16 1445    Subjective  S: I fell off the lawnmower.    Patient is accompained by: Family member  husband   Pertinent History Pt is a 66 y/o female s/p left proximal humerus and wrist fractures requiring ORIF. Pt fractured humerus and wrist after falling while getting on a lawnmower on 05/11/16, ORIF completed 05/29/16. Pt was referred to occupational therapy and treatment by Dr. Tonna Corner  Dean.    Patient Stated Goals To be able to use my left arm.    Currently in Pain? No/denies           [facility] OT Assessment - 06/07/16 1433      Assessment   Diagnosis s/p ORIF of left humerus fracture, ORIF left wrist   Referring Provider Dr. Meredith Pel   Onset Date 05/29/16  fall on 05/11/16   Prior Therapy None     Precautions   Precautions Shoulder;Other (comment)   Type of Shoulder Precautions Per MD: Begin with P/ROM and progress as tolerated   Shoulder Interventions Shoulder sling/immobilizer;At all times   Precaution Comments  left wrist brace   Required Braces or Orthoses Sling     Balance Screen   Has the patient fallen in the past 6 months Yes   How many times? 1   Has the patient had a decrease in activity level because of a fear of falling?  No   Is the patient reluctant to leave their home because of a fear of falling?  No     Home  Environment   Lives With Spouse     Prior Function   Level of Independence Independent with basic ADLs   Vocation Retired   Leisure yardwork, mowing, golf, gardening, flowers     ADL   ADL comments Pt is unable to use LUE for any daily tasks. Husband assisting with all ADL completion     Written Expression   Dominant Hand Right     Cognition   Overall Cognitive Status Within Functional Limits for tasks assessed     Edema   Edema Pt with significant edema along entire LUE, measurements to be taken next session.      ROM / Strength   AROM / PROM / Strength AROM;PROM;Strength     Palpation   Palpation comment Max fascial restrictions along left wrist, dorsal and volar forearm, elbow, upper arm, trapezius, and scapularis regions     AROM   Overall AROM  Unable to assess;Due to precautions;Due to pain     PROM   Overall PROM Comments shoulder assessed supine, er/IR adducted; wrist and elbow assessed seated.    PROM Assessment Site Shoulder;Wrist;Elbow;Forearm   Right/Left Shoulder Left   Left Shoulder Flexion 80 Degrees   Left Shoulder ABduction 59 Degrees   Left Shoulder Internal Rotation 90 Degrees   Left Shoulder External Rotation -8 Degrees   Right/Left Elbow Left   Left Elbow Flexion 127   Left Elbow Extension 38   Right/Left Forearm Left   Left Forearm Pronation 90 Degrees   Left Forearm Supination 0 Degrees   Right/Left Wrist Left   Left Wrist Extension 10 Degrees   Left Wrist Flexion 15 Degrees   Left Wrist Radial Deviation 5 Degrees   Left Wrist Ulnar Deviation 5 Degrees     Strength   Overall Strength Unable to assess;Due to precautions;Due  to pain                         OT Education - 06/07/16 1549    Education provided Yes   Education Details shoulder pendulums, elbow AA/ROM flexion and extension, hand/finger A/ROM exercises   Person(s) Educated Patient;Spouse   Methods Explanation;Demonstration;Handout   Comprehension Verbalized understanding;Returned demonstration;Verbal cues required;Tactile cues required  husband demonstrates understanding          OT Short Term Goals - 06/07/16 1605  OT SHORT TERM GOAL #1   Title Pt will be educated on HEP to improve LUE use during daily tasks.    Time 6   Period Weeks   Status New     OT SHORT TERM GOAL #2   Title Pt will decrease LUE pain to 5/10 to improve ability to assist with daily task completion.    Time 6   Period Weeks   Status New     OT SHORT TERM GOAL #3   Title Pt will decrease LUE fascial restrictions from max to moderate amounts to improve mobility required for functional reaching tasks.    Time 6   Period Weeks   Status New     OT SHORT TERM GOAL #4   Title Pt will improve left shoulder P/ROM to [facility] to improve ability to assist with donning shirts.    Time 6   Period Weeks   Status New     OT SHORT TERM GOAL #5   Title Pt will improve left elbow A/ROM to WNL to improve ability to use LUE as assist during meals.    Time 6   Period Weeks   Status New     Additional Short Term Goals   Additional Short Term Goals Yes     OT SHORT TERM GOAL #6   Title Pt will improve left wrist P/ROM to [facility] to improve ability to assist with grooming tasks.    Time 6   Period Weeks   Status New     OT SHORT TERM GOAL #7   Title Pt will improve LUE strength to 3-/5 to improve ability to use LUE as assist during bathing tasks.    Time 6   Period Weeks   Status New           OT Long Term Goals - 06/07/16 1610      OT LONG TERM GOAL #1   Title Pt will return to prior level of functioning and independence in ADL and leisure tasks  use LUE as assist/non-dominant arm.    Time 12   Period Weeks   Status New     OT LONG TERM GOAL #2   Title Pt will decrease LUE pain to 3/10 or less to improve ability to use LUE for functional task completion.    Time 12   Period Weeks   Status New     OT LONG TERM GOAL #3   Title Pt will decrease LUE fascial restrictions from mod to min amounts or less to improve mobility required for overhead reaching.    Time 12   Period Weeks   Status New     OT LONG TERM GOAL #4   Title Pt will improve left shoulder A/ROM to [facility] to improve ability to wash hair.    Time 12   Period Weeks   Status New     OT LONG TERM GOAL #5   Title Pt will improve left wrist A/ROM to [facility] to improve ability to work in flowers.    Time 12   Period Weeks   Status New     Long Term Additional Goals   Additional Long Term Goals Yes     OT LONG TERM GOAL #6   Title Pt will improve LUE strength to 4/5 to improve ability to perform yardwork tasks.    Time 12   Period Weeks   Status New  Plan - 06/07/16 1558    Clinical Impression Statement A: Pt is a 66 y/o female s/p ORIF of left humerus and wrist on 05/29/16, presenting significant limitations of functional use of LUE during daily tasks. Pt limited in P/ROM due to pain and significant edema. Pt provided with HEP for shoulder, elbow, and wrist. Pt husband present for evaluation, demonstrates understanding of HEP and able to assist with completion.    Rehab Potential Good   OT Frequency 3x / week  2x/week   OT Duration 6 weeks  for additional 6 weeks   OT Treatment/Interventions Self-care/ADL training;Therapeutic exercise;Patient/family education;Ultrasound;Manual Therapy;Splinting;Cryotherapy;Therapeutic activities;Electrical Stimulation;Moist Heat;Passive range of motion;Contrast Bath   Plan P: Pt will benefit from skilled OT services to decrease LUE pain and fascial restrictions, increase joint range of motion and strength to  improve LUE functional use during daily and leisure activities. Treatment plan: myofascial release, manual therapy, P/ROM, AA/ROM, A/ROM, general LUE strengthening, edema management, grip and pinch strengthening, fine motor coordination tasks, modalities as needed. Next session: measure edema   OT Home Exercise Plan 4/25: shoulder pendulums, elbow flexion/extension, finger/hand A/ROM   Consulted and Agree with Plan of Care Patient;Family member/caregiver   Family Member Consulted husband      Patient will benefit from skilled therapeutic intervention in order to improve the following deficits and impairments:  Decreased scar mobility, Decreased knowledge of precautions, Decreased activity tolerance, Decreased strength, Impaired flexibility, Decreased range of motion, Pain, Increased edema, Decreased coordination, Increased fascial restricitons, Impaired UE functional use  Visit Diagnosis: Pain in left wrist  Acute pain of left shoulder  Stiffness of left wrist, not elsewhere classified  Stiffness of left shoulder, not elsewhere classified  Other symptoms and signs involving the musculoskeletal system  Stiffness of left elbow, not elsewhere classified  Localized edema    Problem List Patient Active Problem List   Diagnosis Date Noted  . Encounter for screening colonoscopy 12/31/2015  . Memory difficulty 06/03/2014  . Headache disorder 06/03/2014  . Atrial flutter ([place]) 05/09/2013  . COPD (chronic obstructive pulmonary disease) ([place]) 05/09/2013  . Tobacco abuse 05/08/2013  . LBBB (left bundle branch block)- intermittent 05/08/2013  . Chronic diarrhea 05/11/2011  . Chronic nausea 05/11/2011  . Dyspepsia 02/24/2011  . IBS (irritable bowel syndrome) 02/24/2011  . Chest pain 12/05/2010  . Hypertension 12/05/2010   Guadelupe Sabin, OTR/L  [phone] 06/07/2016, 4:17 PM  [facility] [address] [place], [place], [address] Phone:  [phone]   Fax:  [phone]  Name: Melanie Bradley MRN: [id number] Date of Birth: 07-18-1950

## 2016-06-08 ENCOUNTER — Telehealth (INDEPENDENT_AMBULATORY_CARE_PROVIDER_SITE_OTHER): Payer: Self-pay | Admitting: Radiology

## 2016-06-08 NOTE — Telephone Encounter (Signed)
Please advise 

## 2016-06-08 NOTE — Telephone Encounter (Signed)
Okay to progress with passive range of motion as tolerated please call thanks

## 2016-06-08 NOTE — Telephone Encounter (Signed)
PT called and wants to know if you want them to do PROM for any length of time?  Or can they progress as tolerated?  Please call to advise.

## 2016-06-08 NOTE — Telephone Encounter (Signed)
IC to advise. No answer. LM for therapist on VM

## 2016-06-09 ENCOUNTER — Ambulatory Visit (HOSPITAL_COMMUNITY): Payer: BLUE CROSS/BLUE SHIELD | Admitting: Occupational Therapy

## 2016-06-09 ENCOUNTER — Encounter (HOSPITAL_COMMUNITY): Payer: Self-pay | Admitting: Occupational Therapy

## 2016-06-09 DIAGNOSIS — R600 Localized edema: Secondary | ICD-10-CM

## 2016-06-09 DIAGNOSIS — M25622 Stiffness of left elbow, not elsewhere classified: Secondary | ICD-10-CM

## 2016-06-09 DIAGNOSIS — M25512 Pain in left shoulder: Secondary | ICD-10-CM

## 2016-06-09 DIAGNOSIS — M25532 Pain in left wrist: Secondary | ICD-10-CM | POA: Diagnosis not present

## 2016-06-09 DIAGNOSIS — R29898 Other symptoms and signs involving the musculoskeletal system: Secondary | ICD-10-CM

## 2016-06-09 DIAGNOSIS — M25612 Stiffness of left shoulder, not elsewhere classified: Secondary | ICD-10-CM

## 2016-06-09 DIAGNOSIS — M25632 Stiffness of left wrist, not elsewhere classified: Secondary | ICD-10-CM

## 2016-06-09 NOTE — Therapy (Signed)
[place], [place], [address] Phone: [phone]   Fax:  [phone]  Occupational Therapy Treatment  Patient Details  Name: Melanie Bradley MRN: [id number] Date of Birth: Oct 13, 1950 Referring Provider: Dr. Meredith Pel  Encounter Date: 06/09/2016      OT End of Session - 06/09/16 1703    Visit Number 2   Number of Visits 30   Date for OT Re-Evaluation 08/06/16  mini-reassessment 07/05/2016   Authorization Type [employer]   Authorization Time Period 30 visit limit   Authorization - Visit Number 2   Authorization - Number of Visits 30   OT Start Time 1941   OT Stop Time 1638   OT Time Calculation (min) 83 min   Activity Tolerance Patient tolerated treatment well   Behavior During Therapy [facility] for tasks assessed/performed      Past Medical History:  Diagnosis Date  . Atrial fibrillation ([place])   . Bloating   . Complication of anesthesia     " hard to wake up sometimes "  . Dementia   . Diverticula of colon   . Epigastric burning sensation   . GERD (gastroesophageal reflux disease)   . Headache disorder 06/03/2014  . Headache(784.0)   . Helicobacter pylori gastritis 2004  . Hemorrhoids 06/26/03   Dr. Laural Golden tcs  . Hiatal hernia 06/26/03   Dr. Laural Golden egd  . Hypertension   . Hypothyroidism   . 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 Memory difficulty 06/03/2014  . Shoulder fracture, left   . Spastic colon   . Wrist fracture     Past Surgical History:  Procedure Laterality Date  . APPENDECTOMY    . CHOLECYSTECTOMY    . COLONOSCOPY  06/26/03   Dr. Kem Boroughs diverticula at the sigmoid colon with one of the transverse colon, normal terminal ileoscopy, small external hemorrhoids the  . COLONOSCOPY N/A 01/20/2016   Procedure: COLONOSCOPY;  Surgeon: Daneil Dolin, MD;  [facility];  Service: Endoscopy;  Laterality: N/A;  2:30 pm  . ESOPHAGOGASTRODUODENOSCOPY  03/15/2011    Dr. Trevor Iha hiatal hernia, abnormal gastric mucosa of unclear significance. Gastric biopsies negative, no H. pylori.Procedure: ESOPHAGOGASTRODUODENOSCOPY (EGD);  Surgeon: Daneil Dolin, MD;  [facility];  Service: Endoscopy;  Laterality: N/A;  7:30  . ORIF HUMERUS FRACTURE Left 05/29/2016   Procedure: LEFT OPEN REDUCTION INTERNAL FIXATION (ORIF) PROXIMAL HUMERUS FRACTURE;  Surgeon: Meredith Pel, MD;  Location: [facility];  Service: Orthopedics;  Laterality: Left;  . ORIF WRIST FRACTURE Left 05/29/2016   Procedure: OPEN REDUCTION INTERNAL FIXATION (ORIF) LEFT WRIST FRACTURE;  Surgeon: Meredith Pel, MD;  Location: [facility];  Service: Orthopedics;  Laterality: Left;  . TUBAL LIGATION    . UPPER GASTROINTESTINAL ENDOSCOPY  06/26/03   Dr. Wynelle Bourgeois sliding hiatal hernia with 8 mm tongue of gastric type mucosa at distal esophagus bile in the stomach.    There were no vitals filed for this visit.      Subjective Assessment - 06/09/16 1655    Subjective  S: I've been opening and closing my hand.    Patient is accompained by: Family member  husband-Rodney   Currently in Pain? No/denies  pt took medication prior to arrival            [facility] OT Assessment - 06/09/16 1653      Assessment   Diagnosis s/p ORIF of left humerus fracture, ORIF  left wrist   Referring Provider Dr. Meredith Pel     Precautions   Precautions Shoulder;Other (comment)   Type of Shoulder Precautions Per MD: Begin with P/ROM and progress as tolerated.    Shoulder Interventions Shoulder sling/immobilizer;At all times   Precaution Comments left wrist brace   Required Braces or Orthoses Sling     Edema   Edema Wrist crease- L: 17.5cm, R: 14.5cm; Elbow crease- L: 26cm, R: 22.5cm; Upper arm- L: 29cm, R: 24cm     Strength   Strength Assessment Site Hand   Right/Left hand Right;Left   Right Hand Grip (lbs) 45   Right Hand Lateral Pinch 11 lbs   Right Hand 3 Point Pinch 9 lbs   Left  Hand Gross Grasp Functional   Left Hand Grip (lbs) 8   Left Hand Lateral Pinch 3 lbs   Left Hand 3 Point Pinch 2 lbs                  OT Treatments/Exercises (OP) - 06/09/16 1656      Bed Mobility   Bed Mobility Supine to Sit;Sit to Supine   Supine to Sit 4: Min assist   Sit to Supine 4: Min guard     Exercises   Exercises Shoulder;Elbow;Wrist;Hand     Shoulder Exercises: Supine   Protraction PROM;10 reps   External Rotation PROM;10 reps   Internal Rotation PROM;10 reps   Flexion PROM;10 reps   Flexion Limitations scaption due to pain   ABduction PROM;10 reps     Shoulder Exercises: Seated   Elevation AROM;10 reps   Row AROM;10 reps   Other Seated Exercises towel slides-flexion and abduction, 10X, OT providing consistent verbal cuing for technique     Elbow Exercises   Elbow Extension PROM;AAROM;10 reps   Forearm Supination PROM;10 reps   Forearm Pronation PROM;10 reps   Wrist Flexion PROM;AAROM;10 reps   Wrist Extension PROM;AAROM;10 reps   Other elbow exercises elbow flexion, P/ROM and AA/ROM, 10X each     Wrist Exercises   Wrist Radial Deviation PROM;AAROM;10 reps   Wrist Ulnar Deviation PROM;AAROM;10 reps     Hand Exercises   Thumb Opposition 5X   Digit Abduction/Adduction 10X   Other Hand Exercises Towel crumple, 10X; Towel squeeze, 10X   Other Hand Exercises Composite digit flexion/extension     Manual Therapy   Manual Therapy Myofascial release;Edema management   Manual therapy comments Completed separately from therapeutic exercise   Edema Management Retrograde massage performed to left hand, proximal forearm, and upper arm regions to decrease edema and improve range of motion   Myofascial Release Myofascial release to left wrist, volar and dorsal forearm, elbow, upper arm, trapezius, and scapularis regions to decrease pain and fascial restrictions and increase joint range of motion.                   OT Short Term Goals - 06/09/16  1707      OT SHORT TERM GOAL #1   Title Pt will be educated on HEP to improve LUE use during daily tasks.    Time 6   Period Weeks   Status On-going     OT SHORT TERM GOAL #2   Title Pt will decrease LUE pain to 5/10 to improve ability to assist with daily task completion.    Time 6   Period Weeks   Status On-going     OT SHORT TERM GOAL #3   Title Pt will decrease LUE  fascial restrictions from max to moderate amounts to improve mobility required for functional reaching tasks.    Time 6   Period Weeks   Status On-going     OT SHORT TERM GOAL #4   Title Pt will improve left shoulder P/ROM to [facility] to improve ability to assist with donning shirts.    Time 6   Period Weeks   Status On-going     OT SHORT TERM GOAL #5   Title Pt will improve left elbow A/ROM to WNL to improve ability to use LUE as assist during meals.    Time 6   Period Weeks   Status On-going     Additional Short Term Goals   Additional Short Term Goals Yes     OT SHORT TERM GOAL #6   Title Pt will improve left wrist P/ROM to [facility] to improve ability to assist with grooming tasks.    Time 6   Period Weeks   Status On-going     OT SHORT TERM GOAL #7   Title Pt will improve LUE strength to 3-/5 to improve ability to use LUE as assist during bathing tasks.    Time 6   Period Weeks   Status On-going     OT SHORT TERM GOAL #8   Title Pt will increase LUE strength by 10# to improve ability to grasp a cup with LUE.    Time 6   Period Weeks   Status New     OT SHORT TERM GOAL  #9   TITLE Pt will improve left pinch strength by 2# to improve ability to grasp utensils/tools with LUE.    Time 6   Period Weeks   Status New           OT Long Term Goals - 06/09/16 1710      OT LONG TERM GOAL #1   Title Pt will return to prior level of functioning and independence in ADL and leisure tasks use LUE as assist/non-dominant arm.    Time 12   Period Weeks   Status On-going     OT LONG TERM GOAL #2   Title  Pt will decrease LUE pain to 3/10 or less to improve ability to use LUE for functional task completion.    Time 12   Period Weeks   Status On-going     OT LONG TERM GOAL #3   Title Pt will decrease LUE fascial restrictions from mod to min amounts or less to improve mobility required for overhead reaching.    Time 12   Period Weeks   Status On-going     OT LONG TERM GOAL #4   Title Pt will improve left shoulder A/ROM to [facility] to improve ability to wash hair.    Time 12   Period Weeks   Status On-going     OT LONG TERM GOAL #5   Title Pt will improve left wrist A/ROM to [facility] to improve ability to work in flowers.    Time 12   Period Weeks   Status On-going     Long Term Additional Goals   Additional Long Term Goals Yes     OT LONG TERM GOAL #6   Title Pt will improve LUE strength to 4/5 to improve ability to perform yardwork tasks.    Time 12   Period Weeks   Status On-going     OT LONG TERM GOAL #7   Title Pt will improve left grip strength by 15#  to improve ability to grasp plates with left hand.    Time 12   Period Weeks   Status New     OT LONG TERM GOAL #8   Title Pt will improve left pinch strength by 5# to improve ability to grasp cooking supplies with left hand.    Time 12   Period Weeks   Status New               Plan - 06/09/16 1703    Clinical Impression Statement A: Initiated edema management, myofascial release, P/ROM to wrist/elbow/shoulder, AA/ROM to wrist and shoulder this session. Pt with great response to manual techniques with improvements in P/ROM. Pt able to oppose thumb to all digits today and able to make a functional fist. Husband present for treatment session, reports pt has been completion HEP daily. Educated pt and husband on taking sling off when pt is sitting in chair with arm propped on pillows to encourage relaxation of trapezius and internal/external rotators. Grip and pinch tested this session, goals make accordingly.    Plan P:  continue with protocol, manual therapy to improve mobility. Update HEP for towel slides when appropriate.    OT Home Exercise Plan 4/25: shoulder pendulums, elbow flexion/extension, finger/hand A/ROM   Consulted and Agree with Plan of Care Patient;Family member/caregiver   Family Member Consulted husband-Rodney      Patient will benefit from skilled therapeutic intervention in order to improve the following deficits and impairments:  Decreased scar mobility, Decreased knowledge of precautions, Decreased activity tolerance, Decreased strength, Impaired flexibility, Decreased range of motion, Pain, Increased edema, Decreased coordination, Increased fascial restricitons, Impaired UE functional use  Visit Diagnosis: Pain in left wrist  Acute pain of left shoulder  Stiffness of left wrist, not elsewhere classified  Stiffness of left shoulder, not elsewhere classified  Other symptoms and signs involving the musculoskeletal system  Localized edema  Stiffness of left elbow, not elsewhere classified    Problem List Patient Active Problem List   Diagnosis Date Noted  . Encounter for screening colonoscopy 12/31/2015  . Memory difficulty 06/03/2014  . Headache disorder 06/03/2014  . Atrial flutter ([place]) 05/09/2013  . COPD (chronic obstructive pulmonary disease) ([place]) 05/09/2013  . Tobacco abuse 05/08/2013  . LBBB (left bundle branch block)- intermittent 05/08/2013  . Chronic diarrhea 05/11/2011  . Chronic nausea 05/11/2011  . Dyspepsia 02/24/2011  . IBS (irritable bowel syndrome) 02/24/2011  . Chest pain 12/05/2010  . Hypertension 12/05/2010   Guadelupe Sabin, OTR/L  [phone] 06/09/2016, 5:13 PM  [facility] [place] [address] [place], [place], [address] Phone: [phone]   Fax:  [phone]  Name: Melanie Bradley MRN: [id number] Date of Birth: [birth year]

## 2016-06-12 ENCOUNTER — Encounter (INDEPENDENT_AMBULATORY_CARE_PROVIDER_SITE_OTHER): Payer: Self-pay | Admitting: Orthopedic Surgery

## 2016-06-12 ENCOUNTER — Ambulatory Visit (INDEPENDENT_AMBULATORY_CARE_PROVIDER_SITE_OTHER): Payer: BLUE CROSS/BLUE SHIELD | Admitting: Orthopedic Surgery

## 2016-06-12 DIAGNOSIS — S42202D Unspecified fracture of upper end of left humerus, subsequent encounter for fracture with routine healing: Secondary | ICD-10-CM

## 2016-06-12 NOTE — Progress Notes (Signed)
Post-Op Visit Note   Patient: Melanie Bradley           Date of Birth: Oct 23, 1950           MRN: [id number] Visit Date: 06/12/2016 PCP: Purvis Kilts, MD   Assessment & Plan:  Chief Complaint:  Chief Complaint  Patient presents with  . Left Wrist - Follow-up, Fracture, Routine Post Op  . Left Shoulder - Follow-up, Fracture, Routine Post Op   Visit Diagnoses:  1. Closed fracture of proximal end of left humerus with routine healing, unspecified fracture morphology, subsequent encounter     Plan: [name] is a 66 year old patient who is now 2 weeks out ORIF left wrist and left proximal humerus.  Been going to physical therapy to 3 times a week and doing home exercises.  Sr. is here today and I showed her how to do some passive range of motion of the shoulder and wrist.  She's not taking any pain medicines.  On exam incisions intact on the wrist.  The sutures are removed.  Incision intact also on the shoulder.  She has improving grip strength.  I want to discontinue the sling and discontinue the wrist brace.  No lifting with the left arm.  Come back in 2 weeks repeat radiographs and reevaluate range of motion at that time.  Follow-Up Instructions: Return in about 2 weeks (around 06/26/2016).   Orders:  No orders of the defined types were placed in this encounter.  No orders of the defined types were placed in this encounter.   Imaging: No results found.  PMFS History: Patient Active Problem List   Diagnosis Date Noted  . Encounter for screening colonoscopy 12/31/2015  . Memory difficulty 06/03/2014  . Headache disorder 06/03/2014  . Atrial flutter ([place]) 05/09/2013  . COPD (chronic obstructive pulmonary disease) ([place]) 05/09/2013  . Tobacco abuse 05/08/2013  . LBBB (left bundle branch block)- intermittent 05/08/2013  . Chronic diarrhea 05/11/2011  . Chronic nausea 05/11/2011  . Dyspepsia 02/24/2011  . IBS (irritable bowel syndrome) 02/24/2011  . Chest pain 12/05/2010    . Hypertension 12/05/2010   Past Medical History:  Diagnosis Date  . Atrial fibrillation ([place])   . Bloating   . Complication of anesthesia     " hard to wake up sometimes "  . Dementia   . Diverticula of colon   . Epigastric burning sensation   . GERD (gastroesophageal reflux disease)   . Headache disorder 06/03/2014  . Headache(784.0)   . Helicobacter pylori gastritis 2004  . Hemorrhoids 06/26/03   Dr. Laural Golden tcs  . Hiatal hernia 06/26/03   Dr. Laural Golden egd  . Hypertension   . Hypothyroidism   . 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 Memory difficulty 06/03/2014  . Shoulder fracture, left   . Spastic colon   . Wrist fracture     Family History  Problem Relation Age of Onset  . Hypertension Mother   . Angina Mother   . COPD Father   . Heart failure Father   . Cancer Sister   . Dementia Sister   . Cardiomyopathy Brother   . Pneumonia Other   . Migraines Sister   . Colon cancer Neg Hx     Past Surgical History:  Procedure Laterality Date  . APPENDECTOMY    . CHOLECYSTECTOMY    . COLONOSCOPY  06/26/03   Dr. Kem Boroughs diverticula at the sigmoid colon with one of the transverse colon, normal  terminal ileoscopy, small external hemorrhoids the  . COLONOSCOPY N/A 01/20/2016   Procedure: COLONOSCOPY;  Surgeon: Daneil Dolin, MD;  [facility];  Service: Endoscopy;  Laterality: N/A;  2:30 pm  . ESOPHAGOGASTRODUODENOSCOPY  03/15/2011   Dr. Trevor Iha hiatal hernia, abnormal gastric mucosa of unclear significance. Gastric biopsies negative, no H. pylori.Procedure: ESOPHAGOGASTRODUODENOSCOPY (EGD);  Surgeon: Daneil Dolin, MD;  [facility];  Service: Endoscopy;  Laterality: N/A;  7:30  . ORIF HUMERUS FRACTURE Left 05/29/2016   Procedure: LEFT OPEN REDUCTION INTERNAL FIXATION (ORIF) PROXIMAL HUMERUS FRACTURE;  Surgeon: Meredith Pel, MD;  Location: [facility];  Service: Orthopedics;  Laterality: Left;  . ORIF WRIST FRACTURE Left  05/29/2016   Procedure: OPEN REDUCTION INTERNAL FIXATION (ORIF) LEFT WRIST FRACTURE;  Surgeon: Meredith Pel, MD;  Location: [facility];  Service: Orthopedics;  Laterality: Left;  . TUBAL LIGATION    . UPPER GASTROINTESTINAL ENDOSCOPY  06/26/03   Dr. Wynelle Bourgeois sliding hiatal hernia with 8 mm tongue of gastric type mucosa at distal esophagus bile in the stomach.   Social History   Occupational History  .     Social History Main Topics  . Smoking status: Current Every Day Smoker    Packs/day: 1.50    Years: 45.00    Types: Cigarettes    Start date: 09/10/1966  . Smokeless tobacco: Never Used  . Alcohol use No  . Drug use: No  . Sexual activity: Yes

## 2016-06-15 ENCOUNTER — Ambulatory Visit (HOSPITAL_COMMUNITY): Payer: BLUE CROSS/BLUE SHIELD | Attending: Orthopedic Surgery | Admitting: Occupational Therapy

## 2016-06-15 ENCOUNTER — Encounter (HOSPITAL_COMMUNITY): Payer: Self-pay | Admitting: Occupational Therapy

## 2016-06-15 DIAGNOSIS — R600 Localized edema: Secondary | ICD-10-CM | POA: Insufficient documentation

## 2016-06-15 DIAGNOSIS — R29898 Other symptoms and signs involving the musculoskeletal system: Secondary | ICD-10-CM | POA: Diagnosis present

## 2016-06-15 DIAGNOSIS — M25612 Stiffness of left shoulder, not elsewhere classified: Secondary | ICD-10-CM | POA: Diagnosis present

## 2016-06-15 DIAGNOSIS — M25622 Stiffness of left elbow, not elsewhere classified: Secondary | ICD-10-CM | POA: Insufficient documentation

## 2016-06-15 DIAGNOSIS — M25532 Pain in left wrist: Secondary | ICD-10-CM | POA: Diagnosis present

## 2016-06-15 DIAGNOSIS — M25512 Pain in left shoulder: Secondary | ICD-10-CM | POA: Insufficient documentation

## 2016-06-15 DIAGNOSIS — M25632 Stiffness of left wrist, not elsewhere classified: Secondary | ICD-10-CM

## 2016-06-15 NOTE — Therapy (Signed)
[facility] [address] [place], [place], [address] Phone: [phone]   Fax:  [phone]  Occupational Therapy Treatment  Patient Details  Name: Melanie Bradley MRN: [id number] Date of Birth: November 11, 1950 Referring Provider: Dr. Meredith Pel  Encounter Date: 06/15/2016      OT End of Session - 06/15/16 1121    Visit Number 3   Number of Visits 30   Date for OT Re-Evaluation 08/06/16  mini-reassessment 07/05/2016   Authorization Type BC[employer]   Authorization Time Period 30 visit limit   Authorization - Visit Number 3   Authorization - Number of Visits 30   OT Start Time [phone]   OT Stop Time 0900   OT Time Calculation (min) 41 min   Activity Tolerance Patient tolerated treatment well   Behavior During Therapy [facility] for tasks assessed/performed      Past Medical History:  Diagnosis Date  . Atrial fibrillation ([place])   . Bloating   . Complication of anesthesia     " hard to wake up sometimes "  . Dementia   . Diverticula of colon   . Epigastric burning sensation   . GERD (gastroesophageal reflux disease)   . Headache disorder 06/03/2014  . Headache(784.0)   . Helicobacter pylori gastritis 2004  . Hemorrhoids 06/26/03   Dr. Laural Golden tcs  . Hiatal hernia 06/26/03   Dr. Laural Golden egd  . Hypertension   . Hypothyroidism   . 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 Memory difficulty 06/03/2014  . Shoulder fracture, left   . Spastic colon   . Wrist fracture     Past Surgical History:  Procedure Laterality Date  . APPENDECTOMY    . CHOLECYSTECTOMY    . COLONOSCOPY  06/26/03   Dr. Kem Boroughs diverticula at the sigmoid colon with one of the transverse colon, normal terminal ileoscopy, small external hemorrhoids the  . COLONOSCOPY N/A 01/20/2016   Procedure: COLONOSCOPY;  Surgeon: Daneil Dolin, MD;  [facility];  Service: Endoscopy;  Laterality: N/A;  2:30 pm  . ESOPHAGOGASTRODUODENOSCOPY  03/15/2011    Dr. Trevor Iha hiatal hernia, abnormal gastric mucosa of unclear significance. Gastric biopsies negative, no H. pylori.Procedure: ESOPHAGOGASTRODUODENOSCOPY (EGD);  Surgeon: Daneil Dolin, MD;  [facility];  Service: Endoscopy;  Laterality: N/A;  7:30  . ORIF HUMERUS FRACTURE Left 05/29/2016   Procedure: LEFT OPEN REDUCTION INTERNAL FIXATION (ORIF) PROXIMAL HUMERUS FRACTURE;  Surgeon: Meredith Pel, MD;  Location: [place];  Service: Orthopedics;  Laterality: Left;  . ORIF WRIST FRACTURE Left 05/29/2016   Procedure: OPEN REDUCTION INTERNAL FIXATION (ORIF) LEFT WRIST FRACTURE;  Surgeon: Meredith Pel, MD;  Location: [place];  Service: Orthopedics;  Laterality: Left;  . TUBAL LIGATION    . UPPER GASTROINTESTINAL ENDOSCOPY  06/26/03   Dr. Wynelle Bourgeois sliding hiatal hernia with 8 mm tongue of gastric type mucosa at distal esophagus bile in the stomach.    There were no vitals filed for this visit.      Subjective Assessment - 06/15/16 1118    Subjective  S: The doctor said I don't have to wear my sling or my brace now.    Currently in Pain? No/denies  pain medication prior to arrival            [facility] OT Assessment - 06/15/16 1100      Assessment   Diagnosis s/p ORIF of left humerus fracture, ORIF left wrist  Referring Provider Dr. Meredith Pel     Precautions   Precautions Shoulder   Type of Shoulder Precautions Per MD: Begin with P/ROM and progress as tolerated.                   OT Treatments/Exercises (OP) - 06/15/16 1119      Exercises   Exercises Shoulder;Elbow;Wrist;Hand     Shoulder Exercises: Supine   Protraction PROM;10 reps   Horizontal ABduction PROM;10 reps   External Rotation PROM;10 reps   Internal Rotation PROM;10 reps   Flexion PROM;10 reps   ABduction PROM;10 reps     Shoulder Exercises: Seated   Elevation AROM;10 reps   Row AROM;10 reps     Shoulder Exercises: Therapy Ball   Flexion 10 reps   ABduction 10  reps;Limitations   ABduction Limitations scaption due to wrist pain     Elbow Exercises   Elbow Extension PROM;AAROM;10 reps   Forearm Supination PROM;Self ROM;10 reps   Forearm Pronation PROM;Self ROM;10 reps   Wrist Flexion PROM;Self ROM;10 reps   Wrist Extension PROM;Self ROM;10 reps   Other elbow exercises elbow flexion, P/ROM and self-ROM, 10X each     Wrist Exercises   Wrist Radial Deviation PROM;Self ROM;10 reps   Wrist Ulnar Deviation PROM;Self ROM;10 reps     Manual Therapy   Manual Therapy Myofascial release;Edema management   Manual therapy comments Completed separately from therapeutic exercise   Edema Management Retrograde massage performed to left hand, proximal forearm, and upper arm regions to decrease edema and improve range of motion   Myofascial Release Myofascial release to left wrist, volar and dorsal forearm, elbow, upper arm, trapezius, and scapularis regions to decrease pain and fascial restrictions and increase joint range of motion.                   OT Short Term Goals - 06/09/16 1707      OT SHORT TERM GOAL #1   Title Pt will be educated on HEP to improve LUE use during daily tasks.    Time 6   Period Weeks   Status On-going     OT SHORT TERM GOAL #2   Title Pt will decrease LUE pain to 5/10 to improve ability to assist with daily task completion.    Time 6   Period Weeks   Status On-going     OT SHORT TERM GOAL #3   Title Pt will decrease LUE fascial restrictions from max to moderate amounts to improve mobility required for functional reaching tasks.    Time 6   Period Weeks   Status On-going     OT SHORT TERM GOAL #4   Title Pt will improve left shoulder P/ROM to [facility] to improve ability to assist with donning shirts.    Time 6   Period Weeks   Status On-going     OT SHORT TERM GOAL #5   Title Pt will improve left elbow A/ROM to WNL to improve ability to use LUE as assist during meals.    Time 6   Period Weeks   Status  On-going     Additional Short Term Goals   Additional Short Term Goals Yes     OT SHORT TERM GOAL #6   Title Pt will improve left wrist P/ROM to [facility] to improve ability to assist with grooming tasks.    Time 6   Period Weeks   Status On-going     OT SHORT TERM GOAL #  7   Title Pt will improve LUE strength to 3-/5 to improve ability to use LUE as assist during bathing tasks.    Time 6   Period Weeks   Status On-going     OT SHORT TERM GOAL #8   Title Pt will increase LUE strength by 10# to improve ability to grasp a cup with LUE.    Time 6   Period Weeks   Status New     OT SHORT TERM GOAL  #9   TITLE Pt will improve left pinch strength by 2# to improve ability to grasp utensils/tools with LUE.    Time 6   Period Weeks   Status New           OT Long Term Goals - 06/09/16 1710      OT LONG TERM GOAL #1   Title Pt will return to prior level of functioning and independence in ADL and leisure tasks use LUE as assist/non-dominant arm.    Time 12   Period Weeks   Status On-going     OT LONG TERM GOAL #2   Title Pt will decrease LUE pain to 3/10 or less to improve ability to use LUE for functional task completion.    Time 12   Period Weeks   Status On-going     OT LONG TERM GOAL #3   Title Pt will decrease LUE fascial restrictions from mod to min amounts or less to improve mobility required for overhead reaching.    Time 12   Period Weeks   Status On-going     OT LONG TERM GOAL #4   Title Pt will improve left shoulder A/ROM to [facility] to improve ability to wash hair.    Time 12   Period Weeks   Status On-going     OT LONG TERM GOAL #5   Title Pt will improve left wrist A/ROM to [facility] to improve ability to work in flowers.    Time 12   Period Weeks   Status On-going     Long Term Additional Goals   Additional Long Term Goals Yes     OT LONG TERM GOAL #6   Title Pt will improve LUE strength to 4/5 to improve ability to perform yardwork tasks.    Time 12   Period  Weeks   Status On-going     OT LONG TERM GOAL #7   Title Pt will improve left grip strength by 15# to improve ability to grasp plates with left hand.    Time 12   Period Weeks   Status New     OT LONG TERM GOAL #8   Title Pt will improve left pinch strength by 5# to improve ability to grasp cooking supplies with left hand.    Time 12   Period Weeks   Status New               Plan - 06/15/16 1121    Clinical Impression Statement A: Pt reports MD has discontinued sling and wrist brace and provided pt with wrist AA/ROM exercises. Pt with notable decrease in edema along wrist, forearm, and upper arm regions this session, able to tolerate increased P/ROM. Added self-ROM exercises, therapy ball exercises this session. Consistent verbal cuing and initial tactile facilitation for correct form/technique.    Plan P: Continue with manual therapy for edema management and decreasing fascial restrictions; attempt AA/ROM supine; complete towel slides and update HEP   OT Home Exercise Plan 4/25:  shoulder pendulums, elbow flexion/extension, finger/hand A/ROM   Consulted and Agree with Plan of Care Patient;Family member/caregiver   Family Member Consulted husband-Rodney      Patient will benefit from skilled therapeutic intervention in order to improve the following deficits and impairments:  Decreased scar mobility, Decreased knowledge of precautions, Decreased activity tolerance, Decreased strength, Impaired flexibility, Decreased range of motion, Pain, Increased edema, Decreased coordination, Increased fascial restricitons, Impaired UE functional use  Visit Diagnosis: Pain in left wrist  Acute pain of left shoulder  Stiffness of left wrist, not elsewhere classified  Stiffness of left shoulder, not elsewhere classified  Other symptoms and signs involving the musculoskeletal system  Localized edema  Stiffness of left elbow, not elsewhere classified    Problem List Patient Active  Problem List   Diagnosis Date Noted  . Encounter for screening colonoscopy 12/31/2015  . Memory difficulty 06/03/2014  . Headache disorder 06/03/2014  . Atrial flutter ([place]) 05/09/2013  . COPD (chronic obstructive pulmonary disease) ([place]) 05/09/2013  . Tobacco abuse 05/08/2013  . LBBB (left bundle branch block)- intermittent 05/08/2013  . Chronic diarrhea 05/11/2011  . Chronic nausea 05/11/2011  . Dyspepsia 02/24/2011  . IBS (irritable bowel syndrome) 02/24/2011  . Chest pain 12/05/2010  . Hypertension 12/05/2010   Guadelupe Sabin, OTR/L  [phone] 06/15/2016, 11:56 AM  [facility] [address] [place], [place], [address] Phone: [phone]   Fax:  [phone]  Name: Melanie Bradley MRN: [id number] Date of Birth: 1950-05-19

## 2016-06-16 ENCOUNTER — Ambulatory Visit (HOSPITAL_COMMUNITY): Payer: BLUE CROSS/BLUE SHIELD | Admitting: Occupational Therapy

## 2016-06-16 DIAGNOSIS — M25532 Pain in left wrist: Secondary | ICD-10-CM

## 2016-06-16 DIAGNOSIS — M25512 Pain in left shoulder: Secondary | ICD-10-CM

## 2016-06-16 DIAGNOSIS — M25622 Stiffness of left elbow, not elsewhere classified: Secondary | ICD-10-CM

## 2016-06-16 DIAGNOSIS — M25632 Stiffness of left wrist, not elsewhere classified: Secondary | ICD-10-CM

## 2016-06-16 DIAGNOSIS — M25612 Stiffness of left shoulder, not elsewhere classified: Secondary | ICD-10-CM

## 2016-06-16 DIAGNOSIS — R29898 Other symptoms and signs involving the musculoskeletal system: Secondary | ICD-10-CM

## 2016-06-16 DIAGNOSIS — R600 Localized edema: Secondary | ICD-10-CM

## 2016-06-16 NOTE — Therapy (Signed)
[facility] [place], [place], [address] Phone: [phone]   Fax:  [phone]  Occupational Therapy Treatment  Patient Details  Name: Melanie Bradley MRN: [id number] Date of Birth: [birth year] Referring Provider: Dr. Meredith Pel  Encounter Date: 06/16/2016      OT End of Session - 06/16/16 1536    Visit Number 4   Number of Visits 30   Date for OT Re-Evaluation 08/06/16  mini-reassessment 07/05/2016   Authorization Type BC[employer]   Authorization Time Period 30 visit limit   Authorization - Visit Number 4   Authorization - Number of Visits 30   OT Start Time 0258   OT Stop Time 1515   OT Time Calculation (min) 87 min   Activity Tolerance Patient tolerated treatment well   Behavior During Therapy [facility] for tasks assessed/performed      Past Medical History:  Diagnosis Date  . Atrial fibrillation ([place])   . Bloating   . Complication of anesthesia     " hard to wake up sometimes "  . Dementia   . Diverticula of colon   . Epigastric burning sensation   . GERD (gastroesophageal reflux disease)   . Headache disorder 06/03/2014  . Headache(784.0)   . Helicobacter pylori gastritis 2004  . Hemorrhoids 06/26/03   Dr. Laural Golden tcs  . Hiatal hernia 06/26/03   Dr. Laural Golden egd  . Hypertension   . Hypothyroidism   . 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 Memory difficulty 06/03/2014  . Shoulder fracture, left   . Spastic colon   . Wrist fracture     Past Surgical History:  Procedure Laterality Date  . APPENDECTOMY    . CHOLECYSTECTOMY    . COLONOSCOPY  06/26/03   Dr. Kem Boroughs diverticula at the sigmoid colon with one of the transverse colon, normal terminal ileoscopy, small external hemorrhoids the  . COLONOSCOPY N/A 01/20/2016   Procedure: COLONOSCOPY;  Surgeon: Daneil Dolin, MD;  [facility];  Service: Endoscopy;  Laterality: N/A;  2:30 pm  . ESOPHAGOGASTRODUODENOSCOPY  03/15/2011    Dr. Trevor Iha hiatal hernia, abnormal gastric mucosa of unclear significance. Gastric biopsies negative, no H. pylori.Procedure: ESOPHAGOGASTRODUODENOSCOPY (EGD);  Surgeon: Daneil Dolin, MD;  [facility];  Service: Endoscopy;  Laterality: N/A;  7:30  . ORIF HUMERUS FRACTURE Left 05/29/2016   Procedure: LEFT OPEN REDUCTION INTERNAL FIXATION (ORIF) PROXIMAL HUMERUS FRACTURE;  Surgeon: Meredith Pel, MD;  Location: [place];  Service: Orthopedics;  Laterality: Left;  . ORIF WRIST FRACTURE Left 05/29/2016   Procedure: OPEN REDUCTION INTERNAL FIXATION (ORIF) LEFT WRIST FRACTURE;  Surgeon: Meredith Pel, MD;  Location: [place];  Service: Orthopedics;  Laterality: Left;  . TUBAL LIGATION    . UPPER GASTROINTESTINAL ENDOSCOPY  06/26/03   Dr. Wynelle Bourgeois sliding hiatal hernia with 8 mm tongue of gastric type mucosa at distal esophagus bile in the stomach.    There were no vitals filed for this visit.      Subjective Assessment - 06/16/16 1439    Subjective  S: I'm trying to remember to relax my arm.    Currently in Pain? No/denies            [facility] OT Assessment - 06/15/16 1100      Assessment   Diagnosis s/p ORIF of left humerus fracture, ORIF left wrist   Referring Provider Dr. Meredith Pel     Precautions  Precautions Shoulder   Type of Shoulder Precautions Per MD: Begin with P/ROM and progress as tolerated.                   OT Treatments/Exercises (OP) - 06/16/16 1440      Exercises   Exercises Shoulder;Elbow;Wrist;Hand     Shoulder Exercises: Supine   Protraction PROM;AAROM;10 reps   Horizontal ABduction PROM;AAROM;10 reps   External Rotation PROM;AAROM;10 reps   Internal Rotation PROM;AAROM;10 reps   Flexion PROM;AAROM;10 reps   ABduction PROM;AAROM;10 reps     Shoulder Exercises: Seated   Elevation AROM;10 reps   Extension AROM;10 reps   Row AROM;10 reps     Elbow Exercises   Elbow Extension PROM;AROM;10 reps   Forearm  Supination PROM;AROM;10 reps   Forearm Pronation PROM;AROM;10 reps   Wrist Flexion PROM;Self ROM;10 reps   Wrist Extension PROM;Self ROM;10 reps   Other elbow exercises elbow flexion, P/ROM and A/ROM, 10X each     Wrist Exercises   Wrist Radial Deviation PROM;Self ROM;10 reps   Wrist Ulnar Deviation PROM;Self ROM;10 reps     Additional Wrist Exercises   Sponges 8, 11   Theraputty Flatten;Roll;Grip;Pinch  yellow   Theraputty - Pinch yellow- lateral and 3 point     Hand Exercises   Other Hand Exercises Composite digit flexion/extension, 10X     Manual Therapy   Manual Therapy Myofascial release;Edema management   Manual therapy comments Completed separately from therapeutic exercise   Edema Management Retrograde massage performed to left hand, proximal forearm, and upper arm regions to decrease edema and improve range of motion   Myofascial Release Myofascial release to left wrist, volar and dorsal forearm, elbow, upper arm, trapezius, and scapularis regions to decrease pain and fascial restrictions and increase joint range of motion. Scar massage to incisions to improve scar mobility and reduce fascial restrictions.                   OT Short Term Goals - 06/09/16 1707      OT SHORT TERM GOAL #1   Title Pt will be educated on HEP to improve LUE use during daily tasks.    Time 6   Period Weeks   Status On-going     OT SHORT TERM GOAL #2   Title Pt will decrease LUE pain to 5/10 to improve ability to assist with daily task completion.    Time 6   Period Weeks   Status On-going     OT SHORT TERM GOAL #3   Title Pt will decrease LUE fascial restrictions from max to moderate amounts to improve mobility required for functional reaching tasks.    Time 6   Period Weeks   Status On-going     OT SHORT TERM GOAL #4   Title Pt will improve left shoulder P/ROM to [facility] to improve ability to assist with donning shirts.    Time 6   Period Weeks   Status On-going     OT  SHORT TERM GOAL #5   Title Pt will improve left elbow A/ROM to WNL to improve ability to use LUE as assist during meals.    Time 6   Period Weeks   Status On-going     Additional Short Term Goals   Additional Short Term Goals Yes     OT SHORT TERM GOAL #6   Title Pt will improve left wrist P/ROM to [facility] to improve ability to assist with grooming tasks.  Time 6   Period Weeks   Status On-going     OT SHORT TERM GOAL #7   Title Pt will improve LUE strength to 3-/5 to improve ability to use LUE as assist during bathing tasks.    Time 6   Period Weeks   Status On-going     OT SHORT TERM GOAL #8   Title Pt will increase LUE strength by 10# to improve ability to grasp a cup with LUE.    Time 6   Period Weeks   Status New     OT SHORT TERM GOAL  #9   TITLE Pt will improve left pinch strength by 2# to improve ability to grasp utensils/tools with LUE.    Time 6   Period Weeks   Status New           OT Long Term Goals - 06/09/16 1710      OT LONG TERM GOAL #1   Title Pt will return to prior level of functioning and independence in ADL and leisure tasks use LUE as assist/non-dominant arm.    Time 12   Period Weeks   Status On-going     OT LONG TERM GOAL #2   Title Pt will decrease LUE pain to 3/10 or less to improve ability to use LUE for functional task completion.    Time 12   Period Weeks   Status On-going     OT LONG TERM GOAL #3   Title Pt will decrease LUE fascial restrictions from mod to min amounts or less to improve mobility required for overhead reaching.    Time 12   Period Weeks   Status On-going     OT LONG TERM GOAL #4   Title Pt will improve left shoulder A/ROM to [facility] to improve ability to wash hair.    Time 12   Period Weeks   Status On-going     OT LONG TERM GOAL #5   Title Pt will improve left wrist A/ROM to [facility] to improve ability to work in flowers.    Time 12   Period Weeks   Status On-going     Long Term Additional Goals   Additional  Long Term Goals Yes     OT LONG TERM GOAL #6   Title Pt will improve LUE strength to 4/5 to improve ability to perform yardwork tasks.    Time 12   Period Weeks   Status On-going     OT LONG TERM GOAL #7   Title Pt will improve left grip strength by 15# to improve ability to grasp plates with left hand.    Time 12   Period Weeks   Status New     OT LONG TERM GOAL #8   Title Pt will improve left pinch strength by 5# to improve ability to grasp cooking supplies with left hand.    Time 12   Period Weeks   Status New               Plan - 06/16/16 1533    Clinical Impression Statement A: Continued with manual therapy, pt making improvements in P/ROM in shoulder, elbow, and wrist. Scar massage initiated to reduce fascial restrictions and improve scar mobility. Initiated shoulder AA/ROM with good form, pt able to tolerate approximately 50% range. Pt requires verbal and tactile facilitation for correct form and technique.    Plan P: continue with manual therapy working to reduce edema and fascial restrictions for improved mobility.  Continue with AA/ROM in supine, add thumb tacks and wall wash.    OT Home Exercise Plan 4/25: shoulder pendulums, elbow flexion/extension, finger/hand A/ROM   Consulted and Agree with Plan of Care Patient   Family Member Consulted husband-Rodney      Patient will benefit from skilled therapeutic intervention in order to improve the following deficits and impairments:  Decreased scar mobility, Decreased knowledge of precautions, Decreased activity tolerance, Decreased strength, Impaired flexibility, Decreased range of motion, Pain, Increased edema, Decreased coordination, Increased fascial restricitons, Impaired UE functional use  Visit Diagnosis: Pain in left wrist  Acute pain of left shoulder  Stiffness of left wrist, not elsewhere classified  Stiffness of left shoulder, not elsewhere classified  Other symptoms and signs involving the  musculoskeletal system  Localized edema  Stiffness of left elbow, not elsewhere classified    Problem List Patient Active Problem List   Diagnosis Date Noted  . Encounter for screening colonoscopy 12/31/2015  . Memory difficulty 06/03/2014  . Headache disorder 06/03/2014  . Atrial flutter ([place]) 05/09/2013  . COPD (chronic obstructive pulmonary disease) ([place]) 05/09/2013  . Tobacco abuse 05/08/2013  . LBBB (left bundle branch block)- intermittent 05/08/2013  . Chronic diarrhea 05/11/2011  . Chronic nausea 05/11/2011  . Dyspepsia 02/24/2011  . IBS (irritable bowel syndrome) 02/24/2011  . Chest pain 12/05/2010  . Hypertension 12/05/2010   Guadelupe Sabin, OTR/L  [phone] 06/16/2016, 3:37 PM  [facility] [address] [place], [place], [address] Phone: [phone]   Fax:  [phone]  Name: Melanie Bradley MRN: [id number] Date of Birth: 1950/04/11

## 2016-06-19 ENCOUNTER — Encounter (HOSPITAL_COMMUNITY): Payer: Self-pay | Admitting: Occupational Therapy

## 2016-06-19 ENCOUNTER — Ambulatory Visit (HOSPITAL_COMMUNITY): Payer: BLUE CROSS/BLUE SHIELD | Admitting: Occupational Therapy

## 2016-06-19 DIAGNOSIS — M25532 Pain in left wrist: Secondary | ICD-10-CM

## 2016-06-19 DIAGNOSIS — M25632 Stiffness of left wrist, not elsewhere classified: Secondary | ICD-10-CM

## 2016-06-19 DIAGNOSIS — M25512 Pain in left shoulder: Secondary | ICD-10-CM

## 2016-06-19 DIAGNOSIS — R600 Localized edema: Secondary | ICD-10-CM

## 2016-06-19 DIAGNOSIS — M25612 Stiffness of left shoulder, not elsewhere classified: Secondary | ICD-10-CM

## 2016-06-19 DIAGNOSIS — R29898 Other symptoms and signs involving the musculoskeletal system: Secondary | ICD-10-CM

## 2016-06-19 DIAGNOSIS — M25622 Stiffness of left elbow, not elsewhere classified: Secondary | ICD-10-CM

## 2016-06-19 NOTE — Therapy (Signed)
[place], [place], [address] Phone: [phone]   Fax:  [phone]  Occupational Therapy Treatment  Patient Details  Name: Melanie Bradley MRN: [id number] Date of Birth: [birth year] Referring Provider: Dr. Meredith Pel  Encounter Date: 06/19/2016      OT End of Session - 06/19/16 1516    Visit Number 5   Number of Visits 30   Date for OT Re-Evaluation 08/06/16  mini-reassessment 07/05/2016   Authorization Type [employer]   Authorization Time Period 30 visit limit   Authorization - Visit Number 5   Authorization - Number of Visits 30   OT Start Time 5188   OT Stop Time 1509   OT Time Calculation (min) 83 min   Activity Tolerance Patient tolerated treatment well   Behavior During Therapy [facility] for tasks assessed/performed      Past Medical History:  Diagnosis Date  . Atrial fibrillation ([place])   . Bloating   . Complication of anesthesia     " hard to wake up sometimes "  . Dementia   . Diverticula of colon   . Epigastric burning sensation   . GERD (gastroesophageal reflux disease)   . Headache disorder 06/03/2014  . Headache(784.0)   . Helicobacter pylori gastritis 2004  . Hemorrhoids 06/26/03   Dr. Laural Golden tcs  . Hiatal hernia 06/26/03   Dr. Laural Golden egd  . Hypertension   . Hypothyroidism   . 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 Memory difficulty 06/03/2014  . Shoulder fracture, left   . Spastic colon   . Wrist fracture     Past Surgical History:  Procedure Laterality Date  . APPENDECTOMY    . CHOLECYSTECTOMY    . COLONOSCOPY  06/26/03   Dr. Kem Boroughs diverticula at the sigmoid colon with one of the transverse colon, normal terminal ileoscopy, small external hemorrhoids the  . COLONOSCOPY N/A 01/20/2016   Procedure: COLONOSCOPY;  Surgeon: Daneil Dolin, MD;  [facility];  Service: Endoscopy;  Laterality: N/A;  2:30 pm  . ESOPHAGOGASTRODUODENOSCOPY  03/15/2011    Dr. Trevor Iha hiatal hernia, abnormal gastric mucosa of unclear significance. Gastric biopsies negative, no H. pylori.Procedure: ESOPHAGOGASTRODUODENOSCOPY (EGD);  Surgeon: Daneil Dolin, MD;  [facility];  Service: Endoscopy;  Laterality: N/A;  7:30  . ORIF HUMERUS FRACTURE Left 05/29/2016   Procedure: LEFT OPEN REDUCTION INTERNAL FIXATION (ORIF) PROXIMAL HUMERUS FRACTURE;  Surgeon: Meredith Pel, MD;  Location: [place];  Service: Orthopedics;  Laterality: Left;  . ORIF WRIST FRACTURE Left 05/29/2016   Procedure: OPEN REDUCTION INTERNAL FIXATION (ORIF) LEFT WRIST FRACTURE;  Surgeon: Meredith Pel, MD;  Location: [place];  Service: Orthopedics;  Laterality: Left;  . TUBAL LIGATION    . UPPER GASTROINTESTINAL ENDOSCOPY  06/26/03   Dr. Wynelle Bourgeois sliding hiatal hernia with 8 mm tongue of gastric type mucosa at distal esophagus bile in the stomach.    There were no vitals filed for this visit.      Subjective Assessment - 06/19/16 1347    Subjective  S: I do these exercises 3-4 times a day.    Currently in Pain? No/denies            [facility] OT Assessment - 06/19/16 1346      Assessment   Diagnosis s/p ORIF of left humerus fracture, ORIF left wrist     Precautions   Precautions Shoulder   Type of Shoulder  Precautions Per MD: Begin with P/ROM and progress as tolerated.                   OT Treatments/Exercises (OP) - 06/19/16 1430      Exercises   Exercises Shoulder;Elbow;Wrist;Hand     Shoulder Exercises: Supine   Protraction PROM;AAROM;10 reps   Horizontal ABduction PROM;AAROM;10 reps   External Rotation PROM;AAROM;10 reps   Internal Rotation PROM;AAROM;10 reps   Flexion PROM;AAROM;10 reps   ABduction PROM;AAROM;10 reps     Shoulder Exercises: Seated   Elevation AROM;10 reps   Extension AROM;10 reps   Row AROM;10 reps     Shoulder Exercises: Pulleys   Flexion 1 minute   ABduction 1 minute   ABduction Limitations scaption      Shoulder Exercises: ROM/Strengthening   Wall Wash 1'   Thumb Tacks 1' low     Elbow Exercises   Elbow Extension PROM;AROM;10 reps   Forearm Supination PROM;AROM;10 reps   Forearm Pronation PROM;AROM;10 reps   Wrist Flexion PROM;Self ROM;10 reps   Wrist Extension PROM;Self ROM;10 reps   Other elbow exercises elbow flexion, P/ROM and A/ROM, 10X each     Additional Wrist Exercises   Sponges 12, 18   Theraputty Flatten;Roll;Grip;Pinch  yellow   Theraputty - Pinch yellow- lateral and 3 point     Hand Exercises   Other Hand Exercises Composite digit flexion/extension, 10X     Manual Therapy   Manual Therapy Myofascial release;Edema management   Manual therapy comments Completed separately from therapeutic exercise   Edema Management Retrograde massage performed to left hand, proximal forearm, and upper arm regions to decrease edema and improve range of motion   Myofascial Release Myofascial release to left wrist, volar and dorsal forearm, elbow, upper arm, trapezius, and scapularis regions to decrease pain and fascial restrictions and increase joint range of motion. Scar massage to incisions to improve scar mobility and reduce fascial restrictions.                   OT Short Term Goals - 06/19/16 1521      OT SHORT TERM GOAL #1   Title Pt will be educated on HEP to improve LUE use during daily tasks.    Time 6   Period Weeks   Status On-going     OT SHORT TERM GOAL #2   Title Pt will decrease LUE pain to 5/10 to improve ability to assist with daily task completion.    Time 6   Period Weeks   Status On-going     OT SHORT TERM GOAL #3   Title Pt will decrease LUE fascial restrictions from max to moderate amounts to improve mobility required for functional reaching tasks.    Time 6   Period Weeks   Status On-going     OT SHORT TERM GOAL #4   Title Pt will improve left shoulder P/ROM to [facility] to improve ability to assist with donning shirts.    Time 6   Period Weeks    Status On-going     OT SHORT TERM GOAL #5   Title Pt will improve left elbow A/ROM to WNL to improve ability to use LUE as assist during meals.    Time 6   Period Weeks   Status On-going     OT SHORT TERM GOAL #6   Title Pt will improve left wrist P/ROM to [facility] to improve ability to assist with grooming tasks.    Time 6  Period Weeks   Status On-going     OT SHORT TERM GOAL #7   Title Pt will improve LUE strength to 3-/5 to improve ability to use LUE as assist during bathing tasks.    Time 6   Period Weeks   Status On-going     OT SHORT TERM GOAL #8   Title Pt will increase LUE strength by 10# to improve ability to grasp a cup with LUE.    Time 6   Period Weeks   Status On-going     OT SHORT TERM GOAL  #9   TITLE Pt will improve left pinch strength by 2# to improve ability to grasp utensils/tools with LUE.    Time 6   Period Weeks   Status On-going           OT Long Term Goals - 06/09/16 1710      OT LONG TERM GOAL #1   Title Pt will return to prior level of functioning and independence in ADL and leisure tasks use LUE as assist/non-dominant arm.    Time 12   Period Weeks   Status On-going     OT LONG TERM GOAL #2   Title Pt will decrease LUE pain to 3/10 or less to improve ability to use LUE for functional task completion.    Time 12   Period Weeks   Status On-going     OT LONG TERM GOAL #3   Title Pt will decrease LUE fascial restrictions from mod to min amounts or less to improve mobility required for overhead reaching.    Time 12   Period Weeks   Status On-going     OT LONG TERM GOAL #4   Title Pt will improve left shoulder A/ROM to [facility] to improve ability to wash hair.    Time 12   Period Weeks   Status On-going     OT LONG TERM GOAL #5   Title Pt will improve left wrist A/ROM to [facility] to improve ability to work in flowers.    Time 12   Period Weeks   Status On-going     Long Term Additional Goals   Additional Long Term Goals Yes     OT  LONG TERM GOAL #6   Title Pt will improve LUE strength to 4/5 to improve ability to perform yardwork tasks.    Time 12   Period Weeks   Status On-going     OT LONG TERM GOAL #7   Title Pt will improve left grip strength by 15# to improve ability to grasp plates with left hand.    Time 12   Period Weeks   Status New     OT LONG TERM GOAL #8   Title Pt will improve left pinch strength by 5# to improve ability to grasp cooking supplies with left hand.    Time 12   Period Weeks   Status New               Plan - 06/19/16 1516    Clinical Impression Statement A: Continued with manual therapy, pt able to tolerate greater range with wrist and forearm P/ROM this session. Pt completed shoulder AA/ROM and wrist/forearm self-ROM, requiring consistent cuing for correct form. Verbal and tactile facilitation required due to cognition.    Plan P: Add AA/ROM in sitting, progress to wrist A/ROM; update HEP for AA/ROM and teach caregiver in addition to pt   OT Home Exercise Plan 4/25: shoulder pendulums, elbow  flexion/extension, finger/hand A/ROM   Consulted and Agree with Plan of Care Patient;Family member/caregiver   Family Member Consulted niece      Patient will benefit from skilled therapeutic intervention in order to improve the following deficits and impairments:  Decreased scar mobility, Decreased knowledge of precautions, Decreased activity tolerance, Decreased strength, Impaired flexibility, Decreased range of motion, Pain, Increased edema, Decreased coordination, Increased fascial restricitons, Impaired UE functional use  Visit Diagnosis: Pain in left wrist  Acute pain of left shoulder  Stiffness of left wrist, not elsewhere classified  Stiffness of left shoulder, not elsewhere classified  Other symptoms and signs involving the musculoskeletal system  Localized edema  Stiffness of left elbow, not elsewhere classified    Problem List Patient Active Problem List    Diagnosis Date Noted  . Encounter for screening colonoscopy 12/31/2015  . Memory difficulty 06/03/2014  . Headache disorder 06/03/2014  . Atrial flutter ([place]) 05/09/2013  . COPD (chronic obstructive pulmonary disease) ([place]) 05/09/2013  . Tobacco abuse 05/08/2013  . LBBB (left bundle branch block)- intermittent 05/08/2013  . Chronic diarrhea 05/11/2011  . Chronic nausea 05/11/2011  . Dyspepsia 02/24/2011  . IBS (irritable bowel syndrome) 02/24/2011  . Chest pain 12/05/2010  . Hypertension 12/05/2010   Guadelupe Sabin, OTR/L  [phone] 06/19/2016, 3:22 PM  [facility] [address] [place], [place], [address] Phone: [phone]   Fax:  [phone]  Name: Melanie Bradley MRN: [id number] Date of Birth: 30-Jul-1950

## 2016-06-20 ENCOUNTER — Ambulatory Visit (INDEPENDENT_AMBULATORY_CARE_PROVIDER_SITE_OTHER): Payer: BLUE CROSS/BLUE SHIELD | Admitting: Neurology

## 2016-06-20 ENCOUNTER — Encounter: Payer: Self-pay | Admitting: Neurology

## 2016-06-20 VITALS — BP 143/101 | HR 90 | Ht 61.0 in | Wt 112.5 lb

## 2016-06-20 DIAGNOSIS — R413 Other amnesia: Secondary | ICD-10-CM

## 2016-06-20 MED ORDER — DONEPEZIL HCL 10 MG PO TABS: 10.0000 mg | ORAL_TABLET | Freq: Every day | ORAL | 3 refills | Status: DC

## 2016-06-20 MED ORDER — MEMANTINE HCL 10 MG PO TABS: 10.0000 mg | ORAL_TABLET | Freq: Two times a day (BID) | ORAL | 3 refills | Status: DC

## 2016-06-20 NOTE — Progress Notes (Signed)
Reason for visit: Memory disorder  Melanie Bradley is an 66 y.o. female  History of present illness:  Melanie Bradley is a 66 year old right-handed white female with a history of a progressive memory disturbance. The patient recently had a fall on 05/22/2016 when she tried to get around to a lawnmower and fell backwards and fractured her left humerus and left wrist. The patient required surgery. The patient has done fairly well from this. She did require some pain medications, but she only requires an occasional hydrocodone at this point. The husband does not believe that there has been a significant alteration in her cognitive status since last seen in November 2017. The patient had been operating a car on occasion without difficulty. The patient does cook. She does not manage her own medications or appointments, she does not do the finances. She is sleeping fairly well at night. She returns to this office for an evaluation.  Past Medical History:  Diagnosis Date  . Atrial fibrillation ([place])   . Bloating   . Complication of anesthesia     " hard to wake up sometimes "  . Dementia   . Diverticula of colon   . Epigastric burning sensation   . GERD (gastroesophageal reflux disease)   . Headache disorder 06/03/2014  . Headache(784.0)   . Helicobacter pylori gastritis 2004  . Hemorrhoids 06/26/03   Dr. Laural Golden tcs  . Hiatal hernia 06/26/03   Dr. Laural Golden egd  . Hypertension   . Hypothyroidism   . 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 Memory difficulty 06/03/2014  . Shoulder fracture, left   . Spastic colon   . Wrist fracture     Past Surgical History:  Procedure Laterality Date  . APPENDECTOMY    . CHOLECYSTECTOMY    . COLONOSCOPY  06/26/03   Dr. Kem Boroughs diverticula at the sigmoid colon with one of the transverse colon, normal terminal ileoscopy, small external hemorrhoids the  . COLONOSCOPY N/A 01/20/2016   Procedure: COLONOSCOPY;  Surgeon: Daneil Dolin,  MD;  [facility];  Service: Endoscopy;  Laterality: N/A;  2:30 pm  . ESOPHAGOGASTRODUODENOSCOPY  03/15/2011   Dr. Trevor Iha hiatal hernia, abnormal gastric mucosa of unclear significance. Gastric biopsies negative, no H. pylori.Procedure: ESOPHAGOGASTRODUODENOSCOPY (EGD);  Surgeon: Daneil Dolin, MD;  [facility];  Service: Endoscopy;  Laterality: N/A;  7:30  . ORIF HUMERUS FRACTURE Left 05/29/2016   Procedure: LEFT OPEN REDUCTION INTERNAL FIXATION (ORIF) PROXIMAL HUMERUS FRACTURE;  Surgeon: Meredith Pel, MD;  Location: [place];  Service: Orthopedics;  Laterality: Left;  . ORIF WRIST FRACTURE Left 05/29/2016   Procedure: OPEN REDUCTION INTERNAL FIXATION (ORIF) LEFT WRIST FRACTURE;  Surgeon: Meredith Pel, MD;  Location: [place];  Service: Orthopedics;  Laterality: Left;  . TUBAL LIGATION    . UPPER GASTROINTESTINAL ENDOSCOPY  06/26/03   Dr. Wynelle Bourgeois sliding hiatal hernia with 8 mm tongue of gastric type mucosa at distal esophagus bile in the stomach.    Family History  Problem Relation Age of Onset  . Hypertension Mother   . Angina Mother   . COPD Father   . Heart failure Father   . Cancer Sister   . Dementia Sister   . Cardiomyopathy Brother   . Pneumonia Other   . Migraines Sister   . Colon cancer Neg Hx     Social history:  reports that she has been smoking Cigarettes.  She started smoking about  49 years ago. She has a 67.50 pack-year smoking history. She has never used smokeless tobacco. She reports that she does not drink alcohol or use drugs.    Allergies  Allergen Reactions  . Penicillins Hives and Swelling    Has patient had a PCN reaction causing IMMEDIATE RASH, FACIAL/TONGUE/THROAT SWELLING, SOB, OR LIGHTHEADEDNESS WITH HYPOTENSION:  #  #  #  YES  #  #  #  Has patient had a PCN reaction causing severe rash involving mucus membranes or skin necrosis: No Has patient had a PCN reaction that required hospitalization No Has patient had a  PCN reaction occurring within the last 10 years: No If all of the above answers are "NO", then may proceed with Cephalosporin use.   . Benadryl [Diphenhydramine Hcl] Hives  . Msm [Methylsulfonylmethane] Hives  . Nubain [Nalbuphine Hcl] Hives  . Sulfa Antibiotics Hives  . Codeine     UNSPECIFIED REACTION [IF AT ALL] REFUSES TO TAKE    Medications:  Prior to Admission medications   Medication Sig Start Date End Date Taking? Authorizing Provider  aspirin 81 MG tablet Take 81 mg by mouth daily.   Yes [provider]  Cyanocobalamin (B-12 PO) Take 6,000 mcg by mouth daily.   Yes [provider]  donepezil (ARICEPT) 10 MG tablet Take 1 tablet (10 mg total) by mouth at bedtime. 06/21/15  Yes Ward Givens, NP  escitalopram (LEXAPRO) 10 MG tablet Take 10 mg by mouth every evening.  10/16/14  Yes [provider]  levothyroxine (SYNTHROID, LEVOTHROID) 100 MCG tablet Take 100 mcg by mouth daily. **BRAND NAME ONLY** DOES NOT TAKE ON FRIDAY   Yes [provider]  losartan-hydrochlorothiazide (HYZAAR) 100-12.5 MG tablet Take 1 tablet by mouth daily.  12/20/15  Yes [provider]  memantine (NAMENDA) 10 MG tablet Take 1 tablet (10 mg total) by mouth 2 (two) times daily. 06/21/15  Yes Ward Givens, NP  OVER THE COUNTER MEDICATION Take 1 tablet by mouth daily. cognisense otc supplement   Yes [provider]  potassium chloride SA (K-DUR,KLOR-CON) 20 MEQ tablet Take 1 tablet (20 mEq total) by mouth 2 (two) times daily. 05/25/16  Yes Shary Decamp, PA-C    ROS:  Out of a complete 14 system review of symptoms, the patient complains only of the following symptoms, and all other reviewed systems are negative.  Memory disturbance  Blood pressure (!) 143/101, pulse 90, height 5\' 1"  (1.549 m), weight 112 lb 8 oz (51 kg).  Physical Exam  General: The patient is alert and cooperative at the time of the examination.  Skin: No significant peripheral edema is  noted.   Neurologic Exam  Mental status: The patient is alert and oriented x 3 at the time of the examination. The Mini-Mental Status Examination done today shows a total score of 13/30..   Cranial nerves: Facial symmetry is present. Speech is normal, no aphasia or dysarthria is noted. Extraocular movements are full. Visual fields are full.  Motor: The patient has good strength in all 4 extremities.  Sensory examination: Soft touch sensation is symmetric on the face, arms, and legs.  Coordination: The patient has good finger-nose-finger and heel-to-shin bilaterally. Minimal apraxia with the use of the lower extremities is noted.  Gait and station: The patient has a normal gait. Tandem gait is slightly unsteady. Romberg is negative. No drift is seen.  Reflexes: Deep tendon reflexes are symmetric.   Assessment/Plan:  1. Progressive memory disturbance  The patient has had  a large change in the Mini-Mental Status Examination today as compared to the fall of 2017. The patient has been on some pain medications, however. We will continue to watch the patient, the husband does not believe that there has been any significant alteration in her ability to function. The patient will follow-up in 6 months, she will continue the Aricept and Namenda, a prescription was written for these medications.  Jill Alexanders MD 06/20/2016 12:37 PM  [facility] [address] [place], [place] [address]  Phone [phone] Fax [phone]

## 2016-06-21 ENCOUNTER — Encounter (HOSPITAL_COMMUNITY): Payer: Self-pay | Admitting: Occupational Therapy

## 2016-06-21 ENCOUNTER — Ambulatory Visit (HOSPITAL_COMMUNITY): Payer: BLUE CROSS/BLUE SHIELD | Admitting: Occupational Therapy

## 2016-06-21 DIAGNOSIS — M25532 Pain in left wrist: Secondary | ICD-10-CM | POA: Diagnosis not present

## 2016-06-21 DIAGNOSIS — M25512 Pain in left shoulder: Secondary | ICD-10-CM

## 2016-06-21 DIAGNOSIS — R29898 Other symptoms and signs involving the musculoskeletal system: Secondary | ICD-10-CM

## 2016-06-21 DIAGNOSIS — R600 Localized edema: Secondary | ICD-10-CM

## 2016-06-21 DIAGNOSIS — M25612 Stiffness of left shoulder, not elsewhere classified: Secondary | ICD-10-CM

## 2016-06-21 DIAGNOSIS — M25622 Stiffness of left elbow, not elsewhere classified: Secondary | ICD-10-CM

## 2016-06-21 DIAGNOSIS — M25632 Stiffness of left wrist, not elsewhere classified: Secondary | ICD-10-CM

## 2016-06-21 NOTE — Patient Instructions (Addendum)
Perform each exercise ____10-15____ reps. 2-3x days. Perform each exercise while lying on your back AND sitting up in chair.   Protraction   Start by holding a wand or cane at chest height.  Next, slowly push the wand outwards in front of your body so that your elbows become fully straightened. Then, return to the original position.     Shoulder FLEXION   In the standing position, hold a wand/cane with both arms, palms down on both sides. Raise up the wand/cane allowing your unaffected arm to perform most of the effort. Your affected arm should be partially relaxed.      Internal/External ROTATION  In the standing position, hold a wand/cane with both hands keeping your elbows bent. Move your arms and wand/cane to one side.  Your affected arm should be partially relaxed while your unaffected arm performs most of the effort.       Shoulder ABDUCTION   While holding a wand/cane palm face up on the injured side and palm face down on the uninjured side, slowly raise up your injured arm to the side.                     Horizontal Abduction/Adduction      Straight arms holding cane at shoulder height, bring cane to right, center, left. Repeat starting to left.   Copyright  VHI. All rights reserved.

## 2016-06-21 NOTE — Therapy (Signed)
[place], [place], [address] Phone: [phone]   Fax:  [phone]  Occupational Therapy Treatment  Patient Details  Name: Melanie Bradley MRN: [id number] Date of Birth: [birth year] Referring Provider: Dr. Meredith Pel  Encounter Date: 06/21/2016      OT End of Session - 06/21/16 1611    Visit Number 6   Number of Visits 30   Date for OT Re-Evaluation 08/06/16  mini-reassessment 07/05/2016   Authorization Type BCBS   Authorization Time Period 30 visit limit   Authorization - Visit Number 6   Authorization - Number of Visits 30   OT Start Time 1350   OT Stop Time 1510   OT Time Calculation (min) 80 min   Activity Tolerance Patient tolerated treatment well   Behavior During Therapy [facility] for tasks assessed/performed      Past Medical History:  Diagnosis Date  . Atrial fibrillation ([place])   . Bloating   . Complication of anesthesia     " hard to wake up sometimes "  . Dementia   . Diverticula of colon   . Epigastric burning sensation   . GERD (gastroesophageal reflux disease)   . Headache disorder 06/03/2014  . Headache(784.0)   . Helicobacter pylori gastritis 2004  . Hemorrhoids 06/26/03   Dr. Laural Golden tcs  . Hiatal hernia 06/26/03   Dr. Laural Golden egd  . Hypertension   . Hypothyroidism   . 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 Memory difficulty 06/03/2014  . Shoulder fracture, left   . Spastic colon   . Wrist fracture     Past Surgical History:  Procedure Laterality Date  . APPENDECTOMY    . CHOLECYSTECTOMY    . COLONOSCOPY  06/26/03   Dr. Kem Boroughs diverticula at the sigmoid colon with one of the transverse colon, normal terminal ileoscopy, small external hemorrhoids the  . COLONOSCOPY N/A 01/20/2016   Procedure: COLONOSCOPY;  Surgeon: Daneil Dolin, MD;  [facility];  Service: Endoscopy;  Laterality: N/A;  2:30 pm  . ESOPHAGOGASTRODUODENOSCOPY  03/15/2011    Dr. Trevor Iha hiatal hernia, abnormal gastric mucosa of unclear significance. Gastric biopsies negative, no H. pylori.Procedure: ESOPHAGOGASTRODUODENOSCOPY (EGD);  Surgeon: Daneil Dolin, MD;  [facility];  Service: Endoscopy;  Laterality: N/A;  7:30  . ORIF HUMERUS FRACTURE Left 05/29/2016   Procedure: LEFT OPEN REDUCTION INTERNAL FIXATION (ORIF) PROXIMAL HUMERUS FRACTURE;  Surgeon: Meredith Pel, MD;  Location: [place];  Service: Orthopedics;  Laterality: Left;  . ORIF WRIST FRACTURE Left 05/29/2016   Procedure: OPEN REDUCTION INTERNAL FIXATION (ORIF) LEFT WRIST FRACTURE;  Surgeon: Meredith Pel, MD;  Location: [place];  Service: Orthopedics;  Laterality: Left;  . TUBAL LIGATION    . UPPER GASTROINTESTINAL ENDOSCOPY  06/26/03   Dr. Wynelle Bourgeois sliding hiatal hernia with 8 mm tongue of gastric type mucosa at distal esophagus bile in the stomach.    There were no vitals filed for this visit.      Subjective Assessment - 06/21/16 1350    Subjective  S: It's feeling a little sore today.    Currently in Pain? Yes   Pain Score 2    Pain Location Shoulder   Pain Orientation Left   Pain Descriptors / Indicators Sore   Pain Type Acute pain   Pain Radiating Towards none   Pain Onset More than a month ago   Pain Frequency Intermittent  Aggravating Factors  movement   Pain Relieving Factors rest   Effect of Pain on Daily Activities mod effect on LUE usage during functional task completion   Multiple Pain Sites No            [facility] OT Assessment - 06/21/16 1350      Assessment   Diagnosis s/p ORIF of left humerus fracture, ORIF left wrist     Precautions   Precautions Shoulder   Type of Shoulder Precautions Per MD: Begin with P/ROM and progress as tolerated.                   OT Treatments/Exercises (OP) - 06/21/16 1355      Exercises   Exercises Shoulder;Elbow;Wrist;Hand     Shoulder Exercises: Supine   Protraction PROM;AAROM;10 reps    Horizontal ABduction PROM;AAROM;10 reps   External Rotation PROM;AAROM;10 reps   Internal Rotation PROM;AAROM;10 reps   Flexion PROM;AAROM;10 reps   ABduction PROM;AAROM;10 reps     Shoulder Exercises: Seated   Protraction AAROM;10 reps   Horizontal ABduction AAROM;10 reps   External Rotation AAROM;10 reps   Internal Rotation AAROM;10 reps   Flexion AAROM;10 reps   Abduction AAROM;10 reps     Shoulder Exercises: Pulleys   Flexion 1 minute   ABduction 1 minute   ABduction Limitations scaption     Elbow Exercises   Elbow Extension PROM;AROM;10 reps   Forearm Supination PROM;AROM;10 reps   Forearm Pronation PROM;AROM;10 reps   Wrist Flexion PROM;AROM;10 reps   Wrist Extension PROM;AROM;10 reps   Other elbow exercises elbow flexion, P/ROM and A/ROM, 10X each     Wrist Exercises   Wrist Radial Deviation PROM;Self ROM;10 reps   Wrist Ulnar Deviation PROM;Self ROM;10 reps     Manual Therapy   Manual Therapy Myofascial release;Edema management   Manual therapy comments Completed separately from therapeutic exercise   Edema Management Retrograde massage performed to left hand, proximal forearm, and upper arm regions to decrease edema and improve range of motion   Myofascial Release Myofascial release to left wrist, volar and dorsal forearm, elbow, upper arm, trapezius, and scapularis regions to decrease pain and fascial restrictions and increase joint range of motion. Scar massage to incisions to improve scar mobility and reduce fascial restrictions.                 OT Education - 06/21/16 1437    Education provided Yes   Education Details AA/ROM   Person(s) Educated Patient   Methods Explanation;Demonstration;Handout   Comprehension Verbalized understanding;Returned demonstration          OT Short Term Goals - 06/19/16 1521      OT SHORT TERM GOAL #1   Title Pt will be educated on HEP to improve LUE use during daily tasks.    Time 6   Period Weeks   Status  On-going     OT SHORT TERM GOAL #2   Title Pt will decrease LUE pain to 5/10 to improve ability to assist with daily task completion.    Time 6   Period Weeks   Status On-going     OT SHORT TERM GOAL #3   Title Pt will decrease LUE fascial restrictions from max to moderate amounts to improve mobility required for functional reaching tasks.    Time 6   Period Weeks   Status On-going     OT SHORT TERM GOAL #4   Title Pt will improve left shoulder P/ROM to [facility] to improve  ability to assist with donning shirts.    Time 6   Period Weeks   Status On-going     OT SHORT TERM GOAL #5   Title Pt will improve left elbow A/ROM to WNL to improve ability to use LUE as assist during meals.    Time 6   Period Weeks   Status On-going     OT SHORT TERM GOAL #6   Title Pt will improve left wrist P/ROM to [facility] to improve ability to assist with grooming tasks.    Time 6   Period Weeks   Status On-going     OT SHORT TERM GOAL #7   Title Pt will improve LUE strength to 3-/5 to improve ability to use LUE as assist during bathing tasks.    Time 6   Period Weeks   Status On-going     OT SHORT TERM GOAL #8   Title Pt will increase LUE strength by 10# to improve ability to grasp a cup with LUE.    Time 6   Period Weeks   Status On-going     OT SHORT TERM GOAL  #9   TITLE Pt will improve left pinch strength by 2# to improve ability to grasp utensils/tools with LUE.    Time 6   Period Weeks   Status On-going           OT Long Term Goals - 06/09/16 1710      OT LONG TERM GOAL #1   Title Pt will return to prior level of functioning and independence in ADL and leisure tasks use LUE as assist/non-dominant arm.    Time 12   Period Weeks   Status On-going     OT LONG TERM GOAL #2   Title Pt will decrease LUE pain to 3/10 or less to improve ability to use LUE for functional task completion.    Time 12   Period Weeks   Status On-going     OT LONG TERM GOAL #3   Title Pt will decrease  LUE fascial restrictions from mod to min amounts or less to improve mobility required for overhead reaching.    Time 12   Period Weeks   Status On-going     OT LONG TERM GOAL #4   Title Pt will improve left shoulder A/ROM to [facility] to improve ability to wash hair.    Time 12   Period Weeks   Status On-going     OT LONG TERM GOAL #5   Title Pt will improve left wrist A/ROM to [facility] to improve ability to work in flowers.    Time 12   Period Weeks   Status On-going     Long Term Additional Goals   Additional Long Term Goals Yes     OT LONG TERM GOAL #6   Title Pt will improve LUE strength to 4/5 to improve ability to perform yardwork tasks.    Time 12   Period Weeks   Status On-going     OT LONG TERM GOAL #7   Title Pt will improve left grip strength by 15# to improve ability to grasp plates with left hand.    Time 12   Period Weeks   Status New     OT LONG TERM GOAL #8   Title Pt will improve left pinch strength by 5# to improve ability to grasp cooking supplies with left hand.    Time 12   Period Weeks   Status New  Plan - 06/21/16 1612    Clinical Impression Statement A: Continued with manual therapy, pt able to tolerate slight increase in ROM with passive stretching, continues to be pain limited at end range. Added A/ROM in sitting, as well as wrist A/ROM. Consistent verbal cuing for correct form and techqniue, improved independence with supine exercises. Pt educated on HEP and provided with handout including instructions and pictures.    Plan P: Follow up on HEP, continue with AA/ROM for shoulder and A/ROM for wrist; Continue working to improve range within pain tolerance   OT Home Exercise Plan 4/25: shoulder pendulums, elbow flexion/extension, finger/hand A/ROM; 5/9: AA/ROM shoulder   Consulted and Agree with Plan of Care Patient      Patient will benefit from skilled therapeutic intervention in order to improve the following deficits and  impairments:  Decreased scar mobility, Decreased knowledge of precautions, Decreased activity tolerance, Decreased strength, Impaired flexibility, Decreased range of motion, Pain, Increased edema, Decreased coordination, Increased fascial restricitons, Impaired UE functional use  Visit Diagnosis: Pain in left wrist  Acute pain of left shoulder  Stiffness of left wrist, not elsewhere classified  Stiffness of left shoulder, not elsewhere classified  Other symptoms and signs involving the musculoskeletal system  Localized edema  Stiffness of left elbow, not elsewhere classified    Problem List Patient Active Problem List   Diagnosis Date Noted  . Encounter for screening colonoscopy 12/31/2015  . Memory difficulty 06/03/2014  . Headache disorder 06/03/2014  . Atrial flutter ([place]) 05/09/2013  . COPD (chronic obstructive pulmonary disease) ([place]) 05/09/2013  . Tobacco abuse 05/08/2013  . LBBB (left bundle branch block)- intermittent 05/08/2013  . Chronic diarrhea 05/11/2011  . Chronic nausea 05/11/2011  . Dyspepsia 02/24/2011  . IBS (irritable bowel syndrome) 02/24/2011  . Chest pain 12/05/2010  . Hypertension 12/05/2010   Guadelupe Sabin, OTR/L  [phone] 06/21/2016, 4:14 PM  [place] [address] [place], [place], [address] Phone: [phone]   Fax:  [phone]  Name: ARBADELLA KIMBLER MRN: [id number] Date of Birth: 1950-08-21

## 2016-06-23 ENCOUNTER — Encounter (HOSPITAL_COMMUNITY): Payer: Self-pay | Admitting: Occupational Therapy

## 2016-06-23 ENCOUNTER — Ambulatory Visit (HOSPITAL_COMMUNITY): Payer: BLUE CROSS/BLUE SHIELD | Admitting: Occupational Therapy

## 2016-06-23 DIAGNOSIS — M25512 Pain in left shoulder: Secondary | ICD-10-CM

## 2016-06-23 DIAGNOSIS — M25632 Stiffness of left wrist, not elsewhere classified: Secondary | ICD-10-CM

## 2016-06-23 DIAGNOSIS — M25622 Stiffness of left elbow, not elsewhere classified: Secondary | ICD-10-CM

## 2016-06-23 DIAGNOSIS — M25532 Pain in left wrist: Secondary | ICD-10-CM

## 2016-06-23 DIAGNOSIS — M25612 Stiffness of left shoulder, not elsewhere classified: Secondary | ICD-10-CM

## 2016-06-23 DIAGNOSIS — R29898 Other symptoms and signs involving the musculoskeletal system: Secondary | ICD-10-CM

## 2016-06-23 NOTE — Therapy (Signed)
[place], [place], [address] Phone: [phone]   Fax:  [phone]  Occupational Therapy Treatment  Patient Details  Name: Melanie Bradley MRN: [id number] Date of Birth: Feb 11, 1951 Referring Provider: Dr. Meredith Pel  Encounter Date: 06/23/2016      OT End of Session - 06/23/16 1620    Visit Number 7   Number of Visits 30   Date for OT Re-Evaluation 08/06/16  mini-reassessment 07/05/2016   Authorization Type BCBS   Authorization Time Period 30 visit limit   Authorization - Visit Number 7   Authorization - Number of Visits 30   OT Start Time 0626   OT Stop Time 1600   OT Time Calculation (min) 87 min   Activity Tolerance Patient tolerated treatment well   Behavior During Therapy [facility] for tasks assessed/performed      Past Medical History:  Diagnosis Date  . Atrial fibrillation ([place])   . Bloating   . Complication of anesthesia     " hard to wake up sometimes "  . Dementia   . Diverticula of colon   . Epigastric burning sensation   . GERD (gastroesophageal reflux disease)   . Headache disorder 06/03/2014  . Headache(784.0)   . Helicobacter pylori gastritis 2004  . Hemorrhoids 06/26/03   Dr. Laural Golden tcs  . Hiatal hernia 06/26/03   Dr. Laural Golden egd  . Hypertension   . Hypothyroidism   . 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 Memory difficulty 06/03/2014  . Shoulder fracture, left   . Spastic colon   . Wrist fracture     Past Surgical History:  Procedure Laterality Date  . APPENDECTOMY    . CHOLECYSTECTOMY    . COLONOSCOPY  06/26/03   Dr. Kem Boroughs diverticula at the sigmoid colon with one of the transverse colon, normal terminal ileoscopy, small external hemorrhoids the  . COLONOSCOPY N/A 01/20/2016   Procedure: COLONOSCOPY;  Surgeon: Daneil Dolin, MD;  [facility];  Service: Endoscopy;  Laterality: N/A;  2:30 pm  . ESOPHAGOGASTRODUODENOSCOPY  03/15/2011    Dr. Trevor Iha hiatal hernia, abnormal gastric mucosa of unclear significance. Gastric biopsies negative, no H. pylori.Procedure: ESOPHAGOGASTRODUODENOSCOPY (EGD);  Surgeon: Daneil Dolin, MD;  [facility];  Service: Endoscopy;  Laterality: N/A;  7:30  . ORIF HUMERUS FRACTURE Left 05/29/2016   Procedure: LEFT OPEN REDUCTION INTERNAL FIXATION (ORIF) PROXIMAL HUMERUS FRACTURE;  Surgeon: Meredith Pel, MD;  Location: [place];  Service: Orthopedics;  Laterality: Left;  . ORIF WRIST FRACTURE Left 05/29/2016   Procedure: OPEN REDUCTION INTERNAL FIXATION (ORIF) LEFT WRIST FRACTURE;  Surgeon: Meredith Pel, MD;  Location: [place];  Service: Orthopedics;  Laterality: Left;  . TUBAL LIGATION    . UPPER GASTROINTESTINAL ENDOSCOPY  06/26/03   Dr. Wynelle Bourgeois sliding hiatal hernia with 8 mm tongue of gastric type mucosa at distal esophagus bile in the stomach.    There were no vitals filed for this visit.      Subjective Assessment - 06/23/16 1435    Subjective  S: I've only been doing what you told me to.    Currently in Pain? No/denies            [facility] OT Assessment - 06/23/16 1436      Assessment   Diagnosis s/p ORIF of left humerus fracture, ORIF left wrist     Precautions   Precautions Shoulder   Type of  Shoulder Precautions Per MD: Begin with P/ROM and progress as tolerated.                   OT Treatments/Exercises (OP) - 06/23/16 1436      Exercises   Exercises Shoulder;Elbow;Wrist;Hand     Shoulder Exercises: Supine   Protraction PROM;10 reps;AAROM;12 reps   Horizontal ABduction PROM;10 reps;AAROM;12 reps   External Rotation PROM;10 reps;AAROM;12 reps   Internal Rotation PROM;10 reps;AAROM;12 reps   Flexion PROM;10 reps;AAROM;12 reps   ABduction PROM;10 reps;AAROM;12 reps     Shoulder Exercises: Seated   Protraction AAROM;10 reps   Horizontal ABduction AAROM;10 reps   External Rotation AAROM;10 reps   Internal Rotation AAROM;10 reps    Flexion AAROM;10 reps   Abduction AAROM;10 reps     Shoulder Exercises: Pulleys   Flexion 1 minute   ABduction 1 minute     Shoulder Exercises: ROM/Strengthening   Wall Wash 1'   Thumb Tacks 1'   Proximal Shoulder Strengthening, Supine 10X each, no rest breaks  mod assist for technique     Elbow Exercises   Elbow Extension PROM;AROM;10 reps   Forearm Supination PROM;AROM;10 reps   Forearm Pronation PROM;AROM;10 reps   Wrist Flexion PROM;AROM;10 reps   Wrist Extension PROM;AROM;10 reps   Other elbow exercises elbow flexion, P/ROM and A/ROM, 10X each     Wrist Exercises   Wrist Radial Deviation PROM;Self ROM;10 reps   Wrist Ulnar Deviation PROM;Self ROM;10 reps     Additional Wrist Exercises   Theraputty Roll;Grip;Pinch  yellow   Theraputty - Pinch yellow- lateral and 3 point     Hand Exercises   Digiticizer 3#, 10X     Functional Reaching Activities   Low Level Pt placed yellow and red clothespins on along horizontal bar of pinch tree, focusing on pinch strength and elbow flexion/extension. Min difficulty with pinching     Manual Therapy   Manual Therapy Myofascial release   Manual therapy comments Completed separately from therapeutic exercise   Myofascial Release Myofascial release to left wrist, volar and dorsal forearm, elbow, upper arm, trapezius, and scapularis regions to decrease pain and fascial restrictions and increase joint range of motion. Scar massage to incisions to improve scar mobility and reduce fascial restrictions.                   OT Short Term Goals - 06/21/16 1615      OT SHORT TERM GOAL #1   Title Pt will be educated on HEP to improve LUE use during daily tasks.    Time 6   Period Weeks   Status On-going     OT SHORT TERM GOAL #2   Title Pt will decrease LUE pain to 5/10 to improve ability to assist with daily task completion.    Time 6   Period Weeks   Status On-going     OT SHORT TERM GOAL #3   Title Pt will decrease LUE  fascial restrictions from max to moderate amounts to improve mobility required for functional reaching tasks.    Time 6   Period Weeks   Status On-going     OT SHORT TERM GOAL #4   Title Pt will improve left shoulder P/ROM to [facility] to improve ability to assist with donning shirts.    Time 6   Period Weeks   Status On-going     OT SHORT TERM GOAL #5   Title Pt will improve left elbow A/ROM to WNL to improve  ability to use LUE as assist during meals.    Time 6   Period Weeks   Status On-going     OT SHORT TERM GOAL #6   Title Pt will improve left wrist P/ROM to [facility] to improve ability to assist with grooming tasks.    Time 6   Period Weeks   Status On-going     OT SHORT TERM GOAL #7   Title Pt will improve LUE strength to 3-/5 to improve ability to use LUE as assist during bathing tasks.    Time 6   Period Weeks   Status On-going     OT SHORT TERM GOAL #8   Title Pt will increase LUE strength by 10# to improve ability to grasp a cup with LUE.    Time 6   Period Weeks   Status On-going     OT SHORT TERM GOAL  #9   TITLE Pt will improve left pinch strength by 2# to improve ability to grasp utensils/tools with LUE.    Time 6   Period Weeks   Status On-going           OT Long Term Goals - 06/09/16 1710      OT LONG TERM GOAL #1   Title Pt will return to prior level of functioning and independence in ADL and leisure tasks use LUE as assist/non-dominant arm.    Time 12   Period Weeks   Status On-going     OT LONG TERM GOAL #2   Title Pt will decrease LUE pain to 3/10 or less to improve ability to use LUE for functional task completion.    Time 12   Period Weeks   Status On-going     OT LONG TERM GOAL #3   Title Pt will decrease LUE fascial restrictions from mod to min amounts or less to improve mobility required for overhead reaching.    Time 12   Period Weeks   Status On-going     OT LONG TERM GOAL #4   Title Pt will improve left shoulder A/ROM to [facility] to  improve ability to wash hair.    Time 12   Period Weeks   Status On-going     OT LONG TERM GOAL #5   Title Pt will improve left wrist A/ROM to [facility] to improve ability to work in flowers.    Time 12   Period Weeks   Status On-going     Long Term Additional Goals   Additional Long Term Goals Yes     OT LONG TERM GOAL #6   Title Pt will improve LUE strength to 4/5 to improve ability to perform yardwork tasks.    Time 12   Period Weeks   Status On-going     OT LONG TERM GOAL #7   Title Pt will improve left grip strength by 15# to improve ability to grasp plates with left hand.    Time 12   Period Weeks   Status New     OT LONG TERM GOAL #8   Title Pt will improve left pinch strength by 5# to improve ability to grasp cooking supplies with left hand.    Time 12   Period Weeks   Status New               Plan - 06/23/16 1621    Clinical Impression Statement A: Increased supine AA/ROM repetitions to 12, pt able to tolerate shoulder P/ROM to 60% this session,  pain limited at end range. Pt requires consistent verbal cuing for form and technique. Added pinch tree with focus on pinch strength, elbow ROM, and functional reaching.    Plan P: Continue working on improving range within pain tolerance, add anterior and caudal glides   OT Home Exercise Plan 4/25: shoulder pendulums, elbow flexion/extension, finger/hand A/ROM; 5/9: AA/ROM shoulder   Consulted and Agree with Plan of Care Patient;Family member/caregiver   Family Member Consulted sister-Dot      Patient will benefit from skilled therapeutic intervention in order to improve the following deficits and impairments:     Visit Diagnosis: Pain in left wrist  Acute pain of left shoulder  Stiffness of left wrist, not elsewhere classified  Stiffness of left shoulder, not elsewhere classified  Other symptoms and signs involving the musculoskeletal system  Stiffness of left elbow, not elsewhere classified    Problem  List Patient Active Problem List   Diagnosis Date Noted  . Encounter for screening colonoscopy 12/31/2015  . Memory difficulty 06/03/2014  . Headache disorder 06/03/2014  . Atrial flutter ([place]) 05/09/2013  . COPD (chronic obstructive pulmonary disease) ([place]) 05/09/2013  . Tobacco abuse 05/08/2013  . LBBB (left bundle branch block)- intermittent 05/08/2013  . Chronic diarrhea 05/11/2011  . Chronic nausea 05/11/2011  . Dyspepsia 02/24/2011  . IBS (irritable bowel syndrome) 02/24/2011  . Chest pain 12/05/2010  . Hypertension 12/05/2010   Guadelupe Sabin, OTR/L  [phone] 06/23/2016, 4:24 PM  [place] [address] [place], [place], [address] Phone: [phone]   Fax:  [phone]  Name: KRISTYANA NOTTE MRN: [id number] Date of Birth: [birth year]

## 2016-06-26 ENCOUNTER — Encounter (INDEPENDENT_AMBULATORY_CARE_PROVIDER_SITE_OTHER): Payer: Self-pay | Admitting: Orthopedic Surgery

## 2016-06-26 ENCOUNTER — Ambulatory Visit (INDEPENDENT_AMBULATORY_CARE_PROVIDER_SITE_OTHER): Payer: BLUE CROSS/BLUE SHIELD | Admitting: Orthopedic Surgery

## 2016-06-26 ENCOUNTER — Ambulatory Visit (INDEPENDENT_AMBULATORY_CARE_PROVIDER_SITE_OTHER): Payer: BLUE CROSS/BLUE SHIELD

## 2016-06-26 DIAGNOSIS — S42292D Other displaced fracture of upper end of left humerus, subsequent encounter for fracture with routine healing: Secondary | ICD-10-CM | POA: Diagnosis not present

## 2016-06-26 DIAGNOSIS — S42309A Unspecified fracture of shaft of humerus, unspecified arm, initial encounter for closed fracture: Secondary | ICD-10-CM | POA: Insufficient documentation

## 2016-06-26 DIAGNOSIS — S62109A Fracture of unspecified carpal bone, unspecified wrist, initial encounter for closed fracture: Secondary | ICD-10-CM | POA: Insufficient documentation

## 2016-06-26 DIAGNOSIS — S62102D Fracture of unspecified carpal bone, left wrist, subsequent encounter for fracture with routine healing: Secondary | ICD-10-CM | POA: Diagnosis not present

## 2016-06-27 ENCOUNTER — Encounter (HOSPITAL_COMMUNITY): Payer: Self-pay | Admitting: Occupational Therapy

## 2016-06-27 ENCOUNTER — Ambulatory Visit (HOSPITAL_COMMUNITY): Payer: BLUE CROSS/BLUE SHIELD | Admitting: Occupational Therapy

## 2016-06-27 DIAGNOSIS — M25532 Pain in left wrist: Secondary | ICD-10-CM

## 2016-06-27 DIAGNOSIS — M25612 Stiffness of left shoulder, not elsewhere classified: Secondary | ICD-10-CM

## 2016-06-27 DIAGNOSIS — M25512 Pain in left shoulder: Secondary | ICD-10-CM

## 2016-06-27 DIAGNOSIS — M25632 Stiffness of left wrist, not elsewhere classified: Secondary | ICD-10-CM

## 2016-06-27 DIAGNOSIS — R29898 Other symptoms and signs involving the musculoskeletal system: Secondary | ICD-10-CM

## 2016-06-27 NOTE — Therapy (Signed)
[place], [place], [address] Phone: [phone]   Fax:  [phone]  Occupational Therapy Treatment  Patient Details  Name: Melanie Bradley MRN: [id number] Date of Birth: 17-Sep-1950 Referring Provider: Dr. Meredith Pel  Encounter Date: 06/27/2016      OT End of Session - 06/27/16 1623    Visit Number 8   Number of Visits 30   Date for OT Re-Evaluation 08/06/16  mini-reassessment 07/05/2016   Authorization Type BCBS   Authorization Time Period 30 visit limit   Authorization - Visit Number 8   Authorization - Number of Visits 30   OT Start Time 2440   OT Stop Time 1430   OT Time Calculation (min) 85 min   Activity Tolerance Patient tolerated treatment well   Behavior During Therapy [facility] for tasks assessed/performed      Past Medical History:  Diagnosis Date  . Atrial fibrillation ([address])   . Bloating   . Complication of anesthesia     " hard to wake up sometimes "  . Dementia   . Diverticula of colon   . Epigastric burning sensation   . GERD (gastroesophageal reflux disease)   . Headache disorder 06/03/2014  . Headache(784.0)   . Helicobacter pylori gastritis 2004  . Hemorrhoids 06/26/03   Dr. Laural Golden tcs  . Hiatal hernia 06/26/03   Dr. Laural Golden egd  . Hypertension   . Hypothyroidism   . 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 Memory difficulty 06/03/2014  . Shoulder fracture, left   . Spastic colon   . Wrist fracture     Past Surgical History:  Procedure Laterality Date  . APPENDECTOMY    . CHOLECYSTECTOMY    . COLONOSCOPY  06/26/03   Dr. Kem Boroughs diverticula at the sigmoid colon with one of the transverse colon, normal terminal ileoscopy, small external hemorrhoids the  . COLONOSCOPY N/A 01/20/2016   Procedure: COLONOSCOPY;  Surgeon: Daneil Dolin, MD;  [facility];  Service: Endoscopy;  Laterality: N/A;  2:30 pm  . ESOPHAGOGASTRODUODENOSCOPY  03/15/2011    Dr. Trevor Iha hiatal hernia, abnormal gastric mucosa of unclear significance. Gastric biopsies negative, no H. pylori.Procedure: ESOPHAGOGASTRODUODENOSCOPY (EGD);  Surgeon: Daneil Dolin, MD;  [facility];  Service: Endoscopy;  Laterality: N/A;  7:30  . ORIF HUMERUS FRACTURE Left 05/29/2016   Procedure: LEFT OPEN REDUCTION INTERNAL FIXATION (ORIF) PROXIMAL HUMERUS FRACTURE;  Surgeon: Meredith Pel, MD;  Location: [place];  Service: Orthopedics;  Laterality: Left;  . ORIF WRIST FRACTURE Left 05/29/2016   Procedure: OPEN REDUCTION INTERNAL FIXATION (ORIF) LEFT WRIST FRACTURE;  Surgeon: Meredith Pel, MD;  Location: [place];  Service: Orthopedics;  Laterality: Left;  . TUBAL LIGATION    . UPPER GASTROINTESTINAL ENDOSCOPY  06/26/03   Dr. Wynelle Bourgeois sliding hiatal hernia with 8 mm tongue of gastric type mucosa at distal esophagus bile in the stomach.    There were no vitals filed for this visit.      Subjective Assessment - 06/27/16 1354    Subjective  S: Believe you me I don't do any lifting.    Patient is accompained by: Family member  Sister-in-law: Melanie Bradley   Currently in Pain? No/denies            [facility] OT Assessment - 06/27/16 1354      Assessment   Diagnosis s/p ORIF of left humerus fracture, ORIF left wrist  Precautions   Precautions Shoulder   Type of Shoulder Precautions Per MD: Begin with P/ROM and progress as tolerated.                   OT Treatments/Exercises (OP) - 06/27/16 1359      Exercises   Exercises Shoulder;Elbow;Wrist;Hand     Shoulder Exercises: Supine   Protraction PROM;10 reps;AAROM;12 reps   Horizontal ABduction PROM;10 reps;AAROM;12 reps   External Rotation PROM;10 reps;AAROM;12 reps   Internal Rotation PROM;10 reps;AAROM;12 reps   Flexion PROM;10 reps;AAROM;12 reps   ABduction PROM;10 reps;AAROM;12 reps     Shoulder Exercises: Seated   Protraction AAROM;10 reps   Horizontal ABduction AAROM;10 reps    External Rotation AAROM;10 reps   Internal Rotation AAROM;10 reps   Flexion AAROM;10 reps   Abduction AAROM;10 reps     Shoulder Exercises: ROM/Strengthening   Pendulum 3x5"   Anterior Glide 3x5"     Elbow Exercises   Elbow Extension PROM;AROM;10 reps   Forearm Supination PROM;AROM;10 reps   Forearm Pronation PROM;AROM;10 reps   Wrist Flexion PROM;AROM;10 reps   Wrist Extension PROM;AROM;10 reps   Other elbow exercises elbow flexion, P/ROM and A/ROM, 10X each     Additional Elbow Exercises   Hand Gripper with Large Beads all beads 15#   Hand Gripper with Medium Beads all beads 15#   Hand Gripper with Small Beads all beads 15#     Wrist Exercises   Wrist Radial Deviation PROM;Self ROM;10 reps   Wrist Ulnar Deviation PROM;Self ROM;10 reps     Hand Exercises   Digiticizer 3#, 10X     Manual Therapy   Manual Therapy Myofascial release   Manual therapy comments Completed separately from therapeutic exercise   Myofascial Release Myofascial release to left wrist, volar and dorsal forearm, elbow, upper arm, trapezius, and scapularis regions to decrease pain and fascial restrictions and increase joint range of motion. Scar massage to incisions to improve scar mobility and reduce fascial restrictions.                   OT Short Term Goals - 06/21/16 1615      OT SHORT TERM GOAL #1   Title Pt will be educated on HEP to improve LUE use during daily tasks.    Time 6   Period Weeks   Status On-going     OT SHORT TERM GOAL #2   Title Pt will decrease LUE pain to 5/10 to improve ability to assist with daily task completion.    Time 6   Period Weeks   Status On-going     OT SHORT TERM GOAL #3   Title Pt will decrease LUE fascial restrictions from max to moderate amounts to improve mobility required for functional reaching tasks.    Time 6   Period Weeks   Status On-going     OT SHORT TERM GOAL #4   Title Pt will improve left shoulder P/ROM to [facility] to improve ability  to assist with donning shirts.    Time 6   Period Weeks   Status On-going     OT SHORT TERM GOAL #5   Title Pt will improve left elbow A/ROM to WNL to improve ability to use LUE as assist during meals.    Time 6   Period Weeks   Status On-going     OT SHORT TERM GOAL #6   Title Pt will improve left wrist P/ROM to [facility] to improve ability to assist  with grooming tasks.    Time 6   Period Weeks   Status On-going     OT SHORT TERM GOAL #7   Title Pt will improve LUE strength to 3-/5 to improve ability to use LUE as assist during bathing tasks.    Time 6   Period Weeks   Status On-going     OT SHORT TERM GOAL #8   Title Pt will increase LUE strength by 10# to improve ability to grasp a cup with LUE.    Time 6   Period Weeks   Status On-going     OT SHORT TERM GOAL  #9   TITLE Pt will improve left pinch strength by 2# to improve ability to grasp utensils/tools with LUE.    Time 6   Period Weeks   Status On-going           OT Long Term Goals - 06/09/16 1710      OT LONG TERM GOAL #1   Title Pt will return to prior level of functioning and independence in ADL and leisure tasks use LUE as assist/non-dominant arm.    Time 12   Period Weeks   Status On-going     OT LONG TERM GOAL #2   Title Pt will decrease LUE pain to 3/10 or less to improve ability to use LUE for functional task completion.    Time 12   Period Weeks   Status On-going     OT LONG TERM GOAL #3   Title Pt will decrease LUE fascial restrictions from mod to min amounts or less to improve mobility required for overhead reaching.    Time 12   Period Weeks   Status On-going     OT LONG TERM GOAL #4   Title Pt will improve left shoulder A/ROM to [facility] [employer] to improve ability to wash hair.    Time 12   Period Weeks   Status On-going     OT LONG TERM GOAL #5   Title Pt will improve left wrist A/ROM to [facility] to improve ability to work in flowers.    Time 12   Period Weeks   Status On-going     Long Term  Additional Goals   Additional Long Term Goals Yes     OT LONG TERM GOAL #6   Title Pt will improve LUE strength to 4/5 to improve ability to perform yardwork tasks.    Time 12   Period Weeks   Status On-going     OT LONG TERM GOAL #7   Title Pt will improve left grip strength by 15# to improve ability to grasp plates with left hand.    Time 12   Period Weeks   Status New     OT LONG TERM GOAL #8   Title Pt will improve left pinch strength by 5# to improve ability to grasp cooking supplies with left hand.    Time 12   Period Weeks   Status On-going               Plan - 06/27/16 1623    Clinical Impression Statement A: Added hand gripper exercises as well as caudal and anterior glides this session, pt requiring verbal cuing for technique. Pt able to tolerate P/ROM to 60% with less pain this session. Constant verbal cuing for form and technique due to cognition.    Plan P: continue working to improve range, add pvc slide and resume wall wash    OT  Home Exercise Plan 4/25: shoulder pendulums, elbow flexion/extension, finger/hand A/ROM; 5/9: AA/ROM shoulder   Consulted and Agree with Plan of Care Patient;Family member/caregiver   Family Member Consulted sister-in-law-Melanie Bradley      Patient will benefit from skilled therapeutic intervention in order to improve the following deficits and impairments:  Decreased scar mobility, Decreased knowledge of precautions, Decreased activity tolerance, Decreased strength, Impaired flexibility, Decreased range of motion, Pain, Increased edema, Decreased coordination, Increased fascial restricitons, Impaired UE functional use  Visit Diagnosis: Pain in left wrist  Acute pain of left shoulder  Stiffness of left wrist, not elsewhere classified  Stiffness of left shoulder, not elsewhere classified  Other symptoms and signs involving the musculoskeletal system    Problem List Patient Active Problem List   Diagnosis Date Noted  . Wrist  fracture 06/26/2016  . Humeral fracture 06/26/2016  . Encounter for screening colonoscopy 12/31/2015  . Memory difficulty 06/03/2014  . Headache disorder 06/03/2014  . Atrial flutter ([place]) 05/09/2013  . COPD (chronic obstructive pulmonary disease) ([place]) 05/09/2013  . Tobacco abuse 05/08/2013  . LBBB (left bundle branch block)- intermittent 05/08/2013  . Chronic diarrhea 05/11/2011  . Chronic nausea 05/11/2011  . Dyspepsia 02/24/2011  . IBS (irritable bowel syndrome) 02/24/2011  . Chest pain 12/05/2010  . Hypertension 12/05/2010   Guadelupe Sabin, OTR/L  [phone] 06/27/2016, 4:26 PM  [place] [address] [place], [place], [address] Phone: [phone]   Fax:  [phone]  Name: Melanie Bradley MRN: [id number] Date of Birth: 08/27/1950

## 2016-06-28 ENCOUNTER — Ambulatory Visit (HOSPITAL_COMMUNITY): Payer: BLUE CROSS/BLUE SHIELD | Admitting: Occupational Therapy

## 2016-06-28 ENCOUNTER — Encounter (HOSPITAL_COMMUNITY): Payer: Self-pay | Admitting: Occupational Therapy

## 2016-06-28 DIAGNOSIS — M25532 Pain in left wrist: Secondary | ICD-10-CM | POA: Diagnosis not present

## 2016-06-28 DIAGNOSIS — M25512 Pain in left shoulder: Secondary | ICD-10-CM

## 2016-06-28 DIAGNOSIS — M25612 Stiffness of left shoulder, not elsewhere classified: Secondary | ICD-10-CM

## 2016-06-28 DIAGNOSIS — R29898 Other symptoms and signs involving the musculoskeletal system: Secondary | ICD-10-CM

## 2016-06-28 DIAGNOSIS — M25632 Stiffness of left wrist, not elsewhere classified: Secondary | ICD-10-CM

## 2016-06-28 NOTE — Patient Instructions (Signed)
AROM Exercises: *Complete exercises ___10___ times each, ___2-3____ times per day*  1) Wrist Flexion  Start with wrist at edge of table, palm facing up. With wrist hanging slightly off table, curl wrist upward, and back down.      2) Wrist Extension  Start with wrist at edge of table, palm facing down. With wrist slightly off the edge of the table, curl wrist up and back down.      3) Radial Deviations  Start with forearm flat against a table, wrist hanging slightly off the edge, and palm facing the wall. Bending at the wrist only, and keeping palm facing the wall, bend wrist so fist is pointing towards the floor, back up to start position, and up towards the ceiling. Return to start.        4) WRIST PRONATION  Turn your forearm towards palm face down.  Keep your elbow bent and by the side of your  Body.      5) WRIST SUPINATION  Turn your forearm towards palm face up.  Keep your elbow bent and by the side of your  Body.            

## 2016-06-28 NOTE — Therapy (Signed)
[place], [place], [address] Phone: [phone]   Fax:  [phone]  Occupational Therapy Treatment  Patient Details  Name: Melanie Bradley MRN: [id number] Date of Birth: 01/30/1951 Referring Provider: Dr. Meredith Pel  Encounter Date: 06/28/2016      OT End of Session - 06/28/16 1604    Visit Number 9   Number of Visits 30   Date for OT Re-Evaluation 08/06/16  mini-reassessment 07/05/2016   Authorization Type BCBS   Authorization Time Period 30 visit limit   Authorization - Visit Number 9   Authorization - Number of Visits 30   OT Start Time 1430   OT Stop Time 1600   OT Time Calculation (min) 90 min   Activity Tolerance Patient tolerated treatment well   Behavior During Therapy [facility] for tasks assessed/performed      Past Medical History:  Diagnosis Date  . Atrial fibrillation ([place])   . Bloating   . Complication of anesthesia     " hard to wake up sometimes "  . Dementia   . Diverticula of colon   . Epigastric burning sensation   . GERD (gastroesophageal reflux disease)   . Headache disorder 06/03/2014  . Headache(784.0)   . Helicobacter pylori gastritis 2004  . Hemorrhoids 06/26/03   Dr. Laural Golden tcs  . Hiatal hernia 06/26/03   Dr. Laural Golden egd  . Hypertension   . Hypothyroidism   . 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 Memory difficulty 06/03/2014  . Shoulder fracture, left   . Spastic colon   . Wrist fracture     Past Surgical History:  Procedure Laterality Date  . APPENDECTOMY    . CHOLECYSTECTOMY    . COLONOSCOPY  06/26/03   Dr. Kem Boroughs diverticula at the sigmoid colon with one of the transverse colon, normal terminal ileoscopy, small external hemorrhoids the  . COLONOSCOPY N/A 01/20/2016   Procedure: COLONOSCOPY;  Surgeon: Daneil Dolin, MD;  [facility];  Service: Endoscopy;  Laterality: N/A;  2:30 pm  . ESOPHAGOGASTRODUODENOSCOPY  03/15/2011    Dr. Trevor Iha hiatal hernia, abnormal gastric mucosa of unclear significance. Gastric biopsies negative, no H. pylori.Procedure: ESOPHAGOGASTRODUODENOSCOPY (EGD);  Surgeon: Daneil Dolin, MD;  [facility];  Service: Endoscopy;  Laterality: N/A;  7:30  . ORIF HUMERUS FRACTURE Left 05/29/2016   Procedure: LEFT OPEN REDUCTION INTERNAL FIXATION (ORIF) PROXIMAL HUMERUS FRACTURE;  Surgeon: Meredith Pel, MD;  Location: [place];  Service: Orthopedics;  Laterality: Left;  . ORIF WRIST FRACTURE Left 05/29/2016   Procedure: OPEN REDUCTION INTERNAL FIXATION (ORIF) LEFT WRIST FRACTURE;  Surgeon: Meredith Pel, MD;  Location: [place];  Service: Orthopedics;  Laterality: Left;  . TUBAL LIGATION    . UPPER GASTROINTESTINAL ENDOSCOPY  06/26/03   Dr. Wynelle Bourgeois sliding hiatal hernia with 8 mm tongue of gastric type mucosa at distal esophagus bile in the stomach.    There were no vitals filed for this visit.      Subjective Assessment - 06/28/16 1432    Subjective  S: I think I did too many of those exercises because I was sore.    Currently in Pain? No/denies            [facility] OT Assessment - 06/28/16 1432      Assessment   Diagnosis s/p ORIF of left humerus fracture, ORIF left wrist     Precautions   Precautions Shoulder  Type of Shoulder Precautions Per MD: Begin with P/ROM and progress as tolerated.                   OT Treatments/Exercises (OP) - 06/28/16 1433      Exercises   Exercises Shoulder;Elbow;Wrist;Hand     Shoulder Exercises: Supine   Protraction PROM;5 reps;AAROM;12 reps   Horizontal ABduction PROM;5 reps;AAROM;12 reps   External Rotation PROM;5 reps;AAROM;12 reps   Internal Rotation PROM;5 reps;AAROM;12 reps   Flexion PROM;5 reps;AAROM;12 reps   ABduction PROM;5 reps;AAROM;12 reps     Shoulder Exercises: Standing   Other Standing Exercises pvc pipe slide, 10X     Shoulder Exercises: Pulleys   Flexion 1 minute   ABduction 1  minute     Shoulder Exercises: Therapy Ball   Right/Left 5 reps  each direction     Shoulder Exercises: ROM/Strengthening   Wall Wash 1'     Elbow Exercises   Elbow Extension PROM;5 reps;AROM;10 reps   Forearm Supination PROM;5 reps;AROM;10 reps   Forearm Pronation PROM;5 reps;AROM;10 reps   Wrist Flexion PROM;5 reps;AROM;10 reps   Wrist Extension PROM;5 reps;AROM;10 reps   Other elbow exercises elbow flexion, P/ROM 5X, A/ROM 10X     Additional Elbow Exercises   Theraputty Flatten;Roll;Grip;Pinch   Theraputty - Flatten yellow   Theraputty - Roll yellow   Theraputty - Grip yellow-pronated      Weighted Stretch Over Towel Roll   Supination - Weighted Stretch 1 pound;30 seconds  2 reps     Wrist Exercises   Wrist Radial Deviation PROM;5 reps;AROM;10 reps   Wrist Ulnar Deviation PROM;5 reps;AROM;10 reps   Other wrist exercises velcro board-supination/pronation, wrist flexion/extension, 2 reps each along narrow velcro strip     Additional Wrist Exercises   Theraputty - Pinch yellow- lateral and 3 point     Modalities   Modalities Ultrasound     Ultrasound   Ultrasound Location scar along anterior shoulder and deltoid   Ultrasound Parameters 3.0 mhz/1.5 w/cm2   Ultrasound Goals Pain;Other (Comment)  scar tissue     Manual Therapy   Manual Therapy Myofascial release   Manual therapy comments Completed separately from therapeutic exercise   Myofascial Release Myofascial release to left wrist, volar and dorsal forearm, elbow, upper arm, trapezius, and scapularis regions to decrease pain and fascial restrictions and increase joint range of motion. Scar massage to incisions to improve scar mobility and reduce fascial restrictions.                 OT Education - 06/28/16 1548    Education provided Yes   Education Details wrist A/ROM exercises   Person(s) Educated Patient   Methods Explanation;Demonstration;Handout   Comprehension Verbalized understanding;Returned  demonstration          OT Short Term Goals - 06/21/16 1615      OT SHORT TERM GOAL #1   Title Pt will be educated on HEP to improve LUE use during daily tasks.    Time 6   Period Weeks   Status On-going     OT SHORT TERM GOAL #2   Title Pt will decrease LUE pain to 5/10 to improve ability to assist with daily task completion.    Time 6   Period Weeks   Status On-going     OT SHORT TERM GOAL #3   Title Pt will decrease LUE fascial restrictions from max to moderate amounts to improve mobility required for functional reaching tasks.    Time  6   Period Weeks   Status On-going     OT SHORT TERM GOAL #4   Title Pt will improve left shoulder P/ROM to [facility] to improve ability to assist with donning shirts.    Time 6   Period Weeks   Status On-going     OT SHORT TERM GOAL #5   Title Pt will improve left elbow A/ROM to WNL to improve ability to use LUE as assist during meals.    Time 6   Period Weeks   Status On-going     OT SHORT TERM GOAL #6   Title Pt will improve left wrist P/ROM to [facility] to improve ability to assist with grooming tasks.    Time 6   Period Weeks   Status On-going     OT SHORT TERM GOAL #7   Title Pt will improve LUE strength to 3-/5 to improve ability to use LUE as assist during bathing tasks.    Time 6   Period Weeks   Status On-going     OT SHORT TERM GOAL #8   Title Pt will increase LUE strength by 10# to improve ability to grasp a cup with LUE.    Time 6   Period Weeks   Status On-going     OT SHORT TERM GOAL  #9   TITLE Pt will improve left pinch strength by 2# to improve ability to grasp utensils/tools with LUE.    Time 6   Period Weeks   Status On-going           OT Long Term Goals - 06/09/16 1710      OT LONG TERM GOAL #1   Title Pt will return to prior level of functioning and independence in ADL and leisure tasks use LUE as assist/non-dominant arm.    Time 12   Period Weeks   Status On-going     OT LONG TERM GOAL #2   Title  Pt will decrease LUE pain to 3/10 or less to improve ability to use LUE for functional task completion.    Time 12   Period Weeks   Status On-going     OT LONG TERM GOAL #3   Title Pt will decrease LUE fascial restrictions from mod to min amounts or less to improve mobility required for overhead reaching.    Time 12   Period Weeks   Status On-going     OT LONG TERM GOAL #4   Title Pt will improve left shoulder A/ROM to [facility] to improve ability to wash hair.    Time 12   Period Weeks   Status On-going     OT LONG TERM GOAL #5   Title Pt will improve left wrist A/ROM to [facility] to improve ability to work in flowers.    Time 12   Period Weeks   Status On-going     Long Term Additional Goals   Additional Long Term Goals Yes     OT LONG TERM GOAL #6   Title Pt will improve LUE strength to 4/5 to improve ability to perform yardwork tasks.    Time 12   Period Weeks   Status On-going     OT LONG TERM GOAL #7   Title Pt will improve left grip strength by 15# to improve ability to grasp plates with left hand.    Time 12   Period Weeks   Status New     OT LONG TERM GOAL #8   Title  Pt will improve left pinch strength by 5# to improve ability to grasp cooking supplies with left hand.    Time 12   Period Weeks   Status New               Plan - 06/28/16 1605    Clinical Impression Statement A: Added [place] along scar at anterior shoulder to decrease pain and help break up scar tissue. Added pvc pipe slide, velcro board, ulnar/radial deviation A/ROM, and supination weighted stretch this session. Pt with improved form during AA/ROM supine, OT notes improvement in range with passive stretching after cross friction scar massage and ultrasound.    Plan P: Continue with cross friction massage to shoulder scar and ultrasound for improved ROM. Add weighted wrist extension and flexion stretches   OT Home Exercise Plan 4/25: shoulder pendulums, elbow flexion/extension, finger/hand A/ROM; 5/9:  AA/ROM shoulder; 5/16: wrist A/ROM   Consulted and Agree with Plan of Care Patient;Family member/caregiver   Family Member Consulted Sister-Alma      Patient will benefit from skilled therapeutic intervention in order to improve the following deficits and impairments:  Decreased scar mobility, Decreased knowledge of precautions, Decreased activity tolerance, Decreased strength, Impaired flexibility, Decreased range of motion, Pain, Increased edema, Decreased coordination, Increased fascial restricitons, Impaired UE functional use  Visit Diagnosis: Pain in left wrist  Acute pain of left shoulder  Stiffness of left wrist, not elsewhere classified  Stiffness of left shoulder, not elsewhere classified  Other symptoms and signs involving the musculoskeletal system    Problem List Patient Active Problem List   Diagnosis Date Noted  . Wrist fracture 06/26/2016  . Humeral fracture 06/26/2016  . Encounter for screening colonoscopy 12/31/2015  . Memory difficulty 06/03/2014  . Headache disorder 06/03/2014  . Atrial flutter ([place]) 05/09/2013  . COPD (chronic obstructive pulmonary disease) ([place]) 05/09/2013  . Tobacco abuse 05/08/2013  . LBBB (left bundle branch block)- intermittent 05/08/2013  . Chronic diarrhea 05/11/2011  . Chronic nausea 05/11/2011  . Dyspepsia 02/24/2011  . IBS (irritable bowel syndrome) 02/24/2011  . Chest pain 12/05/2010  . Hypertension 12/05/2010   Guadelupe Sabin, OTR/L  [phone] 06/28/2016, 4:10 PM  [place] [address] [place], [place], [address] Phone: [phone]   Fax:  [phone]  Name: Melanie Bradley MRN: [id number] Date of Birth: 1951/02/06

## 2016-06-29 NOTE — Progress Notes (Signed)
Post-Op Visit Note   Patient: Melanie Bradley           Date of Birth: 1951-01-19           MRN: [id number] Visit Date: 06/26/2016 PCP: Melanie Sites, MD   Assessment & Plan:  Chief Complaint:  Chief Complaint  Patient presents with  . Left Shoulder - Routine Post Op  . Left Wrist - Routine Post Op   Visit Diagnoses:  1. Closed fracture of left wrist with routine healing, subsequent encounter   2. Other closed displaced fracture of proximal end of left humerus with routine healing, subsequent encounter     Plan: Melanie Bradley is a 66 year old patient is now about a month out from left humerus and left wrist open reduction internal fixation.  She's been doing well.  She's been doing her own physical therapy.  She is not using the sling or splint.  She is also going to physical therapy 3 times a week plus a home exercise program.  Not taking any medications for pain.  On exam patient has good wrist flexion but only about 20 of wrist extension.  She is also lacking about 50 of full supination.  Grip strength is improving radial pulses intact incision is intact.  In the shoulder region she has improving range of motion and strength.  No course grinding or crepitus with internal/external rotation of the arm.  Plan at this time is for 4 week return to want her doing any lifting with that left arm or wrist.  Continue with his therapy at home exercise program.  I'll see her back with repeat radiographs in 4 weeks likely release at that time  Follow-Up Instructions: No Follow-up on file.   Orders:  Orders Placed This Encounter  Procedures  . XR Humerus Left  . XR Wrist Complete Left   No orders of the defined types were placed in this encounter.   Imaging: No results found.  PMFS History: Patient Active Problem List   Diagnosis Date Noted  . Wrist fracture 06/26/2016  . Humeral fracture 06/26/2016  . Encounter for screening colonoscopy 12/31/2015  . Memory difficulty 06/03/2014    . Headache disorder 06/03/2014  . Atrial flutter ([place]) 05/09/2013  . COPD (chronic obstructive pulmonary disease) ([place]) 05/09/2013  . Tobacco abuse 05/08/2013  . LBBB (left bundle branch block)- intermittent 05/08/2013  . Chronic diarrhea 05/11/2011  . Chronic nausea 05/11/2011  . Dyspepsia 02/24/2011  . IBS (irritable bowel syndrome) 02/24/2011  . Chest pain 12/05/2010  . Hypertension 12/05/2010   Past Medical History:  Diagnosis Date  . Atrial fibrillation ([place])   . Bloating   . Complication of anesthesia     " hard to wake up sometimes "  . Dementia   . Diverticula of colon   . Epigastric burning sensation   . GERD (gastroesophageal reflux disease)   . Headache disorder 06/03/2014  . Headache(784.0)   . Helicobacter pylori gastritis 2004  . Hemorrhoids 06/26/03   Dr. Laural Golden tcs  . Hiatal hernia 06/26/03   Dr. Laural Golden egd  . Hypertension   . Hypothyroidism   . 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 Memory difficulty 06/03/2014  . Shoulder fracture, left   . Spastic colon   . Wrist fracture     Family History  Problem Relation Age of Onset  . Hypertension Mother   . Angina Mother   . COPD Father   . Heart failure Father   .  Cancer Sister   . Dementia Sister   . Cardiomyopathy Brother   . Pneumonia Other   . Migraines Sister   . Colon cancer Neg Hx     Past Surgical History:  Procedure Laterality Date  . APPENDECTOMY    . CHOLECYSTECTOMY    . COLONOSCOPY  06/26/03   Dr. Kem Boroughs diverticula at the sigmoid colon with one of the transverse colon, normal terminal ileoscopy, small external hemorrhoids the  . COLONOSCOPY N/A 01/20/2016   Procedure: COLONOSCOPY;  Surgeon: Daneil Dolin, MD;  [facility];  Service: Endoscopy;  Laterality: N/A;  2:30 pm  . ESOPHAGOGASTRODUODENOSCOPY  03/15/2011   Dr. Trevor Iha hiatal hernia, abnormal gastric mucosa of unclear significance. Gastric biopsies negative, no H. pylori.Procedure:  ESOPHAGOGASTRODUODENOSCOPY (EGD);  Surgeon: Daneil Dolin, MD;  [facility];  Service: Endoscopy;  Laterality: N/A;  7:30  . ORIF HUMERUS FRACTURE Left 05/29/2016   Procedure: LEFT OPEN REDUCTION INTERNAL FIXATION (ORIF) PROXIMAL HUMERUS FRACTURE;  Surgeon: Meredith Pel, MD;  Location: [place];  Service: Orthopedics;  Laterality: Left;  . ORIF WRIST FRACTURE Left 05/29/2016   Procedure: OPEN REDUCTION INTERNAL FIXATION (ORIF) LEFT WRIST FRACTURE;  Surgeon: Meredith Pel, MD;  Location: [place];  Service: Orthopedics;  Laterality: Left;  . TUBAL LIGATION    . UPPER GASTROINTESTINAL ENDOSCOPY  06/26/03   Dr. Wynelle Bourgeois sliding hiatal hernia with 8 mm tongue of gastric type mucosa at distal esophagus bile in the stomach.   Social History   Occupational History  .     Social History Main Topics  . Smoking status: Current Every Day Smoker    Packs/day: 1.50    Years: 45.00    Types: Cigarettes    Start date: 09/10/1966  . Smokeless tobacco: Never Used  . Alcohol use No  . Drug use: No  . Sexual activity: Yes

## 2016-06-30 ENCOUNTER — Ambulatory Visit (HOSPITAL_COMMUNITY): Payer: BLUE CROSS/BLUE SHIELD | Admitting: Occupational Therapy

## 2016-06-30 DIAGNOSIS — M25512 Pain in left shoulder: Secondary | ICD-10-CM

## 2016-06-30 DIAGNOSIS — M25532 Pain in left wrist: Secondary | ICD-10-CM

## 2016-06-30 DIAGNOSIS — R29898 Other symptoms and signs involving the musculoskeletal system: Secondary | ICD-10-CM

## 2016-06-30 DIAGNOSIS — M25632 Stiffness of left wrist, not elsewhere classified: Secondary | ICD-10-CM

## 2016-06-30 DIAGNOSIS — M25612 Stiffness of left shoulder, not elsewhere classified: Secondary | ICD-10-CM

## 2016-06-30 NOTE — Therapy (Signed)
[place], [place], [address] Phone: [phone]   Fax:  [phone]  Occupational Therapy Treatment  Patient Details  Name: Melanie Bradley MRN: [id number] Date of Birth: 18-Jan-1951 Referring Provider: Dr. Meredith Pel  Encounter Date: 06/30/2016      OT End of Session - 06/30/16 1606    Visit Number 10   Number of Visits 30   Date for OT Re-Evaluation 08/06/16  mini-reassessment 07/05/2016   Authorization Type [employer]   Authorization Time Period 30 visit limit   Authorization - Visit Number 10   Authorization - Number of Visits 30   OT Start Time 1435   OT Stop Time 1600   OT Time Calculation (min) 85 min   Activity Tolerance Patient tolerated treatment well   Behavior During Therapy [facility] for tasks assessed/performed      Past Medical History:  Diagnosis Date  . Atrial fibrillation ([place])   . Bloating   . Complication of anesthesia     " hard to wake up sometimes "  . Dementia   . Diverticula of colon   . Epigastric burning sensation   . GERD (gastroesophageal reflux disease)   . Headache disorder 06/03/2014  . Headache(784.0)   . Helicobacter pylori gastritis 2004  . Hemorrhoids 06/26/03   Dr. Laural Golden tcs  . Hiatal hernia 06/26/03   Dr. Laural Golden egd  . Hypertension   . Hypothyroidism   . 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 Memory difficulty 06/03/2014  . Shoulder fracture, left   . Spastic colon   . Wrist fracture     Past Surgical History:  Procedure Laterality Date  . APPENDECTOMY    . CHOLECYSTECTOMY    . COLONOSCOPY  06/26/03   Dr. Kem Boroughs diverticula at the sigmoid colon with one of the transverse colon, normal terminal ileoscopy, small external hemorrhoids the  . COLONOSCOPY N/A 01/20/2016   Procedure: COLONOSCOPY;  Surgeon: Daneil Dolin, MD;  [facility];  Service: Endoscopy;  Laterality: N/A;  2:30 pm  . ESOPHAGOGASTRODUODENOSCOPY   03/15/2011   Dr. Trevor Iha hiatal hernia, abnormal gastric mucosa of unclear significance. Gastric biopsies negative, no H. pylori.Procedure: ESOPHAGOGASTRODUODENOSCOPY (EGD);  Surgeon: Daneil Dolin, MD;  [facility];  Service: Endoscopy;  Laterality: N/A;  7:30  . ORIF HUMERUS FRACTURE Left 05/29/2016   Procedure: LEFT OPEN REDUCTION INTERNAL FIXATION (ORIF) PROXIMAL HUMERUS FRACTURE;  Surgeon: Meredith Pel, MD;  Location: [place];  Service: Orthopedics;  Laterality: Left;  . ORIF WRIST FRACTURE Left 05/29/2016   Procedure: OPEN REDUCTION INTERNAL FIXATION (ORIF) LEFT WRIST FRACTURE;  Surgeon: Meredith Pel, MD;  Location: [place];  Service: Orthopedics;  Laterality: Left;  . TUBAL LIGATION    . UPPER GASTROINTESTINAL ENDOSCOPY  06/26/03   Dr. Wynelle Bourgeois sliding hiatal hernia with 8 mm tongue of gastric type mucosa at distal esophagus bile in the stomach.    There were no vitals filed for this visit.      Subjective Assessment - 06/30/16 1437    Subjective  S: I've been exercising.    Currently in Pain? No/denies                      OT Treatments/Exercises (OP) - 06/30/16 1523      Exercises   Exercises Shoulder;Elbow;Wrist;Hand     Shoulder Exercises: Supine   Protraction PROM;5 reps;AAROM;12 reps   Horizontal ABduction  PROM;5 reps;AAROM;12 reps   External Rotation PROM;5 reps;AAROM;12 reps   Internal Rotation PROM;5 reps;AAROM;12 reps   Flexion PROM;5 reps;AAROM;12 reps   ABduction PROM;5 reps;AAROM;12 reps     Shoulder Exercises: Therapy Ball   Right/Left 5 reps  each direction     Shoulder Exercises: ROM/Strengthening   Wall Wash 1'     Elbow Exercises   Elbow Extension PROM;5 reps;AROM  12 reps   Forearm Supination PROM;5 reps;AROM  12 reps   Forearm Pronation PROM;5 reps;AROM  12 reps   Wrist Flexion PROM;5 reps;AROM  12 reps   Wrist Extension PROM;5 reps;AROM  12 reps     Additional Elbow Exercises   Theraputty  Flatten   Theraputty - Flatten yellow-standing, working on wrist extension   Hand Gripper with Large Beads all beads 22#   Hand Gripper with Medium Beads all beads 20#     Weighted Stretch Over Towel Roll   Supination - Weighted Stretch 1 pound;30 seconds  2 reps   Wrist Flexion - Weighted Stretch 1 pound;30 seconds  2 reps   Wrist Extension - Weighted Stretch 1 pound;30 seconds  2 reps     Wrist Exercises   Wrist Radial Deviation PROM;5 reps;AROM  12 reps   Wrist Ulnar Deviation PROM;5 reps;AROM  12 reps   Other wrist exercises velcro board-supination/pronation, wrist flexion/extension, 5 reps each along narrow velcro strip     Modalities   Modalities Ultrasound     Ultrasound   Ultrasound Location scar along anterior shoulder and deltoid   Ultrasound Parameters 3.0 mhz 1.5 w/cm2   Ultrasound Goals Pain;Other (Comment)  scar tissue     Manual Therapy   Manual Therapy Myofascial release;Muscle Energy Technique   Manual therapy comments Completed separately from therapeutic exercise   Myofascial Release Myofascial release to left wrist, volar and dorsal forearm, elbow, upper arm, trapezius, and scapularis regions to decrease pain and fascial restrictions and increase joint range of motion. Scar massage to incisions to improve scar mobility and reduce fascial restrictions.    Muscle Energy Technique Muscle energy technqiue performed to decrease muscle spasm and increase range of motion                  OT Short Term Goals - 06/21/16 1615      OT SHORT TERM GOAL #1   Title Pt will be educated on HEP to improve LUE use during daily tasks.    Time 6   Period Weeks   Status On-going     OT SHORT TERM GOAL #2   Title Pt will decrease LUE pain to 5/10 to improve ability to assist with daily task completion.    Time 6   Period Weeks   Status On-going     OT SHORT TERM GOAL #3   Title Pt will decrease LUE fascial restrictions from max to moderate amounts to improve  mobility required for functional reaching tasks.    Time 6   Period Weeks   Status On-going     OT SHORT TERM GOAL #4   Title Pt will improve left shoulder P/ROM to [facility] to improve ability to assist with donning shirts.    Time 6   Period Weeks   Status On-going     OT SHORT TERM GOAL #5   Title Pt will improve left elbow A/ROM to WNL to improve ability to use LUE as assist during meals.    Time 6   Period Weeks   Status On-going  OT SHORT TERM GOAL #6   Title Pt will improve left wrist P/ROM to [facility] to improve ability to assist with grooming tasks.    Time 6   Period Weeks   Status On-going     OT SHORT TERM GOAL #7   Title Pt will improve LUE strength to 3-/5 to improve ability to use LUE as assist during bathing tasks.    Time 6   Period Weeks   Status On-going     OT SHORT TERM GOAL #8   Title Pt will increase LUE strength by 10# to improve ability to grasp a cup with LUE.    Time 6   Period Weeks   Status On-going     OT SHORT TERM GOAL  #9   TITLE Pt will improve left pinch strength by 2# to improve ability to grasp utensils/tools with LUE.    Time 6   Period Weeks   Status On-going           OT Long Term Goals - 06/09/16 1710      OT LONG TERM GOAL #1   Title Pt will return to prior level of functioning and independence in ADL and leisure tasks use LUE as assist/non-dominant arm.    Time 12   Period Weeks   Status On-going     OT LONG TERM GOAL #2   Title Pt will decrease LUE pain to 3/10 or less to improve ability to use LUE for functional task completion.    Time 12   Period Weeks   Status On-going     OT LONG TERM GOAL #3   Title Pt will decrease LUE fascial restrictions from mod to min amounts or less to improve mobility required for overhead reaching.    Time 12   Period Weeks   Status On-going     OT LONG TERM GOAL #4   Title Pt will improve left shoulder A/ROM to [facility] to improve ability to wash hair.    Time 12   Period Weeks    Status On-going     OT LONG TERM GOAL #5   Title Pt will improve left wrist A/ROM to [facility] to improve ability to work in flowers.    Time 12   Period Weeks   Status On-going     Long Term Additional Goals   Additional Long Term Goals Yes     OT LONG TERM GOAL #6   Title Pt will improve LUE strength to 4/5 to improve ability to perform yardwork tasks.    Time 12   Period Weeks   Status On-going     OT LONG TERM GOAL #7   Title Pt will improve left grip strength by 15# to improve ability to grasp plates with left hand.    Time 12   Period Weeks   Status New     OT LONG TERM GOAL #8   Title Pt will improve left pinch strength by 5# to improve ability to grasp cooking supplies with left hand.    Time 12   Period Weeks   Status New               Plan - 06/30/16 1608    Clinical Impression Statement A: Continued with [place] today, pt much less tender along scar at anterior shoulder. Pt with improved P/ROM this session, added weighted wrist extension and flexion stretching, increased velcro board repetitions. Pt requires consistent cuing for form and technqiue due to cognition.  Plan P: continue with cross friction massage to shoulder scar and [place] for improved ROM and decreased pain, resume pvc pipe slide and pulleys. Complete AA/ROM in sitting, not supine next session.    OT Home Exercise Plan 4/25: shoulder pendulums, elbow flexion/extension, finger/hand A/ROM; 5/9: AA/ROM shoulder; 5/16: wrist A/ROM   Consulted and Agree with Plan of Care Patient;Family member/caregiver   Family Member [employer]      Patient will benefit from skilled therapeutic intervention in order to improve the following deficits and impairments:  Decreased scar mobility, Decreased knowledge of precautions, Decreased activity tolerance, Decreased strength, Impaired flexibility, Decreased range of motion, Pain, Increased edema, Decreased coordination, Increased fascial restricitons, Impaired UE  functional use  Visit Diagnosis: Pain in left wrist  Acute pain of left shoulder  Stiffness of left wrist, not elsewhere classified  Stiffness of left shoulder, not elsewhere classified  Other symptoms and signs involving the musculoskeletal system    Problem List Patient Active Problem List   Diagnosis Date Noted  . Wrist fracture 06/26/2016  . Humeral fracture 06/26/2016  . Encounter for screening colonoscopy 12/31/2015  . Memory difficulty 06/03/2014  . Headache disorder 06/03/2014  . Atrial flutter ([place]) 05/09/2013  . COPD (chronic obstructive pulmonary disease) ([place]) 05/09/2013  . Tobacco abuse 05/08/2013  . LBBB (left bundle branch block)- intermittent 05/08/2013  . Chronic diarrhea 05/11/2011  . Chronic nausea 05/11/2011  . Dyspepsia 02/24/2011  . IBS (irritable bowel syndrome) 02/24/2011  . Chest pain 12/05/2010  . Hypertension 12/05/2010   Guadelupe Sabin, OTR/L  [phone] 06/30/2016, 4:11 PM  [place] [address] [place], [place], [address] Phone: [phone]   Fax:  [phone]  Name: Melanie Bradley MRN: [id number] Date of Birth: June 11, 1950

## 2016-07-04 ENCOUNTER — Ambulatory Visit (HOSPITAL_COMMUNITY): Payer: BLUE CROSS/BLUE SHIELD | Admitting: Occupational Therapy

## 2016-07-04 ENCOUNTER — Encounter (HOSPITAL_COMMUNITY): Payer: Self-pay | Admitting: Occupational Therapy

## 2016-07-04 ENCOUNTER — Other Ambulatory Visit: Payer: Self-pay | Admitting: Adult Health

## 2016-07-04 DIAGNOSIS — R29898 Other symptoms and signs involving the musculoskeletal system: Secondary | ICD-10-CM

## 2016-07-04 DIAGNOSIS — M25532 Pain in left wrist: Secondary | ICD-10-CM

## 2016-07-04 DIAGNOSIS — M25632 Stiffness of left wrist, not elsewhere classified: Secondary | ICD-10-CM

## 2016-07-04 DIAGNOSIS — M25612 Stiffness of left shoulder, not elsewhere classified: Secondary | ICD-10-CM

## 2016-07-04 DIAGNOSIS — M25512 Pain in left shoulder: Secondary | ICD-10-CM

## 2016-07-04 NOTE — Therapy (Signed)
[place], [place], [address] Phone: [phone]   Fax:  [phone]  Occupational Therapy Treatment  Patient Details  Name: Melanie Bradley MRN: [id number] Date of Birth: 1950/11/28 Referring Provider: Dr. Meredith Pel  Encounter Date: 07/04/2016      OT End of Session - 07/04/16 1613    Visit Number 11   Number of Visits 30   Date for OT Re-Evaluation 08/06/16  mini-reassessment 07/05/2016   Authorization Type [employer]   Authorization Time Period 30 visit limit   Authorization - Visit Number 11   Authorization - Number of Visits 30   OT Start Time 7253   OT Stop Time 1428   OT Time Calculation (min) 85 min   Activity Tolerance Patient tolerated treatment well   Behavior During Therapy [facility] for tasks assessed/performed      Past Medical History:  Diagnosis Date  . Atrial fibrillation ([place])   . Bloating   . Complication of anesthesia     " hard to wake up sometimes "  . Dementia   . Diverticula of colon   . Epigastric burning sensation   . GERD (gastroesophageal reflux disease)   . Headache disorder 06/03/2014  . Headache(784.0)   . Helicobacter pylori gastritis 2004  . Hemorrhoids 06/26/03   Dr. Laural Golden tcs  . Hiatal hernia 06/26/03   Dr. Laural Golden egd  . Hypertension   . Hypothyroidism   . 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 Memory difficulty 06/03/2014  . Shoulder fracture, left   . Spastic colon   . Wrist fracture     Past Surgical History:  Procedure Laterality Date  . APPENDECTOMY    . CHOLECYSTECTOMY    . COLONOSCOPY  06/26/03   Dr. Kem Boroughs diverticula at the sigmoid colon with one of the transverse colon, normal terminal ileoscopy, small external hemorrhoids the  . COLONOSCOPY N/A 01/20/2016   Procedure: COLONOSCOPY;  Surgeon: Daneil Dolin, MD;  [facility];  Service: Endoscopy;  Laterality: N/A;  2:30 pm  . ESOPHAGOGASTRODUODENOSCOPY   03/15/2011   Dr. Trevor Iha hiatal hernia, abnormal gastric mucosa of unclear significance. Gastric biopsies negative, no H. pylori.Procedure: ESOPHAGOGASTRODUODENOSCOPY (EGD);  Surgeon: Daneil Dolin, MD;  [facility];  Service: Endoscopy;  Laterality: N/A;  7:30  . ORIF HUMERUS FRACTURE Left 05/29/2016   Procedure: LEFT OPEN REDUCTION INTERNAL FIXATION (ORIF) PROXIMAL HUMERUS FRACTURE;  Surgeon: Meredith Pel, MD;  Location: [place];  Service: Orthopedics;  Laterality: Left;  . ORIF WRIST FRACTURE Left 05/29/2016   Procedure: OPEN REDUCTION INTERNAL FIXATION (ORIF) LEFT WRIST FRACTURE;  Surgeon: Meredith Pel, MD;  Location: [place];  Service: Orthopedics;  Laterality: Left;  . TUBAL LIGATION    . UPPER GASTROINTESTINAL ENDOSCOPY  06/26/03   Dr. Wynelle Bourgeois sliding hiatal hernia with 8 mm tongue of gastric type mucosa at distal esophagus bile in the stomach.    There were no vitals filed for this visit.      Subjective Assessment - 07/04/16 1306    Subjective  S: It gets tight sometimes about 12 or 12:30.    Currently in Pain? No/denies            [facility] OT Assessment - 07/04/16 1305      Assessment   Diagnosis s/p ORIF of left humerus fracture, ORIF left wrist     Precautions   Precautions Shoulder   Type of Shoulder  Precautions Per MD: Begin with P/ROM and progress as tolerated.                   OT Treatments/Exercises (OP) - 07/04/16 1306      Exercises   Exercises Shoulder;Elbow;Wrist;Hand     Shoulder Exercises: Supine   Protraction PROM;5 reps   Horizontal ABduction PROM;5 reps   External Rotation PROM;5 reps   Internal Rotation PROM;5 reps   Flexion PROM;5 reps   ABduction PROM;5 reps     Shoulder Exercises: Seated   Protraction AAROM;15 reps   Horizontal ABduction AAROM;15 reps   External Rotation AAROM;15 reps   Internal Rotation AAROM;15 reps   Flexion AAROM;15 reps   Abduction AAROM;15 reps     Shoulder Exercises:  Standing   Other Standing Exercises pvc pipe slide, 10X     Shoulder Exercises: ROM/Strengthening   Wall Wash 1'   Thumb Tacks 1'   Proximal Shoulder Strengthening, Seated 10X each no rest breaks  visual cuing for technique     Elbow Exercises   Forearm Supination PROM;5 reps;AROM  12 reps   Forearm Pronation PROM;5 reps;AROM  12 reps   Wrist Flexion PROM;5 reps;AROM  12 reps   Wrist Extension PROM;5 reps;AROM  12 reps     Additional Elbow Exercises   Theraputty Flatten   Theraputty - Flatten yellow-standing, working on wrist extension   Hand Gripper with Large Beads all beads 22#   Hand Gripper with Medium Beads all beads 20#   Hand Gripper with Small Beads 11/15 beads 20#     Weighted Stretch Over Towel Roll   Supination - Weighted Stretch 1 pound;60 seconds   Wrist Flexion - Weighted Stretch 1 pound;60 seconds   Wrist Extension - Weighted Stretch 1 pound;60 seconds     Wrist Exercises   Wrist Radial Deviation PROM;5 reps;AROM  12 reps   Wrist Ulnar Deviation PROM;5 reps;AROM  12 reps   Other wrist exercises velcro board-supination/pronation, 5 reps each along wide velcro strip     Functional Reaching Activities   Mid Level Pt placed clothespins along vertical pole, focusing on shoulder flexion and pinch strengthening. Pt placed 20 clothespins.      Modalities   Modalities Ultrasound     Ultrasound   Ultrasound Location scar along anterior shoulder and deltoid   Ultrasound Parameters 3.19mhz 1.5w/cm2 pulsed   Ultrasound Goals Pain  scar tissue     Manual Therapy   Manual Therapy Myofascial release;Muscle Energy Technique   Manual therapy comments Completed separately from therapeutic exercise   Myofascial Release Myofascial release to left wrist, volar and dorsal forearm, elbow, upper arm, trapezius, and scapularis regions to decrease pain and fascial restrictions and increase joint range of motion. Scar massage to incisions to improve scar mobility and reduce  fascial restrictions.    Muscle Energy Technique Muscle energy technqiue performed to decrease muscle spasm and increase range of motion                  OT Short Term Goals - 06/21/16 1615      OT SHORT TERM GOAL #1   Title Pt will be educated on HEP to improve LUE use during daily tasks.    Time 6   Period Weeks   Status On-going     OT SHORT TERM GOAL #2   Title Pt will decrease LUE pain to 5/10 to improve ability to assist with daily task completion.    Time 6   Period  Weeks   Status On-going     OT SHORT TERM GOAL #3   Title Pt will decrease LUE fascial restrictions from max to moderate amounts to improve mobility required for functional reaching tasks.    Time 6   Period Weeks   Status On-going     OT SHORT TERM GOAL #4   Title Pt will improve left shoulder P/ROM to [facility] to improve ability to assist with donning shirts.    Time 6   Period Weeks   Status On-going     OT SHORT TERM GOAL #5   Title Pt will improve left elbow A/ROM to WNL to improve ability to use LUE as assist during meals.    Time 6   Period Weeks   Status On-going     OT SHORT TERM GOAL #6   Title Pt will improve left wrist P/ROM to [facility] to improve ability to assist with grooming tasks.    Time 6   Period Weeks   Status On-going     OT SHORT TERM GOAL #7   Title Pt will improve LUE strength to 3-/5 to improve ability to use LUE as assist during bathing tasks.    Time 6   Period Weeks   Status On-going     OT SHORT TERM GOAL #8   Title Pt will increase LUE strength by 10# to improve ability to grasp a cup with LUE.    Time 6   Period Weeks   Status On-going     OT SHORT TERM GOAL  #9   TITLE Pt will improve left pinch strength by 2# to improve ability to grasp utensils/tools with LUE.    Time 6   Period Weeks   Status On-going           OT Long Term Goals - 06/09/16 1710      OT LONG TERM GOAL #1   Title Pt will return to prior level of functioning and independence in  ADL and leisure tasks use LUE as assist/non-dominant arm.    Time 12   Period Weeks   Status On-going     OT LONG TERM GOAL #2   Title Pt will decrease LUE pain to 3/10 or less to improve ability to use LUE for functional task completion.    Time 12   Period Weeks   Status On-going     OT LONG TERM GOAL #3   Title Pt will decrease LUE fascial restrictions from mod to min amounts or less to improve mobility required for overhead reaching.    Time 12   Period Weeks   Status On-going     OT LONG TERM GOAL #4   Title Pt will improve left shoulder A/ROM to [facility] to improve ability to wash hair.    Time 12   Period Weeks   Status On-going     OT LONG TERM GOAL #5   Title Pt will improve left wrist A/ROM to [facility] to improve ability to work in flowers.    Time 12   Period Weeks   Status On-going     Long Term Additional Goals   Additional Long Term Goals Yes     OT LONG TERM GOAL #6   Title Pt will improve LUE strength to 4/5 to improve ability to perform yardwork tasks.    Time 12   Period Weeks   Status On-going     OT LONG TERM GOAL #7   Title Pt will  improve left grip strength by 15# to improve ability to grasp plates with left hand.    Time 12   Period Weeks   Status New     OT LONG TERM GOAL #8   Title Pt will improve left pinch strength by 5# to improve ability to grasp cooking supplies with left hand.    Time 12   Period Weeks   Status New               Plan - 07/04/16 1613    Clinical Impression Statement A: Continued with [place] today, pt with improved ROM when manual stretching completed immediately after [place]. Resumed pvc pipe slide, AA/ROM completed in sitting, increased velcro board to wide strip for increased resistance, emphasizing full supination and pronation with activity. Pt requires cuing for form and technique during exercises.    Plan P: Mini-reassessment, continue with [place] and cross friction massage to shoulder scar, attempt A/ROM and shoulder  stretches   OT Home Exercise Plan 4/25: shoulder pendulums, elbow flexion/extension, finger/hand A/ROM; 5/9: AA/ROM shoulder; 5/16: wrist A/ROM   Consulted and Agree with Plan of Care Patient      Patient will benefit from skilled therapeutic intervention in order to improve the following deficits and impairments:  Decreased scar mobility, Decreased knowledge of precautions, Decreased activity tolerance, Decreased strength, Impaired flexibility, Decreased range of motion, Pain, Increased edema, Decreased coordination, Increased fascial restricitons, Impaired UE functional use  Visit Diagnosis: Pain in left wrist  Acute pain of left shoulder  Stiffness of left wrist, not elsewhere classified  Stiffness of left shoulder, not elsewhere classified  Other symptoms and signs involving the musculoskeletal system    Problem List Patient Active Problem List   Diagnosis Date Noted  . Wrist fracture 06/26/2016  . Humeral fracture 06/26/2016  . Encounter for screening colonoscopy 12/31/2015  . Memory difficulty 06/03/2014  . Headache disorder 06/03/2014  . Atrial flutter ([place]) 05/09/2013  . COPD (chronic obstructive pulmonary disease) ([place]) 05/09/2013  . Tobacco abuse 05/08/2013  . LBBB (left bundle branch block)- intermittent 05/08/2013  . Chronic diarrhea 05/11/2011  . Chronic nausea 05/11/2011  . Dyspepsia 02/24/2011  . IBS (irritable bowel syndrome) 02/24/2011  . Chest pain 12/05/2010  . Hypertension 12/05/2010   Guadelupe Sabin, OTR/L  [phone] 07/04/2016, 4:16 PM  [place] [address] [place], [place], [address] Phone: [phone]   Fax:  [phone]  Name: ARRYN TERRONES MRN: [id number] Date of Birth: 1950-03-03

## 2016-07-05 ENCOUNTER — Encounter (HOSPITAL_COMMUNITY): Payer: Self-pay | Admitting: Occupational Therapy

## 2016-07-05 ENCOUNTER — Ambulatory Visit (HOSPITAL_COMMUNITY): Payer: BLUE CROSS/BLUE SHIELD | Admitting: Occupational Therapy

## 2016-07-05 DIAGNOSIS — M25612 Stiffness of left shoulder, not elsewhere classified: Secondary | ICD-10-CM

## 2016-07-05 DIAGNOSIS — M25632 Stiffness of left wrist, not elsewhere classified: Secondary | ICD-10-CM

## 2016-07-05 DIAGNOSIS — M25512 Pain in left shoulder: Secondary | ICD-10-CM

## 2016-07-05 DIAGNOSIS — R29898 Other symptoms and signs involving the musculoskeletal system: Secondary | ICD-10-CM

## 2016-07-05 DIAGNOSIS — M25532 Pain in left wrist: Secondary | ICD-10-CM | POA: Diagnosis not present

## 2016-07-05 NOTE — Patient Instructions (Signed)
Home Exercises Program Theraputty Exercises  Do the following exercises 2 times a day using your affected hand.  1. Roll putty into a ball.  2. Make into a pancake.  3. Roll putty into a roll.  4. Pinch along log with first finger and thumb.   5. Make into a ball.  6. Roll it back into a log.   7. Pinch using thumb and side of first finger.  8. Roll into a ball, then flatten into a pancake.   

## 2016-07-05 NOTE — Therapy (Signed)
[place], [place], [address] Phone: [phone]   Fax:  [phone]  Occupational Therapy Treatment  Patient Details  Name: Melanie Bradley MRN: [id number] Date of Birth: 1950/10/26 Referring Provider: Dr. Meredith Pel  Encounter Date: 07/05/2016      OT End of Session - 07/05/16 1639    Visit Number 12   Number of Visits 30   Date for OT Re-Evaluation 08/06/16    Authorization Type [employer]   Authorization Time Period 30 visit limit   Authorization - Visit Number 12   Authorization - Number of Visits 30   OT Start Time 0277   OT Stop Time 1600   OT Time Calculation (min) 88 min   Activity Tolerance Patient tolerated treatment well   Behavior During Therapy [facility] for tasks assessed/performed      Past Medical History:  Diagnosis Date  . Atrial fibrillation ([place])   . Bloating   . Complication of anesthesia     " hard to wake up sometimes "  . Dementia   . Diverticula of colon   . Epigastric burning sensation   . GERD (gastroesophageal reflux disease)   . Headache disorder 06/03/2014  . Headache(784.0)   . Helicobacter pylori gastritis 2004  . Hemorrhoids 06/26/03   Dr. Laural Golden tcs  . Hiatal hernia 06/26/03   Dr. Laural Golden egd  . Hypertension   . Hypothyroidism   . 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 Memory difficulty 06/03/2014  . Shoulder fracture, left   . Spastic colon   . Wrist fracture     Past Surgical History:  Procedure Laterality Date  . APPENDECTOMY    . CHOLECYSTECTOMY    . COLONOSCOPY  06/26/03   Dr. Kem Boroughs diverticula at the sigmoid colon with one of the transverse colon, normal terminal ileoscopy, small external hemorrhoids the  . COLONOSCOPY N/A 01/20/2016   Procedure: COLONOSCOPY;  Surgeon: Daneil Dolin, MD;  [facility];  Service: Endoscopy;  Laterality: N/A;  2:30 pm  . ESOPHAGOGASTRODUODENOSCOPY  03/15/2011   Dr. Trevor Iha hiatal  hernia, abnormal gastric mucosa of unclear significance. Gastric biopsies negative, no H. pylori.Procedure: ESOPHAGOGASTRODUODENOSCOPY (EGD);  Surgeon: Daneil Dolin, MD;  [facility];  Service: Endoscopy;  Laterality: N/A;  7:30  . ORIF HUMERUS FRACTURE Left 05/29/2016   Procedure: LEFT OPEN REDUCTION INTERNAL FIXATION (ORIF) PROXIMAL HUMERUS FRACTURE;  Surgeon: Meredith Pel, MD;  Location: [place];  Service: Orthopedics;  Laterality: Left;  . ORIF WRIST FRACTURE Left 05/29/2016   Procedure: OPEN REDUCTION INTERNAL FIXATION (ORIF) LEFT WRIST FRACTURE;  Surgeon: Meredith Pel, MD;  Location: [place];  Service: Orthopedics;  Laterality: Left;  . TUBAL LIGATION    . UPPER GASTROINTESTINAL ENDOSCOPY  06/26/03   Dr. Wynelle Bourgeois sliding hiatal hernia with 8 mm tongue of gastric type mucosa at distal esophagus bile in the stomach.    There were no vitals filed for this visit.      Subjective Assessment - 07/05/16 1433    Subjective  S: I helped pick cherries today.    Currently in Pain? No/denies            [facility] OT Assessment - 07/05/16 1433      Assessment   Diagnosis s/p ORIF of left humerus fracture, ORIF left wrist     Precautions   Precautions Shoulder   Type of Shoulder Precautions Per MD: Begin with  P/ROM and progress as tolerated.      Palpation   Palpation comment Mod fascial restrictions along left wrist, dorsal and volar forearm, elbow, upper arm, trapezius, and scapularis regions     AROM   AROM Assessment Site Shoulder;Elbow;Forearm;Wrist   Right/Left Shoulder Left   Left Shoulder Flexion 105 Degrees  not previously assessed   Left Shoulder ABduction 112 Degrees  not previously assessed   Left Shoulder Internal Rotation 90 Degrees  not previously assessed   Left Shoulder External Rotation 46 Degrees  not previously assessed   Right/Left Elbow Left   Right/Left Forearm Left   Left Forearm Pronation 90 Degrees  not previously assessed    Left Forearm Supination 90 Degrees  not previously assessed   Right/Left Wrist Left   Left Wrist Extension 40 Degrees  not previously assessed   Left Wrist Flexion 40 Degrees  not previously assessed   Left Wrist Radial Deviation 20 Degrees  not previously assessed   Left Wrist Ulnar Deviation 20 Degrees  not previously assessed     PROM   Left Shoulder Flexion 136 Degrees  80 previous   Left Shoulder ABduction 150 Degrees  59 previous   Left Shoulder Internal Rotation 90 Degrees  same as previous   Left Shoulder External Rotation 58 Degrees  -8 previous   Left Elbow Flexion 148  127 previous   Left Elbow Extension 0  38 previous   Left Forearm Pronation 90 Degrees  same as previous   Left Forearm Supination 90 Degrees  0 previous   Left Wrist Extension 55 Degrees  10 degrees   Left Wrist Flexion 60 Degrees  15 previous   Left Wrist Radial Deviation 20 Degrees  5 previous   Left Wrist Ulnar Deviation 20 Degrees  5 previous     Strength   Strength Assessment Site Shoulder;Elbow;Forearm;Wrist;Hand   Right/Left Shoulder Left   Left Shoulder Flexion 3/5   Left Shoulder ABduction 3/5   Left Shoulder Internal Rotation 3/5   Left Shoulder External Rotation 3/5   Right/Left Elbow Left   Left Elbow Flexion 4-/5   Left Elbow Extension 3+/5   Right/Left Forearm Left   Left Forearm Pronation 3/5   Left Forearm Supination 3/5   Right/Left Wrist Left   Left Wrist Flexion 4/5   Left Wrist Extension 4/5   Left Wrist Radial Deviation 4/5   Left Wrist Ulnar Deviation 4/5   Left Hand Grip (lbs) 20  8 previous   Left Hand Lateral Pinch 7 lbs  3 previous   Left Hand 3 Point Pinch 6 lbs  2 previous                  OT Treatments/Exercises (OP) - 07/05/16 1436      Exercises   Exercises Shoulder;Elbow;Wrist;Hand     Shoulder Exercises: Supine   Protraction PROM;5 reps   Horizontal ABduction PROM;5 reps   External Rotation PROM;5 reps   Internal Rotation  PROM;5 reps   Flexion PROM;5 reps   ABduction PROM;5 reps     Shoulder Exercises: Seated   Protraction AROM;10 reps   Horizontal ABduction AROM;10 reps   External Rotation AROM;10 reps   Internal Rotation AROM;10 reps   Flexion AROM;10 reps   Abduction AROM;10 reps     Shoulder Exercises: Stretch   Corner Stretch 2 reps;10 seconds   Internal Rotation Stretch 2 reps  10 seconds   External Rotation Stretch 2 reps;10 seconds   Wall Stretch - Flexion 2  reps;10 seconds     Elbow Exercises   Wrist Flexion PROM;5 reps;AROM;10 reps   Wrist Extension PROM;5 reps;AROM;10 reps     Additional Elbow Exercises   Hand Gripper with Large Beads all beads 22#   Hand Gripper with Medium Beads all beads 20#   Hand Gripper with Small Beads all beads 20#     Wrist Exercises   Other wrist exercises velcro board-supination/pronation, 5 reps each along wide velcro strip     Additional Wrist Exercises   Sponges Pt used red clothespin with lateral pinch to grasp 25 high resistance sponges and place in bucket. Min/mod difficulty, fatigue at end of task.     Modalities   Modalities Ultrasound     Ultrasound   Ultrasound Location scar along anterior shoulder and deltoid   Ultrasound Parameters 3.60mz 1.5w/cm2   Ultrasound Goals Pain  scar tissue     Manual Therapy   Manual Therapy Myofascial release;Muscle Energy Technique   Manual therapy comments Completed separately from therapeutic exercise   Myofascial Release Myofascial release to left wrist, volar and dorsal forearm, elbow, upper arm, trapezius, and scapularis regions to decrease pain and fascial restrictions and increase joint range of motion. Scar massage to incisions to improve scar mobility and reduce fascial restrictions.    Muscle Energy Technique Muscle energy technqiue performed to decrease muscle spasm and increase range of motion                OT Education - 07/05/16 1636    Education provided Yes   Education Details  yellow theraputty for grip and pinch strengthening   Person(s) Educated Patient   Methods Explanation;Demonstration;Handout   Comprehension Verbalized understanding;Returned demonstration          OT Short Term Goals - 07/05/16 1639      OT SHORT TERM GOAL #1   Title Pt will be educated on HEP to improve LUE use during daily tasks.    Time 6   Period Weeks   Status On-going     OT SHORT TERM GOAL #2   Title Pt will decrease LUE pain to 5/10 to improve ability to assist with daily task completion.    Time 6   Period Weeks   Status Achieved     OT SHORT TERM GOAL #3   Title Pt will decrease LUE fascial restrictions from max to moderate amounts to improve mobility required for functional reaching tasks.    Time 6   Period Weeks   Status Achieved     OT SHORT TERM GOAL #4   Title Pt will [facility] to assist with don[facility].    Time 6   Period Weeks   Status Achieved     OT SHORT TERM GOAL #5   Title Pt will improve left elbow A/ROM to WNL to improve ability to use LUE as assist during meals.    Time 6   Period Weeks   Status On-going     OT SHORT TERM GOAL #6   Title Pt will improve left wrist [facility] improve ability to assist with grooming tasks.    Time 6   Period Weeks   Status Achieved     OT SHORT TERM GOAL #7   Title Pt will improve LUE strength to 3-/5 to improve ability to use LUE as assist during bathing tasks.    Time 6   Period Weeks   Status Achieved  OT SHORT TERM GOAL #8   Title Pt will increase LUE strength by 10# to improve ability to grasp a cup with LUE.    Time 6   Period Weeks   Status Achieved     OT SHORT TERM GOAL  #9   TITLE Pt will improve left pinch strength by 2# to improve ability to grasp utensils/tools with LUE.    Time 6   Period Weeks   Status Achieved           OT Long Term Goals - 07/05/16 1640      OT LONG TERM GOAL #1   Title Pt will return to prior level  of functioning and independence in ADL and leisure tasks use LUE as assist/non-dominant arm.    Time 12   Period Weeks   Status On-going     OT LONG TERM GOAL #2   Title Pt will decrease LUE pain to 3/10 or less to improve ability to use LUE for functional task completion.    Time 12   Period Weeks   Status On-going     OT LONG TERM GOAL #3   Title Pt will decrease LUE fascial restrictions from mod to min amounts or less to improve mobility required for overhead reaching.    Time 12   Period Weeks   Status On-going     OT LONG TERM GOAL #4   Title Pt will improve left shoulder A/ROM to [facility] to improve ability to wash hair.    Time 12   Period Weeks   Status On-going     OT LONG TERM GOAL #5   Title Pt will improve left wrist A/ROM to [facility] to improve ability to work in flowers.    Time 12   Period Weeks   Status Achieved     OT LONG TERM GOAL #6   Title Pt will improve LUE strength to 4/5 to improve ability to perform yardwork tasks.    Time 12   Period Weeks   Status On-going     OT LONG TERM GOAL #7   Title Pt will improve left grip strength by 15# to improve ability to grasp plates with left hand.    Time 12   Period Weeks   Status On-going     OT LONG TERM GOAL #8   Title Pt will improve left pinch strength by 5# to improve ability to grasp cooking supplies with left hand.    Time 12   Period Weeks   Status On-going               Plan - 07/05/16 1640    Clinical Impression Statement A: Reassessment completed this session, pt has met 7/9 STGs and 1/8 LTGs, making improvements in ROM, strength, pain, and fascial restrictions thus far during therapy. Pt reports she is now able to help wash dishes at home, as well as dress herself with little to no assistance. Decreased frequency of therapy to 2x/week as pt is making great progress and is completing exercises at home regularly.    Plan P: continue with [place] and cross friction massage, continue to work on improving  ROM within pain tolerance. Continue with shoulder stretches and add red scapular theraband when appropriate.    OT Home Exercise Plan 4/25: shoulder pendulums, elbow flexion/extension, finger/hand A/ROM; 5/9: AA/ROM shoulder; 5/16: wrist A/ROM; 5/23: yellow theraputty    Consulted and Agree with Plan of Care Patient;Family member/caregiver   Family Member Consulted sister-Alma  Patient will benefit from skilled therapeutic intervention in order to improve the following deficits and impairments:  Decreased scar mobility, Decreased knowledge of precautions, Decreased activity tolerance, Decreased strength, Impaired flexibility, Decreased range of motion, Pain, Increased edema, Decreased coordination, Increased fascial restricitons, Impaired UE functional use  Visit Diagnosis: Pain in left wrist  Acute pain of left shoulder  Stiffness of left wrist, not elsewhere classified  Stiffness of left shoulder, not elsewhere classified  Other symptoms and signs involving the musculoskeletal system    Problem List Patient Active Problem List   Diagnosis Date Noted  . Wrist fracture 06/26/2016  . Humeral fracture 06/26/2016  . Encounter for screening colonoscopy 12/31/2015  . Memory difficulty 06/03/2014  . Headache disorder 06/03/2014  . Atrial flutter ([place]) 05/09/2013  . COPD (chronic obstructive pulmonary disease) ([place]) 05/09/2013  . Tobacco abuse 05/08/2013  . LBBB (left bundle branch block)- intermittent 05/08/2013  . Chronic diarrhea 05/11/2011  . Chronic nausea 05/11/2011  . Dyspepsia 02/24/2011  . IBS (irritable bowel syndrome) 02/24/2011  . Chest pain 12/05/2010  . Hypertension 12/05/2010   Guadelupe Sabin, OTR/L  [phone] 07/05/2016, 4:45 PM  [facility] [address] [place], [place], [address] Phone: [phone]   Fax:  [phone]  Name: MAKENNA MACALUSO MRN: [id number] Date of Birth: 03/29/1950

## 2016-07-07 ENCOUNTER — Encounter (HOSPITAL_COMMUNITY): Payer: Self-pay | Admitting: Occupational Therapy

## 2016-07-07 ENCOUNTER — Ambulatory Visit (HOSPITAL_COMMUNITY): Payer: BLUE CROSS/BLUE SHIELD | Admitting: Occupational Therapy

## 2016-07-07 DIAGNOSIS — M25532 Pain in left wrist: Secondary | ICD-10-CM | POA: Diagnosis not present

## 2016-07-07 DIAGNOSIS — M25612 Stiffness of left shoulder, not elsewhere classified: Secondary | ICD-10-CM

## 2016-07-07 DIAGNOSIS — M25632 Stiffness of left wrist, not elsewhere classified: Secondary | ICD-10-CM

## 2016-07-07 DIAGNOSIS — M25512 Pain in left shoulder: Secondary | ICD-10-CM

## 2016-07-07 DIAGNOSIS — R29898 Other symptoms and signs involving the musculoskeletal system: Secondary | ICD-10-CM

## 2016-07-07 NOTE — Therapy (Signed)
[place], [place], [address] Phone: [phone]   Fax:  [phone]  Occupational Therapy Treatment  Patient Details  Name: Melanie Bradley MRN: [id number] Date of Birth: 07/09/1950 Referring Provider: Dr. Meredith Pel  Encounter Date: 07/07/2016      OT End of Session - 07/07/16 1601    Visit Number 13   Number of Visits 30   Date for OT Re-Evaluation 08/06/16   Authorization Type [employer]   Authorization Time Period 30 visit limit   Authorization - Visit Number 13   Authorization - Number of Visits 30   OT Start Time 6606   OT Stop Time 1556   OT Time Calculation (min) 81 min   Activity Tolerance Patient tolerated treatment well   Behavior During Therapy [facility] for tasks assessed/performed      Past Medical History:  Diagnosis Date  . Atrial fibrillation ([place])   . Bloating   . Complication of anesthesia     " hard to wake up sometimes "  . Dementia   . Diverticula of colon   . Epigastric burning sensation   . GERD (gastroesophageal reflux disease)   . Headache disorder 06/03/2014  . Headache(784.0)   . Helicobacter pylori gastritis 2004  . Hemorrhoids 06/26/03   Dr. Laural Golden tcs  . Hiatal hernia 06/26/03   Dr. Laural Golden egd  . Hypertension   . Hypothyroidism   . 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 Memory difficulty 06/03/2014  . Shoulder fracture, left   . Spastic colon   . Wrist fracture     Past Surgical History:  Procedure Laterality Date  . APPENDECTOMY    . CHOLECYSTECTOMY    . COLONOSCOPY  06/26/03   Dr. Kem Boroughs diverticula at the sigmoid colon with one of the transverse colon, normal terminal ileoscopy, small external hemorrhoids the  . COLONOSCOPY N/A 01/20/2016   Procedure: COLONOSCOPY;  Surgeon: Daneil Dolin, MD;  [facility];  Service: Endoscopy;  Laterality: N/A;  2:30 pm  . ESOPHAGOGASTRODUODENOSCOPY  03/15/2011   Dr. Trevor Iha hiatal  hernia, abnormal gastric mucosa of unclear significance. Gastric biopsies negative, no H. pylori.Procedure: ESOPHAGOGASTRODUODENOSCOPY (EGD);  Surgeon: Daneil Dolin, MD;  [facility];  Service: Endoscopy;  Laterality: N/A;  7:30  . ORIF HUMERUS FRACTURE Left 05/29/2016   Procedure: LEFT OPEN REDUCTION INTERNAL FIXATION (ORIF) PROXIMAL HUMERUS FRACTURE;  Surgeon: Meredith Pel, MD;  Location: [place];  Service: Orthopedics;  Laterality: Left;  . ORIF WRIST FRACTURE Left 05/29/2016   Procedure: OPEN REDUCTION INTERNAL FIXATION (ORIF) LEFT WRIST FRACTURE;  Surgeon: Meredith Pel, MD;  Location: [place];  Service: Orthopedics;  Laterality: Left;  . TUBAL LIGATION    . UPPER GASTROINTESTINAL ENDOSCOPY  06/26/03   Dr. Wynelle Bourgeois sliding hiatal hernia with 8 mm tongue of gastric type mucosa at distal esophagus bile in the stomach.    There were no vitals filed for this visit.      Subjective Assessment - 07/07/16 1439    Subjective  S: I've been working on turning my hand like this. (supination)   Currently in Pain? No/denies            [facility] OT Assessment - 07/07/16 1438      Assessment   Diagnosis s/p ORIF of left humerus fracture, ORIF left wrist     Precautions   Precautions Shoulder   Type of Shoulder Precautions Per  MD: Begin with P/ROM and progress as tolerated.                   OT Treatments/Exercises (OP) - 07/07/16 1439      Exercises   Exercises Shoulder;Elbow;Wrist;Hand     Shoulder Exercises: Supine   Protraction PROM;5 reps   Horizontal ABduction PROM;5 reps   External Rotation PROM;5 reps   Internal Rotation PROM;5 reps   Flexion PROM;5 reps   ABduction PROM;5 reps     Shoulder Exercises: Seated   Protraction AROM;10 reps   Horizontal ABduction AROM;10 reps   External Rotation AROM;10 reps   Internal Rotation AROM;10 reps   Flexion AROM;10 reps   Abduction AROM;10 reps     Shoulder Exercises: Standing   Extension  Theraband;10 reps   Theraband Level (Shoulder Extension) Level 2 (Red)   Row Theraband;10 reps   Theraband Level (Shoulder Row) Level 2 (Red)   Retraction Theraband;10 reps   Theraband Level (Shoulder Retraction) Level 2 (Red)     Shoulder Exercises: ROM/Strengthening   Proximal Shoulder Strengthening, Seated 10X each no rest breaks  visual cuing for technique     Shoulder Exercises: Stretch   Corner Stretch 3 reps;10 seconds   Internal Rotation Stretch 3 reps  10 seconds   External Rotation Stretch 3 reps;10 seconds   Wall Stretch - Flexion 3 reps;10 seconds     Elbow Exercises   Forearm Supination PROM;5 reps;AROM;15 reps   Forearm Pronation PROM;5 reps;AROM;15 reps   Wrist Flexion PROM;5 reps;AROM;15 reps   Wrist Extension PROM;5 reps;AROM;15 reps     Additional Elbow Exercises   Hand Gripper with Large Beads all beads 25#   Hand Gripper with Medium Beads all beads 22#   Hand Gripper with Small Beads 5/15 beads 20#     Weighted Stretch Over Towel Roll   Supination - Weighted Stretch 1 pound;60 seconds   Wrist Flexion - Weighted Stretch 1 pound;60 seconds   Wrist Extension - Weighted Stretch 1 pound;60 seconds     Wrist Exercises   Other wrist exercises velcro board-supination/pronation, 5 reps each along wide velcro strip     Additional Wrist Exercises   Sponges Pt used red clothespin with 3 point pinch to grasp 25 high resistance sponges and place in bucket. Min/mod difficulty, fatigue at end of task.     Modalities   Modalities Ultrasound     Ultrasound   Ultrasound Location scar along anterior shoulder and deltoid   Ultrasound Parameters 3.48mhz 1.5 w/cm2   Ultrasound Goals Pain  scar restrictions     Manual Therapy   Manual Therapy Myofascial release;Muscle Energy Technique   Manual therapy comments Completed separately from therapeutic exercise   Myofascial Release Myofascial release to left wrist, volar and dorsal forearm, elbow, upper arm, trapezius, and  scapularis regions to decrease pain and fascial restrictions and increase joint range of motion. Scar massage to incisions to improve scar mobility and reduce fascial restrictions.    Muscle Energy Technique Muscle energy technqiue performed to decrease muscle spasm and increase range of motion                  OT Short Term Goals - 07/05/16 1639      OT SHORT TERM GOAL #1   Title Pt will be educated on HEP to improve LUE use during daily tasks.    Time 6   Period Weeks   Status On-going     OT SHORT TERM GOAL #2  Title Pt will decrease LUE pain to 5/10 to improve ability to assist with daily task completion.    Time 6   Period Weeks   Status Achieved     OT SHORT TERM GOAL #3   Title Pt will decrease LUE fascial restrictions from max to moderate amounts to improve mobility required for functional reaching tasks.    Time 6   Period Weeks   Status Achieved     OT SHORT TERM GOAL #4   Title Pt will improve left shoulder P/ROM to [facility] to improve ability to assist with donning shirts.    Time 6   Period Weeks   Status Achieved     OT SHORT TERM GOAL #5   Title Pt will improve left elbow A/ROM to WNL to improve ability to use LUE as assist during meals.    Time 6   Period Weeks   Status On-going     OT SHORT TERM GOAL #6   Title Pt will improve left wrist P/ROM to [facility] to improve ability to assist with grooming tasks.    Time 6   Period Weeks   Status Achieved     OT SHORT TERM GOAL #7   Title Pt will improve LUE strength to 3-/5 to improve ability to use LUE as assist during bathing tasks.    Time 6   Period Weeks   Status Achieved     OT SHORT TERM GOAL #8   Title Pt will increase LUE strength by 10# to improve ability to grasp a cup with LUE.    Time 6   Period Weeks   Status Achieved     OT SHORT TERM GOAL  #9   TITLE Pt will improve left pinch strength by 2# to improve ability to grasp utensils/tools with LUE.    Time 6   Period Weeks   Status  Achieved           OT Long Term Goals - 07/05/16 1640      OT LONG TERM GOAL #1   Title Pt will return to prior level of functioning and independence in ADL and leisure tasks use LUE as assist/non-dominant arm.    Time 12   Period Weeks   Status On-going     OT LONG TERM GOAL #2   Title Pt will decrease LUE pain to 3/10 or less to improve ability to use LUE for functional task completion.    Time 12   Period Weeks   Status On-going     OT LONG TERM GOAL #3   Title Pt will decrease LUE fascial restrictions from mod to min amounts or less to improve mobility required for overhead reaching.    Time 12   Period Weeks   Status On-going     OT LONG TERM GOAL #4   Title Pt will improve left shoulder A/ROM to [facility] to improve ability to wash hair.    Time 12   Period Weeks   Status On-going     OT LONG TERM GOAL #5   Title Pt will improve left wrist A/ROM to [facility] to improve ability to work in flowers.    Time 12   Period Weeks   Status Achieved     OT LONG TERM GOAL #6   Title Pt will improve LUE strength to 4/5 to improve ability to perform yardwork tasks.    Time 12   Period Weeks   Status On-going  OT LONG TERM GOAL #7   Title Pt will improve left grip strength by 15# to improve ability to grasp plates with left hand.    Time 12   Period Weeks   Status On-going     OT LONG TERM GOAL #8   Title Pt will improve left pinch strength by 5# to improve ability to grasp cooking supplies with left hand.    Time 12   Period Weeks   Status On-going               Plan - 07/07/16 1551    Clinical Impression Statement A: Added red scapular theraband, continued with shoulder stretches this session. Pt requiring tactile cuing initially for correct form with new exercises, verbal cuing for technique for remaining exercises. Pt with difficulty with gripper task due to fatigue.    Plan P: continue with [place] and cross friction massage, continue to work on improving ROM.  Provide shoulder stretching HEP and educated pt and caregiver. Attempt 20# with small beads with gripper.    OT Home Exercise Plan 4/25: shoulder pendulums, elbow flexion/extension, finger/hand A/ROM; 5/9: AA/ROM shoulder; 5/16: wrist A/ROM; 5/23: yellow theraputty    Consulted and Agree with Plan of Care Patient      Patient will benefit from skilled therapeutic intervention in order to improve the following deficits and impairments:  Decreased scar mobility, Decreased knowledge of precautions, Decreased activity tolerance, Decreased strength, Impaired flexibility, Decreased range of motion, Pain, Increased edema, Decreased coordination, Increased fascial restricitons, Impaired UE functional use  Visit Diagnosis: Pain in left wrist  Acute pain of left shoulder  Stiffness of left wrist, not elsewhere classified  Stiffness of left shoulder, not elsewhere classified  Other symptoms and signs involving the musculoskeletal system    Problem List Patient Active Problem List   Diagnosis Date Noted  . Wrist fracture 06/26/2016  . Humeral fracture 06/26/2016  . Encounter for screening colonoscopy 12/31/2015  . Memory difficulty 06/03/2014  . Headache disorder 06/03/2014  . Atrial flutter ([place]) 05/09/2013  . COPD (chronic obstructive pulmonary disease) ([place]) 05/09/2013  . Tobacco abuse 05/08/2013  . LBBB (left bundle branch block)- intermittent 05/08/2013  . Chronic diarrhea 05/11/2011  . Chronic nausea 05/11/2011  . Dyspepsia 02/24/2011  . IBS (irritable bowel syndrome) 02/24/2011  . Chest pain 12/05/2010  . Hypertension 12/05/2010   Guadelupe Sabin, OTR/L  [phone] 07/07/2016, 4:02 PM  [place] [address] [place], [place], [address] Phone: [phone]   Fax:  [phone]  Name: Melanie Bradley MRN: [id number] Date of Birth: 1950/09/14

## 2016-07-11 ENCOUNTER — Encounter (HOSPITAL_COMMUNITY): Payer: Self-pay | Admitting: Occupational Therapy

## 2016-07-11 ENCOUNTER — Ambulatory Visit (HOSPITAL_COMMUNITY): Payer: BLUE CROSS/BLUE SHIELD | Admitting: Occupational Therapy

## 2016-07-11 DIAGNOSIS — M25532 Pain in left wrist: Secondary | ICD-10-CM | POA: Diagnosis not present

## 2016-07-11 DIAGNOSIS — M25612 Stiffness of left shoulder, not elsewhere classified: Secondary | ICD-10-CM

## 2016-07-11 DIAGNOSIS — R29898 Other symptoms and signs involving the musculoskeletal system: Secondary | ICD-10-CM

## 2016-07-11 DIAGNOSIS — M25512 Pain in left shoulder: Secondary | ICD-10-CM

## 2016-07-11 DIAGNOSIS — M25632 Stiffness of left wrist, not elsewhere classified: Secondary | ICD-10-CM

## 2016-07-11 NOTE — Patient Instructions (Signed)
  1) Flexion Wall Stretch    Face wall, place affected handon wall in front of you. Slide hand up the wall  and lean body in towards the wall. Hold for 10 seconds. Repeat 3 times. 1-2 times/day.     2) Towel Stretch with Internal Rotation   Gently pull up your affected arm  behind your back with the assist of a towel. Hold for 10 seconds, repeat 3X. Complete 1-2x/day.             3) Corner Stretch    Stand at a corner of a wall, place your arms on the walls with elbows bent. Lean into the corner until a stretch is felt along the front of your chest and/or shoulders. Hold for 10 seconds. Repeat 3X, 1-2 times/day.    4) Posterior Capsule Stretch    Bring the involved arm across chest. Grasp elbow and pull toward chest until you feel a stretch in the back of the upper arm and shoulder. Hold 10 seconds, repeat 3 times. Complete 1-2x/day     5) Scapular Retraction    Tuck chin back as you pinch shoulder blades together.  Hold 5 seconds.Complete 3 times. Complete 1-2x/day   6) External Rotation Stretch:     Place your affected hand on the wall with the elbow bent and gently turn your body the opposite direction until a stretch is felt. Hold for 10 seconds, complete 3 times. Complete 1-2x/day

## 2016-07-11 NOTE — Therapy (Signed)
[place], [place], [address] Phone: [phone]   Fax:  [phone]  Occupational Therapy Treatment  Patient Details  Name: Melanie Bradley MRN: [id number] Date of Birth: May 09, 1950 Referring Provider: Dr. Meredith Pel  Encounter Date: 07/11/2016      OT End of Session - 07/11/16 1601    Visit Number 14   Number of Visits 30   Date for OT Re-Evaluation 08/06/16   Authorization Type BCBS   Authorization Time Period 30 visit limit   Authorization - Visit Number 14   Authorization - Number of Visits 30   OT Start Time 9833   OT Stop Time 1600   OT Time Calculation (min) 87 min   Activity Tolerance Patient tolerated treatment well   Behavior During Therapy [facility] for tasks assessed/performed      Past Medical History:  Diagnosis Date  . Atrial fibrillation ([place])   . Bloating   . Complication of anesthesia     " hard to wake up sometimes "  . Dementia   . Diverticula of colon   . Epigastric burning sensation   . GERD (gastroesophageal reflux disease)   . Headache disorder 06/03/2014  . Headache(784.0)   . Helicobacter pylori gastritis 2004  . Hemorrhoids 06/26/03   Dr. Laural Golden tcs  . Hiatal hernia 06/26/03   Dr. Laural Golden egd  . Hypertension   . Hypothyroidism   . 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 Memory difficulty 06/03/2014  . Shoulder fracture, left   . Spastic colon   . Wrist fracture     Past Surgical History:  Procedure Laterality Date  . APPENDECTOMY    . CHOLECYSTECTOMY    . COLONOSCOPY  06/26/03   Dr. Kem Boroughs diverticula at the sigmoid colon with one of the transverse colon, normal terminal ileoscopy, small external hemorrhoids the  . COLONOSCOPY N/A 01/20/2016   Procedure: COLONOSCOPY;  Surgeon: Daneil Dolin, MD;  [facility];  Service: Endoscopy;  Laterality: N/A;  2:30 pm  . ESOPHAGOGASTRODUODENOSCOPY  03/15/2011   Dr. Trevor Iha hiatal  hernia, abnormal gastric mucosa of unclear significance. Gastric biopsies negative, no H. pylori.Procedure: ESOPHAGOGASTRODUODENOSCOPY (EGD);  Surgeon: Daneil Dolin, MD;  [facility];  Service: Endoscopy;  Laterality: N/A;  7:30  . ORIF HUMERUS FRACTURE Left 05/29/2016   Procedure: LEFT OPEN REDUCTION INTERNAL FIXATION (ORIF) PROXIMAL HUMERUS FRACTURE;  Surgeon: Meredith Pel, MD;  Location: [place];  Service: Orthopedics;  Laterality: Left;  . ORIF WRIST FRACTURE Left 05/29/2016   Procedure: OPEN REDUCTION INTERNAL FIXATION (ORIF) LEFT WRIST FRACTURE;  Surgeon: Meredith Pel, MD;  Location: [place];  Service: Orthopedics;  Laterality: Left;  . TUBAL LIGATION    . UPPER GASTROINTESTINAL ENDOSCOPY  06/26/03   Dr. Wynelle Bourgeois sliding hiatal hernia with 8 mm tongue of gastric type mucosa at distal esophagus bile in the stomach.    There were no vitals filed for this visit.      Subjective Assessment - 07/11/16 1433    Subjective  S: I just do everything like you tell me to do to get better.    Currently in Pain? No/denies            [facility] OT Assessment - 07/11/16 1433      Assessment   Diagnosis s/p ORIF of left humerus fracture, ORIF left wrist     Precautions   Precautions Shoulder   Type  of Shoulder Precautions Per MD: Begin with P/ROM and progress as tolerated.                   OT Treatments/Exercises (OP) - 07/11/16 1436      Exercises   Exercises Shoulder;Elbow;Wrist;Hand     Shoulder Exercises: Supine   Protraction PROM;5 reps   Horizontal ABduction PROM;5 reps   External Rotation PROM;5 reps   Internal Rotation PROM;5 reps   Flexion PROM;5 reps   ABduction PROM;5 reps     Shoulder Exercises: Seated   Protraction AROM;12 reps   Horizontal ABduction AROM;12 reps   External Rotation AROM;12 reps   Internal Rotation AROM;12 reps   Flexion AROM;12 reps   Abduction AROM;12 reps     Shoulder Exercises: Standing   Extension  Theraband;10 reps   Theraband Level (Shoulder Extension) Level 2 (Red)   Row Theraband;10 reps   Theraband Level (Shoulder Row) Level 2 (Red)   Retraction Theraband;10 reps   Theraband Level (Shoulder Retraction) Level 2 (Red)     Shoulder Exercises: ROM/Strengthening   UBE (Upper Arm Bike) Level 1 2' forward 2' reverse   Wall Wash 1'   X to V Arms 10X   Proximal Shoulder Strengthening, Seated 10X each no rest breaks  visual cuing for technique     Shoulder Exercises: Stretch   Corner Stretch 3 reps;10 seconds   Internal Rotation Stretch 3 reps  10 seconds   External Rotation Stretch 3 reps;10 seconds   Wall Stretch - Flexion 3 reps;10 seconds     Elbow Exercises   Forearm Supination PROM;5 reps;AROM;15 reps   Forearm Pronation PROM;5 reps;AROM;15 reps   Wrist Flexion PROM;5 reps;AROM;15 reps   Wrist Extension PROM;5 reps;AROM;15 reps     Additional Elbow Exercises   Hand Gripper with Large Beads all beads 25#   Hand Gripper with Medium Beads all beads 22#   Hand Gripper with Small Beads all beads 20#     Weighted Stretch Over Towel Roll   Supination - Weighted Stretch 2 pounds;60 seconds   Wrist Flexion - Weighted Stretch 2 pounds;60 seconds   Wrist Extension - Weighted Stretch 2 pounds;60 seconds     Additional Wrist Exercises   Theraputty - Locate Pegs yellow-6 pegs located     Modalities   Modalities Ultrasound     Ultrasound   Ultrasound Location scar along anterior shoulder and deltoid   Ultrasound Parameters 3.1mhz, 1.5w/cm2   Ultrasound Goals Pain  scar restrictions     Manual Therapy   Manual Therapy Myofascial release;Muscle Energy Technique   Manual therapy comments Completed separately from therapeutic exercise   Myofascial Release Myofascial release to left wrist, volar and dorsal forearm, elbow, upper arm, trapezius, and scapularis regions to decrease pain and fascial restrictions and increase joint range of motion. Scar massage to incisions to  improve scar mobility and reduce fascial restrictions.    Muscle Energy Technique Muscle energy technqiue performed to decrease muscle spasm and increase range of motion                OT Education - 07/11/16 1556    Education provided Yes   Education Details shoulder stretches   Person(s) Educated Patient   Methods Explanation;Demonstration;Handout   Comprehension Verbalized understanding;Returned demonstration          OT Short Term Goals - 07/05/16 1639      OT SHORT TERM GOAL #1   Title Pt will be educated on HEP to improve LUE  use during daily tasks.    Time 6   Period Weeks   Status On-going     OT SHORT TERM GOAL #2   Title Pt will decrease LUE pain to 5/10 to improve ability to assist with daily task completion.    Time 6   Period Weeks   Status Achieved     OT SHORT TERM GOAL #3   Title Pt will decrease LUE fascial restrictions from max to moderate amounts to improve mobility required for functional reaching tasks.    Time 6   Period Weeks   Status Achieved     OT SHORT TERM GOAL #4   Title Pt will improve left shoulder P/ROM to [facility] to improve ability to assist with donning shirts.    Time 6   Period Weeks   Status Achieved     OT SHORT TERM GOAL #5   Title Pt will improve left elbow A/ROM to WNL to improve ability to use LUE as assist during meals.    Time 6   Period Weeks   Status On-going     OT SHORT TERM GOAL #6   Title Pt will improve left wrist P/ROM to [facility] to improve ability to assist with grooming tasks.    Time 6   Period Weeks   Status Achieved     OT SHORT TERM GOAL #7   Title Pt will improve LUE strength to 3-/5 to improve ability to use LUE as assist during bathing tasks.    Time 6   Period Weeks   Status Achieved     OT SHORT TERM GOAL #8   Title Pt will increase LUE strength by 10# to improve ability to grasp a cup with LUE.    Time 6   Period Weeks   Status Achieved     OT SHORT TERM GOAL  #9   TITLE Pt will improve  left pinch strength by 2# to improve ability to grasp utensils/tools with LUE.    Time 6   Period Weeks   Status Achieved           OT Long Term Goals - 07/05/16 1640      OT LONG TERM GOAL #1   Title Pt will return to prior level of functioning and independence in ADL and leisure tasks use LUE as assist/non-dominant arm.    Time 12   Period Weeks   Status On-going     OT LONG TERM GOAL #2   Title Pt will decrease LUE pain to 3/10 or less to improve ability to use LUE for functional task completion.    Time 12   Period Weeks   Status On-going     OT LONG TERM GOAL #3   Title Pt will decrease LUE fascial restrictions from mod to min amounts or less to improve mobility required for overhead reaching.    Time 12   Period Weeks   Status On-going     OT LONG TERM GOAL #4   Title Pt will improve left shoulder A/ROM to [facility] to improve ability to wash hair.    Time 12   Period Weeks   Status On-going     OT LONG TERM GOAL #5   Title Pt will improve left wrist A/ROM to [facility] to improve ability to work in flowers.    Time 12   Period Weeks   Status Achieved     OT LONG TERM GOAL #6   Title Pt will improve  LUE strength to 4/5 to improve ability to perform yardwork tasks.    Time 12   Period Weeks   Status On-going     OT LONG TERM GOAL #7   Title Pt will improve left grip strength by 15# to improve ability to grasp plates with left hand.    Time 12   Period Weeks   Status On-going     OT LONG TERM GOAL #8   Title Pt will improve left pinch strength by 5# to improve ability to grasp cooking supplies with left hand.    Time 12   Period Weeks   Status On-going               Plan - 07/11/16 1601    Clinical Impression Statement A: Pt able to complete small beads with gripper at 20#, increased time and cuing for technique required. Pt able to tolerate increased P/ROM this session, also working on improving A/ROM in sitting. Pt with significantly less difficulty with  shoulder stretches and wall wash this session. Updated HEP for shoulder stretches and provided handout.    OT Home Exercise Plan 4/25: shoulder pendulums, elbow flexion/extension, finger/hand A/ROM; 5/9: AA/ROM shoulder; 5/16: wrist A/ROM; 5/23: yellow theraputty; 5/29: shoulder stretches   Consulted and Agree with Plan of Care Patient   Plan P: Follow up on shoulder stretching HEP. Add wrist extension table stretch.       Patient will benefit from skilled therapeutic intervention in order to improve the following deficits and impairments:  Decreased scar mobility, Decreased knowledge of precautions, Decreased activity tolerance, Decreased strength, Impaired flexibility, Decreased range of motion, Pain, Increased edema, Decreased coordination, Increased fascial restricitons, Impaired UE functional use  Visit Diagnosis: Pain in left wrist  Acute pain of left shoulder  Stiffness of left wrist, not elsewhere classified  Stiffness of left shoulder, not elsewhere classified  Other symptoms and signs involving the musculoskeletal system    Problem List Patient Active Problem List   Diagnosis Date Noted  . Wrist fracture 06/26/2016  . Humeral fracture 06/26/2016  . Encounter for screening colonoscopy 12/31/2015  . Memory difficulty 06/03/2014  . Headache disorder 06/03/2014  . Atrial flutter ([place]) 05/09/2013  . COPD (chronic obstructive pulmonary disease) ([place]) 05/09/2013  . Tobacco abuse 05/08/2013  . LBBB (left bundle branch block)- intermittent 05/08/2013  . Chronic diarrhea 05/11/2011  . Chronic nausea 05/11/2011  . Dyspepsia 02/24/2011  . IBS (irritable bowel syndrome) 02/24/2011  . Chest pain 12/05/2010  . Hypertension 12/05/2010   Guadelupe Sabin, OTR/L  [phone] 07/11/2016, 4:05 PM  [place], [place], [address] Phone: [phone]   Fax:  [phone]  Name: Melanie Bradley MRN: [id number] Date  of Birth: [birth year]

## 2016-07-12 ENCOUNTER — Ambulatory Visit (HOSPITAL_COMMUNITY): Payer: BLUE CROSS/BLUE SHIELD | Admitting: Occupational Therapy

## 2016-07-14 ENCOUNTER — Ambulatory Visit (HOSPITAL_COMMUNITY): Payer: BLUE CROSS/BLUE SHIELD | Attending: Orthopedic Surgery | Admitting: Occupational Therapy

## 2016-07-14 ENCOUNTER — Encounter (HOSPITAL_COMMUNITY): Payer: Self-pay | Admitting: Occupational Therapy

## 2016-07-14 DIAGNOSIS — M25532 Pain in left wrist: Secondary | ICD-10-CM | POA: Diagnosis present

## 2016-07-14 DIAGNOSIS — R29898 Other symptoms and signs involving the musculoskeletal system: Secondary | ICD-10-CM

## 2016-07-14 DIAGNOSIS — M25512 Pain in left shoulder: Secondary | ICD-10-CM | POA: Diagnosis present

## 2016-07-14 DIAGNOSIS — M25632 Stiffness of left wrist, not elsewhere classified: Secondary | ICD-10-CM

## 2016-07-14 DIAGNOSIS — M25612 Stiffness of left shoulder, not elsewhere classified: Secondary | ICD-10-CM | POA: Diagnosis present

## 2016-07-14 NOTE — Addendum Note (Signed)
Addendum  created 07/14/16 1351 by Rica Koyanagi, MD   Sign clinical note

## 2016-07-14 NOTE — Therapy (Signed)
[place], [place], [address] Phone: [phone]   Fax:  [phone]  Occupational Therapy Treatment  Patient Details  Name: Melanie Bradley MRN: [id number] Date of Birth: [birth year] Referring Provider: Dr. Meredith Pel  Encounter Date: 07/14/2016      OT End of Session - 07/14/16 1603    Visit Number 15   Number of Visits 30   Date for OT Re-Evaluation 08/06/16   Authorization Type BCBS   Authorization Time Period 30 visit limit   Authorization - Visit Number 15   Authorization - Number of Visits 30   OT Start Time 7619   OT Stop Time 1550   OT Time Calculation (min) 79 min   Activity Tolerance Patient tolerated treatment well   Behavior During Therapy [facility] for tasks assessed/performed      Past Medical History:  Diagnosis Date  . Atrial fibrillation ([place])   . Bloating   . Complication of anesthesia     " hard to wake up sometimes "  . Dementia   . Diverticula of colon   . Epigastric burning sensation   . GERD (gastroesophageal reflux disease)   . Headache disorder 06/03/2014  . Headache(784.0)   . Helicobacter pylori gastritis 2004  . Hemorrhoids 06/26/03   Dr. Laural Golden tcs  . Hiatal hernia 06/26/03   Dr. Laural Golden egd  . Hypertension   . Hypothyroidism   . 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 Memory difficulty 06/03/2014  . Shoulder fracture, left   . Spastic colon   . Wrist fracture     Past Surgical History:  Procedure Laterality Date  . APPENDECTOMY    . CHOLECYSTECTOMY    . COLONOSCOPY  06/26/03   Dr. Kem Boroughs diverticula at the sigmoid colon with one of the transverse colon, normal terminal ileoscopy, small external hemorrhoids the  . COLONOSCOPY N/A 01/20/2016   Procedure: COLONOSCOPY;  Surgeon: Daneil Dolin, MD;  [facility];  Service: Endoscopy;  Laterality: N/A;  2:30 pm  . ESOPHAGOGASTRODUODENOSCOPY  03/15/2011   Dr. Trevor Iha hiatal  hernia, abnormal gastric mucosa of unclear significance. Gastric biopsies negative, no H. pylori.Procedure: ESOPHAGOGASTRODUODENOSCOPY (EGD);  Surgeon: Daneil Dolin, MD;  [facility];  Service: Endoscopy;  Laterality: N/A;  7:30  . ORIF HUMERUS FRACTURE Left 05/29/2016   Procedure: LEFT OPEN REDUCTION INTERNAL FIXATION (ORIF) PROXIMAL HUMERUS FRACTURE;  Surgeon: Meredith Pel, MD;  Location: [place];  Service: Orthopedics;  Laterality: Left;  . ORIF WRIST FRACTURE Left 05/29/2016   Procedure: OPEN REDUCTION INTERNAL FIXATION (ORIF) LEFT WRIST FRACTURE;  Surgeon: Meredith Pel, MD;  Location: [place];  Service: Orthopedics;  Laterality: Left;  . TUBAL LIGATION    . UPPER GASTROINTESTINAL ENDOSCOPY  06/26/03   Dr. Wynelle Bourgeois sliding hiatal hernia with 8 mm tongue of gastric type mucosa at distal esophagus bile in the stomach.    There were no vitals filed for this visit.      Subjective Assessment - 07/14/16 1430    Subjective  S: You wouldn't believe the stuff I've been doing.    Currently in Pain? No/denies            [facility] OT Assessment - 07/14/16 1429      Assessment   Diagnosis s/p ORIF of left humerus fracture, ORIF left wrist     Precautions   Precautions Shoulder   Type of Shoulder Precautions Per MD:  Begin with P/ROM and progress as tolerated.                   OT Treatments/Exercises (OP) - 07/14/16 1434      Exercises   Exercises Shoulder;Elbow;Wrist;Hand     Shoulder Exercises: Supine   Protraction PROM;5 reps   Horizontal ABduction PROM;5 reps   External Rotation PROM;5 reps   Internal Rotation PROM;5 reps   Flexion PROM;5 reps   ABduction PROM;5 reps     Shoulder Exercises: Seated   Protraction AROM;12 reps   Horizontal ABduction AROM;12 reps   External Rotation AROM;12 reps   Internal Rotation AROM;12 reps   Flexion AROM;12 reps   Abduction AROM;12 reps     Shoulder Exercises: Standing   Extension Theraband;10 reps    Theraband Level (Shoulder Extension) Level 2 (Red)   Row Theraband;10 reps   Theraband Level (Shoulder Row) Level 2 (Red)   Retraction Theraband;10 reps   Theraband Level (Shoulder Retraction) Level 2 (Red)     Shoulder Exercises: ROM/Strengthening   UBE (Upper Arm Bike) Level 1 1' forward 1' reverse   "W" Arms 10X    X to V Arms 10X   Proximal Shoulder Strengthening, Seated 10X each no rest breaks  visual cuing for technique     Shoulder Exercises: Stretch   Corner Stretch 3 reps;10 seconds   Internal Rotation Stretch 3 reps  10 seconds   External Rotation Stretch 3 reps;10 seconds   Wall Stretch - Flexion 3 reps;10 seconds     Elbow Exercises   Forearm Supination AROM;15 reps   Forearm Pronation AROM;15 reps   Wrist Flexion PROM;5 reps;AROM;15 reps   Wrist Extension PROM;5 reps;AROM;15 reps     Additional Elbow Exercises   Hand Gripper with Large Beads all beads 25#   Hand Gripper with Medium Beads all beads 22#   Hand Gripper with Small Beads all beads 20#     Weighted Stretch Over Towel Roll   Supination - Weighted Stretch 2 pounds;60 seconds   Wrist Flexion - Weighted Stretch 2 pounds;60 seconds   Wrist Extension - Weighted Stretch 2 pounds;60 seconds     Wrist Exercises   Other wrist exercises wrist extension table stretch, 2x, 10 seconds     Additional Wrist Exercises   Sponges Pt used red clothespin with lateral pinch to grasp 25 high resistance sponges and place in bucket. Min difficulty, fatigue at end of task.     Functional Reaching Activities   Mid Level Pt placed clothespins along pinch tree with LUE, focusing on shoulder flexion and pinch strengthening, able to place 22 pins     Modalities   Modalities Ultrasound     Ultrasound   Ultrasound Location scar along anterior shoulder and deltoid   Ultrasound Parameters 3.78mhz, 1.5w/cm2   Ultrasound Goals Pain  scar restrictions     Manual Therapy   Manual Therapy Myofascial release;Muscle Energy  Technique   Manual therapy comments Completed separately from therapeutic exercise   Myofascial Release Myofascial release to left wrist, volar and dorsal forearm, elbow, upper arm, trapezius, and scapularis regions to decrease pain and fascial restrictions and increase joint range of motion. Scar massage to incisions to improve scar mobility and reduce fascial restrictions.    Muscle Energy Technique Muscle energy technqiue performed to decrease muscle spasm and increase range of motion                  OT Short Term Goals - 07/05/16 1639  OT SHORT TERM GOAL #1   Title Pt will be educated on HEP to improve LUE use during daily tasks.    Time 6   Period Weeks   Status On-going     OT SHORT TERM GOAL #2   Title Pt will decrease LUE pain to 5/10 to improve ability to assist with daily task completion.    Time 6   Period Weeks   Status Achieved     OT SHORT TERM GOAL #3   Title Pt will decrease LUE fascial restrictions from max to moderate amounts to improve mobility required for functional reaching tasks.    Time 6   Period Weeks   Status Achieved     OT SHORT TERM GOAL #4   Title Pt will improve left shoulder P/ROM to [facility] to improve ability to assist with donning shirts.    Time 6   Period Weeks   Status Achieved     OT SHORT TERM GOAL #5   Title Pt will improve left elbow A/ROM to WNL to improve ability to use LUE as assist during meals.    Time 6   Period Weeks   Status On-going     OT SHORT TERM GOAL #6   Title Pt will improve left wrist P/ROM to [facility] to improve ability to assist with grooming tasks.    Time 6   Period Weeks   Status Achieved     OT SHORT TERM GOAL #7   Title Pt will improve LUE strength to 3-/5 to improve ability to use LUE as assist during bathing tasks.    Time 6   Period Weeks   Status Achieved     OT SHORT TERM GOAL #8   Title Pt will increase LUE strength by 10# to improve ability to grasp a cup with LUE.    Time 6   Period  Weeks   Status Achieved     OT SHORT TERM GOAL  #9   TITLE Pt will improve left pinch strength by 2# to improve ability to grasp utensils/tools with LUE.    Time 6   Period Weeks   Status Achieved           OT Long Term Goals - 07/05/16 1640      OT LONG TERM GOAL #1   Title Pt will return to prior level of functioning and independence in ADL and leisure tasks use LUE as assist/non-dominant arm.    Time 12   Period Weeks   Status On-going     OT LONG TERM GOAL #2   Title Pt will decrease LUE pain to 3/10 or less to improve ability to use LUE for functional task completion.    Time 12   Period Weeks   Status On-going     OT LONG TERM GOAL #3   Title Pt will decrease LUE fascial restrictions from mod to min amounts or less to improve mobility required for overhead reaching.    Time 12   Period Weeks   Status On-going     OT LONG TERM GOAL #4   Title Pt will improve left shoulder A/ROM to [facility] to improve ability to wash hair.    Time 12   Period Weeks   Status On-going     OT LONG TERM GOAL #5   Title Pt will improve left wrist A/ROM to [facility] to improve ability to work in flowers.    Time 12   Period Weeks  Status Achieved     OT LONG TERM GOAL #6   Title Pt will improve LUE strength to 4/5 to improve ability to perform yardwork tasks.    Time 12   Period Weeks   Status On-going     OT LONG TERM GOAL #7   Title Pt will improve left grip strength by 15# to improve ability to grasp plates with left hand.    Time 12   Period Weeks   Status On-going     OT LONG TERM GOAL #8   Title Pt will improve left pinch strength by 5# to improve ability to grasp cooking supplies with left hand.    Time 12   Period Weeks   Status On-going               Plan - 07/14/16 1603    Clinical Impression Statement A: Added wrist extension table stretch today, pt with mod difficulty maintaining elbow at 0 degrees extension during stretch. Pt completing shoulder stretches  during session, able to complete with verbal cuing for maximum stretch. Attempted pinch activity with green clothespin, unable to complete due to weakness with pinch.    Plan P: Increase theraband repetitions, resume wall wash, add overhead lacing, add 1# weight to wrist exercises   OT Home Exercise Plan 4/25: shoulder pendulums, elbow flexion/extension, finger/hand A/ROM; 5/9: AA/ROM shoulder; 5/16: wrist A/ROM; 5/23: yellow theraputty; 5/29: shoulder stretches   Consulted and Agree with Plan of Care Patient      Patient will benefit from skilled therapeutic intervention in order to improve the following deficits and impairments:  Decreased scar mobility, Decreased knowledge of precautions, Decreased activity tolerance, Decreased strength, Impaired flexibility, Decreased range of motion, Pain, Increased edema, Decreased coordination, Increased fascial restricitons, Impaired UE functional use  Visit Diagnosis: Pain in left wrist  Acute pain of left shoulder  Stiffness of left wrist, not elsewhere classified  Stiffness of left shoulder, not elsewhere classified  Other symptoms and signs involving the musculoskeletal system    Problem List Patient Active Problem List   Diagnosis Date Noted  . Wrist fracture 06/26/2016  . Humeral fracture 06/26/2016  . Encounter for screening colonoscopy 12/31/2015  . Memory difficulty 06/03/2014  . Headache disorder 06/03/2014  . Atrial flutter ([place]) 05/09/2013  . COPD (chronic obstructive pulmonary disease) ([place]) 05/09/2013  . Tobacco abuse 05/08/2013  . LBBB (left bundle branch block)- intermittent 05/08/2013  . Chronic diarrhea 05/11/2011  . Chronic nausea 05/11/2011  . Dyspepsia 02/24/2011  . IBS (irritable bowel syndrome) 02/24/2011  . Chest pain 12/05/2010  . Hypertension 12/05/2010   Guadelupe Sabin, OTR/L  [phone] 07/14/2016, 4:06 PM  [place] [address] [place], [place],  [address] Phone: [phone]   Fax:  [phone]  Name: Melanie Bradley MRN: [id number] Date of Birth: 1950/05/13

## 2016-07-18 ENCOUNTER — Ambulatory Visit (HOSPITAL_COMMUNITY): Payer: BLUE CROSS/BLUE SHIELD | Admitting: Occupational Therapy

## 2016-07-18 ENCOUNTER — Encounter (HOSPITAL_COMMUNITY): Payer: Self-pay | Admitting: Occupational Therapy

## 2016-07-18 DIAGNOSIS — R29898 Other symptoms and signs involving the musculoskeletal system: Secondary | ICD-10-CM

## 2016-07-18 DIAGNOSIS — M25632 Stiffness of left wrist, not elsewhere classified: Secondary | ICD-10-CM

## 2016-07-18 DIAGNOSIS — M25532 Pain in left wrist: Secondary | ICD-10-CM | POA: Diagnosis not present

## 2016-07-18 DIAGNOSIS — M25512 Pain in left shoulder: Secondary | ICD-10-CM

## 2016-07-18 DIAGNOSIS — M25612 Stiffness of left shoulder, not elsewhere classified: Secondary | ICD-10-CM

## 2016-07-18 NOTE — Therapy (Signed)
[place], [place], [address] Phone: [phone]   Fax:  [phone]  Occupational Therapy Treatment  Patient Details  Name: Melanie Bradley MRN: [id number] Date of Birth: February 06, 1951 Referring Provider: Dr. Meredith Pel  Encounter Date: 07/18/2016      OT End of Session - 07/18/16 1557    Visit Number 16   Number of Visits 30   Date for OT Re-Evaluation 08/06/16   Authorization Type [employer]   Authorization Time Period 30 visit limit   Authorization - Visit Number 16   Authorization - Number of Visits 30   OT Start Time 3154   OT Stop Time 1555   OT Time Calculation (min) 82 min   Activity Tolerance Patient tolerated treatment well   Behavior During Therapy [facility] for tasks assessed/performed      Past Medical History:  Diagnosis Date  . Atrial fibrillation ([place])   . Bloating   . Complication of anesthesia     " hard to wake up sometimes "  . Dementia   . Diverticula of colon   . Epigastric burning sensation   . GERD (gastroesophageal reflux disease)   . Headache disorder 06/03/2014  . Headache(784.0)   . Helicobacter pylori gastritis 2004  . Hemorrhoids 06/26/03   Dr. Laural Golden tcs  . Hiatal hernia 06/26/03   Dr. Laural Golden egd  . Hypertension   . Hypothyroidism   . 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 Memory difficulty 06/03/2014  . Shoulder fracture, left   . Spastic colon   . Wrist fracture     Past Surgical History:  Procedure Laterality Date  . APPENDECTOMY    . CHOLECYSTECTOMY    . COLONOSCOPY  06/26/03   Dr. Kem Boroughs diverticula at the sigmoid colon with one of the transverse colon, normal terminal ileoscopy, small external hemorrhoids the  . COLONOSCOPY N/A 01/20/2016   Procedure: COLONOSCOPY;  Surgeon: Daneil Dolin, MD;  [facility];  Service: Endoscopy;  Laterality: N/A;  2:30 pm  . ESOPHAGOGASTRODUODENOSCOPY  03/15/2011   Dr. Trevor Iha hiatal  hernia, abnormal gastric mucosa of unclear significance. Gastric biopsies negative, no H. pylori.Procedure: ESOPHAGOGASTRODUODENOSCOPY (EGD);  Surgeon: Daneil Dolin, MD;  [facility];  Service: Endoscopy;  Laterality: N/A;  7:30  . ORIF HUMERUS FRACTURE Left 05/29/2016   Procedure: LEFT OPEN REDUCTION INTERNAL FIXATION (ORIF) PROXIMAL HUMERUS FRACTURE;  Surgeon: Meredith Pel, MD;  Location: [place];  Service: Orthopedics;  Laterality: Left;  . ORIF WRIST FRACTURE Left 05/29/2016   Procedure: OPEN REDUCTION INTERNAL FIXATION (ORIF) LEFT WRIST FRACTURE;  Surgeon: Meredith Pel, MD;  Location: [place];  Service: Orthopedics;  Laterality: Left;  . TUBAL LIGATION    . UPPER GASTROINTESTINAL ENDOSCOPY  06/26/03   Dr. Wynelle Bourgeois sliding hiatal hernia with 8 mm tongue of gastric type mucosa at distal esophagus bile in the stomach.    There were no vitals filed for this visit.      Subjective Assessment - 07/18/16 1433    Subjective  S: I've been using my arm some at home.    Currently in Pain? No/denies            [facility] OT Assessment - 07/18/16 1433      Assessment   Diagnosis s/p ORIF of left humerus fracture, ORIF left wrist     Precautions   Precautions Shoulder   Type of Shoulder Precautions Per MD:  Begin with P/ROM and progress as tolerated.                   OT Treatments/Exercises (OP) - 07/18/16 1436      Exercises   Exercises Shoulder;Elbow;Wrist;Hand     Shoulder Exercises: Supine   Protraction PROM;5 reps   Horizontal ABduction PROM;5 reps   External Rotation PROM;5 reps   Internal Rotation PROM;5 reps   Flexion PROM;5 reps   ABduction PROM;5 reps     Shoulder Exercises: Seated   Protraction AROM;15 reps   Horizontal ABduction AROM;15 reps   External Rotation AROM;15 reps   Internal Rotation AROM;15 reps   Flexion AROM;15 reps   Abduction AROM;15 reps     Shoulder Exercises: Standing   Extension Theraband;15 reps   Theraband  Level (Shoulder Extension) Level 2 (Red)   Row Theraband;15 reps   Theraband Level (Shoulder Row) Level 2 (Red)   Retraction Theraband;15 reps   Theraband Level (Shoulder Retraction) Level 2 (Red)     Shoulder Exercises: ROM/Strengthening   UBE (Upper Arm Bike) Level 1 2' forward 2' reverse   Wall Wash 1'   X to V Arms 15X   Proximal Shoulder Strengthening, Seated 15X each no rest breaks  visual cuing for technique     Elbow Exercises   Forearm Supination PROM;5 reps;Strengthening;10 reps  1#   Forearm Pronation PROM;5 reps;Strengthening;10 reps  1#   Wrist Flexion PROM;5 reps;Strengthening;10 reps   Bar Weights/Barbell (Wrist Flexion) 1 lb   Wrist Extension PROM;5 reps;Strengthening;10 reps   Bar Weights/Barbell (Wrist Extension) 1 lb     Additional Elbow Exercises   Theraputty Flatten   Theraputty - Flatten red-using heel of hand working on wrist extension   Theraputty - Roll red   Theraputty - Grip red   Hand Gripper with Large Beads all beads 25#   Hand Gripper with Medium Beads all beads 25#   Hand Gripper with Small Beads all beads 22#     Weighted Stretch Over Towel Roll   Supination - Weighted Stretch 2 pounds;60 seconds   Wrist Flexion - Weighted Stretch 2 pounds;60 seconds   Wrist Extension - Weighted Stretch 2 pounds;60 seconds     Wrist Exercises   Other wrist exercises pt used pvc pipe to cut circles in red putty-min/mod difficulty, fatigue at end of task     Additional Wrist Exercises   Theraputty - Pinch red     Modalities   Modalities Ultrasound     Ultrasound   Ultrasound Location scar along anterior shoulder and deltoid   Ultrasound Parameters 3.10mhz, 1.5 w/cm2   Ultrasound Goals --  decrease scar restrictions and fascial restrictions     Manual Therapy   Manual Therapy Myofascial release;Muscle Energy Technique   Manual therapy comments Completed separately from therapeutic exercise   Myofascial Release Myofascial release to left wrist, volar  and dorsal forearm, elbow, upper arm, trapezius, and scapularis regions to decrease pain and fascial restrictions and increase joint range of motion. Scar massage to incisions to improve scar mobility and reduce fascial restrictions.    Muscle Energy Technique Muscle energy technqiue performed to decrease muscle spasm and increase range of motion                  OT Short Term Goals - 07/05/16 1639      OT SHORT TERM GOAL #1   Title Pt will be educated on HEP to improve LUE use during daily tasks.  Time 6   Period Weeks   Status On-going     OT SHORT TERM GOAL #2   Title Pt will decrease LUE pain to 5/10 to improve ability to assist with daily task completion.    Time 6   Period Weeks   Status Achieved     OT SHORT TERM GOAL #3   Title Pt will decrease LUE fascial restrictions from max to moderate amounts to improve mobility required for functional reaching tasks.    Time 6   Period Weeks   Status Achieved     OT SHORT TERM GOAL #4   Title Pt will improve left shoulder P/ROM to [facility] to improve ability to assist with donning shirts.    Time 6   Period Weeks   Status Achieved     OT SHORT TERM GOAL #5   Title Pt will improve left elbow A/ROM to WNL to improve ability to use LUE as assist during meals.    Time 6   Period Weeks   Status On-going     OT SHORT TERM GOAL #6   Title Pt will improve left wrist P/ROM to [facility] to improve ability to assist with grooming tasks.    Time 6   Period Weeks   Status Achieved     OT SHORT TERM GOAL #7   Title Pt will improve LUE strength to 3-/5 to improve ability to use LUE as assist during bathing tasks.    Time 6   Period Weeks   Status Achieved     OT SHORT TERM GOAL #8   Title Pt will increase LUE strength by 10# to improve ability to grasp a cup with LUE.    Time 6   Period Weeks   Status Achieved     OT SHORT TERM GOAL  #9   TITLE Pt will improve left pinch strength by 2# to improve ability to grasp utensils/tools  with LUE.    Time 6   Period Weeks   Status Achieved           OT Long Term Goals - 07/05/16 1640      OT LONG TERM GOAL #1   Title Pt will return to prior level of functioning and independence in ADL and leisure tasks use LUE as assist/non-dominant arm.    Time 12   Period Weeks   Status On-going     OT LONG TERM GOAL #2   Title Pt will decrease LUE pain to 3/10 or less to improve ability to use LUE for functional task completion.    Time 12   Period Weeks   Status On-going     OT LONG TERM GOAL #3   Title Pt will decrease LUE fascial restrictions from mod to min amounts or less to improve mobility required for overhead reaching.    Time 12   Period Weeks   Status On-going     OT LONG TERM GOAL #4   Title Pt will improve left shoulder A/ROM to [facility] to improve ability to wash hair.    Time 12   Period Weeks   Status On-going     OT LONG TERM GOAL #5   Title Pt will improve left wrist A/ROM to [facility] to improve ability to work in flowers.    Time 12   Period Weeks   Status Achieved     OT LONG TERM GOAL #6   Title Pt will improve LUE strength to 4/5 to improve ability  to perform yardwork tasks.    Time 12   Period Weeks   Status On-going     OT LONG TERM GOAL #7   Title Pt will improve left grip strength by 15# to improve ability to grasp plates with left hand.    Time 12   Period Weeks   Status On-going     OT LONG TERM GOAL #8   Title Pt will improve left pinch strength by 5# to improve ability to grasp cooking supplies with left hand.    Time 12   Period Weeks   Status On-going               Plan - 07/18/16 1558    Clinical Impression Statement A: Increased A/ROM and theraband repetitions this session, added wrist strengthening and increased gripper resistance. Pt tolerated strengthening exercises well, no discomfort noted. Increased to red putty for wrist extension with flattening, mod difficulty and cuing to use heel of hand. Pt required verbal  cuing for form and technique.    Plan P: Add A/ROM in sidelying, add ball on wall if pt able to complete   OT Home Exercise Plan 4/25: shoulder pendulums, elbow flexion/extension, finger/hand A/ROM; 5/9: AA/ROM shoulder; 5/16: wrist A/ROM; 5/23: yellow theraputty; 5/29: shoulder stretches   Consulted and Agree with Plan of Care Patient      Patient will benefit from skilled therapeutic intervention in order to improve the following deficits and impairments:  Decreased scar mobility, Decreased knowledge of precautions, Decreased activity tolerance, Decreased strength, Impaired flexibility, Decreased range of motion, Pain, Increased edema, Decreased coordination, Increased fascial restricitons, Impaired UE functional use  Visit Diagnosis: Pain in left wrist  Acute pain of left shoulder  Stiffness of left wrist, not elsewhere classified  Stiffness of left shoulder, not elsewhere classified  Other symptoms and signs involving the musculoskeletal system    Problem List Patient Active Problem List   Diagnosis Date Noted  . Wrist fracture 06/26/2016  . Humeral fracture 06/26/2016  . Encounter for screening colonoscopy 12/31/2015  . Memory difficulty 06/03/2014  . Headache disorder 06/03/2014  . Atrial flutter ([place]) 05/09/2013  . COPD (chronic obstructive pulmonary disease) ([place]) 05/09/2013  . Tobacco abuse 05/08/2013  . LBBB (left bundle branch block)- intermittent 05/08/2013  . Chronic diarrhea 05/11/2011  . Chronic nausea 05/11/2011  . Dyspepsia 02/24/2011  . IBS (irritable bowel syndrome) 02/24/2011  . Chest pain 12/05/2010  . Hypertension 12/05/2010   Guadelupe Sabin, OTR/L  [phone] 07/18/2016, 4:01 PM  [place] [address] [place], [place], [address] Phone: [phone]   Fax:  [phone]  Name: Melanie Bradley MRN: [id number] Date of Birth: 05-31-1950

## 2016-07-19 ENCOUNTER — Encounter (HOSPITAL_COMMUNITY): Payer: BLUE CROSS/BLUE SHIELD | Admitting: Occupational Therapy

## 2016-07-21 ENCOUNTER — Ambulatory Visit (HOSPITAL_COMMUNITY): Payer: BLUE CROSS/BLUE SHIELD | Admitting: Occupational Therapy

## 2016-07-21 ENCOUNTER — Other Ambulatory Visit: Payer: Self-pay | Admitting: Adult Health

## 2016-07-24 ENCOUNTER — Ambulatory Visit (INDEPENDENT_AMBULATORY_CARE_PROVIDER_SITE_OTHER): Payer: BLUE CROSS/BLUE SHIELD | Admitting: Orthopedic Surgery

## 2016-07-24 ENCOUNTER — Other Ambulatory Visit: Payer: Self-pay | Admitting: Adult Health

## 2016-07-24 ENCOUNTER — Ambulatory Visit (INDEPENDENT_AMBULATORY_CARE_PROVIDER_SITE_OTHER): Payer: Self-pay

## 2016-07-24 ENCOUNTER — Ambulatory Visit (INDEPENDENT_AMBULATORY_CARE_PROVIDER_SITE_OTHER): Payer: BLUE CROSS/BLUE SHIELD

## 2016-07-24 ENCOUNTER — Encounter (INDEPENDENT_AMBULATORY_CARE_PROVIDER_SITE_OTHER): Payer: Self-pay | Admitting: Orthopedic Surgery

## 2016-07-24 DIAGNOSIS — S62102D Fracture of unspecified carpal bone, left wrist, subsequent encounter for fracture with routine healing: Secondary | ICD-10-CM

## 2016-07-24 DIAGNOSIS — S42202D Unspecified fracture of upper end of left humerus, subsequent encounter for fracture with routine healing: Secondary | ICD-10-CM

## 2016-07-25 ENCOUNTER — Encounter (HOSPITAL_COMMUNITY): Payer: BLUE CROSS/BLUE SHIELD | Admitting: Specialist

## 2016-07-26 ENCOUNTER — Other Ambulatory Visit (HOSPITAL_COMMUNITY): Payer: Self-pay | Admitting: Endocrinology

## 2016-07-26 ENCOUNTER — Other Ambulatory Visit: Payer: Self-pay

## 2016-07-26 ENCOUNTER — Encounter (HOSPITAL_COMMUNITY): Payer: Self-pay | Admitting: Occupational Therapy

## 2016-07-26 ENCOUNTER — Other Ambulatory Visit: Payer: Self-pay | Admitting: Adult Health

## 2016-07-26 ENCOUNTER — Ambulatory Visit (HOSPITAL_COMMUNITY): Payer: BLUE CROSS/BLUE SHIELD | Admitting: Occupational Therapy

## 2016-07-26 DIAGNOSIS — R29898 Other symptoms and signs involving the musculoskeletal system: Secondary | ICD-10-CM

## 2016-07-26 DIAGNOSIS — M859 Disorder of bone density and structure, unspecified: Secondary | ICD-10-CM

## 2016-07-26 DIAGNOSIS — M25512 Pain in left shoulder: Secondary | ICD-10-CM

## 2016-07-26 DIAGNOSIS — M25632 Stiffness of left wrist, not elsewhere classified: Secondary | ICD-10-CM

## 2016-07-26 DIAGNOSIS — M25532 Pain in left wrist: Secondary | ICD-10-CM | POA: Diagnosis not present

## 2016-07-26 DIAGNOSIS — M25612 Stiffness of left shoulder, not elsewhere classified: Secondary | ICD-10-CM

## 2016-07-26 NOTE — Progress Notes (Signed)
Post-Op Visit Note   Patient: Melanie Bradley           Date of Birth: 05-02-1950           MRN: [id number] Visit Date: 07/24/2016 PCP: Sharilyn Sites, MD   Assessment & Plan:  Chief Complaint:  Chief Complaint  Patient presents with  . Left Wrist - Follow-up, Routine Post Op, Fracture  . Left Shoulder - Follow-up, Routine Post Op, Fracture   Visit Diagnoses:  1. Closed fracture of left wrist with routine healing, subsequent encounter   2. Closed fracture of proximal end of left humerus with routine healing, unspecified fracture morphology, subsequent encounter     Plan: Melanie Bradley is a 66 year old patient who is now about 2 months out left humerus and left wrist open reduction internal fixation.  She's been doing well.  She is in physical therapy 2 times a week.  Not taking any medication for her wrist or shoulder.  On exam she has forward flexion and abduction above 90 on the shoulder.  Wrist has about 15 of full supination but does have full pronation.  She's regained about half of her flexion-extension arc range of motion compared to the right-hand side.  Grip strength is improving on the left.  All incisions are intact.  Plan at this time is that Melanie Bradley is doing well with her surgery.  I don't want her doing any lifting more than 10 pounds until she reaches 3 months postop.  In the meantime ADLs with less than 1 pound of lifting is okay.  I'm going to see her back as needed  Follow-Up Instructions: Return if symptoms worsen or fail to improve.   Orders:  Orders Placed This Encounter  Procedures  . XR Wrist Complete Left  . XR Humerus Left   No orders of the defined types were placed in this encounter.   Imaging: No results found.  PMFS History: Patient Active Problem List   Diagnosis Date Noted  . Wrist fracture 06/26/2016  . Humeral fracture 06/26/2016  . Encounter for screening colonoscopy 12/31/2015  . Memory difficulty 06/03/2014  . Headache disorder  06/03/2014  . Atrial flutter ([place]) 05/09/2013  . COPD (chronic obstructive pulmonary disease) ([place]) 05/09/2013  . Tobacco abuse 05/08/2013  . LBBB (left bundle branch block)- intermittent 05/08/2013  . Chronic diarrhea 05/11/2011  . Chronic nausea 05/11/2011  . Dyspepsia 02/24/2011  . IBS (irritable bowel syndrome) 02/24/2011  . Chest pain 12/05/2010  . Hypertension 12/05/2010   Past Medical History:  Diagnosis Date  . Atrial fibrillation ([place])   . Bloating   . Complication of anesthesia     " hard to wake up sometimes "  . Dementia   . Diverticula of colon   . Epigastric burning sensation   . GERD (gastroesophageal reflux disease)   . Headache disorder 06/03/2014  . Headache(784.0)   . Helicobacter pylori gastritis 2004  . Hemorrhoids 06/26/03   Dr. Laural Golden tcs  . Hiatal hernia 06/26/03   Dr. Laural Golden egd  . Hypertension   . Hypothyroidism   . 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 Memory difficulty 06/03/2014  . Shoulder fracture, left   . Spastic colon   . Wrist fracture     Family History  Problem Relation Age of Onset  . Hypertension Mother   . Angina Mother   . COPD Father   . Heart failure Father   . Cancer Sister   . Dementia Sister   .  Cardiomyopathy Brother   . Pneumonia Other   . Migraines Sister   . Colon cancer Neg Hx     Past Surgical History:  Procedure Laterality Date  . APPENDECTOMY    . CHOLECYSTECTOMY    . COLONOSCOPY  06/26/03   Dr. Kem Boroughs diverticula at the sigmoid colon with one of the transverse colon, normal terminal ileoscopy, small external hemorrhoids the  . COLONOSCOPY N/A 01/20/2016   Procedure: COLONOSCOPY;  Surgeon: Daneil Dolin, MD;  [facility];  Service: Endoscopy;  Laterality: N/A;  2:30 pm  . ESOPHAGOGASTRODUODENOSCOPY  03/15/2011   Dr. Trevor Iha hiatal hernia, abnormal gastric mucosa of unclear significance. Gastric biopsies negative, no H. pylori.Procedure: ESOPHAGOGASTRODUODENOSCOPY  (EGD);  Surgeon: Daneil Dolin, MD;  [facility];  Service: Endoscopy;  Laterality: N/A;  7:30  . ORIF HUMERUS FRACTURE Left 05/29/2016   Procedure: LEFT OPEN REDUCTION INTERNAL FIXATION (ORIF) PROXIMAL HUMERUS FRACTURE;  Surgeon: Meredith Pel, MD;  Location: [place];  Service: Orthopedics;  Laterality: Left;  . ORIF WRIST FRACTURE Left 05/29/2016   Procedure: OPEN REDUCTION INTERNAL FIXATION (ORIF) LEFT WRIST FRACTURE;  Surgeon: Meredith Pel, MD;  Location: [place];  Service: Orthopedics;  Laterality: Left;  . TUBAL LIGATION    . UPPER GASTROINTESTINAL ENDOSCOPY  06/26/03   Dr. Wynelle Bourgeois sliding hiatal hernia with 8 mm tongue of gastric type mucosa at distal esophagus bile in the stomach.   Social History   Occupational History  .     Social History Main Topics  . Smoking status: Current Every Day Smoker    Packs/day: 1.50    Years: 45.00    Types: Cigarettes    Start date: 09/10/1966  . Smokeless tobacco: Never Used  . Alcohol use No  . Drug use: No  . Sexual activity: Yes

## 2016-07-26 NOTE — Therapy (Signed)
[place], [place], [address] Phone: [phone]   Fax:  [phone]  Occupational Therapy Treatment  Patient Details  Name: Melanie Bradley MRN: [id number] Date of Birth: 1950-07-17 Referring Provider: Dr. Meredith Pel  Encounter Date: 07/26/2016      OT End of Session - 07/26/16 1600    Visit Number 17   Number of Visits 30   Date for OT Re-Evaluation 08/06/16   Authorization Type BCBS   Authorization Time Period 30 visit limit   Authorization - Visit Number 17   Authorization - Number of Visits 30   OT Start Time 4332   OT Stop Time 1558   OT Time Calculation (min) 83 min   Activity Tolerance Patient tolerated treatment well   Behavior During Therapy [facility] for tasks assessed/performed      Past Medical History:  Diagnosis Date  . Atrial fibrillation ([place])   . Bloating   . Complication of anesthesia     " hard to wake up sometimes "  . Dementia   . Diverticula of colon   . Epigastric burning sensation   . GERD (gastroesophageal reflux disease)   . Headache disorder 06/03/2014  . Headache(784.0)   . Helicobacter pylori gastritis 2004  . Hemorrhoids 06/26/03   Dr. Laural Golden tcs  . Hiatal hernia 06/26/03   Dr. Laural Golden egd  . Hypertension   . Hypothyroidism   . 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 Memory difficulty 06/03/2014  . Shoulder fracture, left   . Spastic colon   . Wrist fracture     Past Surgical History:  Procedure Laterality Date  . APPENDECTOMY    . CHOLECYSTECTOMY    . COLONOSCOPY  06/26/03   Dr. Kem Boroughs diverticula at the sigmoid colon with one of the transverse colon, normal terminal ileoscopy, small external hemorrhoids the  . COLONOSCOPY N/A 01/20/2016   Procedure: COLONOSCOPY;  Surgeon: Daneil Dolin, MD;  [facility];  Service: Endoscopy;  Laterality: N/A;  2:30 pm  . ESOPHAGOGASTRODUODENOSCOPY  03/15/2011   Dr. Trevor Iha hiatal  hernia, abnormal gastric mucosa of unclear significance. Gastric biopsies negative, no H. pylori.Procedure: ESOPHAGOGASTRODUODENOSCOPY (EGD);  Surgeon: Daneil Dolin, MD;  [facility];  Service: Endoscopy;  Laterality: N/A;  7:30  . ORIF HUMERUS FRACTURE Left 05/29/2016   Procedure: LEFT OPEN REDUCTION INTERNAL FIXATION (ORIF) PROXIMAL HUMERUS FRACTURE;  Surgeon: Meredith Pel, MD;  Location: [place];  Service: Orthopedics;  Laterality: Left;  . ORIF WRIST FRACTURE Left 05/29/2016   Procedure: OPEN REDUCTION INTERNAL FIXATION (ORIF) LEFT WRIST FRACTURE;  Surgeon: Meredith Pel, MD;  Location: [place];  Service: Orthopedics;  Laterality: Left;  . TUBAL LIGATION    . UPPER GASTROINTESTINAL ENDOSCOPY  06/26/03   Dr. Wynelle Bourgeois sliding hiatal hernia with 8 mm tongue of gastric type mucosa at distal esophagus bile in the stomach.    There were no vitals filed for this visit.      Subjective Assessment - 07/26/16 1438    Subjective  S: I was mowing this morning.    Currently in Pain? No/denies            [facility] OT Assessment - 07/26/16 1437      Assessment   Diagnosis s/p ORIF of left humerus fracture, ORIF left wrist     Precautions   Precautions Shoulder   Type of Shoulder Precautions Per MD: Begin with P/ROM  and progress as tolerated.                   OT Treatments/Exercises (OP) - 07/26/16 1438      Exercises   Exercises Shoulder;Elbow;Wrist;Hand     Shoulder Exercises: Supine   Protraction PROM;5 reps   Horizontal ABduction PROM;5 reps   External Rotation PROM;5 reps   Internal Rotation PROM;5 reps   Flexion PROM;5 reps   ABduction PROM;5 reps     Shoulder Exercises: Seated   Protraction Strengthening;10 reps   Protraction Weight (lbs) 1   Horizontal ABduction Strengthening;10 reps   Horizontal ABduction Weight (lbs) 1   External Rotation Strengthening;10 reps   External Rotation Weight (lbs) 1   Internal Rotation Strengthening;10  reps   Internal Rotation Weight (lbs) 1   Flexion Strengthening;10 reps   Flexion Weight (lbs) 1   Abduction Strengthening;10 reps   ABduction Weight (lbs) 1     Shoulder Exercises: Sidelying   External Rotation AROM;10 reps   Internal Rotation AROM;10 reps   Flexion AROM;10 reps   ABduction AROM;10 reps   Other Sidelying Exercises protraction, 10X, A/ROM   Other Sidelying Exercises horizontal abduction, 10X, A/ROM     Shoulder Exercises: Standing   Other Standing Exercises golf swing, 10X     Shoulder Exercises: ROM/Strengthening   X to V Arms 15X, 1# weight   Proximal Shoulder Strengthening, Seated 10X, 1# weight, no rest breaks  visual cuing for technique   Ball on Wall 1' flexion     Shoulder Exercises: Stretch   Corner Stretch 2 reps;20 seconds   Internal Rotation Stretch 2 reps  20 seconds   Wall Stretch - Flexion 2 reps;20 seconds     Elbow Exercises   Forearm Supination PROM;5 reps;Strengthening;10 reps  1#   Forearm Pronation PROM;5 reps;Strengthening;10 reps  1#   Wrist Flexion PROM;5 reps;Strengthening;10 reps   Bar Weights/Barbell (Wrist Flexion) 1 lb   Wrist Extension PROM;5 reps;Strengthening;10 reps   Bar Weights/Barbell (Wrist Extension) 1 lb     Additional Elbow Exercises   Hand Gripper with Large Beads all beads 29#   Hand Gripper with Medium Beads all beads 29#   Hand Gripper with Small Beads 4/15 beads 25#; 11/15 beads 22#     Weighted Stretch Over Towel Roll   Supination - Weighted Stretch 2 pounds;60 seconds     Modalities   Modalities Ultrasound     Ultrasound   Ultrasound Location scar along anterior shoulder and deltoid   Ultrasound Parameters 3.58mhz, 1.5 W/cm2   Ultrasound Goals --  decrease scar restrictions and fascial restrictions     Manual Therapy   Manual Therapy Myofascial release;Muscle Energy Technique   Manual therapy comments Completed separately from therapeutic exercise   Myofascial Release Myofascial release to left  wrist, volar and dorsal forearm, elbow, upper arm, trapezius, and scapularis regions to decrease pain and fascial restrictions and increase joint range of motion. Scar massage to incisions to improve scar mobility and reduce fascial restrictions.    Muscle Energy Technique Muscle energy technqiue performed to decrease muscle spasm and increase range of motion                  OT Short Term Goals - 07/05/16 1639      OT SHORT TERM GOAL #1   Title Pt will be educated on HEP to improve LUE use during daily tasks.    Time 6   Period Weeks   Status On-going  OT SHORT TERM GOAL #2   Title Pt will decrease LUE pain to 5/10 to improve ability to assist with daily task completion.    Time 6   Period Weeks   Status Achieved     OT SHORT TERM GOAL #3   Title Pt will decrease LUE fascial restrictions from max to moderate amounts to improve mobility required for functional reaching tasks.    Time 6   Period Weeks   Status Achieved     OT SHORT TERM GOAL #4   Title Pt will improve left shoulder P/ROM to [facility] to improve ability to assist with donning shirts.    Time 6   Period Weeks   Status Achieved     OT SHORT TERM GOAL #5   Title Pt will improve left elbow A/ROM to WNL to improve ability to use LUE as assist during meals.    Time 6   Period Weeks   Status On-going     OT SHORT TERM GOAL #6   Title Pt will improve left wrist P/ROM to [facility] to improve ability to assist with grooming tasks.    Time 6   Period Weeks   Status Achieved     OT SHORT TERM GOAL #7   Title Pt will improve LUE strength to 3-/5 to improve ability to use LUE as assist during bathing tasks.    Time 6   Period Weeks   Status Achieved     OT SHORT TERM GOAL #8   Title Pt will increase LUE strength by 10# to improve ability to grasp a cup with LUE.    Time 6   Period Weeks   Status Achieved     OT SHORT TERM GOAL  #9   TITLE Pt will improve left pinch strength by 2# to improve ability to grasp  utensils/tools with LUE.    Time 6   Period Weeks   Status Achieved           OT Long Term Goals - 07/05/16 1640      OT LONG TERM GOAL #1   Title Pt will return to prior level of functioning and independence in ADL and leisure tasks use LUE as assist/non-dominant arm.    Time 12   Period Weeks   Status On-going     OT LONG TERM GOAL #2   Title Pt will decrease LUE pain to 3/10 or less to improve ability to use LUE for functional task completion.    Time 12   Period Weeks   Status On-going     OT LONG TERM GOAL #3   Title Pt will decrease LUE fascial restrictions from mod to min amounts or less to improve mobility required for overhead reaching.    Time 12   Period Weeks   Status On-going     OT LONG TERM GOAL #4   Title Pt will improve left shoulder A/ROM to [facility] to improve ability to wash hair.    Time 12   Period Weeks   Status On-going     OT LONG TERM GOAL #5   Title Pt will improve left wrist A/ROM to [facility] to improve ability to work in flowers.    Time 12   Period Weeks   Status Achieved     OT LONG TERM GOAL #6   Title Pt will improve LUE strength to 4/5 to improve ability to perform yardwork tasks.    Time 12   Period Weeks  Status On-going     OT LONG TERM GOAL #7   Title Pt will improve left grip strength by 15# to improve ability to grasp plates with left hand.    Time 12   Period Weeks   Status On-going     OT LONG TERM GOAL #8   Title Pt will improve left pinch strength by 5# to improve ability to grasp cooking supplies with left hand.    Time 12   Period Weeks   Status On-going               Plan - 07/26/16 1600    Clinical Impression Statement A: Pt went to MD and reports he is pleased with her progress, 10# lifting restriction until mid-July. Pt mowed for the first time this morning, no difficulties or pain in LUE. Added 1# weight for shoulder strengthening this session, as well as ball on wall. Pt with mod difficulty with ball on  wall due to cognition. Pt able to increase gripper resistance this session as well.    Plan P: Continue with shoulder strengthening, add 1# weight in sidelying. Resume UBE working on activity tolerance with LUE   OT Home Exercise Plan 4/25: shoulder pendulums, elbow flexion/extension, finger/hand A/ROM; 5/9: AA/ROM shoulder; 5/16: wrist A/ROM; 5/23: yellow theraputty; 5/29: shoulder stretches   Consulted and Agree with Plan of Care Patient      Patient will benefit from skilled therapeutic intervention in order to improve the following deficits and impairments:  Decreased scar mobility, Decreased knowledge of precautions, Decreased activity tolerance, Decreased strength, Impaired flexibility, Decreased range of motion, Pain, Increased edema, Decreased coordination, Increased fascial restricitons, Impaired UE functional use  Visit Diagnosis: Pain in left wrist  Acute pain of left shoulder  Stiffness of left wrist, not elsewhere classified  Stiffness of left shoulder, not elsewhere classified  Other symptoms and signs involving the musculoskeletal system    Problem List Patient Active Problem List   Diagnosis Date Noted  . Wrist fracture 06/26/2016  . Humeral fracture 06/26/2016  . Encounter for screening colonoscopy 12/31/2015  . Memory difficulty 06/03/2014  . Headache disorder 06/03/2014  . Atrial flutter ([place]) 05/09/2013  . COPD (chronic obstructive pulmonary disease) ([place]) 05/09/2013  . Tobacco abuse 05/08/2013  . LBBB (left bundle branch block)- intermittent 05/08/2013  . Chronic diarrhea 05/11/2011  . Chronic nausea 05/11/2011  . Dyspepsia 02/24/2011  . IBS (irritable bowel syndrome) 02/24/2011  . Chest pain 12/05/2010  . Hypertension 12/05/2010   Guadelupe Sabin, OTR/L  [phone] 07/26/2016, 4:03 PM  [place] [address] [place], [place], [address] Phone: [phone]   Fax:  [phone]  Name: Melanie Bradley MRN: [id number] Date of Birth: 04/15/1950

## 2016-07-28 ENCOUNTER — Encounter (HOSPITAL_COMMUNITY): Payer: Self-pay | Admitting: Occupational Therapy

## 2016-07-28 ENCOUNTER — Telehealth: Payer: Self-pay | Admitting: Adult Health

## 2016-07-28 ENCOUNTER — Ambulatory Visit (HOSPITAL_COMMUNITY): Payer: BLUE CROSS/BLUE SHIELD | Admitting: Occupational Therapy

## 2016-07-28 DIAGNOSIS — M25612 Stiffness of left shoulder, not elsewhere classified: Secondary | ICD-10-CM

## 2016-07-28 DIAGNOSIS — R29898 Other symptoms and signs involving the musculoskeletal system: Secondary | ICD-10-CM

## 2016-07-28 DIAGNOSIS — M25632 Stiffness of left wrist, not elsewhere classified: Secondary | ICD-10-CM

## 2016-07-28 DIAGNOSIS — M25532 Pain in left wrist: Secondary | ICD-10-CM

## 2016-07-28 DIAGNOSIS — M25512 Pain in left shoulder: Secondary | ICD-10-CM

## 2016-07-28 MED ORDER — DONEPEZIL HCL 10 MG PO TABS: 10.0000 mg | ORAL_TABLET | Freq: Every day | ORAL | 3 refills | Status: DC

## 2016-07-28 MED ORDER — MEMANTINE HCL 10 MG PO TABS: 10.0000 mg | ORAL_TABLET | Freq: Two times a day (BID) | ORAL | 3 refills | Status: DC

## 2016-07-28 NOTE — Telephone Encounter (Signed)
Patients husband called office in reference to memantine (NAMENDA) 10 MG tablet and donepezil (ARICEPT) 10 MG tablet.  [facility] advised husband they do not have the prescription refills for these medication and they can not fill.  Patient will be out of the medication today.  Please call pharmacy to refill per patient.

## 2016-07-28 NOTE — Therapy (Signed)
[place], [place], [address] Phone: [phone]   Fax:  [phone]  Occupational Therapy Treatment  Patient Details  Name: Melanie Bradley MRN: [id number] Date of Birth: 12-27-1950 Referring Provider: Dr. Meredith Pel  Encounter Date: 07/28/2016      OT End of Session - 07/28/16 1554    Visit Number 18   Number of Visits 30   Date for OT Re-Evaluation 08/06/16   Authorization Type BCBS   Authorization Time Period 30 visit limit   Authorization - Visit Number 18   Authorization - Number of Visits 30   OT Start Time 9381   OT Stop Time 1547   OT Time Calculation (min) 79 min   Activity Tolerance Patient tolerated treatment well   Behavior During Therapy [facility] for tasks assessed/performed      Past Medical History:  Diagnosis Date  . Atrial fibrillation ([place])   . Bloating   . Complication of anesthesia     " hard to wake up sometimes "  . Dementia   . Diverticula of colon   . Epigastric burning sensation   . GERD (gastroesophageal reflux disease)   . Headache disorder 06/03/2014  . Headache(784.0)   . Helicobacter pylori gastritis 2004  . Hemorrhoids 06/26/03   Dr. Laural Golden tcs  . Hiatal hernia 06/26/03   Dr. Laural Golden egd  . Hypertension   . Hypothyroidism   . 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 Memory difficulty 06/03/2014  . Shoulder fracture, left   . Spastic colon   . Wrist fracture     Past Surgical History:  Procedure Laterality Date  . APPENDECTOMY    . CHOLECYSTECTOMY    . COLONOSCOPY  06/26/03   Dr. Kem Boroughs diverticula at the sigmoid colon with one of the transverse colon, normal terminal ileoscopy, small external hemorrhoids the  . COLONOSCOPY N/A 01/20/2016   Procedure: COLONOSCOPY;  Surgeon: Daneil Dolin, MD;  [facility];  Service: Endoscopy;  Laterality: N/A;  2:30 pm  . ESOPHAGOGASTRODUODENOSCOPY  03/15/2011   Dr. Trevor Iha hiatal  hernia, abnormal gastric mucosa of unclear significance. Gastric biopsies negative, no H. pylori.Procedure: ESOPHAGOGASTRODUODENOSCOPY (EGD);  Surgeon: Daneil Dolin, MD;  [facility];  Service: Endoscopy;  Laterality: N/A;  7:30  . ORIF HUMERUS FRACTURE Left 05/29/2016   Procedure: LEFT OPEN REDUCTION INTERNAL FIXATION (ORIF) PROXIMAL HUMERUS FRACTURE;  Surgeon: Meredith Pel, MD;  Location: [place];  Service: Orthopedics;  Laterality: Left;  . ORIF WRIST FRACTURE Left 05/29/2016   Procedure: OPEN REDUCTION INTERNAL FIXATION (ORIF) LEFT WRIST FRACTURE;  Surgeon: Meredith Pel, MD;  Location: [place];  Service: Orthopedics;  Laterality: Left;  . TUBAL LIGATION    . UPPER GASTROINTESTINAL ENDOSCOPY  06/26/03   Dr. Wynelle Bourgeois sliding hiatal hernia with 8 mm tongue of gastric type mucosa at distal esophagus bile in the stomach.    There were no vitals filed for this visit.      Subjective Assessment - 07/28/16 1433    Subjective  S: I hope I don't have to come much longer.    Currently in Pain? No/denies            [facility] OT Assessment - 07/28/16 1429      Assessment   Diagnosis s/p ORIF of left humerus fracture, ORIF left wrist     Precautions   Precautions Shoulder   Type of Shoulder Precautions Per  MD: Begin with P/ROM and progress as tolerated.                   OT Treatments/Exercises (OP) - 07/28/16 1433      Exercises   Exercises Shoulder;Elbow;Wrist;Hand     Shoulder Exercises: Supine   Protraction PROM;5 reps   Horizontal ABduction PROM;5 reps   External Rotation PROM;5 reps   Internal Rotation PROM;5 reps   Flexion PROM;5 reps   ABduction PROM;5 reps     Shoulder Exercises: Seated   Protraction Strengthening;10 reps   Protraction Weight (lbs) 1   Horizontal ABduction Strengthening;10 reps   Horizontal ABduction Weight (lbs) 1   External Rotation Strengthening;10 reps   External Rotation Weight (lbs) 1   Internal Rotation  Strengthening;10 reps   Internal Rotation Weight (lbs) 1   Flexion Strengthening;10 reps   Flexion Weight (lbs) 1   Abduction Strengthening;10 reps   ABduction Weight (lbs) 1     Shoulder Exercises: Sidelying   External Rotation Strengthening;10 reps   External Rotation Weight (lbs) 1   Internal Rotation Strengthening;10 reps   Internal Rotation Weight (lbs) 1   Flexion Strengthening;10 reps   Flexion Weight (lbs) 1   ABduction Strengthening;10 reps   ABduction Weight (lbs) 1   Other Sidelying Exercises protraction, 10X, 1#   Other Sidelying Exercises horizontal abduction, 10X, 1#     Shoulder Exercises: Standing   Extension Theraband;15 reps   Theraband Level (Shoulder Extension) Level 2 (Red)   Row Theraband;15 reps   Theraband Level (Shoulder Row) Level 2 (Red)   Retraction Theraband;15 reps   Theraband Level (Shoulder Retraction) Level 2 (Red)     Shoulder Exercises: ROM/Strengthening   UBE (Upper Arm Bike) Level 1 2' forward 2' reverse  cuing for speed and direction   X to V Arms 10X, 1# weight   Proximal Shoulder Strengthening, Seated 10X, 1# weight, no rest breaks  visual cuing for technique     Shoulder Exercises: Stretch   Corner Stretch 2 reps;20 seconds   Internal Rotation Stretch 2 reps  20 seconds   Wall Stretch - Flexion 2 reps;20 seconds     Elbow Exercises   Forearm Supination PROM;5 reps;Strengthening;10 reps  2#   Forearm Pronation PROM;5 reps;Strengthening;10 reps  2#   Wrist Flexion PROM;5 reps;Strengthening;10 reps   Bar Weights/Barbell (Wrist Flexion) 2 lbs   Wrist Extension PROM;5 reps;Strengthening;10 reps   Bar Weights/Barbell (Wrist Extension) 2 lbs     Additional Elbow Exercises   Theraputty Flatten   Theraputty - Flatten red-using heel of hand working on wrist extension   Theraputty - Grip red   Hand Gripper with Large Beads all beads 29#   Hand Gripper with Medium Beads all beads 29#   Hand Gripper with Small Beads all beads 25#  1  rest break     Weighted Stretch Over Towel Roll   Supination - Weighted Stretch 2 pounds;60 seconds   Wrist Flexion - Weighted Stretch 2 pounds;60 seconds   Wrist Extension - Weighted Stretch 2 pounds;60 seconds     Additional Wrist Exercises   Sponges Pt used green clothespin with four finger pinch to grasp 25 high resistance sponges and place in bucket. Mod difficulty, fatigue at end of task. 3 rest breaks   Theraputty - Locate Pegs red-6 pegs located     Manual Therapy   Manual Therapy Myofascial release;Muscle Energy Technique   Manual therapy comments Completed separately from therapeutic exercise   Myofascial Release Myofascial  release to left wrist, volar and dorsal forearm, elbow, upper arm, trapezius, and scapularis regions to decrease pain and fascial restrictions and increase joint range of motion. Scar massage to incisions to improve scar mobility and reduce fascial restrictions.    Muscle Energy Technique Muscle energy technqiue performed to decrease muscle spasm and increase range of motion                  OT Short Term Goals - 07/05/16 1639      OT SHORT TERM GOAL #1   Title Pt will be educated on HEP to improve LUE use during daily tasks.    Time 6   Period Weeks   Status On-going     OT SHORT TERM GOAL #2   Title Pt will decrease LUE pain to 5/10 to improve ability to assist with daily task completion.    Time 6   Period Weeks   Status Achieved     OT SHORT TERM GOAL #3   Title Pt will decrease LUE fascial restrictions from max to moderate amounts to improve mobility required for functional reaching tasks.    Time 6   Period Weeks   Status Achieved     OT SHORT TERM GOAL #4   Title Pt will improve left shoulder P/ROM to [facility] to improve ability to assist with donning shirts.    Time 6   Period Weeks   Status Achieved     OT SHORT TERM GOAL #5   Title Pt will improve left elbow A/ROM to WNL to improve ability to use LUE as assist during meals.     Time 6   Period Weeks   Status On-going     OT SHORT TERM GOAL #6   Title Pt will improve left wrist P/ROM to [facility] to improve ability to assist with grooming tasks.    Time 6   Period Weeks   Status Achieved     OT SHORT TERM GOAL #7   Title Pt will improve LUE strength to 3-/5 to improve ability to use LUE as assist during bathing tasks.    Time 6   Period Weeks   Status Achieved     OT SHORT TERM GOAL #8   Title Pt will increase LUE strength by 10# to improve ability to grasp a cup with LUE.    Time 6   Period Weeks   Status Achieved     OT SHORT TERM GOAL  #9   TITLE Pt will improve left pinch strength by 2# to improve ability to grasp utensils/tools with LUE.    Time 6   Period Weeks   Status Achieved           OT Long Term Goals - 07/05/16 1640      OT LONG TERM GOAL #1   Title Pt will return to prior level of functioning and independence in ADL and leisure tasks use LUE as assist/non-dominant arm.    Time 12   Period Weeks   Status On-going     OT LONG TERM GOAL #2   Title Pt will decrease LUE pain to 3/10 or less to improve ability to use LUE for functional task completion.    Time 12   Period Weeks   Status On-going     OT LONG TERM GOAL #3   Title Pt will decrease LUE fascial restrictions from mod to min amounts or less to improve mobility required for overhead reaching.  Time 12   Period Weeks   Status On-going     OT LONG TERM GOAL #4   Title Pt will improve left shoulder A/ROM to [facility] to improve ability to wash hair.    Time 12   Period Weeks   Status On-going     OT LONG TERM GOAL #5   Title Pt will improve left wrist A/ROM to [facility] to improve ability to work in flowers.    Time 12   Period Weeks   Status Achieved     OT LONG TERM GOAL #6   Title Pt will improve LUE strength to 4/5 to improve ability to perform yardwork tasks.    Time 12   Period Weeks   Status On-going     OT LONG TERM GOAL #7   Title Pt will improve left grip  strength by 15# to improve ability to grasp plates with left hand.    Time 12   Period Weeks   Status On-going     OT LONG TERM GOAL #8   Title Pt will improve left pinch strength by 5# to improve ability to grasp cooking supplies with left hand.    Time 12   Period Weeks   Status On-going               Plan - 07/28/16 1554    Clinical Impression Statement A: Continued with shoulder strengthening, added 1# weight in supine. Increased wrist strengthening to 2#, pt able to complete all small beads with gripper at 25# this session. Pt continues to require verbal cuing for form and technique.    Plan P: Attempt resistance band strengthening with shoulder   OT Home Exercise Plan 4/25: shoulder pendulums, elbow flexion/extension, finger/hand A/ROM; 5/9: AA/ROM shoulder; 5/16: wrist A/ROM; 5/23: yellow theraputty; 5/29: shoulder stretches   Consulted and Agree with Plan of Care Patient      Patient will benefit from skilled therapeutic intervention in order to improve the following deficits and impairments:  Decreased scar mobility, Decreased knowledge of precautions, Decreased activity tolerance, Decreased strength, Impaired flexibility, Decreased range of motion, Pain, Increased edema, Decreased coordination, Increased fascial restricitons, Impaired UE functional use  Visit Diagnosis: Pain in left wrist  Acute pain of left shoulder  Stiffness of left wrist, not elsewhere classified  Stiffness of left shoulder, not elsewhere classified  Other symptoms and signs involving the musculoskeletal system    Problem List Patient Active Problem List   Diagnosis Date Noted  . Wrist fracture 06/26/2016  . Humeral fracture 06/26/2016  . Encounter for screening colonoscopy 12/31/2015  . Memory difficulty 06/03/2014  . Headache disorder 06/03/2014  . Atrial flutter ([place]) 05/09/2013  . COPD (chronic obstructive pulmonary disease) ([place]) 05/09/2013  . Tobacco abuse 05/08/2013  . LBBB  (left bundle branch block)- intermittent 05/08/2013  . Chronic diarrhea 05/11/2011  . Chronic nausea 05/11/2011  . Dyspepsia 02/24/2011  . IBS (irritable bowel syndrome) 02/24/2011  . Chest pain 12/05/2010  . Hypertension 12/05/2010   Guadelupe Sabin, OTR/L  [phone] 07/28/2016, 3:56 PM  [place] [address] [place], [place], [address] Phone: [phone]   Fax:  [phone]  Name: Melanie Bradley MRN: [id number] Date of Birth: 1950-05-12

## 2016-07-28 NOTE — Telephone Encounter (Signed)
Called and spoke with [facility] from pt pharmacy. Advised refills for both were sent back on 06/20/16 for 1 year supply refills. Nothing shows in their system. Would like both re-sent. Advised I will e-scribe them both again.

## 2016-07-28 NOTE — Addendum Note (Signed)
Addended by: Rossie Muskrat L on: 07/28/2016 10:12 AM   Modules accepted: Orders

## 2016-08-01 ENCOUNTER — Encounter (HOSPITAL_COMMUNITY): Payer: Self-pay | Admitting: Occupational Therapy

## 2016-08-01 ENCOUNTER — Ambulatory Visit (HOSPITAL_COMMUNITY): Payer: BLUE CROSS/BLUE SHIELD | Admitting: Occupational Therapy

## 2016-08-01 DIAGNOSIS — R29898 Other symptoms and signs involving the musculoskeletal system: Secondary | ICD-10-CM

## 2016-08-01 DIAGNOSIS — M25632 Stiffness of left wrist, not elsewhere classified: Secondary | ICD-10-CM

## 2016-08-01 DIAGNOSIS — M25612 Stiffness of left shoulder, not elsewhere classified: Secondary | ICD-10-CM

## 2016-08-01 DIAGNOSIS — M25532 Pain in left wrist: Secondary | ICD-10-CM | POA: Diagnosis not present

## 2016-08-01 DIAGNOSIS — M25512 Pain in left shoulder: Secondary | ICD-10-CM

## 2016-08-01 NOTE — Therapy (Signed)
[place], [place], [address] Phone: [phone]   Fax:  [phone]  Occupational Therapy Treatment  Patient Details  Name: Melanie Bradley MRN: [id number] Date of Birth: 1950/03/02 Referring Provider: Dr. Meredith Pel  Encounter Date: 08/01/2016      OT End of Session - 08/01/16 1548    Visit Number 19   Number of Visits 30   Date for OT Re-Evaluation 08/06/16   Authorization Type [employer]   Authorization Time Period 30 visit limit   Authorization - Visit Number 19   Authorization - Number of Visits 30   OT Start Time 0998  pt arrived late   OT Stop Time 1548   OT Time Calculation (min) 71 min   Activity Tolerance Patient tolerated treatment well   Behavior During Therapy [facility] for tasks assessed/performed      Past Medical History:  Diagnosis Date  . Atrial fibrillation ([place])   . Bloating   . Complication of anesthesia     " hard to wake up sometimes "  . Dementia   . Diverticula of colon   . Epigastric burning sensation   . GERD (gastroesophageal reflux disease)   . Headache disorder 06/03/2014  . Headache(784.0)   . Helicobacter pylori gastritis 2004  . Hemorrhoids 06/26/03   Dr. Laural Golden [facility]  . Hiatal hernia 06/26/03   Dr. Laural Golden egd  . Hypertension   . Hypothyroidism   . 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 Memory difficulty 06/03/2014  . Shoulder fracture, left   . Spastic colon   . Wrist fracture     Past Surgical History:  Procedure Laterality Date  . APPENDECTOMY    . CHOLECYSTECTOMY    . COLONOSCOPY  06/26/03   Dr. Kem Boroughs diverticula at the sigmoid colon with one of the transverse colon, normal terminal ileoscopy, small external hemorrhoids the  . COLONOSCOPY N/A 01/20/2016   Procedure: COLONOSCOPY;  Surgeon: Daneil Dolin, MD;  [facility];  Service: Endoscopy;  Laterality: N/A;  2:30 pm  . ESOPHAGOGASTRODUODENOSCOPY  03/15/2011   Dr.  Trevor Iha hiatal hernia, abnormal gastric mucosa of unclear significance. Gastric biopsies negative, no H. pylori.Procedure: ESOPHAGOGASTRODUODENOSCOPY (EGD);  Surgeon: Daneil Dolin, MD;  [facility];  Service: Endoscopy;  Laterality: N/A;  7:30  . ORIF HUMERUS FRACTURE Left 05/29/2016   Procedure: LEFT OPEN REDUCTION INTERNAL FIXATION (ORIF) PROXIMAL HUMERUS FRACTURE;  Surgeon: Meredith Pel, MD;  Location: [place];  Service: Orthopedics;  Laterality: Left;  . ORIF WRIST FRACTURE Left 05/29/2016   Procedure: OPEN REDUCTION INTERNAL FIXATION (ORIF) LEFT WRIST FRACTURE;  Surgeon: Meredith Pel, MD;  Location: [place];  Service: Orthopedics;  Laterality: Left;  . TUBAL LIGATION    . UPPER GASTROINTESTINAL ENDOSCOPY  06/26/03   Dr. Wynelle Bourgeois sliding hiatal hernia with 8 mm tongue of gastric type mucosa at distal esophagus bile in the stomach.    There were no vitals filed for this visit.      Subjective Assessment - 08/01/16 1437    Subjective  S: I can always call you if I have a problem.    Currently in Pain? No/denies            [facility] OT Assessment - 08/01/16 1437      Assessment   Diagnosis s/p ORIF of left humerus fracture, ORIF left wrist     Precautions   Precautions Shoulder  Type of Shoulder Precautions Per MD: Begin with P/ROM and progress as tolerated.                   OT Treatments/Exercises (OP) - 08/01/16 1439      Exercises   Exercises Shoulder;Elbow;Wrist;Hand     Shoulder Exercises: Supine   Protraction PROM;5 reps   Horizontal ABduction PROM;5 reps   External Rotation PROM;5 reps   Internal Rotation PROM;5 reps   Flexion PROM;5 reps   ABduction PROM;5 reps     Shoulder Exercises: Seated   Protraction Strengthening;15 reps   Protraction Weight (lbs) 1   Horizontal ABduction Strengthening;15 reps   Horizontal ABduction Weight (lbs) 1   External Rotation Strengthening;15 reps   External Rotation Weight (lbs) 1    Internal Rotation Strengthening;15 reps   Internal Rotation Weight (lbs) 1   Flexion Strengthening;15 reps   Flexion Weight (lbs) 1   Abduction Strengthening;15 reps   ABduction Weight (lbs) 1     Shoulder Exercises: Standing   Extension Theraband;15 reps   Theraband Level (Shoulder Extension) Level 2 (Red)   Row Theraband;15 reps   Theraband Level (Shoulder Row) Level 2 (Red)   Retraction Theraband;15 reps   Theraband Level (Shoulder Retraction) Level 2 (Red)   Other Standing Exercises golf swing, 10X     Shoulder Exercises: ROM/Strengthening   UBE (Upper Arm Bike) Level 1 3' forward 3' reverse  cuing for speed and direction   Over Head Lace 2'   X to V Arms 15X, 1# weight   Proximal Shoulder Strengthening, Seated 15X, 1# weight, no rest breaks  verbal cuing for technique     Shoulder Exercises: Stretch   Corner Stretch 2 reps;20 seconds   Internal Rotation Stretch 2 reps  20 seconds   Wall Stretch - Flexion 2 reps;20 seconds     Elbow Exercises   Forearm Supination PROM;5 reps;Strengthening;15 reps  2#   Forearm Pronation PROM;5 reps;Strengthening;15 reps  2#   Wrist Flexion PROM;5 reps;Strengthening;15 reps   Bar Weights/Barbell (Wrist Flexion) 2 lbs   Wrist Extension PROM;5 reps;Strengthening;15 reps   Bar Weights/Barbell (Wrist Extension) 2 lbs     Additional Elbow Exercises   Hand Gripper with Large Beads all beads 35#   Hand Gripper with Medium Beads all beads 29#   Hand Gripper with Small Beads all beads 25#     Weighted Stretch Over Towel Roll   Supination - Weighted Stretch 2 pounds;60 seconds   Wrist Flexion - Weighted Stretch 2 pounds;60 seconds   Wrist Extension - Weighted Stretch 2 pounds;60 seconds     Manual Therapy   Manual Therapy Myofascial release;Muscle Energy Technique   Manual therapy comments Completed separately from therapeutic exercise   Myofascial Release Myofascial release to left wrist, volar and dorsal forearm, elbow, upper arm,  trapezius, and scapularis regions to decrease pain and fascial restrictions and increase joint range of motion. Scar massage to incisions to improve scar mobility and reduce fascial restrictions.    Muscle Energy Technique Muscle energy technqiue performed to decrease muscle spasm and increase range of motion                  OT Short Term Goals - 07/05/16 1639      OT SHORT TERM GOAL #1   Title Pt will be educated on HEP to improve LUE use during daily tasks.    Time 6   Period Weeks   Status On-going     OT SHORT  TERM GOAL #2   Title Pt will decrease LUE pain to 5/10 to improve ability to assist with daily task completion.    Time 6   Period Weeks   Status Achieved     OT SHORT TERM GOAL #3   Title Pt will decrease LUE fascial restrictions from max to moderate amounts to improve mobility required for functional reaching tasks.    Time 6   Period Weeks   Status Achieved     OT SHORT TERM GOAL #4   Title Pt will improve left shoulder P/ROM to [facility] to improve ability to assist with donning shirts.    Time 6   Period Weeks   Status Achieved     OT SHORT TERM GOAL #5   Title Pt will improve left elbow A/ROM to WNL to improve ability to use LUE as assist during meals.    Time 6   Period Weeks   Status On-going     OT SHORT TERM GOAL #6   Title Pt will improve left wrist P/ROM to [facility] to improve ability to assist with grooming tasks.    Time 6   Period Weeks   Status Achieved     OT SHORT TERM GOAL #7   Title Pt will improve LUE strength to 3-/5 to improve ability to use LUE as assist during bathing tasks.    Time 6   Period Weeks   Status Achieved     OT SHORT TERM GOAL #8   Title Pt will increase LUE strength by 10# to improve ability to grasp a cup with LUE.    Time 6   Period Weeks   Status Achieved     OT SHORT TERM GOAL  #9   TITLE Pt will improve left pinch strength by 2# to improve ability to grasp utensils/tools with LUE.    Time 6   Period Weeks    Status Achieved           OT Long Term Goals - 07/05/16 1640      OT LONG TERM GOAL #1   Title Pt will return to prior level of functioning and independence in ADL and leisure tasks use LUE as assist/non-dominant arm.    Time 12   Period Weeks   Status On-going     OT LONG TERM GOAL #2   Title Pt will decrease LUE pain to 3/10 or less to improve ability to use LUE for functional task completion.    Time 12   Period Weeks   Status On-going     OT LONG TERM GOAL #3   Title Pt will decrease LUE fascial restrictions from mod to min amounts or less to improve mobility required for overhead reaching.    Time 12   Period Weeks   Status On-going     OT LONG TERM GOAL #4   Title Pt will improve left shoulder A/ROM to [facility] to improve ability to wash hair.    Time 12   Period Weeks   Status On-going     OT LONG TERM GOAL #5   Title Pt will improve left wrist A/ROM to [facility] to improve ability to work in flowers.    Time 12   Period Weeks   Status Achieved     OT LONG TERM GOAL #6   Title Pt will improve LUE strength to 4/5 to improve ability to perform yardwork tasks.    Time 12   Period Weeks   Status  On-going     OT LONG TERM GOAL #7   Title Pt will improve left grip strength by 15# to improve ability to grasp plates with left hand.    Time 12   Period Weeks   Status On-going     OT LONG TERM GOAL #8   Title Pt will improve left pinch strength by 5# to improve ability to grasp cooking supplies with left hand.    Time 12   Period Weeks   Status On-going               Plan - 08/01/16 1549    Clinical Impression Statement A: Continued with strengthening this session with 1# weight, increased gripper resistance for large beads to 35#. Pt able to tolerate UBE for 6' total this session. Verbal cuing for form and technique during exercises. Pt reports she is completing HEP at home.    Plan P: Reassessment, update HEP, and discharge pt.    OT Home Exercise Plan  4/25: shoulder pendulums, elbow flexion/extension, finger/hand A/ROM; 5/9: AA/ROM shoulder; 5/16: wrist A/ROM; 5/23: yellow theraputty; 5/29: shoulder stretches   Consulted and Agree with Plan of Care Patient   Family Member Consulted sister-Alma      Patient will benefit from skilled therapeutic intervention in order to improve the following deficits and impairments:  Decreased scar mobility, Decreased knowledge of precautions, Decreased activity tolerance, Decreased strength, Impaired flexibility, Decreased range of motion, Pain, Increased edema, Decreased coordination, Increased fascial restricitons, Impaired UE functional use  Visit Diagnosis: Pain in left wrist  Acute pain of left shoulder  Stiffness of left wrist, not elsewhere classified  Stiffness of left shoulder, not elsewhere classified  Other symptoms and signs involving the musculoskeletal system    Problem List Patient Active Problem List   Diagnosis Date Noted  . Wrist fracture 06/26/2016  . Humeral fracture 06/26/2016  . Encounter for screening colonoscopy 12/31/2015  . Memory difficulty 06/03/2014  . Headache disorder 06/03/2014  . Atrial flutter ([place]) 05/09/2013  . COPD (chronic obstructive pulmonary disease) ([place]) 05/09/2013  . Tobacco abuse 05/08/2013  . LBBB (left bundle branch block)- intermittent 05/08/2013  . Chronic diarrhea 05/11/2011  . Chronic nausea 05/11/2011  . Dyspepsia 02/24/2011  . IBS (irritable bowel syndrome) 02/24/2011  . Chest pain 12/05/2010  . Hypertension 12/05/2010   Guadelupe Sabin, OTR/L  [phone] 08/01/2016, 3:51 PM  [place] [address] [place], [place], [address] Phone: [phone]   Fax:  [phone]  Name: CHANITA BODEN MRN: [id number] Date of Birth: 09-10-1950

## 2016-08-02 ENCOUNTER — Encounter (HOSPITAL_COMMUNITY): Payer: BLUE CROSS/BLUE SHIELD | Admitting: Occupational Therapy

## 2016-08-03 ENCOUNTER — Ambulatory Visit (HOSPITAL_COMMUNITY)
Admission: RE | Admit: 2016-08-03 | Discharge: 2016-08-03 | Disposition: A | Discharge status: Home / Self Care | Payer: BLUE CROSS/BLUE SHIELD | Source: Ambulatory Visit | Attending: Endocrinology | Admitting: Endocrinology

## 2016-08-03 DIAGNOSIS — M859 Disorder of bone density and structure, unspecified: Secondary | ICD-10-CM | POA: Diagnosis present

## 2016-08-03 DIAGNOSIS — M8588 Other specified disorders of bone density and structure, other site: Secondary | ICD-10-CM | POA: Diagnosis not present

## 2016-08-04 ENCOUNTER — Ambulatory Visit (HOSPITAL_COMMUNITY): Payer: BLUE CROSS/BLUE SHIELD | Admitting: Occupational Therapy

## 2016-08-04 ENCOUNTER — Encounter (HOSPITAL_COMMUNITY): Payer: Self-pay | Admitting: Occupational Therapy

## 2016-08-04 DIAGNOSIS — M25612 Stiffness of left shoulder, not elsewhere classified: Secondary | ICD-10-CM

## 2016-08-04 DIAGNOSIS — R29898 Other symptoms and signs involving the musculoskeletal system: Secondary | ICD-10-CM

## 2016-08-04 DIAGNOSIS — M25532 Pain in left wrist: Secondary | ICD-10-CM | POA: Diagnosis not present

## 2016-08-04 DIAGNOSIS — M25512 Pain in left shoulder: Secondary | ICD-10-CM

## 2016-08-04 DIAGNOSIS — M25632 Stiffness of left wrist, not elsewhere classified: Secondary | ICD-10-CM

## 2016-08-04 NOTE — Patient Instructions (Signed)
Strengthening Exercises:  *Complete exercises using _2___ pound weight, __15__times each, __1__times per day*  1) WRIST EXTENSION CURLS - TABLE  Hold a small free weight, rest your forearm on a table and bend your wrist up and down with your palm face down as shown.      2) WRIST FLEXION CURLS - TABLE  Hold a small free weight, rest your forearm on a table and bend your wrist up and down with your palm face up as shown.     3) FREE WEIGHT RADIAL/ULNAR DEVIATION - TABLE  Hold a small free weight, rest your forearm on a table and bend your wrist up and down with your palm facing towards the side as shown.     4) Pronation  Forearm supported on table with wrist in neutral position. Using a weight, roll wrist so that palm faces downward. Hold for 2 seconds and return to starting position.     5) Supination  Forearm supported on table with wrist in neutral position. Using a weight, roll wrist so that palm is now facing upward. Hold for 2 seconds and return to starting position.

## 2016-08-04 NOTE — Therapy (Signed)
[place], [place], [address] Phone: [phone]   Fax:  [phone]  Occupational Therapy Reassessment, Treatment, and Discharge Summary  Patient Details  Name: Melanie Bradley MRN: [id number] Date of Birth: 06-17-1950 Referring Provider: Dr. Meredith Pel  Encounter Date: 08/04/2016      OT End of Session - 08/04/16 1538    Visit Number 20   Number of Visits 30   Date for OT Re-Evaluation 08/06/16   Authorization Type [employer]   Authorization Time Period 30 visit limit   Authorization - Visit Number 20   Authorization - Number of Visits 30   OT Start Time 8502   OT Stop Time 1536   OT Time Calculation (min) 62 min   Activity Tolerance Patient tolerated treatment well   Behavior During Therapy [facility] for tasks assessed/performed      Past Medical History:  Diagnosis Date  . Atrial fibrillation ([place])   . Bloating   . Complication of anesthesia     " hard to wake up sometimes "  . Dementia   . Diverticula of colon   . Epigastric burning sensation   . GERD (gastroesophageal reflux disease)   . Headache disorder 06/03/2014  . Headache(784.0)   . Helicobacter pylori gastritis 2004  . Hemorrhoids 06/26/03   Dr. Laural Golden tcs  . Hiatal hernia 06/26/03   Dr. Laural Golden egd  . Hypertension   . Hypothyroidism   . 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 Memory difficulty 06/03/2014  . Shoulder fracture, left   . Spastic colon   . Wrist fracture     Past Surgical History:  Procedure Laterality Date  . APPENDECTOMY    . CHOLECYSTECTOMY    . COLONOSCOPY  06/26/03   Dr. Kem Boroughs diverticula at the sigmoid colon with one of the transverse colon, normal terminal ileoscopy, small external hemorrhoids the  . COLONOSCOPY N/A 01/20/2016   Procedure: COLONOSCOPY;  Surgeon: Daneil Dolin, MD;  [facility];  Service: Endoscopy;  Laterality: N/A;  2:30 pm  . ESOPHAGOGASTRODUODENOSCOPY   03/15/2011   Dr. Trevor Iha hiatal hernia, abnormal gastric mucosa of unclear significance. Gastric biopsies negative, no H. pylori.Procedure: ESOPHAGOGASTRODUODENOSCOPY (EGD);  Surgeon: Daneil Dolin, MD;  [facility];  Service: Endoscopy;  Laterality: N/A;  7:30  . ORIF HUMERUS FRACTURE Left 05/29/2016   Procedure: LEFT OPEN REDUCTION INTERNAL FIXATION (ORIF) PROXIMAL HUMERUS FRACTURE;  Surgeon: Meredith Pel, MD;  Location: [facility];  Service: Orthopedics;  Laterality: Left;  . ORIF WRIST FRACTURE Left 05/29/2016   Procedure: OPEN REDUCTION INTERNAL FIXATION (ORIF) LEFT WRIST FRACTURE;  Surgeon: Meredith Pel, MD;  Location: [facility];  Service: Orthopedics;  Laterality: Left;  . TUBAL LIGATION    . UPPER GASTROINTESTINAL ENDOSCOPY  06/26/03   Dr. Wynelle Bourgeois sliding hiatal hernia with 8 mm tongue of gastric type mucosa at distal esophagus bile in the stomach.    There were no vitals filed for this visit.      Subjective Assessment - 08/04/16 1437    Subjective  S: I know what to do now.    Currently in Pain? No/denies            [facility] OT Assessment - 08/04/16 1436      Assessment   Diagnosis s/p ORIF of left humerus fracture, ORIF left wrist     Precautions   Precautions Shoulder   Type of Shoulder Precautions  Per MD: Begin with P/ROM and progress as tolerated.      Palpation   Palpation comment Min fascial restrictions along upper arm, trapezius, and scapularis regions     AROM   Overall AROM Comments Assessed seated, er/IR adducted   AROM Assessment Site Shoulder   Right/Left Shoulder Left   Left Shoulder Flexion 140 Degrees  105 previous   Left Shoulder ABduction 180 Degrees  112 previous   Left Shoulder Internal Rotation 90 Degrees  same as previous   Left Shoulder External Rotation 66 Degrees  46 previous   Right/Left Forearm Left   Left Forearm Pronation 90 Degrees  same as previous   Left Forearm Supination 90 Degrees  same as previous    Right/Left Wrist Left   Left Wrist Extension 50 Degrees  40 previous   Left Wrist Flexion 60 Degrees  40 previous   Left Wrist Radial Deviation 25 Degrees  20 previous   Left Wrist Ulnar Deviation 25 Degrees  20 previous     PROM   Overall PROM Comments shoulder assessed supine, er/IR adducted; wrist and elbow assessed seated.    PROM Assessment Site Shoulder   Right/Left Shoulder Left   Left Shoulder Flexion 152 Degrees  136 previous   Left Shoulder ABduction 180 Degrees  150 previous   Left Shoulder Internal Rotation 90 Degrees  same as previous   Left Shoulder External Rotation 90 Degrees  58 previous   Left Wrist Extension 60 Degrees  55 previous   Left Wrist Flexion 70 Degrees  60 previous   Left Wrist Radial Deviation 25 Degrees  20 previous   Left Wrist Ulnar Deviation 25 Degrees  20 previous     Strength   Overall Strength Comments Assessed seated, er/IR adducted   Strength Assessment Site Shoulder   Right/Left Shoulder Left   Left Shoulder Flexion 4/5  3/5 previous   Left Shoulder ABduction 4-/5  3/5 previous   Left Shoulder Internal Rotation 5/5  3/5 previous   Left Shoulder External Rotation 4/5  3/5 previous   Right/Left Elbow Left   Left Elbow Flexion 4+/5  4-/5 previous   Left Elbow Extension 4/5  3+/5 previous   Right/Left Forearm Left   Left Forearm Pronation 4+/5  3/5 previous   Left Forearm Supination 4/5  3/5 previous   Right/Left Wrist Left   Left Wrist Flexion 5/5  4/5 previous   Left Wrist Extension 5/5  4/5 previous   Left Wrist Radial Deviation 4+/5  4/5 previous   Left Wrist Ulnar Deviation 4+/5  4/5 previous   Left Hand Gross Grasp Functional   Left Hand Grip (lbs) 32  20 previous   Left Hand Lateral Pinch 7 lbs  same as previous   Left Hand 3 Point Pinch 6 lbs  same as previous                  OT Treatments/Exercises (OP) - 08/04/16 1437      Exercises   Exercises Shoulder;Elbow;Wrist;Hand      Shoulder Exercises: Supine   Protraction PROM;5 reps   Horizontal ABduction PROM;5 reps   External Rotation PROM;5 reps   Internal Rotation PROM;5 reps   Flexion PROM;5 reps   ABduction PROM;5 reps     Shoulder Exercises: Seated   Protraction Strengthening;15 reps   Protraction Weight (lbs) 1   Horizontal ABduction Strengthening;15 reps   Horizontal ABduction Weight (lbs) 1   External Rotation Strengthening;15 reps   External Rotation Weight (lbs)  1   Internal Rotation Strengthening;15 reps   Internal Rotation Weight (lbs) 1   Flexion Strengthening;15 reps   Flexion Weight (lbs) 1   Abduction Strengthening;15 reps   ABduction Weight (lbs) 1     Shoulder Exercises: ROM/Strengthening   UBE (Upper Arm Bike) Level 1 3' forward 3' reverse  cuing for speed and direction   X to V Arms 15X, 1# weight     Elbow Exercises   Forearm Supination PROM;5 reps;Strengthening;15 reps  2#   Forearm Pronation PROM;5 reps;Strengthening;15 reps  2#   Wrist Flexion PROM;5 reps;Strengthening;15 reps   Bar Weights/Barbell (Wrist Flexion) 2 lbs   Wrist Extension PROM;5 reps;Strengthening;15 reps   Bar Weights/Barbell (Wrist Extension) 2 lbs     Additional Wrist Exercises   Sponges Pt used green clothespin with 3 point pinch to grasp 20 high resistance sponges and place in bucket. Mod difficulty, fatigue at end of task. 3 rest breaks     Manual Therapy   Manual Therapy Myofascial release;Muscle Energy Technique   Manual therapy comments Completed separately from therapeutic exercise   Myofascial Release Myofascial release to left wrist, volar and dorsal forearm, elbow, upper arm, trapezius, and scapularis regions to decrease pain and fascial restrictions and increase joint range of motion. Scar massage to incisions to improve scar mobility and reduce fascial restrictions.    Muscle Energy Technique Muscle energy technqiue performed to decrease muscle spasm and increase range of motion                 OT Education - 08/04/16 1520    Education provided Yes   Education Details wrist strengthening exercises   Person(s) Educated Patient   Methods Explanation;Demonstration;Handout   Comprehension Verbalized understanding;Returned demonstration          OT Short Term Goals - 08/04/16 1522      OT SHORT TERM GOAL #1   Title Pt will be educated on HEP to improve LUE use during daily tasks.    Time 6   Period Weeks   Status Achieved     OT SHORT TERM GOAL #2   Title Pt will decrease LUE pain to 5/10 to improve ability to assist with daily task completion.    Time 6   Period Weeks   Status Achieved     OT SHORT TERM GOAL #3   Title Pt will decrease LUE fascial restrictions from max to moderate amounts to improve mobility required for functional reaching tasks.    Time 6   Period Weeks   Status Achieved     OT SHORT TERM GOAL #4   Title Pt will improve left shoulder P/ROM to [facility] to improve ability to assist with donning shirts.    Time 6   Period Weeks   Status Achieved     OT SHORT TERM GOAL #5   Title Pt will improve left elbow A/ROM to W[facility] to improve ability to use LUE as assist during meals.    Time 6   Period Weeks   Status Deferred     OT SHORT TERM GOAL #6   Title Pt will improve left wrist P/ROM to [facility] to improve ability to assist with grooming tasks.    Time 6   Period Weeks   Status Achieved     OT SHORT TERM GOAL #7   Title Pt will improve LUE strength to 3-/5 to improve ability to use LUE as assist during bathing tasks.    Time 6  Period Weeks   Status Achieved     OT SHORT TERM GOAL #8   Title Pt will increase LUE strength by 10# to improve ability to grasp a cup with LUE.    Time 6   Period Weeks   Status Achieved     OT SHORT TERM GOAL  #9   TITLE Pt will improve left pinch strength by 2# to improve ability to grasp utensils/tools with LUE.    Time 6   Period Weeks   Status Achieved           OT Long Term Goals -  08/04/16 1522      OT LONG TERM GOAL #1   Title Pt will return to prior level of functioning and independence in ADL and leisure tasks use LUE as assist/non-dominant arm.    Time 12   Period Weeks   Status Achieved     OT LONG TERM GOAL #2   Title Pt will decrease LUE pain to 3/10 or less to improve ability to use LUE for functional task completion.    Time 12   Period Weeks   Status Achieved     OT LONG TERM GOAL #3   Title Pt will decrease LUE fascial restrictions from mod to min amounts or less to improve mobility required for overhead reaching.    Time 12   Period Weeks   Status Achieved     OT LONG TERM GOAL #4   Title Pt will improve left shoulder A/ROM to [facility] to improve ability to wash hair.    Time 12   Period Weeks   Status Achieved     OT LONG TERM GOAL #5   Title Pt will improve left wrist A/ROM to [facility] to improve ability to work in flowers.    Time 12   Period Weeks   Status Achieved     OT LONG TERM GOAL #6   Title Pt will improve LUE strength to 4/5 to improve ability to perform yardwork tasks.    Time 12   Period Weeks   Status Achieved     OT LONG TERM GOAL #7   Title Pt will improve left grip strength by 15# to improve ability to grasp plates with left hand.    Time 12   Period Weeks   Status Achieved     OT LONG TERM GOAL #8   Title Pt will improve left pinch strength by 5# to improve ability to grasp cooking supplies with left hand.    Time 12   Period Weeks   Status Not Met               Plan - 08/04/16 1538    Clinical Impression Statement A: Reassessment completed this session, pt has met all STGs and 7/8 LTGs, making improvements in pain, ROM, strength, grip and pinch strength, and functional use of LUE. Pt reports she is cooking, mowing, and using LUE as assist during all daily tasks. Reviewed HEP and added wrist strengthening to home exercises this session. Pt agreeable to discharge.    Plan P: Discharge pt   OT Home Exercise Plan  4/25: shoulder pendulums, elbow flexion/extension, finger/hand A/ROM; 5/9: AA/ROM shoulder; 5/16: wrist A/ROM; 5/23: yellow theraputty; 5/29: shoulder stretches; 6/22: shoulder stretches   Consulted and Agree with Plan of Care Patient;Family member/caregiver   Family Member Consulted sister-Alma      Patient will benefit from skilled therapeutic intervention in order to improve the following deficits  and impairments:  Decreased scar mobility, Decreased knowledge of precautions, Decreased activity tolerance, Decreased strength, Impaired flexibility, Decreased range of motion, Pain, Increased edema, Decreased coordination, Increased fascial restricitons, Impaired UE functional use  Visit Diagnosis: Pain in left wrist  Acute pain of left shoulder  Stiffness of left wrist, not elsewhere classified  Stiffness of left shoulder, not elsewhere classified  Other symptoms and signs involving the musculoskeletal system    Problem List Patient Active Problem List   Diagnosis Date Noted  . Wrist fracture 06/26/2016  . Humeral fracture 06/26/2016  . Encounter for screening colonoscopy 12/31/2015  . Memory difficulty 06/03/2014  . Headache disorder 06/03/2014  . Atrial flutter ([place]) 05/09/2013  . COPD (chronic obstructive pulmonary disease) ([place]) 05/09/2013  . Tobacco abuse 05/08/2013  . LBBB (left bundle branch block)- intermittent 05/08/2013  . Chronic diarrhea 05/11/2011  . Chronic nausea 05/11/2011  . Dyspepsia 02/24/2011  . IBS (irritable bowel syndrome) 02/24/2011  . Chest pain 12/05/2010  . Hypertension 12/05/2010   Guadelupe Sabin, OTR/L  [phone] 08/04/2016, 3:41 PM  [place] [address] [place], [place], [address] Phone: [phone]   Fax:  [phone]  Name: Melanie Bradley MRN: [id number]    OCCUPATIONAL THERAPY DISCHARGE SUMMARY  Visits from Start of Care: 20  Current functional level related to goals /  functional outcomes: See above. Pt is using LUE for all daily task completion, mowing, gardening, and cooking. ROM and strength have improved to [facility], pt reports no pain.    Remaining deficits: Pt continues to have decreased pinch strength in left hand.    Education / Equipment: HEP reviewed (including shoulder strengthening and stretches, grip and pinch strengthening), wrist strengthening added to HEP Plan: Patient agrees to discharge.  Patient goals were met. Patient is being discharged due to meeting the stated rehab goals.  ?????      Date of Birth: Oct 24, 1950

## 2016-08-08 ENCOUNTER — Encounter (HOSPITAL_COMMUNITY): Payer: BLUE CROSS/BLUE SHIELD | Admitting: Occupational Therapy

## 2016-08-09 ENCOUNTER — Encounter (HOSPITAL_COMMUNITY): Payer: BLUE CROSS/BLUE SHIELD | Admitting: Occupational Therapy

## 2016-08-11 ENCOUNTER — Encounter (HOSPITAL_COMMUNITY): Payer: BLUE CROSS/BLUE SHIELD | Admitting: Occupational Therapy

## 2016-09-16 ENCOUNTER — Encounter (HOSPITAL_COMMUNITY): Payer: Self-pay | Admitting: *Deleted

## 2016-09-16 ENCOUNTER — Emergency Department (HOSPITAL_COMMUNITY)
Admission: EM | Admit: 2016-09-16 | Discharge: 2016-09-16 | Disposition: A | Discharge status: Home / Self Care | Payer: Commercial Managed Care - PPO | Attending: Emergency Medicine | Admitting: Emergency Medicine

## 2016-09-16 DIAGNOSIS — E039 Hypothyroidism, unspecified: Secondary | ICD-10-CM | POA: Insufficient documentation

## 2016-09-16 DIAGNOSIS — S59912A Unspecified injury of left forearm, initial encounter: Secondary | ICD-10-CM | POA: Diagnosis present

## 2016-09-16 DIAGNOSIS — J449 Chronic obstructive pulmonary disease, unspecified: Secondary | ICD-10-CM | POA: Diagnosis not present

## 2016-09-16 DIAGNOSIS — S50812A Abrasion of left forearm, initial encounter: Secondary | ICD-10-CM

## 2016-09-16 DIAGNOSIS — F17210 Nicotine dependence, cigarettes, uncomplicated: Secondary | ICD-10-CM | POA: Diagnosis not present

## 2016-09-16 DIAGNOSIS — Z23 Encounter for immunization: Secondary | ICD-10-CM | POA: Insufficient documentation

## 2016-09-16 DIAGNOSIS — W5503XA Scratched by cat, initial encounter: Secondary | ICD-10-CM | POA: Insufficient documentation

## 2016-09-16 DIAGNOSIS — I10 Essential (primary) hypertension: Secondary | ICD-10-CM | POA: Insufficient documentation

## 2016-09-16 DIAGNOSIS — Y929 Unspecified place or not applicable: Secondary | ICD-10-CM | POA: Diagnosis not present

## 2016-09-16 DIAGNOSIS — Y939 Activity, unspecified: Secondary | ICD-10-CM | POA: Diagnosis not present

## 2016-09-16 DIAGNOSIS — Y999 Unspecified external cause status: Secondary | ICD-10-CM | POA: Insufficient documentation

## 2016-09-16 DIAGNOSIS — Z7982 Long term (current) use of aspirin: Secondary | ICD-10-CM | POA: Diagnosis not present

## 2016-09-16 DIAGNOSIS — Z79899 Other long term (current) drug therapy: Secondary | ICD-10-CM | POA: Insufficient documentation

## 2016-09-16 MED ORDER — AZITHROMYCIN 250 MG PO TABS: ORAL_TABLET | ORAL | 0 refills | Status: DC

## 2016-09-16 MED ORDER — TETANUS-DIPHTH-ACELL PERTUSSIS 5-2.5-18.5 LF-MCG/0.5 IM SUSP
0.5000 mL | Freq: Once | INTRAMUSCULAR | Status: AC
  Administered 2016-09-16: 0.5 mL via INTRAMUSCULAR
  Filled 2016-09-16: qty 0.5

## 2016-09-16 MED ORDER — AZITHROMYCIN 250 MG PO TABS
500.0000 mg | ORAL_TABLET | Freq: Once | ORAL | Status: AC
  Administered 2016-09-16: 500 mg via ORAL
  Filled 2016-09-16: qty 2

## 2016-09-16 NOTE — ED Notes (Signed)
Advised to have her bp rechecked by PCP

## 2016-09-16 NOTE — ED Provider Notes (Signed)
Medical screening examination/treatment/procedure(s) were conducted as a shared visit with non-physician practitioner(s) and myself.  I personally evaluated the patient during the encounter.   EKG Interpretation None       Results for orders placed or performed during the hospital encounter of 05/29/16  Type and screen [place]  Result Value Ref Range   ABO/RH(D) A POS    Antibody Screen NEG    Sample Expiration 06/01/2016   ABO/Rh  Result Value Ref Range   ABO/RH(D) A POS    No results found.  Patient seen by me along with the physician assistant. I was brought into the picture because of the elevated blood pressure.  Getting of blood pressure 156/113. Patient has a history of hypertension does have a primary care doctor to follow-up with. In addition she receives some cat scratch to her left forearm anterior surface. Not bites. Some evidence of some mild inflammatory changes no significant cellulitis. Patient based on the recommendations from up-to-date will be treated with Zithromax for the next cat scratch. She is given precautions and close follow-up. Her cats are healthy in their immunizations are up-to-date and once again the cat did not bite her.  Patient will do a daily blood pressure log and then she'll contact her primary care doctor for follow-up of the hypertension.  Patient is alert, does have some history of dementia but is functional here tonight and will follow commands. Nontoxic no acute distress. Heart regular lungs without any respiratory distress.   Fredia Sorrow, MD 09/16/16 2223

## 2016-09-16 NOTE — ED Triage Notes (Signed)
Pt c/o cat scratch to left wrist that happened today, pt reports that cat is up to date on immunizations,

## 2016-09-16 NOTE — Discharge Instructions (Signed)
Clean with mild soap and water and keep it bandaged.  Return here in 24-48 hours for recheck if worsening or recheck with your primary provider in 2 days.

## 2016-09-16 NOTE — ED Provider Notes (Signed)
[facility] DEPT Provider Note   CSN: [id number] Arrival date & time: 09/16/16  2035     History   Chief Complaint Chief Complaint  Patient presents with  . Animal Bite    HPI Melanie Bradley is a 66 y.o. female.  HPI  Melanie Bradley is a 66 y.o. female who presents to the Emergency Department complaining of cat scratches to the left forearm that occurred few hours prior to arrival.  She states that the cat scratch was accidental and cat is up to date on vaccines.  Cat is indoor only animal.  She cleaned the wounds with alcohol and noticed redness and swelling around the wounds.  She denies pain or numbness.  Last Td is unknown.  Denies cat bites.   Past Medical History:  Diagnosis Date  . Atrial fibrillation ([place])   . Bloating   . Complication of anesthesia     " hard to wake up sometimes "  . Dementia   . Diverticula of colon   . Epigastric burning sensation   . GERD (gastroesophageal reflux disease)   . Headache disorder 06/03/2014  . Headache(784.0)   . Helicobacter pylori gastritis 2004  . Hemorrhoids 06/26/03   Dr. Laural Golden tcs  . Hiatal hernia 06/26/03   Dr. Laural Golden egd  . Hypertension   . Hypothyroidism   . 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 Memory difficulty 06/03/2014  . Shoulder fracture, left   . Spastic colon   . Wrist fracture     Patient Active Problem List   Diagnosis Date Noted  . Wrist fracture 06/26/2016  . Humeral fracture 06/26/2016  . Encounter for screening colonoscopy 12/31/2015  . Memory difficulty 06/03/2014  . Headache disorder 06/03/2014  . Atrial flutter ([place]) 05/09/2013  . COPD (chronic obstructive pulmonary disease) ([place]) 05/09/2013  . Tobacco abuse 05/08/2013  . LBBB (left bundle branch block)- intermittent 05/08/2013  . Chronic diarrhea 05/11/2011  . Chronic nausea 05/11/2011  . Dyspepsia 02/24/2011  . IBS (irritable bowel syndrome) 02/24/2011  . Chest pain 12/05/2010  . Hypertension 12/05/2010      Past Surgical History:  Procedure Laterality Date  . APPENDECTOMY    . CHOLECYSTECTOMY    . COLONOSCOPY  06/26/03   Dr. Kem Boroughs diverticula at the sigmoid colon with one of the transverse colon, normal terminal ileoscopy, small external hemorrhoids the  . COLONOSCOPY N/A 01/20/2016   Procedure: COLONOSCOPY;  Surgeon: Daneil Dolin, MD;  Location: [facility];  Service: Endoscopy;  Laterality: N/A;  2:30 pm  . ESOPHAGOGASTRODUODENOSCOPY  03/15/2011   Dr. Trevor Iha hiatal hernia, abnormal gastric mucosa of unclear significance. Gastric biopsies negative, no H. pylori.Procedure: ESOPHAGOGASTRODUODENOSCOPY (EGD);  Surgeon: Daneil Dolin, MD;  Location: [facility];  Service: Endoscopy;  Laterality: N/A;  7:30  . ORIF HUMERUS FRACTURE Left 05/29/2016   Procedure: LEFT OPEN REDUCTION INTERNAL FIXATION (ORIF) PROXIMAL HUMERUS FRACTURE;  Surgeon: Meredith Pel, MD;  Location: [facility];  Service: Orthopedics;  Laterality: Left;  . ORIF WRIST FRACTURE Left 05/29/2016   Procedure: OPEN REDUCTION INTERNAL FIXATION (ORIF) LEFT WRIST FRACTURE;  Surgeon: Meredith Pel, MD;  Location: [facility];  Service: Orthopedics;  Laterality: Left;  . TUBAL LIGATION    . UPPER GASTROINTESTINAL ENDOSCOPY  06/26/03   Dr. Wynelle Bourgeois sliding hiatal hernia with 8 mm tongue of gastric type mucosa at distal esophagus bile in the stomach.    OB History    No data available  Home Medications    Prior to Admission medications   Medication Sig Start Date End Date Taking? Authorizing Provider  alendronate (FOSAMAX) 70 MG tablet Take 70 mg by mouth once a week. Take with a full glass of water on an empty stomach.   Yes [provider]  aspirin 81 MG tablet Take 81 mg by mouth daily.   Yes [provider]  calcium carbonate (CALTRATE 600) 1500 (600 Ca) MG TABS tablet Take 600 mg of elemental calcium by mouth 2 (two) times daily with a meal.   Yes [provider]   Cyanocobalamin (B-12 PO) Take 6,000 mcg by mouth daily.   Yes [provider]  donepezil (ARICEPT) 10 MG tablet Take 1 tablet (10 mg total) by mouth at bedtime. 07/28/16  Yes Kathrynn Ducking, MD  escitalopram (LEXAPRO) 10 MG tablet Take 10 mg by mouth every evening.  10/16/14  Yes [provider]  levothyroxine (SYNTHROID, LEVOTHROID) 100 MCG tablet Take 100 mcg by mouth daily. **BRAND NAME ONLY** DOES NOT TAKE ON FRIDAY   Yes [provider]  losartan (COZAAR) 100 MG tablet Take 100 mg by mouth daily.   Yes [provider]  memantine (NAMENDA) 10 MG tablet Take 1 tablet (10 mg total) by mouth 2 (two) times daily. 07/28/16  Yes Kathrynn Ducking, MD  OVER THE COUNTER MEDICATION Take 1 tablet by mouth daily. cognisense otc supplement   Yes [provider]  potassium chloride SA (K-DUR,KLOR-CON) 20 MEQ tablet Take 1 tablet (20 mEq total) by mouth 2 (two) times daily. Patient not taking: Reported on 09/16/2016 05/25/16   Shary Decamp, PA-C    Family History Family History  Problem Relation Age of Onset  . Hypertension Mother   . Angina Mother   . COPD Father   . Heart failure Father   . Cancer Sister   . Dementia Sister   . Cardiomyopathy Brother   . Pneumonia Other   . Migraines Sister   . Colon cancer Neg Hx     Social History Social History  Substance Use Topics  . Smoking status: Current Every Day Smoker    Packs/day: 1.50    Years: 45.00    Types: Cigarettes    Start date: 09/10/1966  . Smokeless tobacco: Never Used  . Alcohol use No     Allergies   Penicillins; Benadryl [diphenhydramine hcl]; Msm [methylsulfonylmethane]; Nubain [nalbuphine hcl]; Sulfa antibiotics; and Codeine   Review of Systems Review of Systems  Constitutional: Negative for chills and fever.  Cardiovascular: Negative for chest pain.  Musculoskeletal: Negative for arthralgias, back pain, joint swelling and neck pain.  Skin: Positive for wound.       Cat  scratches to left forearm  Neurological: Negative for dizziness, weakness and numbness.  Hematological: Negative for adenopathy. Does not bruise/bleed easily.  All other systems reviewed and are negative.    Physical Exam Updated Vital Signs BP (!) 156/113 (BP Location: Right Arm)   Pulse 94   Temp 98.1 F (36.7 C) (Oral)   Resp 17   Ht 5\' 2"  (1.575 m)   Wt 50.8 kg (112 lb)   SpO2 100%   BMI 20.49 kg/m   Physical Exam  Constitutional: She is oriented to person, place, and time. She appears well-developed and well-nourished. No distress.  HENT:  Head: Atraumatic.  Neck: Normal range of motion.  Cardiovascular: Normal rate, regular rhythm, normal heart sounds and intact distal pulses.   Pulmonary/Chest: Effort normal. No respiratory  distress.  Musculoskeletal: She exhibits no edema or tenderness.       Left hand: She exhibits normal range of motion and no tenderness. Normal sensation noted. Normal strength noted. She exhibits no finger abduction.  Lymphadenopathy:    She has no cervical adenopathy.  Neurological: She is alert and oriented to person, place, and time. No sensory deficit.  Skin: Skin is warm. Capillary refill takes less than 2 seconds. There is erythema.  Two small scratches/punctures to the left forearm.  No lymphangitis.  Mild surrounding erythema  Psychiatric: She has a normal mood and affect.  Nursing note and vitals reviewed.    ED Treatments / Results  Labs (all labs ordered are listed, but only abnormal results are displayed) Labs Reviewed - No data to display  EKG  EKG Interpretation None       Radiology No results found.  Procedures Procedures (including critical care time)  Medications Ordered in ED Medications  Tdap (BOOSTRIX) injection 0.5 mL (0.5 mLs Intramuscular Given 09/16/16 2125)  azithromycin (ZITHROMAX) tablet 500 mg (500 mg Oral Given 09/16/16 2125)     Initial Impression / Assessment and Plan / ED Course  I have reviewed  the triage vital signs and the nursing notes.  Pertinent labs & imaging results that were available during my care of the patient were reviewed by me and considered in my medical decision making (see chart for details).    Wounds cleaned and bandaged.    Leading edge of erythema marked by me.  2.5 cm area of surrounding erythema.  NV intact, motor intact.  No lymphangitis. Td updated.  Pt agrees to recheck in 1-2 days  Pt is hypertensive.  Known hx.  Currently taking anti-hypertensive medication. She denies sx's other than cat scratch.    Pt also seen by Dr Rogene Houston and care plan discussed.  Return precautions discussed. Pt agrees to f/u with PCP in 2-3 days  Final Clinical Impressions(s) / ED Diagnoses   Final diagnoses:  Cat scratch of left forearm, initial encounter    New Prescriptions New Prescriptions   No medications on file     Kem Parkinson, PA-C 09/17/16 2225    Fredia Sorrow, MD 09/18/16 1626

## 2016-12-21 ENCOUNTER — Ambulatory Visit (INDEPENDENT_AMBULATORY_CARE_PROVIDER_SITE_OTHER): Payer: Commercial Managed Care - PPO | Admitting: Adult Health

## 2016-12-21 ENCOUNTER — Encounter: Payer: Self-pay | Admitting: Adult Health

## 2016-12-21 VITALS — BP 120/88 | HR 56 | Wt 114.8 lb

## 2016-12-21 DIAGNOSIS — R413 Other amnesia: Secondary | ICD-10-CM

## 2016-12-21 NOTE — Progress Notes (Signed)
PATIENT: Melanie Bradley DOB: 09/24/1950  REASON FOR VISIT: follow up-memory HISTORY FROM: patient  HISTORY OF PRESENT ILLNESS: Today 12/21/16 Melanie Bradley is a 66 year old female with a history of progressive memory disturbance.  She returns today for an evaluation.  Overall she feels that her memory has remained stable.  She continues on Aricept and Namenda.  She lives at home with her husband.  She is able to complete all ADLs independently.  She continues to help him mow the lawn.  She prepares meals without difficulty.  She does assist her husband with the finances.  She denies any trouble sleeping.  Denies any change in appetite.  No change in mood or behavior.  She states that she does walk about 2 miles a day.  She returns today for an evaluation.  HISTORY 06/20/16:Melanie Bradley is a 66 year old right-handed white female with a history of a progressive memory disturbance. The patient recently had a fall on 05/22/2016 when she tried to get around to a lawnmower and fell backwards and fractured her left humerus and left wrist. The patient required surgery. The patient has done fairly well from this. She did require some pain medications, but she only requires an occasional hydrocodone at this point. The husband does not believe that there has been a significant alteration in her cognitive status since last seen in November 2017. The patient had been operating a car on occasion without difficulty. The patient does cook. She does not manage her own medications or appointments, she does not do the finances. She is sleeping fairly well at night. She returns to this office for an evaluation   REVIEW OF SYSTEMS: Out of a complete 14 system review of symptoms, the patient complains only of the following symptoms, and all other reviewed systems are negative.  See HPI  ALLERGIES: Allergies  Allergen Reactions  . Penicillins Hives and Swelling    Has patient had a PCN reaction causing IMMEDIATE  RASH, FACIAL/TONGUE/THROAT SWELLING, SOB, OR LIGHTHEADEDNESS WITH HYPOTENSION:  #  #  #  YES  #  #  #  Has patient had a PCN reaction causing severe rash involving mucus membranes or skin necrosis: No Has patient had a PCN reaction that required hospitalization No Has patient had a PCN reaction occurring within the last 10 years: No If all of the above answers are "NO", then may proceed with Cephalosporin use.   . Benadryl [Diphenhydramine Hcl] Hives  . Msm [Methylsulfonylmethane] Hives  . Nubain [Nalbuphine Hcl] Hives  . Sulfa Antibiotics Hives  . Codeine     UNSPECIFIED REACTION [IF AT ALL] REFUSES TO TAKE    HOME MEDICATIONS: Outpatient Medications Prior to Visit  Medication Sig Dispense Refill  . alendronate (FOSAMAX) 70 MG tablet Take 70 mg by mouth once a week. Take with a full glass of water on an empty stomach.    Marland Kitchen amLODipine (NORVASC) 10 MG tablet Take 10 mg daily by mouth.    Marland Kitchen aspirin 81 MG tablet Take 81 mg by mouth daily.    . calcium carbonate (CALTRATE 600) 1500 (600 Ca) MG TABS tablet Take 600 mg of elemental calcium by mouth 2 (two) times daily with a meal.    . Cyanocobalamin (B-12 PO) Take 6,000 mcg by mouth daily.    Marland Kitchen donepezil (ARICEPT) 10 MG tablet Take 1 tablet (10 mg total) by mouth at bedtime. 90 tablet 3  . escitalopram (LEXAPRO) 10 MG tablet Take 10 mg by mouth every evening.  0  . levothyroxine (SYNTHROID, LEVOTHROID) 100 MCG tablet Take 100 mcg by mouth daily. **BRAND NAME ONLY** DOES NOT TAKE ON FRIDAY    . losartan (COZAAR) 100 MG tablet Take 100 mg by mouth daily.    . memantine (NAMENDA) 10 MG tablet Take 1 tablet (10 mg total) by mouth 2 (two) times daily. 180 tablet 3  . OVER THE COUNTER MEDICATION Take 1 tablet by mouth daily. cognisense otc supplement    . potassium chloride SA (K-DUR,KLOR-CON) 20 MEQ tablet Take 1 tablet (20 mEq total) by mouth 2 (two) times daily. 30 tablet 0  . azithromycin (ZITHROMAX) 250 MG tablet Take first 2 tablets  together, then 1 every day until finished. 6 tablet 0   No facility-administered medications prior to visit.     PAST MEDICAL HISTORY: Past Medical History:  Diagnosis Date  . Atrial fibrillation ([place])   . Bloating   . Complication of anesthesia     " hard to wake up sometimes "  . Dementia   . Diverticula of colon   . Epigastric burning sensation   . GERD (gastroesophageal reflux disease)   . Headache disorder 06/03/2014  . Headache(784.0)   . Helicobacter pylori gastritis 2004  . Hemorrhoids 06/26/03   Dr. Laural Golden tcs  . Hiatal hernia 06/26/03   Dr. Laural Golden egd  . Hypertension   . Hypothyroidism   . 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 Memory difficulty 06/03/2014  . Shoulder fracture, left   . Spastic colon   . Wrist fracture     PAST SURGICAL HISTORY: Past Surgical History:  Procedure Laterality Date  . APPENDECTOMY    . CHOLECYSTECTOMY    . COLONOSCOPY  06/26/03   Dr. Kem Boroughs diverticula at the sigmoid colon with one of the transverse colon, normal terminal ileoscopy, small external hemorrhoids the  . TUBAL LIGATION    . UPPER GASTROINTESTINAL ENDOSCOPY  06/26/03   Dr. Wynelle Bourgeois sliding hiatal hernia with 8 mm tongue of gastric type mucosa at distal esophagus bile in the stomach.    FAMILY HISTORY: Family History  Problem Relation Age of Onset  . Hypertension Mother   . Angina Mother   . COPD Father   . Heart failure Father   . Cancer Sister   . Dementia Sister   . Cardiomyopathy Brother   . Pneumonia Other   . Migraines Sister   . Colon cancer Neg Hx     SOCIAL HISTORY: Social History   Socioeconomic History  . Marital status: Married    Spouse name: Not on file  . Number of children: Not on file  . Years of education: 69  . Highest education level: Not on file  Social Needs  . Financial resource strain: Not on file  . Food insecurity - worry: Not on file  . Food insecurity - inability: Not on file  .  Transportation needs - medical: Not on file  . Transportation needs - non-medical: Not on file  Occupational History  . Not on file  Tobacco Use  . Smoking status: Current Every Day Smoker    Packs/day: 1.50    Years: 45.00    Pack years: 67.50    Types: Cigarettes    Start date: 09/10/1966  . Smokeless tobacco: Never Used  . Tobacco comment: 12/21/16 1 PPD  Substance and Sexual Activity  . Alcohol use: No    Alcohol/week: 0.0 oz  . Drug use: No  . Sexual activity:  Yes  Other Topics Concern  . Not on file  Social History Narrative   Patient is right handed.   Patient drinks 2 cups of caffeine daily.   Education [employer]      PHYSICAL EXAM  Vitals:   12/21/16 1402  BP: 120/88  Pulse: (!) 56  Weight: 114 lb 12.8 oz (52.1 kg)   Body mass index is 21 kg/m.   MMSE - Mini Mental State Exam 12/21/2016 06/20/2016 12/22/2015  Orientation to time 3 1 4   Orientation to Place 3 1 3   Registration 3 3 3   Attention/ Calculation 0 1 4  Recall 0 0 1  Language- name 2 objects 2 1 2   Language- repeat 1 1 1   Language- follow 3 step command 2 3 3   Language- follow 3 step command-comments didn't put on floor - -  Language- read & follow direction 1 1 1   Write a sentence 1 1 1   Copy design 0 0 1  Total score 16 13 24      Generalized: Well developed, in no acute distress   Neurological examination  Mentation: Alert oriented to time, place, history taking. Follows all commands speech and language fluent Cranial nerve II-XII: Pupils were equal round reactive to light. Extraocular movements were full, visual field were full on confrontational test. Facial sensation and strength were normal. Uvula tongue midline. Head turning and shoulder shrug  were normal and symmetric. Motor: The motor testing reveals 5 over 5 strength of all 4 extremities. Good symmetric motor tone is noted throughout.  Sensory: Sensory testing is intact to soft touch on all 4 extremities. No evidence of extinction  is noted.  Coordination: Cerebellar testing reveals good finger-nose-finger and heel-to-shin bilaterally.  Gait and station: Gait is normal.   Reflexes: Deep tendon reflexes are symmetric and normal bilaterally.   DIAGNOSTIC DATA (LABS, IMAGING, TESTING) - I reviewed patient records, labs, notes, testing and imaging myself where available.  Lab Results  Component Value Date   WBC 9.4 05/25/2016   HGB 10.7 (L) 05/25/2016   HCT 30.7 (L) 05/25/2016   MCV 91.4 05/25/2016   PLT 464 (H) 05/25/2016      Component Value Date/Time   NA 130 (L) 05/25/2016 1227   NA 136 (A) 09/11/2006   K 3.2 (L) 05/25/2016 1227   CL 87 (L) 05/25/2016 1227   CO2 33 (H) 05/25/2016 1227   GLUCOSE 103 (H) 05/25/2016 1227   BUN 14 05/25/2016 1227   CREATININE 1.32 (H) 05/25/2016 1227   CREATININE 0.88 05/26/2011 0755   CALCIUM 9.2 05/25/2016 1227   PROT 7.0 05/25/2016 1227   ALBUMIN 3.4 (L) 05/25/2016 1227   AST 23 05/25/2016 1227   ALT 20 05/25/2016 1227   ALKPHOS 92 05/25/2016 1227   BILITOT 0.2 (L) 05/25/2016 1227   GFRNONAA 42 (L) 05/25/2016 1227   GFRAA 49 (L) 05/25/2016 1227   Lab Results  Component Value Date   CHOL 173 05/09/2013   HDL 55 05/09/2013   LDLCALC 101 (H) 05/09/2013   TRIG 83 05/09/2013   CHOLHDL 3.1 05/09/2013   No results found for: HGBA1C Lab Results  Component Value Date   VITAMINB12 239 06/03/2014   Lab Results  Component Value Date   TSH 1.514 05/08/2013      ASSESSMENT AND PLAN 66 y.o. year old female  has a past medical history of Atrial fibrillation ([place]), Bloating, Complication of anesthesia, Dementia, Diverticula of colon, Epigastric burning sensation, GERD (gastroesophageal reflux disease), Headache disorder (06/03/2014),  DXAJOINO(676.7), Helicobacter pylori gastritis (2004), Hemorrhoids (06/26/03), Hiatal hernia (06/26/03), Hypertension, Hypothyroidism, Left bundle branch block, Memory difficulty (06/03/2014), Shoulder fracture, left, Spastic colon, and Wrist  fracture. here with:  1.  Memory disturbance  Overall the patient's memory score has remained stable.  She will continue on Aricept and namenda. I have advised that if her symptoms worsen or she develops new symptoms she should let us know.  She will follow-up in 6 months or sooner if needed.  I spent 15 minutes with the patient. 50% of this time was spent reviewing her memory score.      Ward Givens, MSN, NP-C 12/21/2016, 2:26 PM [facility] [address] [place], [place] [address] [phone]

## 2016-12-21 NOTE — Patient Instructions (Signed)
Your Plan:  Continue Aricept and Namenda Memory score is stable If your symptoms worsen or you develop new symptoms please let us know.    Thank you for coming to see us at [facility]. I hope we have been able to provide you high quality care today.  You may receive a patient satisfaction survey over the next few weeks. We would appreciate your feedback and comments so that we may continue to improve ourselves and the health of our patients.  

## 2016-12-21 NOTE — Progress Notes (Signed)
I have read the note, and I agree with the clinical assessment and plan.  Caston Coopersmith KEITH   

## 2017-04-18 ENCOUNTER — Other Ambulatory Visit: Payer: Self-pay | Admitting: Neurology

## 2017-06-21 ENCOUNTER — Encounter: Payer: Self-pay | Admitting: Adult Health

## 2017-06-21 ENCOUNTER — Ambulatory Visit: Payer: Medicare HMO | Admitting: Adult Health

## 2017-06-21 VITALS — BP 114/74 | HR 50 | Ht 62.0 in | Wt 112.0 lb

## 2017-06-21 DIAGNOSIS — R413 Other amnesia: Secondary | ICD-10-CM

## 2017-06-21 NOTE — Progress Notes (Signed)
I have read the note, and I agree with the clinical assessment and plan.  Charles K Willis   

## 2017-06-21 NOTE — Patient Instructions (Signed)
Your Plan: ° °Continue Aricept and namenda °Memory score is stable °If your symptoms worsen or you develop new symptoms please let us know.  ° ° °Thank you for coming to see us at [facility]. I hope we have been able to provide you high quality care today. ° °You may receive a patient satisfaction survey over the next few weeks. We would appreciate your feedback and comments so that we may continue to improve ourselves and the health of our patients. ° °

## 2017-06-21 NOTE — Progress Notes (Signed)
PATIENT: Melanie Bradley DOB: 01/19/1951  REASON FOR VISIT: follow up HISTORY FROM: patient  HISTORY OF PRESENT ILLNESS: Today 06/21/17 Melanie Bradley is a 67 year old female with a history of memory disturbance.  She returns today for follow-up.  She reports that her memory has remained stable.  She continues on Aricept and Namenda.  She is able to complete all ADLs independently.  She lives at home with her husband.  He does manage most of the finances.  She reports that she continues to do all the cooking.  She also helps with yard work.  Her husband denies any significant changes with her memory.  She returns today for evaluation.  HISTORY 12/21/16 Melanie Bradley is a 67 year old female with a history of progressive memory disturbance.  She returns today for an evaluation.  Overall she feels that her memory has remained stable.  She continues on Aricept and Namenda.  She lives at home with her husband.  She is able to complete all ADLs independently.  She continues to help him mow the lawn.  She prepares meals without difficulty.  She does assist her husband with the finances.  She denies any trouble sleeping.  Denies any change in appetite.  No change in mood or behavior.  She states that she does walk about 2 miles a day.  She returns today for an evaluation.    REVIEW OF SYSTEMS: Out of a complete 14 system review of symptoms, the patient complains only of the following symptoms, and all other reviewed systems are negative.  See HPI  ALLERGIES: Allergies  Allergen Reactions  . Penicillins Hives and Swelling    Has patient had a PCN reaction causing IMMEDIATE RASH, FACIAL/TONGUE/THROAT SWELLING, SOB, OR LIGHTHEADEDNESS WITH HYPOTENSION:  #  #  #  YES  #  #  #  Has patient had a PCN reaction causing severe rash involving mucus membranes or skin necrosis: No Has patient had a PCN reaction that required hospitalization No Has patient had a PCN reaction occurring within the last 10  years: No If all of the above answers are "NO", then may proceed with Cephalosporin use.   . Benadryl [Diphenhydramine Hcl] Hives  . Msm [Methylsulfonylmethane] Hives  . Nubain [Nalbuphine Hcl] Hives  . Sulfa Antibiotics Hives  . Codeine     UNSPECIFIED REACTION [IF AT ALL] REFUSES TO TAKE    HOME MEDICATIONS: Outpatient Medications Prior to Visit  Medication Sig Dispense Refill  . amLODipine (NORVASC) 10 MG tablet Take 10 mg daily by mouth.    Marland Kitchen aspirin 81 MG tablet Take 81 mg by mouth daily.    . butalbital-aspirin-caffeine (FIORINAL) 50-325-40 MG capsule     . calcium carbonate (CALTRATE 600) 1500 (600 Ca) MG TABS tablet Take 600 mg of elemental calcium by mouth daily. Plus D3    . Cyanocobalamin (B-12 PO) Take 6,000 mcg by mouth daily.    Marland Kitchen donepezil (ARICEPT) 10 MG tablet Take 1 tablet (10 mg total) by mouth at bedtime. 90 tablet 3  . escitalopram (LEXAPRO) 10 MG tablet Take 10 mg by mouth every evening.   0  . levothyroxine (SYNTHROID, LEVOTHROID) 100 MCG tablet Take 100 mcg by mouth daily. **BRAND NAME ONLY** DOES NOT TAKE ON FRIDAY    . losartan (COZAAR) 100 MG tablet Take 100 mg by mouth daily.    . memantine (NAMENDA) 10 MG tablet Take 1 tablet (10 mg total) by mouth 2 (two) times daily. 180 tablet 3  . OVER  THE COUNTER MEDICATION Take 1 tablet by mouth daily. cognisense otc supplement    . alendronate (FOSAMAX) 70 MG tablet Take 70 mg by mouth once a week. Take with a full glass of water on an empty stomach.    . potassium chloride SA (K-DUR,KLOR-CON) 20 MEQ tablet Take 1 tablet (20 mEq total) by mouth 2 (two) times daily. 30 tablet 0   No facility-administered medications prior to visit.     PAST MEDICAL HISTORY: Past Medical History:  Diagnosis Date  . Atrial fibrillation ([place])   . Bloating   . Complication of anesthesia     " hard to wake up sometimes "  . Dementia   . Diverticula of colon   . Epigastric burning sensation   . GERD (gastroesophageal reflux  disease)   . Headache disorder 06/03/2014  . Headache(784.0)   . Helicobacter pylori gastritis 2004  . Hemorrhoids 06/26/03   Dr. Laural Golden tcs  . Hiatal hernia 06/26/03   Dr. Laural Golden egd  . Hypertension   . Hypothyroidism   . 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 Memory difficulty 06/03/2014  . Shoulder fracture, left   . Spastic colon   . Wrist fracture     PAST SURGICAL HISTORY: Past Surgical History:  Procedure Laterality Date  . APPENDECTOMY    . CHOLECYSTECTOMY    . COLONOSCOPY  06/26/03   Dr. Kem Boroughs diverticula at the sigmoid colon with one of the transverse colon, normal terminal ileoscopy, small external hemorrhoids the  . COLONOSCOPY N/A 01/20/2016   Procedure: COLONOSCOPY;  Surgeon: Daneil Dolin, MD;  [facility];  Service: Endoscopy;  Laterality: N/A;  2:30 pm  . ESOPHAGOGASTRODUODENOSCOPY  03/15/2011   Dr. Trevor Iha hiatal hernia, abnormal gastric mucosa of unclear significance. Gastric biopsies negative, no H. pylori.Procedure: ESOPHAGOGASTRODUODENOSCOPY (EGD);  Surgeon: Daneil Dolin, MD;  [facility];  Service: Endoscopy;  Laterality: N/A;  7:30  . ORIF HUMERUS FRACTURE Left 05/29/2016   Procedure: LEFT OPEN REDUCTION INTERNAL FIXATION (ORIF) PROXIMAL HUMERUS FRACTURE;  Surgeon: Meredith Pel, MD;  Location: [place];  Service: Orthopedics;  Laterality: Left;  . ORIF WRIST FRACTURE Left 05/29/2016   Procedure: OPEN REDUCTION INTERNAL FIXATION (ORIF) LEFT WRIST FRACTURE;  Surgeon: Meredith Pel, MD;  Location: [place];  Service: Orthopedics;  Laterality: Left;  . TUBAL LIGATION    . UPPER GASTROINTESTINAL ENDOSCOPY  06/26/03   Dr. Wynelle Bourgeois sliding hiatal hernia with 8 mm tongue of gastric type mucosa at distal esophagus bile in the stomach.    FAMILY HISTORY: Family History  Problem Relation Age of Onset  . Hypertension Mother   . Angina Mother   . COPD Father   . Heart failure Father   . Cancer Sister    . Dementia Sister   . Cardiomyopathy Brother   . Pneumonia Other   . Migraines Sister   . Colon cancer Neg Hx     SOCIAL HISTORY: Social History   Socioeconomic History  . Marital status: Married    Spouse name: Not on file  . Number of children: Not on file  . Years of education: 10  . Highest education level: Not on file  Occupational History  . Not on file  Social Needs  . Financial resource strain: Not on file  . Food insecurity:    Worry: Not on file    Inability: Not on file  . Transportation needs:    Medical: Not on  file    Non-medical: Not on file  Tobacco Use  . Smoking status: Current Every Day Smoker    Packs/day: 1.00    Years: 45.00    Pack years: 45.00    Types: Cigarettes    Start date: 09/10/1966  . Smokeless tobacco: Never Used  . Tobacco comment: 12/21/16 1 PPD  Substance and Sexual Activity  . Alcohol use: No    Alcohol/week: 0.0 oz  . Drug use: No  . Sexual activity: Yes  Lifestyle  . Physical activity:    Days per week: Not on file    Minutes per session: Not on file  . Stress: Not on file  Relationships  . Social connections:    Talks on phone: Not on file    Gets together: Not on file    Attends religious service: Not on file    Active member of club or organization: Not on file    Attends meetings of clubs or organizations: Not on file    Relationship status: Not on file  . Intimate partner violence:    Fear of current or ex partner: Not on file    Emotionally abused: Not on file    Physically abused: Not on file    Forced sexual activity: Not on file  Other Topics Concern  . Not on file  Social History Narrative   Patient is right handed.   Patient drinks 2 cups of caffeine daily.   Education [employer]      PHYSICAL EXAM  Vitals:   06/21/17 1332  BP: 114/74  Pulse: (!) 50  Weight: 112 lb (50.8 kg)  Height: 5\' 2"  (1.575 m)   Body mass index is 20.49 kg/m.   MMSE - Mini Mental State Exam 06/21/2017 12/21/2016  06/20/2016  Orientation to time 0 3 1  Orientation to Place 4 3 1   Registration 3 3 3   Attention/ Calculation 2 0 1  Recall 0 0 0  Language- name 2 objects 1 2 1   Language- repeat 1 1 1   Language- follow 3 step command 3 2 3   Language- follow 3 step command-comments - didn't put on floor -  Language- read & follow direction 1 1 1   Write a sentence 1 1 1   Copy design 0 0 0  Total score 16 16 13      Generalized: Well developed, in no acute distress   Neurological examination  Mentation: Alert oriented to time, place, history taking. Follows all commands speech and language fluent Cranial nerve II-XII: Pupils were equal round reactive to light. Extraocular movements were full, visual field were full on confrontational test. Facial sensation and strength were normal. Uvula tongue midline. Head turning and shoulder shrug  were normal and symmetric. Motor: The motor testing reveals 5 over 5 strength of all 4 extremities. Good symmetric motor tone is noted throughout.  Sensory: Sensory testing is intact to soft touch on all 4 extremities. No evidence of extinction is noted.  Coordination: Cerebellar testing reveals good finger-nose-finger and heel-to-shin bilaterally.  Gait and station: Gait is normal. Tandem gait is normal. Romberg is negative. No drift is seen.  Reflexes: Deep tendon reflexes are symmetric and normal bilaterally.   DIAGNOSTIC DATA (LABS, IMAGING, TESTING) - I reviewed patient records, labs, notes, testing and imaging myself where available.  Lab Results  Component Value Date   WBC 9.4 05/25/2016   HGB 10.7 (L) 05/25/2016   HCT 30.7 (L) 05/25/2016   MCV 91.4 05/25/2016   PLT 464 (  H) 05/25/2016      Component Value Date/Time   NA 130 (L) 05/25/2016 1227   NA 136 (A) 09/11/2006   K 3.2 (L) 05/25/2016 1227   CL 87 (L) 05/25/2016 1227   CO2 33 (H) 05/25/2016 1227   GLUCOSE 103 (H) 05/25/2016 1227   BUN 14 05/25/2016 1227   CREATININE 1.32 (H) 05/25/2016 1227    CREATININE 0.88 05/26/2011 0755   CALCIUM 9.2 05/25/2016 1227   PROT 7.0 05/25/2016 1227   ALBUMIN 3.4 (L) 05/25/2016 1227   AST 23 05/25/2016 1227   ALT 20 05/25/2016 1227   ALKPHOS 92 05/25/2016 1227   BILITOT 0.2 (L) 05/25/2016 1227   GFRNONAA 42 (L) 05/25/2016 1227   GFRAA 49 (L) 05/25/2016 1227    ASSESSMENT AND PLAN 67 y.o. year old female  has a past medical history of Atrial fibrillation ([place]), Bloating, Complication of anesthesia, Dementia, Diverticula of colon, Epigastric burning sensation, GERD (gastroesophageal reflux disease), Headache disorder (06/03/2014), NBVAPOLI(103.0), Helicobacter pylori gastritis (2004), Hemorrhoids (06/26/03), Hiatal hernia (06/26/03), Hypertension, Hypothyroidism, Left bundle branch block, Memory difficulty (06/03/2014), Shoulder fracture, left, Spastic colon, and Wrist fracture. here with:  1.  Memory disturbance  The patient's memory score has remained stable.  We will continue to monitor her memory.  She will continue on Aricept and Namenda.  I have advised that if her symptoms worsen or she develops new symptoms she should let us know.  She will follow-up in 6 months or sooner if needed.   I spent 15 minutes with the patient. 50% of this time was spent discussing the patient's memory score.   Ward Givens, MSN, NP-C 06/21/2017, 1:46 PM [facility] [address], [place], [place] [address] [phone]

## 2017-07-19 ENCOUNTER — Other Ambulatory Visit: Payer: Self-pay | Admitting: Neurology

## 2017-07-26 DIAGNOSIS — E559 Vitamin D deficiency, unspecified: Secondary | ICD-10-CM | POA: Diagnosis not present

## 2017-07-26 DIAGNOSIS — E039 Hypothyroidism, unspecified: Secondary | ICD-10-CM | POA: Diagnosis not present

## 2017-07-26 DIAGNOSIS — D4410 Neoplasm of uncertain behavior of unspecified adrenal gland: Secondary | ICD-10-CM | POA: Diagnosis not present

## 2017-07-26 DIAGNOSIS — M859 Disorder of bone density and structure, unspecified: Secondary | ICD-10-CM | POA: Diagnosis not present

## 2017-07-26 DIAGNOSIS — I10 Essential (primary) hypertension: Secondary | ICD-10-CM | POA: Diagnosis not present

## 2017-08-10 DIAGNOSIS — R413 Other amnesia: Secondary | ICD-10-CM | POA: Diagnosis not present

## 2017-08-10 DIAGNOSIS — I10 Essential (primary) hypertension: Secondary | ICD-10-CM | POA: Diagnosis not present

## 2017-08-10 DIAGNOSIS — Z0001 Encounter for general adult medical examination with abnormal findings: Secondary | ICD-10-CM | POA: Diagnosis not present

## 2017-08-10 DIAGNOSIS — Z719 Counseling, unspecified: Secondary | ICD-10-CM | POA: Diagnosis not present

## 2017-08-10 DIAGNOSIS — G43909 Migraine, unspecified, not intractable, without status migrainosus: Secondary | ICD-10-CM | POA: Diagnosis not present

## 2017-08-10 DIAGNOSIS — Z681 Body mass index (BMI) 19 or less, adult: Secondary | ICD-10-CM | POA: Diagnosis not present

## 2017-08-14 ENCOUNTER — Other Ambulatory Visit (HOSPITAL_COMMUNITY): Payer: Self-pay | Admitting: Family Medicine

## 2017-08-14 DIAGNOSIS — Z1231 Encounter for screening mammogram for malignant neoplasm of breast: Secondary | ICD-10-CM

## 2017-08-17 ENCOUNTER — Ambulatory Visit (HOSPITAL_COMMUNITY): Payer: BLUE CROSS/BLUE SHIELD

## 2017-08-23 ENCOUNTER — Ambulatory Visit (HOSPITAL_COMMUNITY)
Admission: RE | Admit: 2017-08-23 | Discharge: 2017-08-23 | Disposition: A | Discharge status: Home / Self Care | Payer: Medicare HMO | Source: Ambulatory Visit | Attending: Family Medicine | Admitting: Family Medicine

## 2017-08-23 ENCOUNTER — Other Ambulatory Visit (HOSPITAL_COMMUNITY): Payer: Self-pay | Admitting: Family Medicine

## 2017-08-23 DIAGNOSIS — Z1231 Encounter for screening mammogram for malignant neoplasm of breast: Secondary | ICD-10-CM

## 2017-08-30 ENCOUNTER — Other Ambulatory Visit (HOSPITAL_COMMUNITY): Payer: Self-pay | Admitting: Family Medicine

## 2017-08-30 DIAGNOSIS — F17210 Nicotine dependence, cigarettes, uncomplicated: Secondary | ICD-10-CM

## 2017-09-05 ENCOUNTER — Ambulatory Visit (HOSPITAL_COMMUNITY)
Admission: RE | Admit: 2017-09-05 | Discharge: 2017-09-05 | Disposition: A | Discharge status: Home / Self Care | Payer: Medicare HMO | Source: Ambulatory Visit | Attending: Family Medicine | Admitting: Family Medicine

## 2017-09-05 ENCOUNTER — Encounter (HOSPITAL_COMMUNITY): Payer: Self-pay

## 2017-09-26 DIAGNOSIS — E039 Hypothyroidism, unspecified: Secondary | ICD-10-CM | POA: Diagnosis not present

## 2017-10-22 ENCOUNTER — Other Ambulatory Visit: Payer: Self-pay | Admitting: Neurology

## 2017-10-23 ENCOUNTER — Other Ambulatory Visit: Payer: Self-pay | Admitting: Neurology

## 2017-10-25 DIAGNOSIS — Z01419 Encounter for gynecological examination (general) (routine) without abnormal findings: Secondary | ICD-10-CM | POA: Diagnosis not present

## 2017-10-30 ENCOUNTER — Other Ambulatory Visit: Payer: Self-pay | Admitting: Neurology

## 2017-11-06 ENCOUNTER — Other Ambulatory Visit: Payer: Self-pay | Admitting: Neurology

## 2017-11-12 ENCOUNTER — Other Ambulatory Visit: Payer: Self-pay | Admitting: Neurology

## 2017-11-21 ENCOUNTER — Other Ambulatory Visit: Payer: Self-pay | Admitting: Neurology

## 2017-12-05 ENCOUNTER — Other Ambulatory Visit: Payer: Self-pay | Admitting: Neurology

## 2017-12-26 ENCOUNTER — Encounter: Payer: Self-pay | Admitting: Adult Health

## 2017-12-26 ENCOUNTER — Ambulatory Visit: Payer: Medicare HMO | Admitting: Adult Health

## 2017-12-26 VITALS — BP 135/88 | HR 78 | Ht 62.0 in | Wt 105.0 lb

## 2017-12-26 DIAGNOSIS — R413 Other amnesia: Secondary | ICD-10-CM | POA: Diagnosis not present

## 2017-12-26 NOTE — Patient Instructions (Signed)
Your Plan: ° °Continue Aricept and namenda °Memory score is stable °If your symptoms worsen or you develop new symptoms please let us know.  ° ° °Thank you for coming to see us at [facility]. I hope we have been able to provide you high quality care today. ° °You may receive a patient satisfaction survey over the next few weeks. We would appreciate your feedback and comments so that we may continue to improve ourselves and the health of our patients. ° °

## 2017-12-26 NOTE — Progress Notes (Signed)
PATIENT: Melanie Bradley DOB: 1950-04-26  REASON FOR VISIT: follow up HISTORY FROM: patient  HISTORY OF PRESENT ILLNESS: Today 12/26/17:  [name] is a 67 year old female with a history of memory disturbance.  She returns today for follow-up.  She feels that her memory has remained stable.  She states that she tries remain active.  She likes to go fishing with her husband.  She is able to complete all ADLs independently.  She still cooks regularly.  Denies any trouble sleeping.  Reports good appetite.  Her husband manages the finances.  She continues on Aricept and Namenda.  She returns today for evaluation.  HISTORY 06/21/17 [name] is a 67 year old female with a history of memory disturbance.  She returns today for follow-up.  She reports that her memory has remained stable.  She continues on Aricept and Namenda.  She is able to complete all ADLs independently.  She lives at home with her husband.  He does manage most of the finances.  She reports that she continues to do all the cooking.  She also helps with yard work.  Her husband denies any significant changes with her memory.  She returns today for evaluation.  REVIEW OF SYSTEMS: Out of a complete 14 system review of symptoms, the patient complains only of the following symptoms, and all other reviewed systems are negative.  ALLERGIES: Allergies  Allergen Reactions  . Penicillins Hives and Swelling    Has patient had a PCN reaction causing IMMEDIATE RASH, FACIAL/TONGUE/THROAT SWELLING, SOB, OR LIGHTHEADEDNESS WITH HYPOTENSION:  #  #  #  YES  #  #  #  Has patient had a PCN reaction causing severe rash involving mucus membranes or skin necrosis: No Has patient had a PCN reaction that required hospitalization No Has patient had a PCN reaction occurring within the last 10 years: No If all of the above answers are "NO", then may proceed with Cephalosporin use.   . Benadryl [Diphenhydramine Hcl] Hives  . Msm  [Methylsulfonylmethane] Hives  . Nubain [Nalbuphine Hcl] Hives  . Sulfa Antibiotics Hives  . Codeine     UNSPECIFIED REACTION [IF AT ALL] REFUSES TO TAKE    HOME MEDICATIONS: Outpatient Medications Prior to Visit  Medication Sig Dispense Refill  . amLODipine (NORVASC) 10 MG tablet Take 10 mg daily by mouth.    Marland Kitchen aspirin 81 MG tablet Take 81 mg by mouth daily.    . calcium carbonate (CALTRATE 600) 1500 (600 Ca) MG TABS tablet Take 600 mg of elemental calcium by mouth daily. Plus D3    . Cyanocobalamin (B-12 PO) Take 6,000 mcg by mouth daily.    Marland Kitchen donepezil (ARICEPT) 10 MG tablet TAKE 1 TABLET BY MOUTH AT BEDTIME. 90 tablet 0  . escitalopram (LEXAPRO) 10 MG tablet Take 10 mg by mouth every evening.   0  . losartan (COZAAR) 100 MG tablet Take 100 mg by mouth daily.    . memantine (NAMENDA) 10 MG tablet TAKE 1 TABLET BY MOUTH TWICE DAILY. 180 tablet 0  . OVER THE COUNTER MEDICATION Take 1 tablet by mouth daily. cognisense otc supplement    . SYNTHROID 88 MCG tablet 88 mcg daily.    . butalbital-aspirin-caffeine (FIORINAL) 50-325-40 MG capsule     . levothyroxine (SYNTHROID, LEVOTHROID) 100 MCG tablet Take 100 mcg by mouth daily. **BRAND NAME ONLY** DOES NOT TAKE ON FRIDAY     No facility-administered medications prior to visit.     PAST MEDICAL HISTORY: Past Medical  History:  Diagnosis Date  . Atrial fibrillation ([place])   . Bloating   . Complication of anesthesia     " hard to wake up sometimes "  . Dementia ([place])   . Diverticula of colon   . Epigastric burning sensation   . GERD (gastroesophageal reflux disease)   . Headache disorder 06/03/2014  . Headache(784.0)   . Helicobacter pylori gastritis 2004  . Hemorrhoids 06/26/03   Dr. Laural Golden tcs  . Hiatal hernia 06/26/03   Dr. Laural Golden egd  . Hypertension   . Hypothyroidism   . 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 Memory difficulty 06/03/2014  . Shoulder fracture, left   . Spastic colon   . Wrist fracture       PAST SURGICAL HISTORY: Past Surgical History:  Procedure Laterality Date  . APPENDECTOMY    . CHOLECYSTECTOMY    . COLONOSCOPY  06/26/03   Dr. Kem Boroughs diverticula at the sigmoid colon with one of the transverse colon, normal terminal ileoscopy, small external hemorrhoids the  . COLONOSCOPY N/A 01/20/2016   Procedure: COLONOSCOPY;  Surgeon: Daneil Dolin, MD;  [facility];  Service: Endoscopy;  Laterality: N/A;  2:30 pm  . ESOPHAGOGASTRODUODENOSCOPY  03/15/2011   Dr. Trevor Iha hiatal hernia, abnormal gastric mucosa of unclear significance. Gastric biopsies negative, no H. pylori.Procedure: ESOPHAGOGASTRODUODENOSCOPY (EGD);  Surgeon: Daneil Dolin, MD;  [facility];  Service: Endoscopy;  Laterality: N/A;  7:30  . ORIF HUMERUS FRACTURE Left 05/29/2016   Procedure: LEFT OPEN REDUCTION INTERNAL FIXATION (ORIF) PROXIMAL HUMERUS FRACTURE;  Surgeon: Meredith Pel, MD;  Location: [place];  Service: Orthopedics;  Laterality: Left;  . ORIF WRIST FRACTURE Left 05/29/2016   Procedure: OPEN REDUCTION INTERNAL FIXATION (ORIF) LEFT WRIST FRACTURE;  Surgeon: Meredith Pel, MD;  Location: [place];  Service: Orthopedics;  Laterality: Left;  . TUBAL LIGATION    . UPPER GASTROINTESTINAL ENDOSCOPY  06/26/03   Dr. Wynelle Bourgeois sliding hiatal hernia with 8 mm tongue of gastric type mucosa at distal esophagus bile in the stomach.    FAMILY HISTORY: Family History  Problem Relation Age of Onset  . Hypertension Mother   . Angina Mother   . COPD Father   . Heart failure Father   . Cancer Sister   . Dementia Sister   . Cardiomyopathy Brother   . Pneumonia Other   . Migraines Sister   . Colon cancer Neg Hx     SOCIAL HISTORY: Social History   Socioeconomic History  . Marital status: Married    Spouse name: Not on file  . Number of children: Not on file  . Years of education: 72  . Highest education level: Not on file  Occupational History  . Not on file   Social Needs  . Financial resource strain: Not on file  . Food insecurity:    Worry: Not on file    Inability: Not on file  . Transportation needs:    Medical: Not on file    Non-medical: Not on file  Tobacco Use  . Smoking status: Current Every Day Smoker    Packs/day: 1.00    Years: 45.00    Pack years: 45.00    Types: Cigarettes    Start date: 09/10/1966  . Smokeless tobacco: Never Used  . Tobacco comment: 12/21/16 1 PPD  Substance and Sexual Activity  . Alcohol use: No    Alcohol/week: 0.0 standard drinks  . Drug use: No  .  Sexual activity: Yes  Lifestyle  . Physical activity:    Days per week: Not on file    Minutes per session: Not on file  . Stress: Not on file  Relationships  . Social connections:    Talks on phone: Not on file    Gets together: Not on file    Attends religious service: Not on file    Active member of club or organization: Not on file    Attends meetings of clubs or organizations: Not on file    Relationship status: Not on file  . Intimate partner violence:    Fear of current or ex partner: Not on file    Emotionally abused: Not on file    Physically abused: Not on file    Forced sexual activity: Not on file  Other Topics Concern  . Not on file  Social History Narrative   Patient is right handed.   Patient drinks 2 cups of caffeine daily.   Education [employer]      PHYSICAL EXAM  Vitals:   12/26/17 1252  BP: 135/88  Pulse: 78  Weight: 105 lb (47.6 kg)  Height: 5\' 2"  (1.575 m)   Body mass index is 19.2 kg/m.   MMSE - Mini Mental State Exam 12/26/2017 06/21/2017 12/21/2016  Orientation to time 2 0 3  Orientation to Place 4 4 3   Registration 3 3 3   Attention/ Calculation 0 2 0  Recall 0 0 0  Language- name 2 objects 1 1 2   Language- repeat 1 1 1   Language- follow 3 step command 3 3 2   Language- follow 3 step command-comments - - didn't put on floor  Language- read & follow direction 1 1 1   Write a sentence 1 1 1   Copy  design 1 0 0  Total score 17 16 16      Generalized: Well developed, in no acute distress   Neurological examination  Mentation: Alert oriented to time, place, history taking. Follows all commands speech and language fluent Cranial nerve II-XII: Pupils were equal round reactive to light. Extraocular movements were full, visual field were full on confrontational test. Facial sensation and strength were normal. Uvula tongue midline. Head turning and shoulder shrug  were normal and symmetric. Motor: The motor testing reveals 5 over 5 strength of all 4 extremities. Good symmetric motor tone is noted throughout.  Sensory: Sensory testing is intact to soft touch on all 4 extremities. No evidence of extinction is noted.  Coordination: Cerebellar testing reveals good finger-nose-finger and heel-to-shin bilaterally.  Gait and station: Gait is normal.  Reflexes: Deep tendon reflexes are symmetric and normal bilaterally.   DIAGNOSTIC DATA (LABS, IMAGING, TESTING) - I reviewed patient records, labs, notes, testing and imaging myself where available.  Lab Results  Component Value Date   WBC 9.4 05/25/2016   HGB 10.7 (L) 05/25/2016   HCT 30.7 (L) 05/25/2016   MCV 91.4 05/25/2016   PLT 464 (H) 05/25/2016      Component Value Date/Time   NA 130 (L) 05/25/2016 1227   NA 136 (A) 09/11/2006   K 3.2 (L) 05/25/2016 1227   CL 87 (L) 05/25/2016 1227   CO2 33 (H) 05/25/2016 1227   GLUCOSE 103 (H) 05/25/2016 1227   BUN 14 05/25/2016 1227   CREATININE 1.32 (H) 05/25/2016 1227   CREATININE 0.88 05/26/2011 0755   CALCIUM 9.2 05/25/2016 1227   PROT 7.0 05/25/2016 1227   ALBUMIN 3.4 (L) 05/25/2016 1227   AST 23 05/25/2016  1227   ALT 20 05/25/2016 1227   ALKPHOS 92 05/25/2016 1227   BILITOT 0.2 (L) 05/25/2016 1227   GFRNONAA 42 (L) 05/25/2016 1227   GFRAA 49 (L) 05/25/2016 1227   Lab Results  Component Value Date   CHOL 173 05/09/2013   HDL 55 05/09/2013   LDLCALC 101 (H) 05/09/2013   TRIG 83  05/09/2013   CHOLHDL 3.1 05/09/2013   No results found for: HGBA1C Lab Results  Component Value Date   VITAMINB12 239 06/03/2014   Lab Results  Component Value Date   TSH 1.514 05/08/2013      ASSESSMENT AND PLAN 67 y.o. year old female  has a past medical history of Atrial fibrillation ([place]), Bloating, Complication of anesthesia, Dementia ([place]), Diverticula of colon, Epigastric burning sensation, GERD (gastroesophageal reflux disease), Headache disorder (06/03/2014), NOTRRNHA(579.0), Helicobacter pylori gastritis (2004), Hemorrhoids (06/26/03), Hiatal hernia (06/26/03), Hypertension, Hypothyroidism, Left bundle branch block, Memory difficulty (06/03/2014), Shoulder fracture, left, Spastic colon, and Wrist fracture. here with:  1.  Memory disturbance  The patient's memory score has remained stable.  She will continue on Aricept and Namenda.  I have advised that if her symptoms worsen or she develops new symptoms she should let us know.  She will follow-up in 6 months or sooner if needed.   I spent 15 minutes with the patient. 50% of this time was spent reviewing her memory score   Ward Givens, MSN, NP-C 12/26/2017, 1:21 PM [facility] [address], [place], [place] [address] [phone]

## 2017-12-26 NOTE — Progress Notes (Signed)
I have read the note, and I agree with the clinical assessment and plan.  Melanie Bradley   

## 2018-02-04 DIAGNOSIS — Z681 Body mass index (BMI) 19 or less, adult: Secondary | ICD-10-CM | POA: Diagnosis not present

## 2018-02-04 DIAGNOSIS — Z1389 Encounter for screening for other disorder: Secondary | ICD-10-CM | POA: Diagnosis not present

## 2018-02-04 DIAGNOSIS — J449 Chronic obstructive pulmonary disease, unspecified: Secondary | ICD-10-CM | POA: Diagnosis not present

## 2018-02-04 DIAGNOSIS — R05 Cough: Secondary | ICD-10-CM | POA: Diagnosis not present

## 2018-02-04 DIAGNOSIS — J343 Hypertrophy of nasal turbinates: Secondary | ICD-10-CM | POA: Diagnosis not present

## 2018-02-20 ENCOUNTER — Emergency Department (HOSPITAL_COMMUNITY): Payer: Medicare HMO

## 2018-02-20 ENCOUNTER — Other Ambulatory Visit: Payer: Self-pay | Admitting: Adult Health

## 2018-02-20 ENCOUNTER — Other Ambulatory Visit: Payer: Self-pay

## 2018-02-20 ENCOUNTER — Inpatient Hospital Stay (HOSPITAL_COMMUNITY)
Admission: EM | Admit: 2018-02-20 | Discharge: 2018-02-22 | DRG: 872 | Disposition: A | Discharge status: Home / Self Care | Payer: Medicare HMO | Attending: Internal Medicine | Admitting: Internal Medicine

## 2018-02-20 ENCOUNTER — Encounter (HOSPITAL_COMMUNITY): Payer: Self-pay | Admitting: Emergency Medicine

## 2018-02-20 DIAGNOSIS — F0390 Unspecified dementia without behavioral disturbance: Secondary | ICD-10-CM | POA: Diagnosis present

## 2018-02-20 DIAGNOSIS — K5730 Diverticulosis of large intestine without perforation or abscess without bleeding: Secondary | ICD-10-CM | POA: Diagnosis not present

## 2018-02-20 DIAGNOSIS — I447 Left bundle-branch block, unspecified: Secondary | ICD-10-CM | POA: Diagnosis present

## 2018-02-20 DIAGNOSIS — K219 Gastro-esophageal reflux disease without esophagitis: Secondary | ICD-10-CM | POA: Diagnosis present

## 2018-02-20 DIAGNOSIS — D72829 Elevated white blood cell count, unspecified: Secondary | ICD-10-CM | POA: Diagnosis not present

## 2018-02-20 DIAGNOSIS — K529 Noninfective gastroenteritis and colitis, unspecified: Secondary | ICD-10-CM | POA: Diagnosis not present

## 2018-02-20 DIAGNOSIS — Z885 Allergy status to narcotic agent status: Secondary | ICD-10-CM

## 2018-02-20 DIAGNOSIS — I959 Hypotension, unspecified: Secondary | ICD-10-CM

## 2018-02-20 DIAGNOSIS — R000 Tachycardia, unspecified: Secondary | ICD-10-CM | POA: Diagnosis not present

## 2018-02-20 DIAGNOSIS — E039 Hypothyroidism, unspecified: Secondary | ICD-10-CM | POA: Diagnosis present

## 2018-02-20 DIAGNOSIS — A0472 Enterocolitis due to Clostridium difficile, not specified as recurrent: Secondary | ICD-10-CM | POA: Diagnosis not present

## 2018-02-20 DIAGNOSIS — A419 Sepsis, unspecified organism: Principal | ICD-10-CM | POA: Diagnosis present

## 2018-02-20 DIAGNOSIS — Z880 Allergy status to penicillin: Secondary | ICD-10-CM

## 2018-02-20 DIAGNOSIS — R531 Weakness: Secondary | ICD-10-CM | POA: Diagnosis not present

## 2018-02-20 DIAGNOSIS — I4891 Unspecified atrial fibrillation: Secondary | ICD-10-CM | POA: Diagnosis present

## 2018-02-20 DIAGNOSIS — Z7982 Long term (current) use of aspirin: Secondary | ICD-10-CM

## 2018-02-20 DIAGNOSIS — F17210 Nicotine dependence, cigarettes, uncomplicated: Secondary | ICD-10-CM | POA: Diagnosis present

## 2018-02-20 DIAGNOSIS — Z888 Allergy status to other drugs, medicaments and biological substances status: Secondary | ICD-10-CM

## 2018-02-20 DIAGNOSIS — Z79899 Other long term (current) drug therapy: Secondary | ICD-10-CM

## 2018-02-20 DIAGNOSIS — Z882 Allergy status to sulfonamides status: Secondary | ICD-10-CM

## 2018-02-20 DIAGNOSIS — I10 Essential (primary) hypertension: Secondary | ICD-10-CM | POA: Diagnosis present

## 2018-02-20 DIAGNOSIS — R197 Diarrhea, unspecified: Secondary | ICD-10-CM | POA: Diagnosis present

## 2018-02-20 DIAGNOSIS — R05 Cough: Secondary | ICD-10-CM | POA: Diagnosis not present

## 2018-02-20 DIAGNOSIS — R69 Illness, unspecified: Secondary | ICD-10-CM | POA: Diagnosis not present

## 2018-02-20 DIAGNOSIS — E860 Dehydration: Secondary | ICD-10-CM | POA: Diagnosis present

## 2018-02-20 DIAGNOSIS — R1110 Vomiting, unspecified: Secondary | ICD-10-CM | POA: Diagnosis present

## 2018-02-20 MED ORDER — SODIUM CHLORIDE 0.9 % IV BOLUS
500.0000 mL | Freq: Once | INTRAVENOUS | Status: AC
  Administered 2018-02-21: 500 mL via INTRAVENOUS

## 2018-02-20 MED ORDER — SODIUM CHLORIDE 0.9 % IV BOLUS: 1000.0000 mL | Freq: Once | INTRAVENOUS | Status: DC

## 2018-02-20 MED ORDER — SODIUM CHLORIDE 0.9 % IV BOLUS
1000.0000 mL | Freq: Once | INTRAVENOUS | Status: AC
  Administered 2018-02-21: 1000 mL via INTRAVENOUS

## 2018-02-20 NOTE — ED Triage Notes (Signed)
Per family pt started having n/v/d this afternoon. Husband states she hasnt been eating good in a few days.

## 2018-02-20 NOTE — ED Provider Notes (Signed)
[facility] EMERGENCY DEPARTMENT Provider Note   CSN: [id number] Arrival date & time: 02/20/18  2236     History   Chief Complaint Chief Complaint  Patient presents with  . Emesis    HPI Melanie Bradley is a 68 y.o. female with a past medical history of hypertension, GERD, dementia who presents to ED for 1 week history of decreased appetite, generalized weakness.  She began having nausea, 2 episodes of nonbloody, nonbilious emesis and diarrhea about 2 hours prior to arrival.  No specific food trigger to cause the symptoms today.  Husband at bedside states that patient was having trouble walking secondary to generalized weakness.  He is unsure what specifically caused a decreased appetite.  She has been complaining of a cough for which she was given a Z-Pak and then Levaquin for.  States that the cough has improved but now she has decreased appetite.  Patient denies any symptoms at all.  Sister and husband at bedside states that patient is at her mental baseline.  Denies any injuries or falls, chest pain, abdominal pain, fever.  She has been taking her medications as prescribed.  HPI  Past Medical History:  Diagnosis Date  . Atrial fibrillation ([place])   . Bloating   . Complication of anesthesia     " hard to wake up sometimes "  . Dementia ([place])   . Diverticula of colon   . Epigastric burning sensation   . GERD (gastroesophageal reflux disease)   . Headache disorder 06/03/2014  . Headache(784.0)   . Helicobacter pylori gastritis 2004  . Hemorrhoids 06/26/03   Dr. Laural Golden tcs  . Hiatal hernia 06/26/03   Dr. Laural Golden egd  . Hypertension   . Hypothyroidism   . 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 Memory difficulty 06/03/2014  . Shoulder fracture, left   . Spastic colon   . Wrist fracture     Patient Active Problem List   Diagnosis Date Noted  . Wrist fracture 06/26/2016  . Humeral fracture 06/26/2016  . Encounter for screening colonoscopy 12/31/2015  .  Memory difficulty 06/03/2014  . Headache disorder 06/03/2014  . Atrial flutter ([place]) 05/09/2013  . COPD (chronic obstructive pulmonary disease) ([place]) 05/09/2013  . Tobacco abuse 05/08/2013  . LBBB (left bundle branch block)- intermittent 05/08/2013  . Chronic diarrhea 05/11/2011  . Chronic nausea 05/11/2011  . Dyspepsia 02/24/2011  . IBS (irritable bowel syndrome) 02/24/2011  . Chest pain 12/05/2010  . Hypertension 12/05/2010    Past Surgical History:  Procedure Laterality Date  . APPENDECTOMY    . CHOLECYSTECTOMY    . COLONOSCOPY  06/26/03   Dr. Kem Boroughs diverticula at the sigmoid colon with one of the transverse colon, normal terminal ileoscopy, small external hemorrhoids the  . COLONOSCOPY N/A 01/20/2016   Procedure: COLONOSCOPY;  Surgeon: Daneil Dolin, MD;  [facility];  Service: Endoscopy;  Laterality: N/A;  2:30 pm  . ESOPHAGOGASTRODUODENOSCOPY  03/15/2011   Dr. Trevor Iha hiatal hernia, abnormal gastric mucosa of unclear significance. Gastric biopsies negative, no H. pylori.Procedure: ESOPHAGOGASTRODUODENOSCOPY (EGD);  Surgeon: Daneil Dolin, MD;  Location: [facility];  Service: Endoscopy;  Laterality: N/A;  7:30  . ORIF HUMERUS FRACTURE Left 05/29/2016   Procedure: LEFT OPEN REDUCTION INTERNAL FIXATION (ORIF) PROXIMAL HUMERUS FRACTURE;  Surgeon: Meredith Pel, MD;  Location: [place];  Service: Orthopedics;  Laterality: Left;  . ORIF WRIST FRACTURE Left 05/29/2016   Procedure: OPEN REDUCTION INTERNAL FIXATION (ORIF)  LEFT WRIST FRACTURE;  Surgeon: Meredith Pel, MD;  Location: [place];  Service: Orthopedics;  Laterality: Left;  . TUBAL LIGATION    . UPPER GASTROINTESTINAL ENDOSCOPY  06/26/03   Dr. Wynelle Bourgeois sliding hiatal hernia with 8 mm tongue of gastric type mucosa at distal esophagus bile in the stomach.     OB History   No obstetric history on file.      Home Medications    Prior to Admission medications   Medication Sig Start Date  End Date Taking? Authorizing Provider  amLODipine (NORVASC) 10 MG tablet Take 10 mg daily by mouth.    [provider]  aspirin 81 MG tablet Take 81 mg by mouth daily.    [provider]  butalbital-aspirin-caffeine Acquanetta Chain) [phone] MG capsule  06/04/17   [provider]  calcium carbonate (CALTRATE 600) 1500 (600 Ca) MG TABS tablet Take 600 mg of elemental calcium by mouth daily. Plus D3    [provider]  Cyanocobalamin (B-12 PO) Take 6,000 mcg by mouth daily.    [provider]  donepezil (ARICEPT) 10 MG tablet TAKE 1 TABLET BY MOUTH AT BEDTIME. 02/20/18   Kathrynn Ducking, MD  escitalopram (LEXAPRO) 10 MG tablet Take 10 mg by mouth every evening.  10/16/14   [provider]  losartan (COZAAR) 100 MG tablet Take 100 mg by mouth daily.    [provider]  memantine (NAMENDA) 10 MG tablet TAKE 1 TABLET BY MOUTH TWICE DAILY. 02/20/18   Kathrynn Ducking, MD  OVER THE COUNTER MEDICATION Take 1 tablet by mouth daily. cognisense otc supplement    [provider]  SYNTHROID 88 MCG tablet 88 mcg daily. 12/15/17   [provider]    Family History Family History  Problem Relation Age of Onset  . Hypertension Mother   . Angina Mother   . COPD Father   . Heart failure Father   . Cancer Sister   . Dementia Sister   . Cardiomyopathy Brother   . Pneumonia Other   . Migraines Sister   . Colon cancer Neg Hx     Social History Social History   Tobacco Use  . Smoking status: Current Every Day Smoker    Packs/day: 1.00    Years: 45.00    Pack years: 45.00    Types: Cigarettes    Start date: 09/10/1966  . Smokeless tobacco: Never Used  . Tobacco comment: 12/21/16 1 PPD  Substance Use Topics  . Alcohol use: No    Alcohol/week: 0.0 standard drinks  . Drug use: No     Allergies   Penicillins; Benadryl [diphenhydramine hcl]; Msm [methylsulfonylmethane]; Nubain [nalbuphine hcl]; Sulfa antibiotics; and  Codeine   Review of Systems Review of Systems  Unable to perform ROS: Dementia  Constitutional: Negative for fever.  Gastrointestinal: Positive for diarrhea, nausea and vomiting. Negative for abdominal pain.     Physical Exam Updated Vital Signs BP 103/72   Pulse (!) 51   Temp 98.2 F (36.8 C) (Oral)   Resp 20   SpO2 99%   Physical Exam Vitals signs and nursing note reviewed.  Constitutional:      General: She is not in acute distress.    Appearance: She is well-developed.     Comments: Pleasant. NAD.  HENT:     Head: Normocephalic and atraumatic.     Nose: Nose normal.  Eyes:     General: No scleral icterus.       Right eye:  No discharge.        Left eye: No discharge.     Conjunctiva/sclera: Conjunctivae normal.     Pupils: Pupils are equal, round, and reactive to light.  Neck:     Musculoskeletal: Normal range of motion and neck supple.  Cardiovascular:     Rate and Rhythm: Normal rate and regular rhythm.     Heart sounds: Normal heart sounds. No murmur. No friction rub. No gallop.   Pulmonary:     Effort: Pulmonary effort is normal. No respiratory distress.     Breath sounds: Normal breath sounds.  Abdominal:     General: Bowel sounds are normal. There is no distension.     Palpations: Abdomen is soft.     Tenderness: There is no abdominal tenderness. There is no guarding.     Comments: No abdominal tenderness to palpation.  Musculoskeletal: Normal range of motion.  Skin:    General: Skin is warm and dry.     Findings: No rash.  Neurological:     Mental Status: She is alert.     Motor: No abnormal muscle tone.     Coordination: Coordination normal.     Comments: Oriented to self, place, situation.  Believes it is the year 2013. Pupils reactive. No facial asymmetry noted. Cranial nerves appear grossly intact. Sensation intact to light touch on face, BUE and BLE. Strength 5/5 in BUE and BLE.       ED Treatments / Results  Labs (all labs ordered are  listed, but only abnormal results are displayed) Labs Reviewed  COMPREHENSIVE METABOLIC PANEL - Abnormal; Notable for the following components:      Result Value   Sodium 132 (*)    Potassium 2.9 (*)    Chloride 94 (*)    Glucose, Bld 117 (*)    Creatinine, Ser 1.27 (*)    Calcium 8.8 (*)    Albumin 3.3 (*)    GFR calc non Af Amer 44 (*)    GFR calc Af Amer 51 (*)    All other components within normal limits  CBC WITH DIFFERENTIAL/PLATELET - Abnormal; Notable for the following components:   WBC 32.9 (*)    Platelets 428 (*)    Neutro Abs 27.4 (*)    Monocytes Absolute 2.8 (*)    Abs Immature Granulocytes 1.55 (*)    All other components within normal limits  I-STAT CG4 LACTIC ACID, ED - Abnormal; Notable for the following components:   Lactic Acid, Venous 3.30 (*)    All other components within normal limits  URINE CULTURE  CULTURE, BLOOD (ROUTINE X 2)  CULTURE, BLOOD (ROUTINE X 2)  LIPASE, BLOOD  URINALYSIS, ROUTINE W REFLEX MICROSCOPIC    EKG EKG Interpretation  Date/Time:  Wednesday February 20 2018 23:38:52 EST Ventricular Rate:  104 PR Interval:    QRS Duration: 138 QT Interval:  369 QTC Calculation: 462 R Axis:   7 Text Interpretation:  Sinus tachycardia Multiple ventricular premature complexes Confirmed by Veryl Speak [phone]) on 02/21/2018 12:28:38 AM   Radiology No results found.  Procedures Procedures (including critical care time)  Medications Ordered in ED Medications  sodium chloride 0.9 % bolus 1,000 mL (1,000 mLs Intravenous New Bag/Given 02/21/18 0021)  sodium chloride 0.9 % bolus 500 mL (500 mLs Intravenous New Bag/Given 02/21/18 0020)  aztreonam (AZACTAM) 2 g in sodium chloride 0.9 % 100 mL IVPB (has no administration in time range)  metroNIDAZOLE (FLAGYL) IVPB 500 mg (has no administration in time  range)  vancomycin (VANCOCIN) IVPB 1000 mg/200 mL premix (has no administration in time range)  potassium chloride SA (K-DUR,KLOR-CON) CR tablet 40 mEq  (40 mEq Oral Given 02/21/18 0051)     Initial Impression / Assessment and Plan / ED Course  I have reviewed the triage vital signs and the nursing notes.  Pertinent labs & imaging results that were available during my care of the patient were reviewed by me and considered in my medical decision making (see chart for details).  Clinical Course as of Feb 22 52  Wed Feb 20, 2018  2346 BP(!): 82/60 [HK]  Thu Feb 21, 2018  0045 BP: 103/72 [HK]  0045 Lactic Acid, Venous(!!): 3.30 [HK]    Clinical Course User Index [HK] Delia Heady, PA-C    68 year old female presents to ED for decreased appetite for the past week.  She began having nausea, 2 episodes of nonbloody, nonbilious emesis and diarrhea about 2 hours prior to arrival.  She was recently treated for URI with 2 rounds of antibiotics.  Unsure if this caused a decreased appetite.  Patient has history of dementia, she is not complaining of any symptoms.  Abdomen is soft, nontender nondistended.  Patient hypotensive to 53G systolic, improved to 82 systolic on recheck.  Other vital signs are within normal limits.  She is afebrile with no recent use of antipyretics.  She denies any blood in stool or hematemesis.  Plan is to obtain lab work, give fluids and reassess.  Lactic elevated to 3.3.  Blood pressure improved.  Hypokalemia noted at 2.9, creatinine is 1.27 which is similar to baseline.  Will replete potassium and wait on remainder of labs. Care handed off to Dr. Stark Jock.  Final Clinical Impressions(s) / ED Diagnoses   Final diagnoses:  None    ED Discharge Orders    None       Delia Heady, PA-C 02/21/18 [id number]    Veryl Speak, MD 02/21/18 [phone]

## 2018-02-21 ENCOUNTER — Encounter (HOSPITAL_COMMUNITY): Payer: Self-pay | Admitting: *Deleted

## 2018-02-21 ENCOUNTER — Emergency Department (HOSPITAL_COMMUNITY): Payer: Medicare HMO

## 2018-02-21 ENCOUNTER — Other Ambulatory Visit: Payer: Self-pay

## 2018-02-21 DIAGNOSIS — Z79899 Other long term (current) drug therapy: Secondary | ICD-10-CM | POA: Diagnosis not present

## 2018-02-21 DIAGNOSIS — F0390 Unspecified dementia without behavioral disturbance: Secondary | ICD-10-CM

## 2018-02-21 DIAGNOSIS — I959 Hypotension, unspecified: Secondary | ICD-10-CM | POA: Diagnosis not present

## 2018-02-21 DIAGNOSIS — Z888 Allergy status to other drugs, medicaments and biological substances status: Secondary | ICD-10-CM | POA: Diagnosis not present

## 2018-02-21 DIAGNOSIS — R197 Diarrhea, unspecified: Secondary | ICD-10-CM | POA: Diagnosis present

## 2018-02-21 DIAGNOSIS — Z882 Allergy status to sulfonamides status: Secondary | ICD-10-CM | POA: Diagnosis not present

## 2018-02-21 DIAGNOSIS — Z7982 Long term (current) use of aspirin: Secondary | ICD-10-CM | POA: Diagnosis not present

## 2018-02-21 DIAGNOSIS — A0472 Enterocolitis due to Clostridium difficile, not specified as recurrent: Secondary | ICD-10-CM | POA: Diagnosis not present

## 2018-02-21 DIAGNOSIS — R1110 Vomiting, unspecified: Secondary | ICD-10-CM | POA: Diagnosis present

## 2018-02-21 DIAGNOSIS — A419 Sepsis, unspecified organism: Secondary | ICD-10-CM | POA: Diagnosis present

## 2018-02-21 DIAGNOSIS — I10 Essential (primary) hypertension: Secondary | ICD-10-CM | POA: Diagnosis present

## 2018-02-21 DIAGNOSIS — R69 Illness, unspecified: Secondary | ICD-10-CM | POA: Diagnosis not present

## 2018-02-21 DIAGNOSIS — Z885 Allergy status to narcotic agent status: Secondary | ICD-10-CM | POA: Diagnosis not present

## 2018-02-21 DIAGNOSIS — I4891 Unspecified atrial fibrillation: Secondary | ICD-10-CM | POA: Diagnosis present

## 2018-02-21 DIAGNOSIS — R05 Cough: Secondary | ICD-10-CM | POA: Diagnosis not present

## 2018-02-21 DIAGNOSIS — E039 Hypothyroidism, unspecified: Secondary | ICD-10-CM

## 2018-02-21 DIAGNOSIS — E860 Dehydration: Secondary | ICD-10-CM | POA: Diagnosis present

## 2018-02-21 DIAGNOSIS — K5730 Diverticulosis of large intestine without perforation or abscess without bleeding: Secondary | ICD-10-CM | POA: Diagnosis not present

## 2018-02-21 DIAGNOSIS — R531 Weakness: Secondary | ICD-10-CM | POA: Diagnosis not present

## 2018-02-21 DIAGNOSIS — Z880 Allergy status to penicillin: Secondary | ICD-10-CM | POA: Diagnosis not present

## 2018-02-21 DIAGNOSIS — D72829 Elevated white blood cell count, unspecified: Secondary | ICD-10-CM | POA: Diagnosis not present

## 2018-02-21 DIAGNOSIS — K529 Noninfective gastroenteritis and colitis, unspecified: Secondary | ICD-10-CM

## 2018-02-21 DIAGNOSIS — K219 Gastro-esophageal reflux disease without esophagitis: Secondary | ICD-10-CM | POA: Diagnosis present

## 2018-02-21 DIAGNOSIS — F17210 Nicotine dependence, cigarettes, uncomplicated: Secondary | ICD-10-CM | POA: Diagnosis present

## 2018-02-21 DIAGNOSIS — I447 Left bundle-branch block, unspecified: Secondary | ICD-10-CM | POA: Diagnosis present

## 2018-02-21 LAB — COMPREHENSIVE METABOLIC PANEL
ALT: 13 U/L (ref 0–44)
AST: 21 U/L (ref 15–41)
Albumin: 3.3 g/dL — ABNORMAL LOW (ref 3.5–5.0)
Alkaline Phosphatase: 85 U/L (ref 38–126)
Anion gap: 13 (ref 5–15)
BILIRUBIN TOTAL: 0.5 mg/dL (ref 0.3–1.2)
BUN: 15 mg/dL (ref 8–23)
CHLORIDE: 94 mmol/L — AB (ref 98–111)
CO2: 25 mmol/L (ref 22–32)
CREATININE: 1.27 mg/dL — AB (ref 0.44–1.00)
Calcium: 8.8 mg/dL — ABNORMAL LOW (ref 8.9–10.3)
GFR, EST AFRICAN AMERICAN: 51 mL/min — AB (ref >60)
GFR, EST NON AFRICAN AMERICAN: 44 mL/min — AB (ref >60)
Glucose, Bld: 117 mg/dL — ABNORMAL HIGH (ref 70–99)
POTASSIUM: 2.9 mmol/L — AB (ref 3.5–5.1)
Sodium: 132 mmol/L — ABNORMAL LOW (ref 135–145)
TOTAL PROTEIN: 7.4 g/dL (ref 6.5–8.1)

## 2018-02-21 LAB — LIPASE, BLOOD: Lipase: 31 U/L (ref 11–51)

## 2018-02-21 LAB — CBC WITH DIFFERENTIAL/PLATELET
ABS IMMATURE GRANULOCYTES: 1.55 10*3/uL — AB (ref 0.00–0.07)
BASOS ABS: 0.1 10*3/uL (ref 0.0–0.1)
Basophils Relative: 0 %
Eosinophils Absolute: 0 10*3/uL (ref 0.0–0.5)
Eosinophils Relative: 0 %
HEMATOCRIT: 42.2 % (ref 36.0–46.0)
HEMOGLOBIN: 13.5 g/dL (ref 12.0–15.0)
IMMATURE GRANULOCYTES: 5 %
LYMPHS ABS: 0.9 10*3/uL (ref 0.7–4.0)
LYMPHS PCT: 3 %
MCH: 30.8 pg (ref 26.0–34.0)
MCHC: 32 g/dL (ref 30.0–36.0)
MCV: 96.1 fL (ref 80.0–100.0)
MONOS PCT: 9 %
Monocytes Absolute: 2.8 10*3/uL — ABNORMAL HIGH (ref 0.1–1.0)
NEUTROS ABS: 27.4 10*3/uL — AB (ref 1.7–7.7)
Neutrophils Relative %: 83 %
Platelets: 428 10*3/uL — ABNORMAL HIGH (ref 150–400)
RBC: 4.39 MIL/uL (ref 3.87–5.11)
RDW: 13.7 % (ref 11.5–15.5)
WBC: 32.9 10*3/uL — ABNORMAL HIGH (ref 4.0–10.5)
nRBC: 0 % (ref 0.0–0.2)

## 2018-02-21 LAB — I-STAT CG4 LACTIC ACID, ED
Lactic Acid, Venous: 1.84 mmol/L (ref 0.5–1.9)
Lactic Acid, Venous: 3.3 mmol/L (ref 0.5–1.9)

## 2018-02-21 LAB — URINALYSIS, ROUTINE W REFLEX MICROSCOPIC
Bacteria, UA: NONE SEEN
Bilirubin Urine: NEGATIVE
GLUCOSE, UA: NEGATIVE mg/dL
Ketones, ur: NEGATIVE mg/dL
Leukocytes, UA: NEGATIVE
NITRITE: NEGATIVE
PH: 5 (ref 5.0–8.0)
Protein, ur: NEGATIVE mg/dL
SPECIFIC GRAVITY, URINE: 1.006 (ref 1.005–1.030)

## 2018-02-21 LAB — INFLUENZA PANEL BY PCR (TYPE A & B)
INFLAPCR: NEGATIVE
Influenza B By PCR: NEGATIVE

## 2018-02-21 MED ORDER — VANCOMYCIN HCL IN DEXTROSE 1-5 GM/200ML-% IV SOLN
1000.0000 mg | Freq: Once | INTRAVENOUS | Status: AC
  Administered 2018-02-21: 1000 mg via INTRAVENOUS
  Filled 2018-02-21: qty 200

## 2018-02-21 MED ORDER — LEVOTHYROXINE SODIUM 88 MCG PO TABS
88.0000 ug | ORAL_TABLET | Freq: Every day | ORAL | Status: DC
  Filled 2018-02-21: qty 1

## 2018-02-21 MED ORDER — LEVOTHYROXINE SODIUM 88 MCG PO TABS
88.0000 ug | ORAL_TABLET | ORAL | Status: DC
  Administered 2018-02-21: 88 ug via ORAL
  Filled 2018-02-21: qty 1

## 2018-02-21 MED ORDER — BUTALBITAL-APAP-CAFFEINE 50-325-40 MG PO TABS: 1.0000 | ORAL_TABLET | Freq: Four times a day (QID) | ORAL | Status: DC | PRN

## 2018-02-21 MED ORDER — ASPIRIN EC 81 MG PO TBEC
81.0000 mg | DELAYED_RELEASE_TABLET | Freq: Every day | ORAL | Status: DC
  Administered 2018-02-21 – 2018-02-22 (×2): 81 mg via ORAL
  Filled 2018-02-21: qty 1

## 2018-02-21 MED ORDER — VANCOMYCIN HCL 500 MG IV SOLR
500.0000 mg | INTRAVENOUS | Status: DC
  Filled 2018-02-21: qty 500

## 2018-02-21 MED ORDER — ASPIRIN EC 81 MG PO TBEC
DELAYED_RELEASE_TABLET | ORAL | Status: AC
  Administered 2018-02-21: 81 mg via ORAL
  Filled 2018-02-21: qty 1

## 2018-02-21 MED ORDER — LORAZEPAM 2 MG/ML IJ SOLN
1.0000 mg | INTRAMUSCULAR | Status: DC | PRN
  Administered 2018-02-21: 1 mg via INTRAVENOUS
  Filled 2018-02-21: qty 1

## 2018-02-21 MED ORDER — HEPARIN SODIUM (PORCINE) 5000 UNIT/ML IJ SOLN
5000.0000 [IU] | Freq: Three times a day (TID) | INTRAMUSCULAR | Status: DC
  Administered 2018-02-21 – 2018-02-22 (×3): 5000 [IU] via SUBCUTANEOUS
  Filled 2018-02-21 (×3): qty 1

## 2018-02-21 MED ORDER — POTASSIUM CHLORIDE CRYS ER 20 MEQ PO TBCR
40.0000 meq | EXTENDED_RELEASE_TABLET | Freq: Once | ORAL | Status: AC
  Administered 2018-02-21: 40 meq via ORAL
  Filled 2018-02-21: qty 2

## 2018-02-21 MED ORDER — POTASSIUM CHLORIDE IN NACL 20-0.9 MEQ/L-% IV SOLN
INTRAVENOUS | Status: DC
  Administered 2018-02-21 – 2018-02-22 (×2): via INTRAVENOUS
  Filled 2018-02-21: qty 1000

## 2018-02-21 MED ORDER — ESCITALOPRAM OXALATE 10 MG PO TABS
10.0000 mg | ORAL_TABLET | Freq: Every evening | ORAL | Status: DC
  Filled 2018-02-21: qty 1

## 2018-02-21 MED ORDER — DONEPEZIL HCL 5 MG PO TABS
10.0000 mg | ORAL_TABLET | Freq: Every day | ORAL | Status: DC
  Filled 2018-02-21: qty 2
  Filled 2018-02-21: qty 1

## 2018-02-21 MED ORDER — SODIUM CHLORIDE 0.9 % IV SOLN
2.0000 g | Freq: Once | INTRAVENOUS | Status: AC
  Administered 2018-02-21: 2 g via INTRAVENOUS
  Filled 2018-02-21: qty 2

## 2018-02-21 MED ORDER — ONDANSETRON HCL 4 MG/2ML IJ SOLN: 4.0000 mg | Freq: Four times a day (QID) | INTRAMUSCULAR | Status: DC | PRN

## 2018-02-21 MED ORDER — MEMANTINE HCL 10 MG PO TABS
10.0000 mg | ORAL_TABLET | Freq: Two times a day (BID) | ORAL | Status: DC
  Administered 2018-02-21 – 2018-02-22 (×2): 10 mg via ORAL
  Filled 2018-02-21 (×5): qty 1

## 2018-02-21 MED ORDER — SODIUM CHLORIDE 0.9 % IV SOLN
1.0000 g | Freq: Three times a day (TID) | INTRAVENOUS | Status: DC
  Filled 2018-02-21 (×3): qty 1

## 2018-02-21 MED ORDER — ONDANSETRON HCL 4 MG PO TABS: 4.0000 mg | ORAL_TABLET | Freq: Four times a day (QID) | ORAL | Status: DC | PRN

## 2018-02-21 MED ORDER — METRONIDAZOLE IN NACL 5-0.79 MG/ML-% IV SOLN
500.0000 mg | Freq: Three times a day (TID) | INTRAVENOUS | Status: DC
  Administered 2018-02-21 (×2): 500 mg via INTRAVENOUS
  Filled 2018-02-21 (×2): qty 100

## 2018-02-21 MED ORDER — ACETAMINOPHEN 650 MG RE SUPP: 650.0000 mg | Freq: Four times a day (QID) | RECTAL | Status: DC | PRN

## 2018-02-21 MED ORDER — ACETAMINOPHEN 325 MG PO TABS: 650.0000 mg | ORAL_TABLET | Freq: Four times a day (QID) | ORAL | Status: DC | PRN

## 2018-02-21 MED ORDER — IOHEXOL 300 MG/ML  SOLN
75.0000 mL | Freq: Once | INTRAMUSCULAR | Status: AC | PRN
  Administered 2018-02-21: 75 mL via INTRAVENOUS

## 2018-02-21 NOTE — Progress Notes (Signed)
Pharmacy Antibiotic Note  Melanie Bradley is a 68 y.o. female admitted on 02/20/2018 with infection- unknown source.  Pharmacy has been consulted for Vancomycin and Aztreonam dosing.  Patient has history of hives to penicillins with no listed use of cephalosporins.  Plan: Vancomycin 1000 mg IV x 1 dose. Vancomycin 500 mg IV every 24 hours.  Goal trough 15-20 mcg/mL.  Aztreonam 1000 mg IV every 8 hours. Monitor labs, c/s, and vanco trough as indicated.  Height: 5\' 2"  (157.5 cm) Weight: 104 lb (47.2 kg) IBW/kg (Calculated) : 50.1  Temp (24hrs), Avg:98 F (36.7 C), Min:97.8 F (36.6 C), Max:98.2 F (36.8 C)  Recent Labs  Lab 02/20/18 2357 02/21/18 0028 02/21/18 0308  WBC 32.9*  --   --   CREATININE 1.27*  --   --   LATICACIDVEN  --  3.30* 1.84    Estimated Creatinine Clearance: 32.9 mL/min (A) (by C-G formula based on SCr of 1.27 mg/dL (H)).    Allergies  Allergen Reactions  . Penicillins Hives and Swelling    Has patient had a PCN reaction causing IMMEDIATE RASH, FACIAL/TONGUE/THROAT SWELLING, SOB, OR LIGHTHEADEDNESS WITH HYPOTENSION:  #  #  #  YES  #  #  #  Has patient had a PCN reaction causing severe rash involving mucus membranes or skin necrosis: No Has patient had a PCN reaction that required hospitalization No Has patient had a PCN reaction occurring within the last 10 years: No If all of the above answers are "NO", then may proceed with Cephalosporin use.   . Benadryl [Diphenhydramine Hcl] Hives  . Msm [Methylsulfonylmethane] Hives  . Nubain [Nalbuphine Hcl] Hives  . Sulfa Antibiotics Hives  . Codeine     UNSPECIFIED REACTION [IF AT ALL] REFUSES TO TAKE    Antimicrobials this admission: Aztreonam 1/9 >>  Vanco 1/9 >>   Dose adjustments this admission: Aztreonam/Vanco  Microbiology results: 1/9 BCx: pending 1/9 UCx: pending    Thank you for allowing pharmacy to be a part of this patient's care.  Ramond Craver 02/21/2018 11:17 AM

## 2018-02-21 NOTE — ED Notes (Signed)
Pt crying wanting to go home, husband and sister at the bedside.  Husband will stay with patient tonight.

## 2018-02-21 NOTE — Progress Notes (Deleted)
History and Physical    Melanie Bradley GGY:[id number] DOB: 04-15-1950 DOA: 02/20/2018  PCP: Sharilyn Sites, MD  Patient coming from: Home  I have personally briefly reviewed patient's old medical records in [place]  Chief Complaint: Vomiting, diarrhea, poor p.o. intake  HPI: Melanie Bradley is a 68 y.o. female with medical history significant of dementia, hypertension, hypothyroidism, is brought to the hospital by her husband with vomiting, diarrhea.  He reports that for the past few weeks, she has had very poor p.o. intake and has been losing weight.  She was recently diagnosed with upper respiratory tract infection and completed a course of antibiotics (Levaquin).  Her respiratory symptoms have resolved, but her appetite is not improved.  She is been increasingly weak.  Overnight, she developed several episodes of vomiting and diarrhea.  She was brought to the emergency room where she was noted to be hypotensive and mildly tachycardic.  WBC count elevated greater than 30,000.  Remainder labs are relatively unrevealing.  CT scan of the abdomen pelvis did not show any acute findings.  Chest x-ray negative.  Lactic acid also elevated on admission.  She was given some IV fluids with improvement of blood pressure.  Since there was initially concern for underlying sepsis, she was treated with broad-spectrum antibiotics and referred for admission.  Review of Systems: As per HPI otherwise 10 point review of systems negative.    Past Medical History:  Diagnosis Date  . Atrial fibrillation ([place])   . Bloating   . Complication of anesthesia     " hard to wake up sometimes "  . Dementia ([place])   . Diverticula of colon   . Epigastric burning sensation   . GERD (gastroesophageal reflux disease)   . Headache disorder 06/03/2014  . Headache(784.0)   . Helicobacter pylori gastritis 2004  . Hemorrhoids 06/26/03   Dr. Laural Golden [facility]  . Hiatal hernia 06/26/03   Dr. Laural Golden [facility]  . Hypertension   .  Hypothyroidism   . 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 Memory difficulty 06/03/2014  . Shoulder fracture, left   . Spastic colon   . Wrist fracture     Past Surgical History:  Procedure Laterality Date  . APPENDECTOMY    . CHOLECYSTECTOMY    . COLONOSCOPY  06/26/03   Dr. Kem Boroughs diverticula at the sigmoid colon with one of the transverse colon, normal terminal ileoscopy, small external hemorrhoids the  . COLONOSCOPY N/A 01/20/2016   Procedure: COLONOSCOPY;  Surgeon: Daneil Dolin, MD;  [facility];  Service: Endoscopy;  Laterality: N/A;  2:30 pm  . ESOPHAGOGASTRODUODENOSCOPY  03/15/2011   Dr. Trevor Iha hiatal hernia, abnormal gastric mucosa of unclear significance. Gastric biopsies negative, no H. pylori.Procedure: ESOPHAGOGASTRODUODENOSCOPY (EGD);  Surgeon: Daneil Dolin, MD;  [facility];  Service: Endoscopy;  Laterality: N/A;  7:30  . ORIF HUMERUS FRACTURE Left 05/29/2016   Procedure: LEFT OPEN REDUCTION INTERNAL FIXATION (ORIF) PROXIMAL HUMERUS FRACTURE;  Surgeon: Meredith Pel, MD;  Location: [facility];  Service: Orthopedics;  Laterality: Left;  . ORIF WRIST FRACTURE Left 05/29/2016   Procedure: OPEN REDUCTION INTERNAL FIXATION (ORIF) LEFT WRIST FRACTURE;  Surgeon: Meredith Pel, MD;  Location: [facility];  Service: Orthopedics;  Laterality: Left;  . TUBAL LIGATION    . UPPER GASTROINTESTINAL ENDOSCOPY  06/26/03   Dr. Wynelle Bourgeois sliding hiatal hernia with 8 mm tongue of gastric type mucosa at distal esophagus bile in the  stomach.    Social History:  reports that she has been smoking cigarettes. She started smoking about 51 years ago. She has a 45.00 pack-year smoking history. She has never used smokeless tobacco. She reports that she does not drink alcohol or use drugs.  Allergies  Allergen Reactions  . Penicillins Hives and Swelling    Has patient had a PCN reaction causing IMMEDIATE RASH, FACIAL/TONGUE/THROAT SWELLING,  SOB, OR LIGHTHEADEDNESS WITH HYPOTENSION:  #  #  #  YES  #  #  #  Has patient had a PCN reaction causing severe rash involving mucus membranes or skin necrosis: No Has patient had a PCN reaction that required hospitalization No Has patient had a PCN reaction occurring within the last 10 years: No If all of the above answers are "NO", then may proceed with Cephalosporin use.   . Benadryl [Diphenhydramine Hcl] Hives  . Msm [Methylsulfonylmethane] Hives  . Nubain [Nalbuphine Hcl] Hives  . Sulfa Antibiotics Hives  . Codeine     UNSPECIFIED REACTION [IF AT ALL] REFUSES TO TAKE    Family History  Problem Relation Age of Onset  . Hypertension Mother   . Angina Mother   . COPD Father   . Heart failure Father   . Cancer Sister   . Dementia Sister   . Cardiomyopathy Brother   . Pneumonia Other   . Migraines Sister   . Colon cancer Neg Hx     Prior to Admission medications   Medication Sig Start Date End Date Taking? Authorizing Provider  amLODipine (NORVASC) 10 MG tablet Take 10 mg daily by mouth.    [provider]  aspirin 81 MG tablet Take 81 mg by mouth daily.    [provider]  butalbital-aspirin-caffeine Acquanetta Chain) [phone] MG capsule  06/04/17   [provider]  calcium carbonate (CALTRATE 600) 1500 (600 Ca) MG TABS tablet Take 600 mg of elemental calcium by mouth daily. Plus D3    [provider]  Cyanocobalamin (B-12 PO) Take 6,000 mcg by mouth daily.    [provider]  donepezil (ARICEPT) 10 MG tablet TAKE 1 TABLET BY MOUTH AT BEDTIME. 02/20/18   Kathrynn Ducking, MD  escitalopram (LEXAPRO) 10 MG tablet Take 10 mg by mouth every evening.  10/16/14   [provider]  losartan (COZAAR) 100 MG tablet Take 100 mg by mouth daily.    [provider]  memantine (NAMENDA) 10 MG tablet TAKE 1 TABLET BY MOUTH TWICE DAILY. 02/20/18   Kathrynn Ducking, MD  OVER THE COUNTER MEDICATION Take 1 tablet by mouth daily. cognisense otc  supplement    [provider]  SYNTHROID 88 MCG tablet 88 mcg daily. 12/15/17   [provider]    Physical Exam: Vitals:   02/21/18 0630 02/21/18 0700 02/21/18 0730 02/21/18 0905  BP: 102/85 117/80 101/68   Pulse:      Resp: 18 19 19    Temp:      TempSrc:      SpO2:      Weight:    47.2 kg  Height:    5\' 2"  (1.575 m)    Constitutional: NAD, calm, comfortable Eyes: PERRL, lids and conjunctivae normal ENMT: Mucous membranes are moist. Posterior pharynx clear of any exudate or lesions.Normal dentition.  Neck: normal, supple, no masses, no thyromegaly Respiratory: clear to auscultation bilaterally, no wheezing, no crackles. Normal respiratory effort. No accessory muscle use.  Cardiovascular: Regular rate and rhythm, no murmurs / rubs / gallops.  No extremity edema. 2+ pedal pulses. No carotid bruits.  Abdomen: no tenderness, no masses palpated. No hepatosplenomegaly. Bowel sounds positive.  Musculoskeletal: no clubbing / cyanosis. No joint deformity upper and lower extremities. Good ROM, no contractures. Normal muscle tone.  Skin: no rashes, lesions, ulcers. No induration Neurologic: CN 2-12 grossly intact. Sensation intact, DTR normal. Strength 5/5 in all 4.  Psychiatric: Normal judgment and insight. Alert and oriented x 3. Normal mood.    Labs on Admission: I have personally reviewed following labs and imaging studies  CBC: Recent Labs  Lab 02/20/18 2357  WBC 32.9*  NEUTROABS 27.4*  HGB 13.5  HCT 42.2  MCV 96.1  PLT 258*   Basic Metabolic Panel: Recent Labs  Lab 02/20/18 2357  NA 132*  K 2.9*  CL 94*  CO2 25  GLUCOSE 117*  BUN 15  CREATININE 1.27*  CALCIUM 8.8*   GFR: Estimated Creatinine Clearance: 32.9 mL/min (A) (by C-G formula based on SCr of 1.27 mg/dL (H)). Liver Function Tests: Recent Labs  Lab 02/20/18 2357  AST 21  ALT 13  ALKPHOS 85  BILITOT 0.5  PROT 7.4  ALBUMIN 3.3*   Recent Labs  Lab 02/20/18 2357  LIPASE 31   No  results for input(s): AMMONIA in the last 168 hours. Coagulation Profile: No results for input(s): INR, PROTIME in the last 168 hours. Cardiac Enzymes: No results for input(s): CKTOTAL, CKMB, CKMBINDEX, TROPONINI in the last 168 hours. BNP (last 3 results) No results for input(s): PROBNP in the last 8760 hours. HbA1C: No results for input(s): HGBA1C in the last 72 hours. CBG: No results for input(s): GLUCAP in the last 168 hours. Lipid Profile: No results for input(s): CHOL, HDL, LDLCALC, TRIG, CHOLHDL, LDLDIRECT in the last 72 hours. Thyroid Function Tests: No results for input(s): TSH, T4TOTAL, FREET4, T3FREE, THYROIDAB in the last 72 hours. Anemia Panel: No results for input(s): VITAMINB12, FOLATE, FERRITIN, TIBC, IRON, RETICCTPCT in the last 72 hours. Urine analysis:    Component Value Date/Time   COLORURINE AMBER (A) 02/21/2018 0215   APPEARANCEUR CLEAR 02/21/2018 0215   LABSPEC 1.006 02/21/2018 0215   PHURINE 5.0 02/21/2018 0215   GLUCOSEU NEGATIVE 02/21/2018 0215   HGBUR SMALL (A) 02/21/2018 0215   BILIRUBINUR NEGATIVE 02/21/2018 0215   KETONESUR NEGATIVE 02/21/2018 0215   PROTEINUR NEGATIVE 02/21/2018 0215   NITRITE NEGATIVE 02/21/2018 0215   LEUKOCYTESUR NEGATIVE 02/21/2018 0215    Radiological Exams on Admission: Dg Chest 2 View  Result Date: 02/21/2018 CLINICAL DATA:  Initial evaluation for acute cough, weakness. EXAM: CHEST - 2 VIEW COMPARISON:  Prior radiograph from 05/08/2013. FINDINGS: Cardiac and mediastinal silhouettes are stable in size and contour, and remain within normal limits. Lungs well inflated. No focal infiltrates, edema, or effusion. No pneumothorax. Nipple shadows overlie the bilateral lung bases. Compression deformity of the L1 vertebral body, similar to previous exams. Sequelae of prior ORIF at the proximal left humerus. No acute osseous abnormality. IMPRESSION: No radiographic evidence for active cardiopulmonary disease. Electronically Signed   By:  Jeannine Boga M.D.   On: 02/21/2018 01:15   Ct Abdomen Pelvis W Contrast  Result Date: 02/21/2018 CLINICAL DATA:  Decreased appetite EXAM: CT ABDOMEN AND PELVIS WITH CONTRAST TECHNIQUE: Multidetector CT imaging of the abdomen and pelvis was performed using the standard protocol following bolus administration of intravenous contrast. CONTRAST:  57mL OMNIPAQUE IOHEXOL 300 MG/ML  SOLN COMPARISON:  07/21/2009 CT, lumbar spine 01/28/2016 FINDINGS: Lower chest: Normal heart size. Atelectasis at the lung bases.  No effusion or pneumothorax. Hepatobiliary: No focal liver abnormality is seen. Status post cholecystectomy. No biliary dilatation. Pancreas: Atrophy of the pancreatic gland. No pancreatic ductal dilatation or surrounding inflammatory changes. Spleen: Normal Adrenals/Urinary Tract: Interval increase in size of left adrenal mass now measuring 3.4 x 3.6 cm, previously 2.5 cm. 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 Stomach/Bowel: Small hiatal hernia. Physiologic distention of the stomach normal small bowel rotation. No bowel obstruction or inflammation. The distal and terminal ileum are normal. The appendix is surgically absent. Antegrade flow of contrast is demonstrated into the colon. There is extensive distal descending in sigmoid colonic diverticulosis without acute diverticulitis. Vascular/Lymphatic: Aorto iliac atherosclerosis. Patent branch vessels. No aneurysm or dissection. Reproductive: Uterus and bilateral adnexa are unremarkable. Other: No abdominal wall hernia or abnormality. No abdominopelvic ascites. Musculoskeletal: Chronic marked biconcave osteoporotic compression fracture T12 with 4 mm of retropulsion of the superior endplate. Chronic mild superior endplate compression of L3 25%. Degenerative disc disease with vacuum phenomenon noted at L4-5. No pars defects or listhesis. IMPRESSION: 1. Interval increase in size of left adrenal mass now measuring 3.4 x  3.6 cm, previously 2.5 cm. 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 2. Status post cholecystectomy. 3. Colonic diverticulosis without acute diverticulitis. 4. Chronic marked biconcave osteoporotic compression fracture of T12 with 4 mm of retropulsion of the superior endplate. Chronic mild superior endplate compression of L3. Electronically Signed   By: Ashley Royalty M.D.   On: 02/21/2018 03:28    EKG: Independently reviewed.  Sinus tachycardia with some PVCs  Assessment/Plan Active Problems:   Hypertension   Sepsis ([place])   Leukocytosis   Vomiting and diarrhea   Dementia without behavioral disturbance ([facility])   Hypothyroidism   Diarrhea     1. Hypotension.  Suspect this is related to volume depletion.  Initially, there were concerns for sepsis and she was started on broad-spectrum antibiotics.  Blood cultures have been sent.  At this time, patient does not appear septic and actually appears to be looking quite well.  She wants to go home, but I have recommended that she stay in the hospital until it can be better determined as to the cause of her symptoms.  Will hold off on further antibiotics now and follow clinically. 2. Vomiting and diarrhea.  With recent antibiotic use, concern for C. difficile.  Check stool studies.  Other etiologies include viral illness.  We will also check influenza. 3. Leukocytosis.  Suspect this is due to dehydration/stress response.  She does not appear to be septic.  Continue to monitor. 4. Elevated lactic acid.  Related to dehydration.  Improved with IV fluids. 5. Hypertension.  Chronically on losartan and amlodipine.  These will be held since her blood pressure was low on admission. 6. Hypothyroidism.  Continue on Synthroid 7. Dementia.  Continue on Namenda and Aricept.  DVT prophylaxis: heparin Code Status: full code  Family Communication: discussed with husband at the bedside  Disposition Plan: discharge home once labs and  symptoms are improved  Consults called:   Admission status: inpatient, medsurg   Kathie Dike MD [facility] Pager [phone]  If 7PM-7AM, please contact night-coverage [email or web address] Password [facility]  02/21/2018, 9:26 AM

## 2018-02-21 NOTE — ED Notes (Signed)
Husband at the bedside, Pt is sitting on the side of the bed, crying, begging to go home.

## 2018-02-21 NOTE — ED Notes (Addendum)
Patient ambulated to restroom with standby assistance. Patient states that she really wants to go home at this time. Patient is pleasant and cooperative with staff. Family at bedside. Husband giving patient Mt Dew to drink at this time.

## 2018-02-21 NOTE — ED Notes (Signed)
Pt in bed, husband working on calming pt down, TV on, warm blanket

## 2018-02-21 NOTE — H&P (Signed)
History and Physical    Melanie Bradley JEH:[id number] DOB: 1950-04-28 DOA: 02/20/2018  PCP: Sharilyn Sites, MD  Patient coming from: Home  I have personally briefly reviewed patient's old medical records in [place]  Chief Complaint: Vomiting, diarrhea, poor p.o. intake  HPI: Melanie Bradley is a 68 y.o. female with medical history significant of dementia, hypertension, hypothyroidism, is brought to the hospital by her husband with vomiting, diarrhea.  He reports that for the past few weeks, she has had very poor p.o. intake and has been losing weight.  She was recently diagnosed with upper respiratory tract infection and completed a course of antibiotics (Levaquin).  Her respiratory symptoms have resolved, but her appetite is not improved.  She is been increasingly weak.  Overnight, she developed several episodes of vomiting and diarrhea.  She was brought to the emergency room where she was noted to be hypotensive and mildly tachycardic.  WBC count elevated greater than 30,000.  Remainder labs are relatively unrevealing.  CT scan of the abdomen pelvis did not show any acute findings.  Chest x-ray negative.  Lactic acid also elevated on admission.  She was given some IV fluids with improvement of blood pressure.  Since there was initially concern for underlying sepsis, she was treated with broad-spectrum antibiotics and referred for admission.  Review of Systems: As per HPI otherwise 10 point review of systems negative.    Past Medical History:  Diagnosis Date  . Atrial fibrillation ([place])   . Bloating   . Complication of anesthesia     " hard to wake up sometimes "  . Dementia ([place])   . Diverticula of colon   . Epigastric burning sensation   . GERD (gastroesophageal reflux disease)   . Headache disorder 06/03/2014  . Headache(784.0)   . Helicobacter pylori gastritis 2004  . Hemorrhoids 06/26/03   Dr. Laural Golden [facility]  . Hiatal hernia 06/26/03   Dr. Laural Golden [facility]  . Hypertension   .  Hypothyroidism   . 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 Memory difficulty 06/03/2014  . Shoulder fracture, left   . Spastic colon   . Wrist fracture     Past Surgical History:  Procedure Laterality Date  . APPENDECTOMY    . CHOLECYSTECTOMY    . COLONOSCOPY  06/26/03   Dr. Kem Boroughs diverticula at the sigmoid colon with one of the transverse colon, normal terminal ileoscopy, small external hemorrhoids the  . COLONOSCOPY N/A 01/20/2016   Procedure: COLONOSCOPY;  Surgeon: Daneil Dolin, MD;  [facility];  Service: Endoscopy;  Laterality: N/A;  2:30 pm  . ESOPHAGOGASTRODUODENOSCOPY  03/15/2011   Dr. Trevor Iha hiatal hernia, abnormal gastric mucosa of unclear significance. Gastric biopsies negative, no H. pylori.Procedure: ESOPHAGOGASTRODUODENOSCOPY (EGD);  Surgeon: Daneil Dolin, MD;  [facility];  Service: Endoscopy;  Laterality: N/A;  7:30  . ORIF HUMERUS FRACTURE Left 05/29/2016   Procedure: LEFT OPEN REDUCTION INTERNAL FIXATION (ORIF) PROXIMAL HUMERUS FRACTURE;  Surgeon: Meredith Pel, MD;  Location: [place];  Service: Orthopedics;  Laterality: Left;  . ORIF WRIST FRACTURE Left 05/29/2016   Procedure: OPEN REDUCTION INTERNAL FIXATION (ORIF) LEFT WRIST FRACTURE;  Surgeon: Meredith Pel, MD;  Location: [place];  Service: Orthopedics;  Laterality: Left;  . TUBAL LIGATION    . UPPER GASTROINTESTINAL ENDOSCOPY  06/26/03   Dr. Wynelle Bourgeois sliding hiatal hernia with 8 mm tongue of gastric type mucosa at distal esophagus bile in the  stomach.    Social History:  reports that she has been smoking cigarettes. She started smoking about 51 years ago. She has a 45.00 pack-year smoking history. She has never used smokeless tobacco. She reports that she does not drink alcohol or use drugs.  Allergies  Allergen Reactions  . Penicillins Hives and Swelling    Has patient had a PCN reaction causing IMMEDIATE RASH, FACIAL/TONGUE/THROAT SWELLING,  SOB, OR LIGHTHEADEDNESS WITH HYPOTENSION:  #  #  #  YES  #  #  #  Has patient had a PCN reaction causing severe rash involving mucus membranes or skin necrosis: No Has patient had a PCN reaction that required hospitalization No Has patient had a PCN reaction occurring within the last 10 years: No If all of the above answers are "NO", then may proceed with Cephalosporin use.   . Benadryl [Diphenhydramine Hcl] Hives  . Msm [Methylsulfonylmethane] Hives  . Nubain [Nalbuphine Hcl] Hives  . Sulfa Antibiotics Hives  . Codeine     UNSPECIFIED REACTION [IF AT ALL] REFUSES TO TAKE    Family History  Problem Relation Age of Onset  . Hypertension Mother   . Angina Mother   . COPD Father   . Heart failure Father   . Cancer Sister   . Dementia Sister   . Cardiomyopathy Brother   . Pneumonia Other   . Migraines Sister   . Colon cancer Neg Hx     Prior to Admission medications   Medication Sig Start Date End Date Taking? Authorizing Provider  amLODipine (NORVASC) 10 MG tablet Take 10 mg daily by mouth.    [provider]  aspirin 81 MG tablet Take 81 mg by mouth daily.    [provider]  butalbital-aspirin-caffeine Acquanetta Chain) [phone] MG capsule  06/04/17   [provider]  calcium carbonate (CALTRATE 600) 1500 (600 Ca) MG TABS tablet Take 600 mg of elemental calcium by mouth daily. Plus D3    [provider]  Cyanocobalamin (B-12 PO) Take 6,000 mcg by mouth daily.    [provider]  donepezil (ARICEPT) 10 MG tablet TAKE 1 TABLET BY MOUTH AT BEDTIME. 02/20/18   Kathrynn Ducking, MD  escitalopram (LEXAPRO) 10 MG tablet Take 10 mg by mouth every evening.  10/16/14   [provider]  losartan (COZAAR) 100 MG tablet Take 100 mg by mouth daily.    [provider]  memantine (NAMENDA) 10 MG tablet TAKE 1 TABLET BY MOUTH TWICE DAILY. 02/20/18   Kathrynn Ducking, MD  OVER THE COUNTER MEDICATION Take 1 tablet by mouth daily. cognisense otc  supplement    [provider]  SYNTHROID 88 MCG tablet 88 mcg daily. 12/15/17   [provider]    Physical Exam: Vitals:   02/21/18 1500 02/21/18 1530 02/21/18 1553 02/21/18 1555  BP: 121/83 (!) 128/92  (!) 126/91  Pulse: 75   75  Resp: 15 15 16    Temp:    98 F (36.7 C)  TempSrc:   Oral Oral  SpO2: 90%   100%  Weight:   49.6 kg   Height:   5\' 2"  (1.575 m)     Constitutional: NAD, calm, comfortable Eyes: PERRL, lids and conjunctivae normal ENMT: Mucous membranes are moist. Posterior pharynx clear of any exudate or lesions.Normal dentition.  Neck: normal, supple, no masses, no thyromegaly Respiratory: clear to auscultation bilaterally, no wheezing, no crackles. Normal respiratory effort. No accessory muscle use.  Cardiovascular: Regular rate and rhythm, no  murmurs / rubs / gallops. No extremity edema. 2+ pedal pulses. No carotid bruits.  Abdomen: no tenderness, no masses palpated. No hepatosplenomegaly. Bowel sounds positive.  Musculoskeletal: no clubbing / cyanosis. No joint deformity upper and lower extremities. Good ROM, no contractures. Normal muscle tone.  Skin: no rashes, lesions, ulcers. No induration Neurologic: CN 2-12 grossly intact. Sensation intact, DTR normal. Strength 5/5 in all 4.  Psychiatric: Normal judgment and insight. Alert and oriented x 3. Normal mood.    Labs on Admission: I have personally reviewed following labs and imaging studies  CBC: Recent Labs  Lab 02/20/18 2357  WBC 32.9*  NEUTROABS 27.4*  HGB 13.5  HCT 42.2  MCV 96.1  PLT 329*   Basic Metabolic Panel: Recent Labs  Lab 02/20/18 2357  NA 132*  K 2.9*  CL 94*  CO2 25  GLUCOSE 117*  BUN 15  CREATININE 1.27*  CALCIUM 8.8*   GFR: Estimated Creatinine Clearance: 34.6 mL/min (A) (by C-G formula based on SCr of 1.27 mg/dL (H)). Liver Function Tests: Recent Labs  Lab 02/20/18 2357  AST 21  ALT 13  ALKPHOS 85  BILITOT 0.5  PROT 7.4  ALBUMIN 3.3*   Recent Labs   Lab 02/20/18 2357  LIPASE 31   No results for input(s): AMMONIA in the last 168 hours. Coagulation Profile: No results for input(s): INR, PROTIME in the last 168 hours. Cardiac Enzymes: No results for input(s): CKTOTAL, CKMB, CKMBINDEX, TROPONINI in the last 168 hours. BNP (last 3 results) No results for input(s): PROBNP in the last 8760 hours. HbA1C: No results for input(s): HGBA1C in the last 72 hours. CBG: No results for input(s): GLUCAP in the last 168 hours. Lipid Profile: No results for input(s): CHOL, HDL, LDLCALC, TRIG, CHOLHDL, LDLDIRECT in the last 72 hours. Thyroid Function Tests: No results for input(s): TSH, T4TOTAL, FREET4, T3FREE, THYROIDAB in the last 72 hours. Anemia Panel: No results for input(s): VITAMINB12, FOLATE, FERRITIN, TIBC, IRON, RETICCTPCT in the last 72 hours. Urine analysis:    Component Value Date/Time   COLORURINE AMBER (A) 02/21/2018 0215   APPEARANCEUR CLEAR 02/21/2018 0215   LABSPEC 1.006 02/21/2018 0215   PHURINE 5.0 02/21/2018 0215   GLUCOSEU NEGATIVE 02/21/2018 0215   HGBUR SMALL (A) 02/21/2018 0215   BILIRUBINUR NEGATIVE 02/21/2018 0215   KETONESUR NEGATIVE 02/21/2018 0215   PROTEINUR NEGATIVE 02/21/2018 0215   NITRITE NEGATIVE 02/21/2018 0215   LEUKOCYTESUR NEGATIVE 02/21/2018 0215    Radiological Exams on Admission: Dg Chest 2 View  Result Date: 02/21/2018 CLINICAL DATA:  Initial evaluation for acute cough, weakness. EXAM: CHEST - 2 VIEW COMPARISON:  Prior radiograph from 05/08/2013. FINDINGS: Cardiac and mediastinal silhouettes are stable in size and contour, and remain within normal limits. Lungs well inflated. No focal infiltrates, edema, or effusion. No pneumothorax. Nipple shadows overlie the bilateral lung bases. Compression deformity of the L1 vertebral body, similar to previous exams. Sequelae of prior ORIF at the proximal left humerus. No acute osseous abnormality. IMPRESSION: No radiographic evidence for active  cardiopulmonary disease. Electronically Signed   By: Jeannine Boga M.D.   On: 02/21/2018 01:15   Ct Abdomen Pelvis W Contrast  Result Date: 02/21/2018 CLINICAL DATA:  Decreased appetite EXAM: CT ABDOMEN AND PELVIS WITH CONTRAST TECHNIQUE: Multidetector CT imaging of the abdomen and pelvis was performed using the standard protocol following bolus administration of intravenous contrast. CONTRAST:  67mL OMNIPAQUE IOHEXOL 300 MG/ML  SOLN COMPARISON:  07/21/2009 CT, lumbar spine 01/28/2016 FINDINGS: Lower chest: Normal heart size.  Atelectasis at the lung bases. No effusion or pneumothorax. Hepatobiliary: No focal liver abnormality is seen. Status post cholecystectomy. No biliary dilatation. Pancreas: Atrophy of the pancreatic gland. No pancreatic ductal dilatation or surrounding inflammatory changes. Spleen: Normal Adrenals/Urinary Tract: Interval increase in size of left adrenal mass now measuring 3.4 x 3.6 cm, previously 2.5 cm. 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 Stomach/Bowel: Small hiatal hernia. Physiologic distention of the stomach normal small bowel rotation. No bowel obstruction or inflammation. The distal and terminal ileum are normal. The appendix is surgically absent. Antegrade flow of contrast is demonstrated into the colon. There is extensive distal descending in sigmoid colonic diverticulosis without acute diverticulitis. Vascular/Lymphatic: Aorto iliac atherosclerosis. Patent branch vessels. No aneurysm or dissection. Reproductive: Uterus and bilateral adnexa are unremarkable. Other: No abdominal wall hernia or abnormality. No abdominopelvic ascites. Musculoskeletal: Chronic marked biconcave osteoporotic compression fracture T12 with 4 mm of retropulsion of the superior endplate. Chronic mild superior endplate compression of L3 25%. Degenerative disc disease with vacuum phenomenon noted at L4-5. No pars defects or listhesis. IMPRESSION: 1. Interval  increase in size of left adrenal mass now measuring 3.4 x 3.6 cm, previously 2.5 cm. 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 2. Status post cholecystectomy. 3. Colonic diverticulosis without acute diverticulitis. 4. Chronic marked biconcave osteoporotic compression fracture of T12 with 4 mm of retropulsion of the superior endplate. Chronic mild superior endplate compression of L3. Electronically Signed   By: Ashley Royalty M.D.   On: 02/21/2018 03:28    EKG: Independently reviewed.  Sinus tachycardia with some PVCs  Assessment/Plan Active Problems:   Hypertension   Sepsis ([place])   Leukocytosis   Vomiting and diarrhea   Dementia without behavioral disturbance ([facility])   Hypothyroidism   Diarrhea     1. Hypotension.  Suspect this is related to volume depletion.  Initially, there were concerns for sepsis and she was started on broad-spectrum antibiotics.  Blood cultures have been sent.  At this time, patient does not appear septic and actually appears to be looking quite well.  She wants to go home, but I have recommended that she stay in the hospital until it can be better determined as to the cause of her symptoms.  Will hold off on further antibiotics now and follow clinically. 2. Vomiting and diarrhea.  With recent antibiotic use, concern for C. difficile.  Check stool studies.  Other etiologies include viral illness.  We will also check influenza. 3. Leukocytosis.  Suspect this is due to dehydration/stress response.  She does not appear to be septic.  Continue to monitor. 4. Elevated lactic acid.  Related to dehydration.  Improved with IV fluids. 5. Hypertension.  Chronically on losartan and amlodipine.  These will be held since her blood pressure was low on admission. 6. Hypothyroidism.  Continue on Synthroid 7. Dementia.  Continue on Namenda and Aricept.  DVT prophylaxis: heparin Code Status: full code  Family Communication: discussed with husband at the  bedside  Disposition Plan: discharge home once labs and symptoms are improved  Consults called:   Admission status: inpatient, medsurg   Kathie Dike MD [facility] Pager [phone]  If 7PM-7AM, please contact night-coverage [email or web address] Password [facility]  02/21/2018, 7:38 PM

## 2018-02-21 NOTE — ED Notes (Signed)
Called Pharmacy, only monitor old iv site in right arm, remove no complaints per patient.

## 2018-02-21 NOTE — ED Notes (Signed)
Date and time results received: 02/21/18  00:32  Test: Lactic Critical Value: Lactic  Name of Provider Notified: Delo  Orders Received? Or Actions Taken?: Physician notified

## 2018-02-21 NOTE — ED Notes (Signed)
Pt is calm, talkative, repeats conversations often.

## 2018-02-21 NOTE — ED Notes (Signed)
Iv starting to burn and puffy, infiltrating, removed and started a new IV left forearm

## 2018-02-22 DIAGNOSIS — E039 Hypothyroidism, unspecified: Secondary | ICD-10-CM | POA: Diagnosis not present

## 2018-02-22 DIAGNOSIS — A0472 Enterocolitis due to Clostridium difficile, not specified as recurrent: Secondary | ICD-10-CM

## 2018-02-22 DIAGNOSIS — D72829 Elevated white blood cell count, unspecified: Secondary | ICD-10-CM | POA: Diagnosis not present

## 2018-02-22 DIAGNOSIS — I959 Hypotension, unspecified: Secondary | ICD-10-CM | POA: Diagnosis not present

## 2018-02-22 DIAGNOSIS — R69 Illness, unspecified: Secondary | ICD-10-CM | POA: Diagnosis not present

## 2018-02-22 DIAGNOSIS — K529 Noninfective gastroenteritis and colitis, unspecified: Secondary | ICD-10-CM | POA: Diagnosis not present

## 2018-02-22 DIAGNOSIS — F0390 Unspecified dementia without behavioral disturbance: Secondary | ICD-10-CM | POA: Diagnosis not present

## 2018-02-22 LAB — GASTROINTESTINAL PANEL BY PCR, STOOL (REPLACES STOOL CULTURE)

## 2018-02-22 LAB — CBC
HCT: 34.4 % — ABNORMAL LOW (ref 36.0–46.0)
Hemoglobin: 11 g/dL — ABNORMAL LOW (ref 12.0–15.0)
MCH: 31.2 pg (ref 26.0–34.0)
MCHC: 32 g/dL (ref 30.0–36.0)
MCV: 97.5 fL (ref 80.0–100.0)
Platelets: 397 10*3/uL (ref 150–400)
RBC: 3.53 MIL/uL — ABNORMAL LOW (ref 3.87–5.11)
RDW: 13.7 % (ref 11.5–15.5)
WBC: 19.2 10*3/uL — ABNORMAL HIGH (ref 4.0–10.5)
nRBC: 0 % (ref 0.0–0.2)

## 2018-02-22 LAB — HIV ANTIBODY (ROUTINE TESTING W REFLEX): HIV Screen 4th Generation wRfx: NONREACTIVE

## 2018-02-22 LAB — URINE CULTURE: Culture: NO GROWTH

## 2018-02-22 LAB — COMPREHENSIVE METABOLIC PANEL
ALT: 8 U/L (ref 0–44)
AST: 11 U/L — ABNORMAL LOW (ref 15–41)
Albumin: 2.7 g/dL — ABNORMAL LOW (ref 3.5–5.0)
Alkaline Phosphatase: 71 U/L (ref 38–126)
Anion gap: 5 (ref 5–15)
BILIRUBIN TOTAL: 0.4 mg/dL (ref 0.3–1.2)
BUN: 12 mg/dL (ref 8–23)
CO2: 26 mmol/L (ref 22–32)
Calcium: 8.1 mg/dL — ABNORMAL LOW (ref 8.9–10.3)
Chloride: 108 mmol/L (ref 98–111)
Creatinine, Ser: 0.84 mg/dL (ref 0.44–1.00)
GFR calc Af Amer: >60 mL/min (ref >60)
Glucose, Bld: 94 mg/dL (ref 70–99)
POTASSIUM: 3.5 mmol/L (ref 3.5–5.1)
Sodium: 139 mmol/L (ref 135–145)
TOTAL PROTEIN: 6 g/dL — AB (ref 6.5–8.1)

## 2018-02-22 LAB — C DIFFICILE QUICK SCREEN W PCR REFLEX
C Diff antigen: POSITIVE — AB
C Diff interpretation: DETECTED
C Diff toxin: POSITIVE — AB

## 2018-02-22 MED ORDER — VANCOMYCIN 50 MG/ML ORAL SOLUTION
125.0000 mg | Freq: Four times a day (QID) | ORAL | Status: DC
  Administered 2018-02-22 (×2): 125 mg via ORAL
  Filled 2018-02-22 (×14): qty 2.5

## 2018-02-22 MED ORDER — SODIUM CHLORIDE 0.45 % IV BOLUS
1000.0000 mL | INTRAVENOUS | Status: AC
  Administered 2018-02-22 (×2): 1000 mL via INTRAVENOUS

## 2018-02-22 MED ORDER — VANCOMYCIN 50 MG/ML ORAL SOLUTION: 125.0000 mg | Freq: Four times a day (QID) | ORAL | 0 refills | Status: DC

## 2018-02-22 MED ORDER — SACCHAROMYCES BOULARDII 250 MG PO CAPS: 250.0000 mg | ORAL_CAPSULE | Freq: Two times a day (BID) | ORAL | 0 refills | Status: DC

## 2018-02-22 MED ORDER — POTASSIUM CHLORIDE CRYS ER 20 MEQ PO TBCR
40.0000 meq | EXTENDED_RELEASE_TABLET | Freq: Once | ORAL | Status: AC
  Administered 2018-02-22: 40 meq via ORAL
  Filled 2018-02-22: qty 2

## 2018-02-22 MED ORDER — POTASSIUM CHLORIDE CRYS ER 20 MEQ PO TBCR: 20.0000 meq | EXTENDED_RELEASE_TABLET | Freq: Every day | ORAL | 0 refills | Status: DC

## 2018-02-22 NOTE — Progress Notes (Signed)
Dr Darrick Meigs notified of positive c diff results. No new orders at this time

## 2018-02-22 NOTE — Progress Notes (Signed)
Patient discharged home with instructions given on medications and follow up visits,patient verbalized understanding.IV discontinued,catheter intact. Prescriptions sent to Pharmacy of choice documented on AVS. Accompanied by staff to  an awaiting vehicle.

## 2018-02-22 NOTE — Discharge Summary (Signed)
Physician Discharge Summary  Melanie Bradley QJJ:[id number] DOB: [birth year] DOA: 02/20/2018  PCP: Sharilyn Sites, MD  Admit date: 02/20/2018 Discharge date: 02/22/2018  Admitted From: Home Disposition: Home  Recommendations for Outpatient Follow-up:  1. Follow up with PCP in 1-2 weeks 2. Please obtain BMP/CBC in one week 3. Antihypertensives currently on hold.  Resume as an outpatient as appropriate  Discharge Condition: Stable CODE STATUS: Full code Diet recommendation: Heart healthy  Brief/Interim Summary: 68 year old female with a history of dementia, hypertension, hypothyroidism, brought to the hospital with vomiting, diarrhea and hypotension.  Patient found to have elevated WBC count over 30,000.  CT scan of the abdomen pelvis did not show any acute findings.  Stool tested positive for C. difficile.  She was started on oral vancomycin and was hydrated with IV fluids.  Hemodynamics have stabilized.  Antihypertensives are currently on hold.  Although she is still having some loose stools, her husband feels that he can better manage her at home since her dementia appears to be causing her some emotional distress.  She is repeatedly asking to go home.  He feels that she is more confused in the hospital and feels that she will be more compliant with therapy at home.  He ensures that he will encourage her to aggressively hydrate with oral fluids.  She will continue on daily potassium for the next few days.  We will also start on probiotics.  She should follow-up with her primary care physician in the next week.  Discharge Diagnoses:  Principal Problem:   C. difficile enteritis Active Problems:   Hypertension   Sepsis ([place])   Leukocytosis   Vomiting and diarrhea   Dementia without behavioral disturbance (HCC)   Hypothyroidism   Diarrhea    Discharge Instructions   Allergies as of 02/22/2018      Reactions   Penicillins Hives, Swelling   Has patient had a PCN reaction causing  IMMEDIATE RASH, FACIAL/TONGUE/THROAT SWELLING, SOB, OR LIGHTHEADEDNESS WITH HYPOTENSION:  #  #  #  YES  #  #  #  Has patient had a PCN reaction causing severe rash involving mucus membranes or skin necrosis: No Has patient had a PCN reaction that required hospitalization No Has patient had a PCN reaction occurring within the last 10 years: No If all of the above answers are "NO", then may proceed with Cephalosporin use.   Benadryl [diphenhydramine Hcl] Hives   Msm [methylsulfonylmethane] Hives   Nubain [nalbuphine Hcl] Hives   Sulfa Antibiotics Hives   Codeine    UNSPECIFIED REACTION [IF AT ALL] REFUSES TO TAKE      Medication List    STOP taking these medications   amLODipine 10 MG tablet Commonly known as:  NORVASC   losartan 100 MG tablet Commonly known as:  COZAAR     TAKE these medications   aspirin 81 MG tablet Take 81 mg by mouth daily.   B-12 PO Take 6,000 mcg by mouth daily.   CALTRATE 600 1500 (600 Ca) MG Tabs tablet Generic drug:  calcium carbonate Take 600 mg of elemental calcium by mouth daily. Plus D3   donepezil 10 MG tablet Commonly known as:  ARICEPT TAKE 1 TABLET BY MOUTH AT BEDTIME.   escitalopram 10 MG tablet Commonly known as:  LEXAPRO Take 10 mg by mouth every evening.   memantine 10 MG tablet Commonly known as:  NAMENDA TAKE 1 TABLET BY MOUTH TWICE DAILY.   OVER THE COUNTER MEDICATION Take 1 tablet by mouth daily.  cognisense otc supplement   potassium chloride SA 20 MEQ tablet Commonly known as:  K-DUR,KLOR-CON Take 1 tablet (20 mEq total) by mouth daily.   saccharomyces boulardii 250 MG capsule Commonly known as:  FLORASTOR Take 1 capsule (250 mg total) by mouth 2 (two) times daily.   SYNTHROID 88 MCG tablet Generic drug:  levothyroxine Take 88 mcg by mouth daily. Except on friday   vancomycin 50 mg/mL  oral solution Commonly known as:  VANCOCIN Take 2.5 mLs (125 mg total) by mouth 4 (four) times daily.       Allergies   Allergen Reactions  . Penicillins Hives and Swelling    Has patient had a PCN reaction causing IMMEDIATE RASH, FACIAL/TONGUE/THROAT SWELLING, SOB, OR LIGHTHEADEDNESS WITH HYPOTENSION:  #  #  #  YES  #  #  #  Has patient had a PCN reaction causing severe rash involving mucus membranes or skin necrosis: No Has patient had a PCN reaction that required hospitalization No Has patient had a PCN reaction occurring within the last 10 years: No If all of the above answers are "NO", then may proceed with Cephalosporin use.   . Benadryl [Diphenhydramine Hcl] Hives  . Msm [Methylsulfonylmethane] Hives  . Nubain [Nalbuphine Hcl] Hives  . Sulfa Antibiotics Hives  . Codeine     UNSPECIFIED REACTION [IF AT ALL] REFUSES TO TAKE    Consultations:     Procedures/Studies: Dg Chest 2 View  Result Date: 02/21/2018 CLINICAL DATA:  Initial evaluation for acute cough, weakness. EXAM: CHEST - 2 VIEW COMPARISON:  Prior radiograph from 05/08/2013. FINDINGS: Cardiac and mediastinal silhouettes are stable in size and contour, and remain within normal limits. Lungs well inflated. No focal infiltrates, edema, or effusion. No pneumothorax. Nipple shadows overlie the bilateral lung bases. Compression deformity of the L1 vertebral body, similar to previous exams. Sequelae of prior ORIF at the proximal left humerus. No acute osseous abnormality. IMPRESSION: No radiographic evidence for active cardiopulmonary disease. Electronically Signed   By: Jeannine Boga M.D.   On: 02/21/2018 01:15   Ct Abdomen Pelvis W Contrast  Result Date: 02/21/2018 CLINICAL DATA:  Decreased appetite EXAM: CT ABDOMEN AND PELVIS WITH CONTRAST TECHNIQUE: Multidetector CT imaging of the abdomen and pelvis was performed using the standard protocol following bolus administration of intravenous contrast. CONTRAST:  82mL OMNIPAQUE IOHEXOL 300 MG/ML  SOLN COMPARISON:  07/21/2009 CT, lumbar spine 01/28/2016 FINDINGS: Lower chest: Normal heart size.  Atelectasis at the lung bases. No effusion or pneumothorax. Hepatobiliary: No focal liver abnormality is seen. Status post cholecystectomy. No biliary dilatation. Pancreas: Atrophy of the pancreatic gland. No pancreatic ductal dilatation or surrounding inflammatory changes. Spleen: Normal Adrenals/Urinary Tract: Interval increase in size of left adrenal mass now measuring 3.4 x 3.6 cm, previously 2.5 cm. 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 Stomach/Bowel: Small hiatal hernia. Physiologic distention of the stomach normal small bowel rotation. No bowel obstruction or inflammation. The distal and terminal ileum are normal. The appendix is surgically absent. Antegrade flow of contrast is demonstrated into the colon. There is extensive distal descending in sigmoid colonic diverticulosis without acute diverticulitis. Vascular/Lymphatic: Aorto iliac atherosclerosis. Patent branch vessels. No aneurysm or dissection. Reproductive: Uterus and bilateral adnexa are unremarkable. Other: No abdominal wall hernia or abnormality. No abdominopelvic ascites. Musculoskeletal: Chronic marked biconcave osteoporotic compression fracture T12 with 4 mm of retropulsion of the superior endplate. Chronic mild superior endplate compression of L3 25%. Degenerative disc disease with vacuum phenomenon noted at L4-5.  No pars defects or listhesis. IMPRESSION: 1. Interval increase in size of left adrenal mass now measuring 3.4 x 3.6 cm, previously 2.5 cm. 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 2. Status post cholecystectomy. 3. Colonic diverticulosis without acute diverticulitis. 4. Chronic marked biconcave osteoporotic compression fracture of T12 with 4 mm of retropulsion of the superior endplate. Chronic mild superior endplate compression of L3. Electronically Signed   By: Ashley Royalty M.D.   On: 02/21/2018 03:28       Subjective: Feeling better today.  Still  having some loose stools.  No vomiting.  Discharge Exam: Vitals:   02/21/18 1553 02/21/18 1555 02/21/18 2103 02/22/18 0521  BP:  (!) 126/91 95/72 125/90  Pulse:  75 86 76  Resp: 16  16 18   Temp:  98 F (36.7 C) 97.8 F (36.6 C) 97.6 F (36.4 C)  TempSrc: Oral Oral Oral Oral  SpO2:  100% 94% 97%  Weight: 49.6 kg     Height: 5\' 2"  (1.575 m)       General: Pt is alert, awake, not in acute distress Cardiovascular: RRR, S1/S2 +, no rubs, no gallops Respiratory: CTA bilaterally, no wheezing, no rhonchi Abdominal: Soft, NT, ND, bowel sounds + Extremities: no edema, no cyanosis    The results of significant diagnostics from this hospitalization (including imaging, microbiology, ancillary and laboratory) are listed below for reference.     Microbiology: Recent Results (from the past 240 hour(s))  Blood culture (routine x 2)     Status: None (Preliminary result)   Collection Time: 02/20/18 11:57 PM  Result Value Ref Range Status   Specimen Description BLOOD RIGHT ARM  Final   Special Requests   Final    BOTTLES DRAWN AEROBIC AND ANAEROBIC Blood Culture adequate volume   Culture   Final    NO GROWTH 1 DAY Performed at [facility], [address], [place], [place] [address]    Report Status PENDING  Incomplete  Blood culture (routine x 2)     Status: None (Preliminary result)   Collection Time: 02/21/18 12:04 AM  Result Value Ref Range Status   Specimen Description BLOOD LEFT ARM  Final   Special Requests   Final    BOTTLES DRAWN AEROBIC AND ANAEROBIC Blood Culture adequate volume   Culture   Final    NO GROWTH 1 DAY Performed at [facility], [address] [place], [place] [address]    Report Status PENDING  Incomplete  Urine culture     Status: None   Collection Time: 02/21/18  2:19 AM  Result Value Ref Range Status   Specimen Description   Final    URINE, CLEAN CATCH Performed at [facility], [address] [place], [place] [address]    Special Requests   Final     NONE Performed at [facility], [address], [place], [place] [address]    Culture   Final    NO GROWTH Performed at [facility], [place] [address] [place], [place] [address]    Report Status 02/22/2018 FINAL  Final  C difficile quick scan w PCR reflex     Status: Abnormal   Collection Time: 02/22/18  4:39 AM  Result Value Ref Range Status   C Diff antigen POSITIVE (A) NEGATIVE Final   C Diff toxin POSITIVE (A) NEGATIVE Final   C Diff interpretation Toxin producing C. difficile detected.  Final    Comment: CRITICAL RESULT CALLED TO, READ BACK  BY AND VERIFIED WITH: J MANNS,RN @0529  02/22/18 [facility] Performed at [facility], [address] [place], [place] [address]      Labs: BNP (last 3 results) No results for input(s): BNP in the last 8760 hours. Basic Metabolic Panel: Recent Labs  Lab 02/20/18 2357 02/22/18 0708  NA 132* 139  K 2.9* 3.5  CL 94* 108  CO2 25 26  GLUCOSE 117* 94  BUN 15 12  CREATININE 1.27* 0.84  CALCIUM 8.8* 8.1*   Liver Function Tests: Recent Labs  Lab 02/20/18 2357 02/22/18 0708  AST 21 11*  ALT 13 8  ALKPHOS 85 71  BILITOT 0.5 0.4  PROT 7.4 6.0*  ALBUMIN 3.3* 2.7*   Recent Labs  Lab 02/20/18 2357  LIPASE 31   No results for input(s): AMMONIA in the last 168 hours. CBC: Recent Labs  Lab 02/20/18 2357 02/22/18 0708  WBC 32.9* 19.2*  NEUTROABS 27.4*  --   HGB 13.5 11.0*  HCT 42.2 34.4*  MCV 96.1 97.5  PLT 428* 397   Cardiac Enzymes: No results for input(s): CKTOTAL, CKMB, CKMBINDEX, TROPONINI in the last 168 hours. BNP: Invalid input(s): POCBNP CBG: No results for input(s): GLUCAP in the last 168 hours. D-Dimer No results for input(s): DDIMER in the last 72 hours. Hgb A1c No results for input(s): HGBA1C in the last 72 hours. Lipid Profile No results for input(s): CHOL, HDL, LDLCALC, TRIG, CHOLHDL, LDLDIRECT in the last 72 hours. Thyroid function studies No results for input(s): TSH, T4TOTAL, T3FREE, THYROIDAB in  the last 72 hours.  Invalid input(s): FREET3 Anemia work up No results for input(s): VITAMINB12, FOLATE, FERRITIN, TIBC, IRON, RETICCTPCT in the last 72 hours. Urinalysis    Component Value Date/Time   COLORURINE AMBER (A) 02/21/2018 0215   APPEARANCEUR CLEAR 02/21/2018 0215   LABSPEC 1.006 02/21/2018 0215   PHURINE 5.0 02/21/2018 0215   GLUCOSEU NEGATIVE 02/21/2018 0215   HGBUR SMALL (A) 02/21/2018 0215   BILIRUBINUR NEGATIVE 02/21/2018 0215   KETONESUR NEGATIVE 02/21/2018 0215   PROTEINUR NEGATIVE 02/21/2018 0215   NITRITE NEGATIVE 02/21/2018 0215   LEUKOCYTESUR NEGATIVE 02/21/2018 0215   Sepsis Labs Invalid input(s): PROCALCITONIN,  WBC,  LACTICIDVEN Microbiology Recent Results (from the past 240 hour(s))  Blood culture (routine x 2)     Status: None (Preliminary result)   Collection Time: 02/20/18 11:57 PM  Result Value Ref Range Status   Specimen Description BLOOD RIGHT ARM  Final   Special Requests   Final    BOTTLES DRAWN AEROBIC AND ANAEROBIC Blood Culture adequate volume   Culture   Final    NO GROWTH 1 DAY Performed at [facility], [address], [place], [place] [address]    Report Status PENDING  Incomplete  Blood culture (routine x 2)     Status: None (Preliminary result)   Collection Time: 02/21/18 12:04 AM  Result Value Ref Range Status   Specimen Description BLOOD LEFT ARM  Final   Special Requests   Final    BOTTLES DRAWN AEROBIC AND ANAEROBIC Blood Culture adequate volume   Culture   Final    NO GROWTH 1 DAY Performed at [facility], [address], [place], [place] [address]    Report Status PENDING  Incomplete  Urine culture     Status: None   Collection Time: 02/21/18  2:19 AM  Result Value Ref Range Status   Specimen Description   Final    URINE, CLEAN CATCH Performed at [facility], [place]  [address] [place], [place] [address]    Special Requests   Final    NONE Performed at [facility], [address], [place], [place] [address]     Culture   Final    NO GROWTH Performed at [facility], [place] [address] [place], [place] [address]    Report Status 02/22/2018 FINAL  Final  C difficile quick scan w PCR reflex     Status: Abnormal   Collection Time: 02/22/18  4:39 AM  Result Value Ref Range Status   C Diff antigen POSITIVE (A) NEGATIVE Final   C Diff toxin POSITIVE (A) NEGATIVE Final   C Diff interpretation Toxin producing C. difficile detected.  Final    Comment: CRITICAL RESULT CALLED TO, READ BACK BY AND VERIFIED WITH: Willette Alma @0529  02/22/18 [facility] Performed at [facility], [address] [place],  [address]      Time coordinating discharge: 43mins  SIGNED:   Kathie Dike, MD  [facility] 02/22/2018, 11:16 AM Pager   If 7PM-7AM, please contact night-coverage [email or web address] Password [email or web address]

## 2018-02-26 LAB — CULTURE, BLOOD (ROUTINE X 2)
CULTURE: NO GROWTH
Culture: NO GROWTH
SPECIAL REQUESTS: ADEQUATE
Special Requests: ADEQUATE

## 2018-03-01 DIAGNOSIS — A09 Infectious gastroenteritis and colitis, unspecified: Secondary | ICD-10-CM | POA: Diagnosis not present

## 2018-03-01 DIAGNOSIS — A0472 Enterocolitis due to Clostridium difficile, not specified as recurrent: Secondary | ICD-10-CM | POA: Diagnosis not present

## 2018-03-01 DIAGNOSIS — Z681 Body mass index (BMI) 19 or less, adult: Secondary | ICD-10-CM | POA: Diagnosis not present

## 2018-03-01 DIAGNOSIS — I4891 Unspecified atrial fibrillation: Secondary | ICD-10-CM | POA: Diagnosis not present

## 2018-03-18 DIAGNOSIS — I10 Essential (primary) hypertension: Secondary | ICD-10-CM | POA: Diagnosis not present

## 2018-03-18 DIAGNOSIS — Z681 Body mass index (BMI) 19 or less, adult: Secondary | ICD-10-CM | POA: Diagnosis not present

## 2018-05-23 DIAGNOSIS — E860 Dehydration: Secondary | ICD-10-CM | POA: Diagnosis not present

## 2018-05-23 DIAGNOSIS — I959 Hypotension, unspecified: Secondary | ICD-10-CM | POA: Diagnosis not present

## 2018-05-23 DIAGNOSIS — K529 Noninfective gastroenteritis and colitis, unspecified: Secondary | ICD-10-CM | POA: Diagnosis not present

## 2018-05-31 IMAGING — DX DG WRIST COMPLETE 3+V*L*
4 series · 4 of 4 positions shown · non-contrast
Comparison: None.

CLINICAL DATA: Left wrist pain after fall today.

EXAM:
LEFT WRIST - COMPLETE 3+ VIEW

[wrist pa]
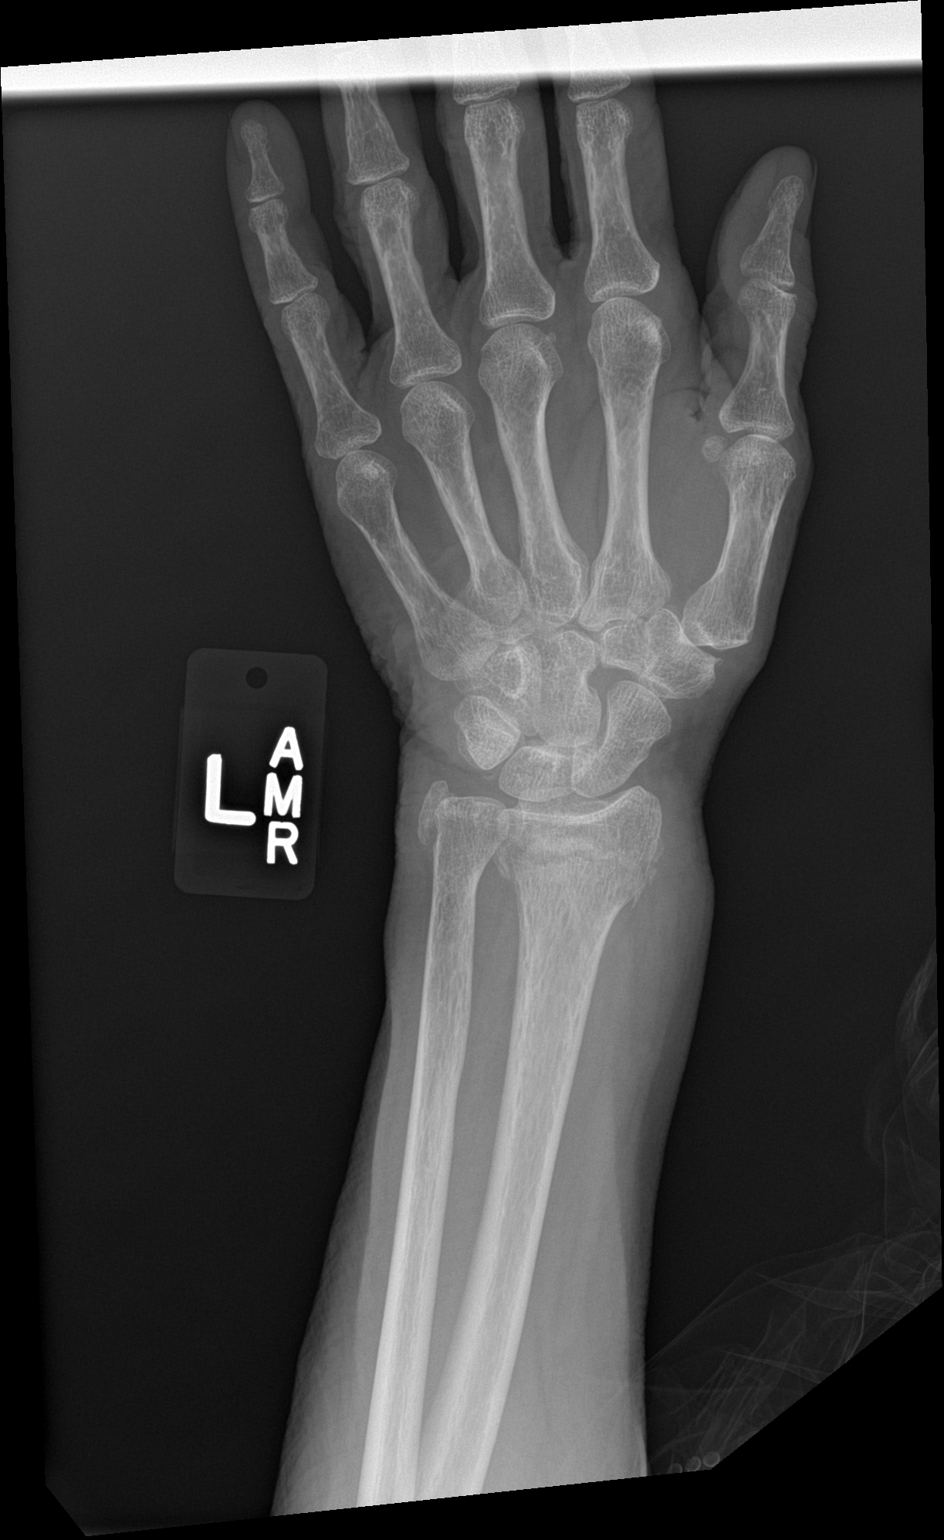

[wrist obl]
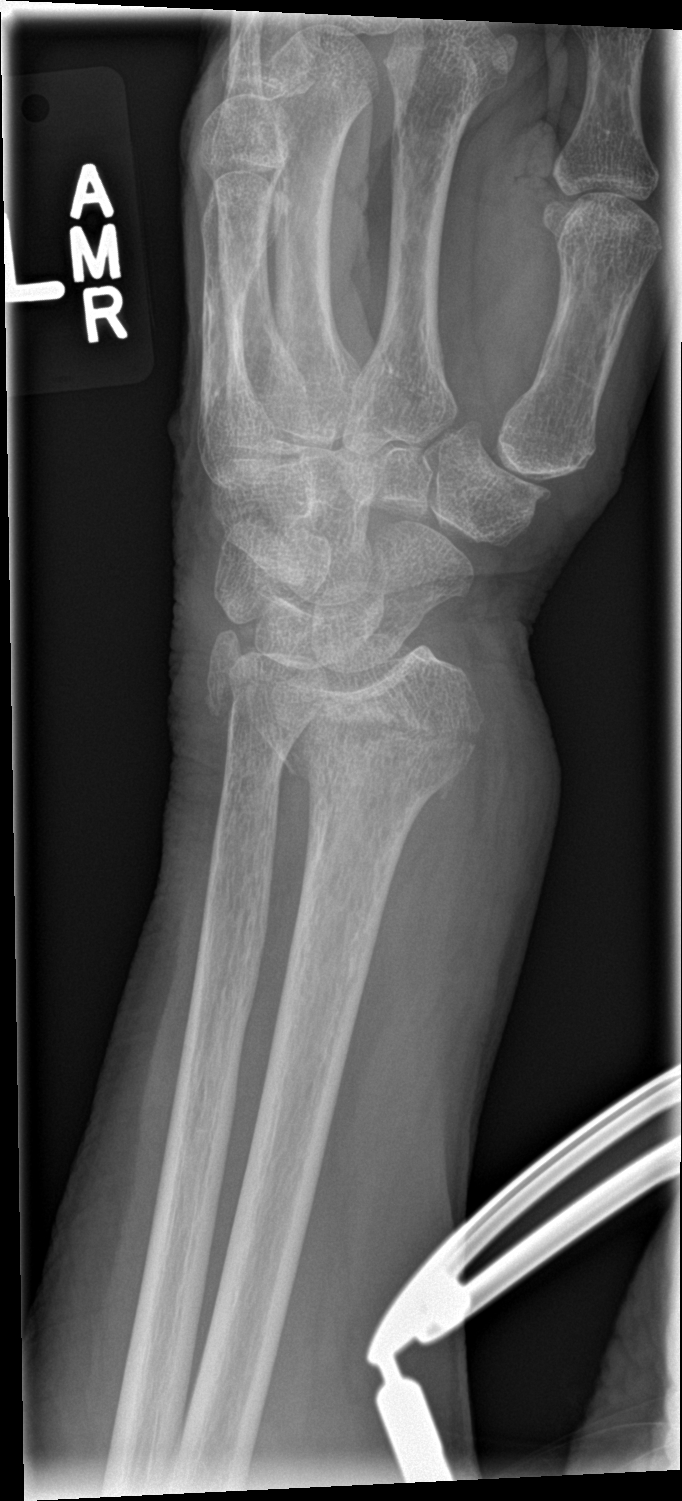

[wrist lat]
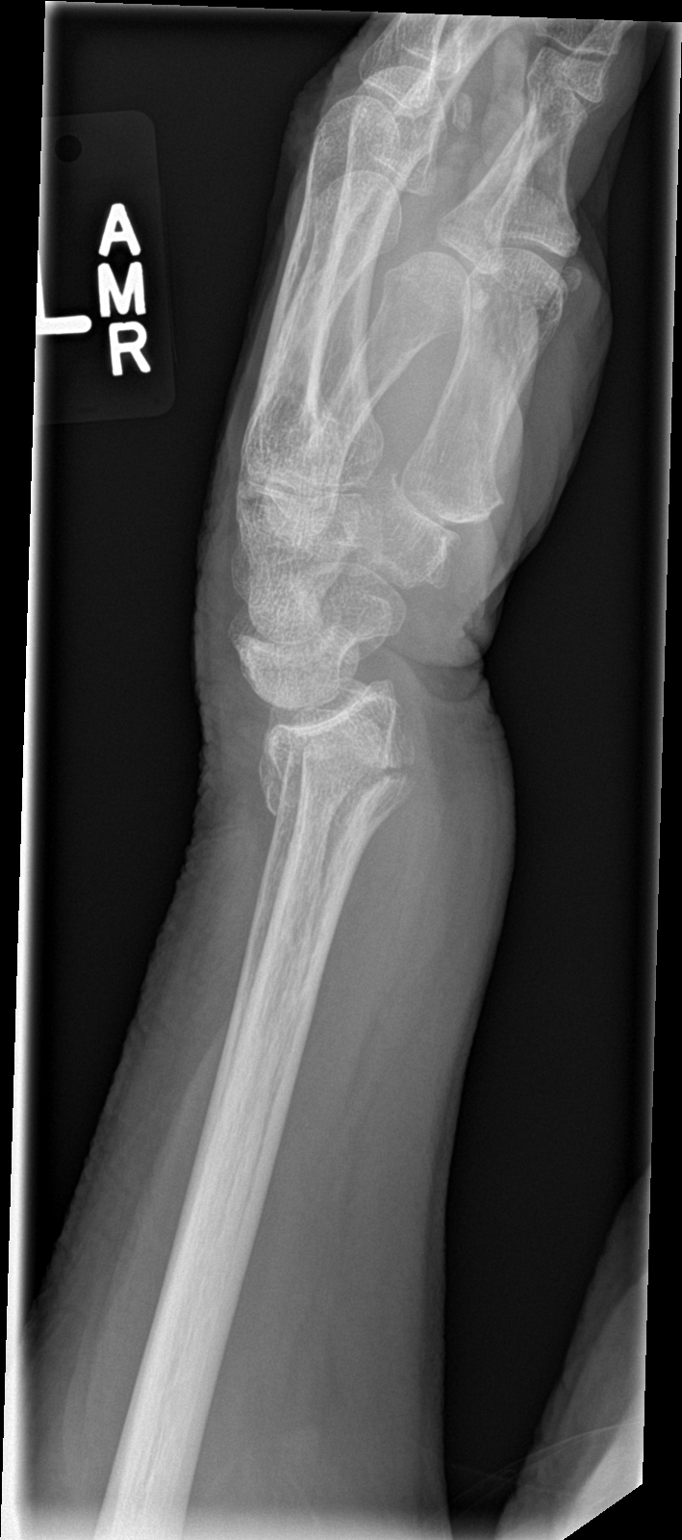

[wrist navicular]
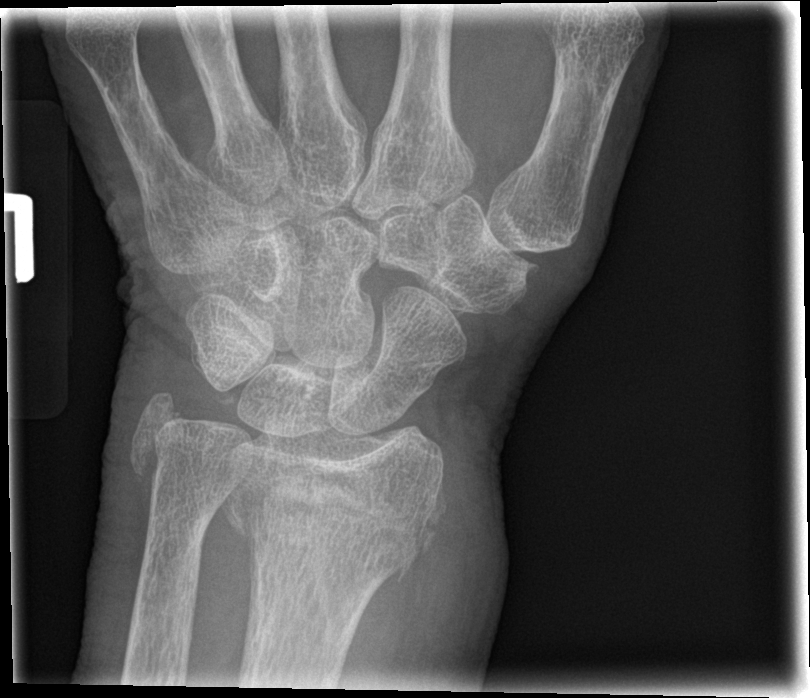

[4 of 4 positions shown; findings below may reference images not displayed]

FINDINGS: Moderately angulated fracture is seen involving the distal left
radius. Probable old fracture is seen involving the ulnar styloid.
Joint spaces are intact. No soft tissue abnormality is noted.
IMPRESSION: Moderately angulated distal left radial fracture.

## 2018-05-31 IMAGING — DX DG SHOULDER 2+V*L*
2 series · 2 of 2 positions shown · non-contrast
Comparison: None.

CLINICAL DATA: Left shoulder pain after fall today.

EXAM:
LEFT SHOULDER - 2+ VIEW

[shoulder ap]
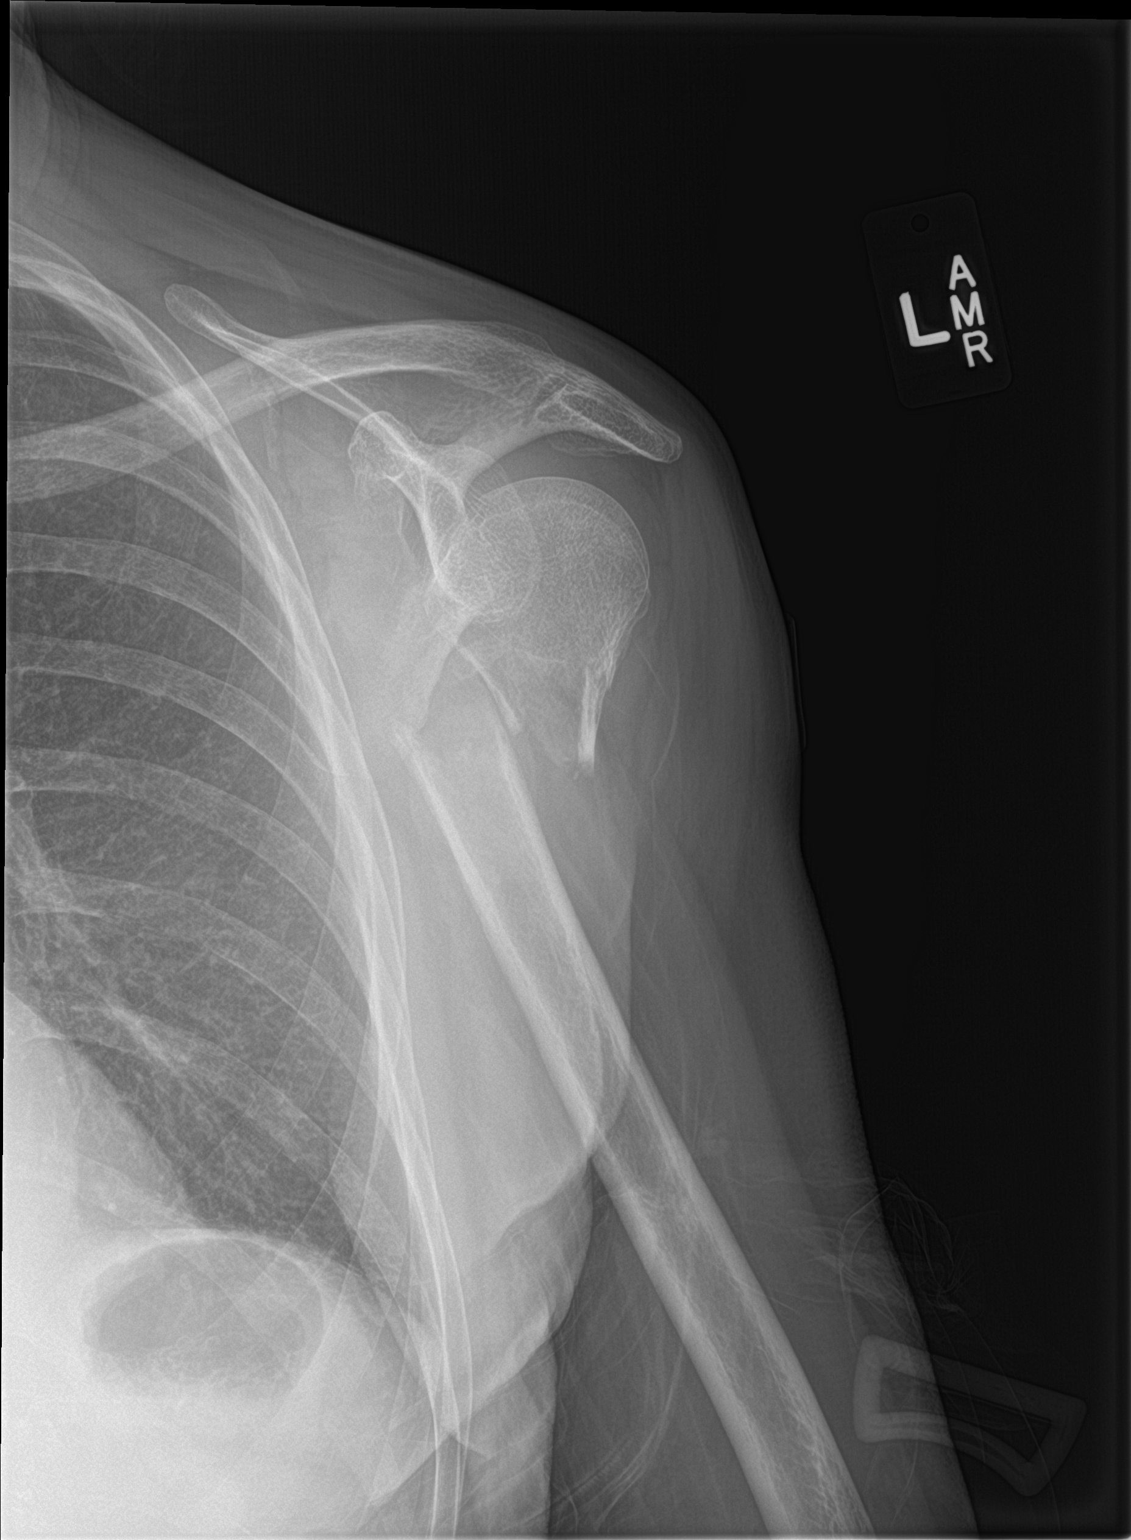

[shoulder y view]
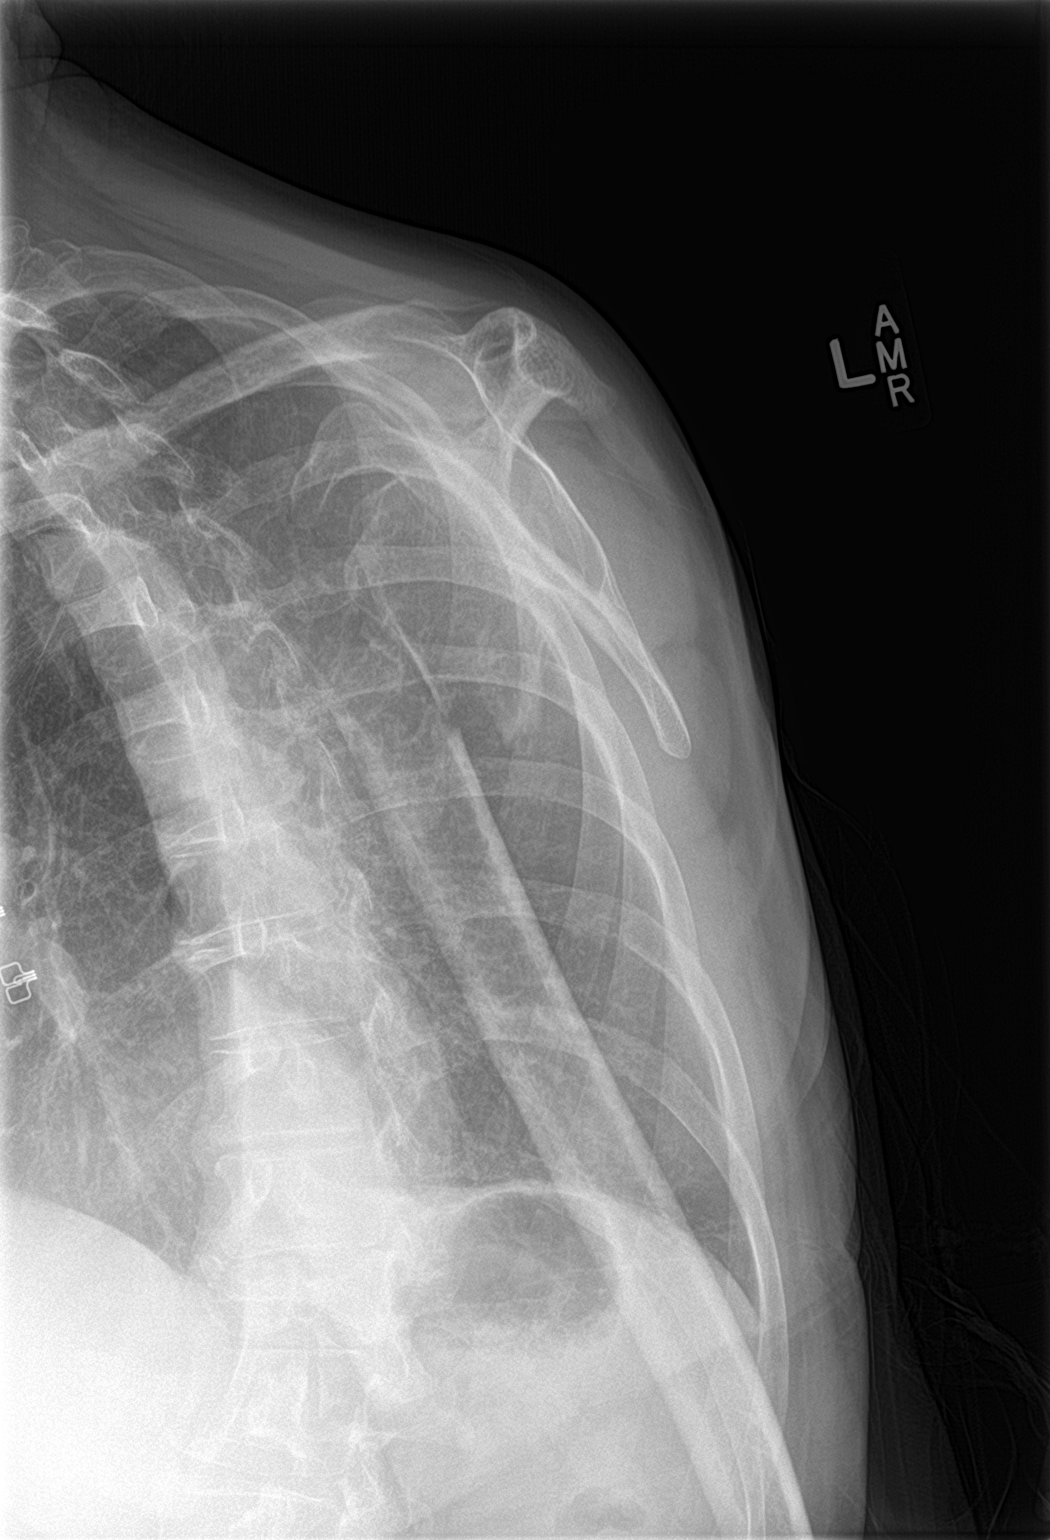

[2 of 2 positions shown; findings below may reference images not displayed]

FINDINGS: Severely displaced and probably comminuted fracture is seen
involving the left humeral neck. Left humeral head is situated
within the glenoid fossa. Visualized ribs appear normal.
IMPRESSION: Severely displaced and probably comminuted proximal left humeral
neck fracture.

## 2018-07-25 DIAGNOSIS — E039 Hypothyroidism, unspecified: Secondary | ICD-10-CM | POA: Diagnosis not present

## 2018-07-25 DIAGNOSIS — M859 Disorder of bone density and structure, unspecified: Secondary | ICD-10-CM | POA: Diagnosis not present

## 2018-07-25 DIAGNOSIS — D4410 Neoplasm of uncertain behavior of unspecified adrenal gland: Secondary | ICD-10-CM | POA: Diagnosis not present

## 2018-07-25 DIAGNOSIS — I10 Essential (primary) hypertension: Secondary | ICD-10-CM | POA: Diagnosis not present

## 2018-07-25 DIAGNOSIS — E559 Vitamin D deficiency, unspecified: Secondary | ICD-10-CM | POA: Diagnosis not present

## 2018-08-01 DIAGNOSIS — E559 Vitamin D deficiency, unspecified: Secondary | ICD-10-CM | POA: Diagnosis not present

## 2018-08-01 DIAGNOSIS — E039 Hypothyroidism, unspecified: Secondary | ICD-10-CM | POA: Diagnosis not present

## 2018-08-01 DIAGNOSIS — D4410 Neoplasm of uncertain behavior of unspecified adrenal gland: Secondary | ICD-10-CM | POA: Diagnosis not present

## 2018-08-19 ENCOUNTER — Other Ambulatory Visit: Payer: Self-pay | Admitting: Endocrinology

## 2018-08-19 DIAGNOSIS — E230 Hypopituitarism: Secondary | ICD-10-CM

## 2018-09-02 ENCOUNTER — Other Ambulatory Visit: Payer: Self-pay

## 2018-09-02 ENCOUNTER — Ambulatory Visit: Payer: Medicare HMO | Admitting: Adult Health

## 2018-09-02 ENCOUNTER — Encounter: Payer: Self-pay | Admitting: Adult Health

## 2018-09-02 VITALS — BP 124/72 | HR 76 | Temp 98.2°F | Ht 62.0 in | Wt 113.4 lb

## 2018-09-02 DIAGNOSIS — R413 Other amnesia: Secondary | ICD-10-CM | POA: Diagnosis not present

## 2018-09-02 MED ORDER — MEMANTINE HCL 10 MG PO TABS: 10.0000 mg | ORAL_TABLET | Freq: Two times a day (BID) | ORAL | 3 refills | Status: DC

## 2018-09-02 MED ORDER — DONEPEZIL HCL 10 MG PO TABS: 10.0000 mg | ORAL_TABLET | Freq: Every day | ORAL | 3 refills | Status: DC

## 2018-09-02 NOTE — Patient Instructions (Signed)
Your Plan: ° °Continue Aricept and namenda °Memory score is stable °If your symptoms worsen or you develop new symptoms please let us know.  ° ° °Thank you for coming to see us at [facility]. I hope we have been able to provide you high quality care today. ° °You may receive a patient satisfaction survey over the next few weeks. We would appreciate your feedback and comments so that we may continue to improve ourselves and the health of our patients. ° °

## 2018-09-02 NOTE — Progress Notes (Signed)
I have read the note, and I agree with the clinical assessment and plan.  Charles K Willis   

## 2018-09-02 NOTE — Progress Notes (Signed)
PATIENT: Melanie Bradley DOB: 01/10/1951  REASON FOR VISIT: follow up HISTORY FROM: patient  HISTORY OF PRESENT ILLNESS: Today 09/02/18:  Ms. Herrmann is a 68 year old female with a history of memory disturbance.  She returns today for follow-up.  She feels that she is doing well.  She continues on Aricept and Namenda.  She continues to live at home with her husband.  She is able to complete all ADLs independently.  She states that she only drives short distances.  Typically her husband drives.  She still does all the cooking.  Her husband manages the finances.  She denies any trouble sleeping.  She states that she has remained socially active.  She returns today for follow-up.  HISTORY 12/26/17:  Ms. Riccardi is a 68 year old female with a history of memory disturbance.  She returns today for follow-up.  She feels that her memory has remained stable.  She states that she tries remain active.  She likes to go fishing with her husband.  She is able to complete all ADLs independently.  She still cooks regularly.  Denies any trouble sleeping.  Reports good appetite.  Her husband manages the finances.  She continues on Aricept and Namenda.  She returns today for evaluation.   REVIEW OF SYSTEMS: Out of a complete 14 system review of symptoms, the patient complains only of the following symptoms, and all other reviewed systems are negative.  See HPI  ALLERGIES: Allergies  Allergen Reactions  . Penicillins Hives and Swelling    Has patient had a PCN reaction causing IMMEDIATE RASH, FACIAL/TONGUE/THROAT SWELLING, SOB, OR LIGHTHEADEDNESS WITH HYPOTENSION:  #  #  #  YES  #  #  #  Has patient had a PCN reaction causing severe rash involving mucus membranes or skin necrosis: No Has patient had a PCN reaction that required hospitalization No Has patient had a PCN reaction occurring within the last 10 years: No If all of the above answers are "NO", then may proceed with Cephalosporin use.    . Benadryl [Diphenhydramine Hcl] Hives  . Msm [Methylsulfonylmethane] Hives  . Nubain [Nalbuphine Hcl] Hives  . Sulfa Antibiotics Hives  . Codeine     UNSPECIFIED REACTION [IF AT ALL] REFUSES TO TAKE    HOME MEDICATIONS: Outpatient Medications Prior to Visit  Medication Sig Dispense Refill  . aspirin 81 MG tablet Take 81 mg by mouth daily.    . calcium carbonate (CALTRATE 600) 1500 (600 Ca) MG TABS tablet Take 600 mg of elemental calcium by mouth daily. Plus D3    . Cyanocobalamin (B-12 PO) Take 6,000 mcg by mouth daily.    Marland Kitchen donepezil (ARICEPT) 10 MG tablet TAKE 1 TABLET BY MOUTH AT BEDTIME. 90 tablet 1  . escitalopram (LEXAPRO) 10 MG tablet Take 10 mg by mouth every evening.   0  . memantine (NAMENDA) 10 MG tablet TAKE 1 TABLET BY MOUTH TWICE DAILY. 180 tablet 1  . OVER THE COUNTER MEDICATION Take 1 tablet by mouth daily. cognisense otc supplement    . potassium chloride SA (K-DUR,KLOR-CON) 20 MEQ tablet Take 1 tablet (20 mEq total) by mouth daily. 5 tablet 0  . saccharomyces boulardii (FLORASTOR) 250 MG capsule Take 1 capsule (250 mg total) by mouth 2 (two) times daily. 60 capsule 0  . SYNTHROID 88 MCG tablet Take 88 mcg by mouth daily. Except on friday    . vancomycin (VANCOCIN) 50 mg/mL oral solution Take 2.5 mLs (125 mg total) by mouth 4 (four) times  daily. 100 mL 0   No facility-administered medications prior to visit.     PAST MEDICAL HISTORY: Past Medical History:  Diagnosis Date  . Atrial fibrillation ([place])   . Bloating   . Complication of anesthesia     " hard to wake up sometimes "  . Dementia ([place])   . Diverticula of colon   . Epigastric burning sensation   . GERD (gastroesophageal reflux disease)   . Headache disorder 06/03/2014  . Headache(784.0)   . Helicobacter pylori gastritis 2004  . Hemorrhoids 06/26/03   Dr. Laural Golden [facility]  . Hiatal hernia 06/26/03   Dr. Laural Golden [facility]  . Hypertension   . Hypothyroidism   . 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 Memory difficulty 06/03/2014  . Shoulder fracture, left   . Spastic colon   . Wrist fracture     PAST SURGICAL HISTORY: Past Surgical History:  Procedure Laterality Date  . APPENDECTOMY    . CHOLECYSTECTOMY    . COLONOSCOPY  06/26/03   Dr. Kem Boroughs diverticula at the sigmoid colon with one of the transverse colon, normal terminal ileoscopy, small external hemorrhoids the  . COLONOSCOPY N/A 01/20/2016   Procedure: COLONOSCOPY;  Surgeon: Daneil Dolin, MD;  [facility];  Service: Endoscopy;  Laterality: N/A;  2:30 pm  . ESOPHAGOGASTRODUODENOSCOPY  03/15/2011   Dr. Trevor Iha hiatal hernia, abnormal gastric mucosa of unclear significance. Gastric biopsies negative, no H. pylori.Procedure: ESOPHAGOGASTRODUODENOSCOPY (EGD);  Surgeon: Daneil Dolin, MD;  Location: [facility];  Service: Endoscopy;  Laterality: N/A;  7:30  . ORIF HUMERUS FRACTURE Left 05/29/2016   Procedure: LEFT OPEN REDUCTION INTERNAL FIXATION (ORIF) PROXIMAL HUMERUS FRACTURE;  Surgeon: Meredith Pel, MD;  Location: [facility];  Service: Orthopedics;  Laterality: Left;  . ORIF WRIST FRACTURE Left 05/29/2016   Procedure: OPEN REDUCTION INTERNAL FIXATION (ORIF) LEFT WRIST FRACTURE;  Surgeon: Meredith Pel, MD;  Location: [place];  Service: Orthopedics;  Laterality: Left;  . TUBAL LIGATION    . UPPER GASTROINTESTINAL ENDOSCOPY  06/26/03   Dr. Wynelle Bourgeois sliding hiatal hernia with 8 mm tongue of gastric type mucosa at distal esophagus bile in the stomach.    FAMILY HISTORY: Family History  Problem Relation Age of Onset  . Hypertension Mother   . Angina Mother   . COPD Father   . Heart failure Father   . Cancer Sister   . Dementia Sister   . Cardiomyopathy Brother   . Pneumonia Other   . Migraines Sister   . Colon cancer Neg Hx     SOCIAL HISTORY: Social History   Socioeconomic History  . Marital status: Married    Spouse name: Not on file  . Number of children: Not on file  .  Years of education: 64  . Highest education level: Not on file  Occupational History  . Not on file  Social Needs  . Financial resource strain: Patient refused  . Food insecurity    Worry: Patient refused    Inability: Patient refused  . Transportation needs    Medical: Patient refused    Non-medical: Patient refused  Tobacco Use  . Smoking status: Current Every Day Smoker    Packs/day: 1.00    Years: 45.00    Pack years: 45.00    Types: Cigarettes    Start date: 09/10/1966  . Smokeless tobacco: Never Used  . Tobacco comment: 12/21/16 1 PPD  Substance and Sexual Activity  .  Alcohol use: No    Alcohol/week: 0.0 standard drinks  . Drug use: No  . Sexual activity: Yes  Lifestyle  . Physical activity    Days per week: Patient refused    Minutes per session: Patient refused  . Stress: Patient refused  Relationships  . Social Herbalist on phone: Patient refused    Gets together: Patient refused    Attends religious service: Patient refused    Active member of club or organization: Patient refused    Attends meetings of clubs or organizations: Patient refused    Relationship status: Patient refused  . Intimate partner violence    Fear of current or ex partner: Patient refused    Emotionally abused: Patient refused    Physically abused: Patient refused    Forced sexual activity: Patient refused  Other Topics Concern  . Not on file  Social History Narrative   Patient is right handed.   Patient drinks 2 cups of caffeine daily.   Education [employer]      PHYSICAL EXAM  Vitals:   09/02/18 1326  BP: 124/72  Pulse: 76  Temp: 98.2 F (36.8 C)  Weight: 113 lb 6.4 oz (51.4 kg)  Height: 5\' 2"  (1.575 m)   Body mass index is 20.74 kg/m.   MMSE - Mini Mental State Exam 09/02/2018 12/26/2017 06/21/2017  Orientation to time 1 2 0  Orientation to Place 4 4 4   Registration 3 3 3   Attention/ Calculation 4 0 2  Recall 1 0 0  Language- name 2 objects 1 1 1    Language- repeat 1 1 1   Language- follow 3 step command 2 3 3   Language- follow 3 step command-comments - - -  Language- read & follow direction 1 1 1   Write a sentence 1 1 1   Copy design 1 1 0  Copy design-comments named 4 animals - -  Total score 20 17 16      Generalized: Well developed, in no acute distress   Neurological examination  Mentation: Alert oriented to time, place, history taking. Follows all commands speech and language fluent Cranial nerve II-XII: Pupils were equal round reactive to light. Extraocular movements were full, visual field were full on confrontational test.  Head turning and shoulder shrug  were normal and symmetric. Motor: The motor testing reveals 5 over 5 strength of all 4 extremities. Good symmetric motor tone is noted throughout.  Sensory: Sensory testing is intact to soft touch on all 4 extremities. No evidence of extinction is noted.  Coordination: Cerebellar testing reveals good finger-nose-finger and heel-to-shin bilaterally.  Gait and station: Gait is normal.  Reflexes: Deep tendon reflexes are symmetric and normal bilaterally.   DIAGNOSTIC DATA (LABS, IMAGING, TESTING) - I reviewed patient records, labs, notes, testing and imaging myself where available.  Lab Results  Component Value Date   WBC 19.2 (H) 02/22/2018   HGB 11.0 (L) 02/22/2018   HCT 34.4 (L) 02/22/2018   MCV 97.5 02/22/2018   PLT 397 02/22/2018      Component Value Date/Time   NA 139 02/22/2018 0708   NA 136 (A) 09/11/2006   K 3.5 02/22/2018 0708   CL 108 02/22/2018 0708   CO2 26 02/22/2018 0708   GLUCOSE 94 02/22/2018 0708   BUN 12 02/22/2018 0708   CREATININE 0.84 02/22/2018 0708   CREATININE 0.88 05/26/2011 0755   CALCIUM 8.1 (L) 02/22/2018 0708   PROT 6.0 (L) 02/22/2018 0708   ALBUMIN 2.7 (L) 02/22/2018 0708  AST 11 (L) 02/22/2018 0708   ALT 8 02/22/2018 0708   ALKPHOS 71 02/22/2018 0708   BILITOT 0.4 02/22/2018 0708   GFRNONAA >60 02/22/2018 0708   GFRAA  >60 02/22/2018 0708   Lab Results  Component Value Date   CHOL 173 05/09/2013   HDL 55 05/09/2013   LDLCALC 101 (H) 05/09/2013   TRIG 83 05/09/2013   CHOLHDL 3.1 05/09/2013   No results found for: HGBA1C Lab Results  Component Value Date   VITAMINB12 239 06/03/2014   Lab Results  Component Value Date   TSH 1.514 05/08/2013      ASSESSMENT AND PLAN 68 y.o. year old female  has a past medical history of Atrial fibrillation ([place]), Bloating, Complication of anesthesia, Dementia ([place]), Diverticula of colon, Epigastric burning sensation, GERD (gastroesophageal reflux disease), Headache disorder (06/03/2014), [id number](701.4), Helicobacter pylori gastritis (2004), Hemorrhoids (06/26/03), Hiatal hernia (06/26/03), Hypertension, Hypothyroidism, Left bundle branch block, Memory difficulty (06/03/2014), Shoulder fracture, left, Spastic colon, and Wrist fracture. here with:  1.  Memory disturbance  The patient's memory score has remained stable.  She will continue on Aricept and Namenda.  I have advised that if her symptoms worsen or she develops new symptoms she should let us know.  She will follow-up in 6 months or sooner if needed.   I spent 15 minutes with the patient. 50% of this time was spent reviewing her memory score   Ward Givens, MSN, NP-C 09/02/2018, 1:22 PM [facility] [address], [place], [place] [address] [phone]

## 2018-09-12 ENCOUNTER — Other Ambulatory Visit: Payer: Self-pay

## 2018-09-12 ENCOUNTER — Ambulatory Visit
Admission: RE | Admit: 2018-09-12 | Discharge: 2018-09-12 | Disposition: A | Discharge status: Home / Self Care | Payer: Medicare HMO | Source: Ambulatory Visit | Attending: Endocrinology | Admitting: Endocrinology

## 2018-09-12 DIAGNOSIS — E230 Hypopituitarism: Secondary | ICD-10-CM

## 2018-09-12 MED ORDER — GADOBENATE DIMEGLUMINE 529 MG/ML IV SOLN
6.0000 mL | Freq: Once | INTRAVENOUS | Status: AC | PRN
  Administered 2018-09-12: 6 mL via INTRAVENOUS

## 2018-09-23 ENCOUNTER — Other Ambulatory Visit (HOSPITAL_COMMUNITY): Payer: Self-pay | Admitting: Family Medicine

## 2018-09-23 DIAGNOSIS — Z9289 Personal history of other medical treatment: Secondary | ICD-10-CM

## 2018-09-26 ENCOUNTER — Ambulatory Visit (HOSPITAL_COMMUNITY)
Admission: RE | Admit: 2018-09-26 | Discharge: 2018-09-26 | Disposition: A | Discharge status: Home / Self Care | Payer: Medicare HMO | Source: Ambulatory Visit | Attending: Family Medicine | Admitting: Family Medicine

## 2018-09-26 ENCOUNTER — Encounter (HOSPITAL_COMMUNITY): Payer: Self-pay

## 2018-09-26 ENCOUNTER — Other Ambulatory Visit: Payer: Self-pay

## 2018-09-26 DIAGNOSIS — Z1231 Encounter for screening mammogram for malignant neoplasm of breast: Secondary | ICD-10-CM | POA: Insufficient documentation

## 2018-09-26 DIAGNOSIS — Z9289 Personal history of other medical treatment: Secondary | ICD-10-CM

## 2018-10-03 DIAGNOSIS — R413 Other amnesia: Secondary | ICD-10-CM | POA: Diagnosis not present

## 2018-10-03 DIAGNOSIS — Z6821 Body mass index (BMI) 21.0-21.9, adult: Secondary | ICD-10-CM | POA: Diagnosis not present

## 2018-10-03 DIAGNOSIS — Z1389 Encounter for screening for other disorder: Secondary | ICD-10-CM | POA: Diagnosis not present

## 2018-10-03 DIAGNOSIS — Z0001 Encounter for general adult medical examination with abnormal findings: Secondary | ICD-10-CM | POA: Diagnosis not present

## 2018-10-03 DIAGNOSIS — Z719 Counseling, unspecified: Secondary | ICD-10-CM | POA: Diagnosis not present

## 2018-10-03 DIAGNOSIS — E559 Vitamin D deficiency, unspecified: Secondary | ICD-10-CM | POA: Diagnosis not present

## 2018-10-03 DIAGNOSIS — K589 Irritable bowel syndrome without diarrhea: Secondary | ICD-10-CM | POA: Diagnosis not present

## 2018-10-03 DIAGNOSIS — I10 Essential (primary) hypertension: Secondary | ICD-10-CM | POA: Diagnosis not present

## 2018-10-25 DIAGNOSIS — Z6820 Body mass index (BMI) 20.0-20.9, adult: Secondary | ICD-10-CM | POA: Diagnosis not present

## 2018-10-25 DIAGNOSIS — K5289 Other specified noninfective gastroenteritis and colitis: Secondary | ICD-10-CM | POA: Diagnosis not present

## 2018-10-26 ENCOUNTER — Encounter (HOSPITAL_COMMUNITY): Payer: Self-pay | Admitting: Emergency Medicine

## 2018-10-26 ENCOUNTER — Inpatient Hospital Stay (HOSPITAL_COMMUNITY)
Admission: EM | Admit: 2018-10-26 | Discharge: 2018-10-29 | DRG: 641 | Disposition: A | Discharge status: Home / Self Care | Payer: Medicare HMO | Attending: Internal Medicine | Admitting: Internal Medicine

## 2018-10-26 ENCOUNTER — Other Ambulatory Visit: Payer: Self-pay

## 2018-10-26 DIAGNOSIS — I10 Essential (primary) hypertension: Secondary | ICD-10-CM | POA: Diagnosis not present

## 2018-10-26 DIAGNOSIS — R55 Syncope and collapse: Secondary | ICD-10-CM | POA: Diagnosis present

## 2018-10-26 DIAGNOSIS — J449 Chronic obstructive pulmonary disease, unspecified: Secondary | ICD-10-CM | POA: Diagnosis present

## 2018-10-26 DIAGNOSIS — Z885 Allergy status to narcotic agent status: Secondary | ICD-10-CM | POA: Diagnosis not present

## 2018-10-26 DIAGNOSIS — E039 Hypothyroidism, unspecified: Secondary | ICD-10-CM | POA: Diagnosis present

## 2018-10-26 DIAGNOSIS — Z720 Tobacco use: Secondary | ICD-10-CM | POA: Diagnosis not present

## 2018-10-26 DIAGNOSIS — Z7982 Long term (current) use of aspirin: Secondary | ICD-10-CM

## 2018-10-26 DIAGNOSIS — Z825 Family history of asthma and other chronic lower respiratory diseases: Secondary | ICD-10-CM

## 2018-10-26 DIAGNOSIS — I959 Hypotension, unspecified: Secondary | ICD-10-CM | POA: Diagnosis present

## 2018-10-26 DIAGNOSIS — F17210 Nicotine dependence, cigarettes, uncomplicated: Secondary | ICD-10-CM | POA: Diagnosis present

## 2018-10-26 DIAGNOSIS — K219 Gastro-esophageal reflux disease without esophagitis: Secondary | ICD-10-CM | POA: Diagnosis present

## 2018-10-26 DIAGNOSIS — E860 Dehydration: Secondary | ICD-10-CM | POA: Diagnosis not present

## 2018-10-26 DIAGNOSIS — Z880 Allergy status to penicillin: Secondary | ICD-10-CM | POA: Diagnosis not present

## 2018-10-26 DIAGNOSIS — I428 Other cardiomyopathies: Secondary | ICD-10-CM | POA: Diagnosis not present

## 2018-10-26 DIAGNOSIS — Z79890 Hormone replacement therapy (postmenopausal): Secondary | ICD-10-CM | POA: Diagnosis not present

## 2018-10-26 DIAGNOSIS — I429 Cardiomyopathy, unspecified: Secondary | ICD-10-CM

## 2018-10-26 DIAGNOSIS — Z20828 Contact with and (suspected) exposure to other viral communicable diseases: Secondary | ICD-10-CM | POA: Diagnosis present

## 2018-10-26 DIAGNOSIS — Z888 Allergy status to other drugs, medicaments and biological substances status: Secondary | ICD-10-CM

## 2018-10-26 DIAGNOSIS — Z79899 Other long term (current) drug therapy: Secondary | ICD-10-CM | POA: Diagnosis not present

## 2018-10-26 DIAGNOSIS — K589 Irritable bowel syndrome without diarrhea: Secondary | ICD-10-CM | POA: Diagnosis present

## 2018-10-26 DIAGNOSIS — N179 Acute kidney failure, unspecified: Secondary | ICD-10-CM

## 2018-10-26 DIAGNOSIS — F0390 Unspecified dementia without behavioral disturbance: Secondary | ICD-10-CM | POA: Diagnosis present

## 2018-10-26 DIAGNOSIS — Z882 Allergy status to sulfonamides status: Secondary | ICD-10-CM | POA: Diagnosis not present

## 2018-10-26 DIAGNOSIS — I447 Left bundle-branch block, unspecified: Secondary | ICD-10-CM | POA: Diagnosis not present

## 2018-10-26 DIAGNOSIS — R197 Diarrhea, unspecified: Secondary | ICD-10-CM | POA: Diagnosis not present

## 2018-10-26 DIAGNOSIS — Z7983 Long term (current) use of bisphosphonates: Secondary | ICD-10-CM | POA: Diagnosis not present

## 2018-10-26 DIAGNOSIS — E876 Hypokalemia: Secondary | ICD-10-CM

## 2018-10-26 DIAGNOSIS — R69 Illness, unspecified: Secondary | ICD-10-CM | POA: Diagnosis not present

## 2018-10-26 DIAGNOSIS — I340 Nonrheumatic mitral (valve) insufficiency: Secondary | ICD-10-CM | POA: Diagnosis not present

## 2018-10-26 LAB — COMPREHENSIVE METABOLIC PANEL
ALT: 21 U/L (ref 0–44)
AST: 21 U/L (ref 15–41)
Albumin: 3.9 g/dL (ref 3.5–5.0)
Alkaline Phosphatase: 93 U/L (ref 38–126)
Anion gap: 7 (ref 5–15)
BUN: 20 mg/dL (ref 8–23)
CO2: 22 mmol/L (ref 22–32)
Calcium: 8.6 mg/dL — ABNORMAL LOW (ref 8.9–10.3)
Chloride: 106 mmol/L (ref 98–111)
Creatinine, Ser: 1.67 mg/dL — ABNORMAL HIGH (ref 0.44–1.00)
GFR calc Af Amer: 37 mL/min — ABNORMAL LOW (ref >60)
GFR calc non Af Amer: 32 mL/min — ABNORMAL LOW (ref >60)
Glucose, Bld: 106 mg/dL — ABNORMAL HIGH (ref 70–99)
Potassium: 2.5 mmol/L — CL (ref 3.5–5.1)
Sodium: 135 mmol/L (ref 135–145)
Total Bilirubin: 0.3 mg/dL (ref 0.3–1.2)
Total Protein: 7.7 g/dL (ref 6.5–8.1)

## 2018-10-26 LAB — CBC WITH DIFFERENTIAL/PLATELET
Abs Immature Granulocytes: 0.03 10*3/uL (ref 0.00–0.07)
Basophils Absolute: 0.1 10*3/uL (ref 0.0–0.1)
Basophils Relative: 1 %
Eosinophils Absolute: 0.3 10*3/uL (ref 0.0–0.5)
Eosinophils Relative: 3 %
HCT: 39.7 % (ref 36.0–46.0)
Hemoglobin: 12.9 g/dL (ref 12.0–15.0)
Immature Granulocytes: 0 %
Lymphocytes Relative: 20 %
Lymphs Abs: 1.6 10*3/uL (ref 0.7–4.0)
MCH: 30.9 pg (ref 26.0–34.0)
MCHC: 32.5 g/dL (ref 30.0–36.0)
MCV: 95.2 fL (ref 80.0–100.0)
Monocytes Absolute: 1.1 10*3/uL — ABNORMAL HIGH (ref 0.1–1.0)
Monocytes Relative: 13 %
Neutro Abs: 5.2 10*3/uL (ref 1.7–7.7)
Neutrophils Relative %: 63 %
Platelets: 406 10*3/uL — ABNORMAL HIGH (ref 150–400)
RBC: 4.17 MIL/uL (ref 3.87–5.11)
RDW: 13.1 % (ref 11.5–15.5)
WBC: 8.2 10*3/uL (ref 4.0–10.5)
nRBC: 0 % (ref 0.0–0.2)

## 2018-10-26 LAB — URINALYSIS, ROUTINE W REFLEX MICROSCOPIC
Bilirubin Urine: NEGATIVE
Glucose, UA: NEGATIVE mg/dL
Hgb urine dipstick: NEGATIVE
Ketones, ur: NEGATIVE mg/dL
Leukocytes,Ua: NEGATIVE
Nitrite: NEGATIVE
Protein, ur: NEGATIVE mg/dL
Specific Gravity, Urine: 1.013 (ref 1.005–1.030)
pH: 5 (ref 5.0–8.0)

## 2018-10-26 LAB — MAGNESIUM: Magnesium: 1.7 mg/dL (ref 1.7–2.4)

## 2018-10-26 LAB — TROPONIN I (HIGH SENSITIVITY)
Troponin I (High Sensitivity): 4 ng/L (ref <18)
Troponin I (High Sensitivity): 5 ng/L (ref <18)

## 2018-10-26 LAB — SARS CORONAVIRUS 2 BY RT PCR (HOSPITAL ORDER, PERFORMED IN ~~LOC~~ HOSPITAL LAB): SARS Coronavirus 2: NEGATIVE

## 2018-10-26 MED ORDER — CALCIUM CARBONATE ANTACID 500 MG PO CHEW
200.0000 mg | CHEWABLE_TABLET | Freq: Three times a day (TID) | ORAL | Status: DC
  Administered 2018-10-26 – 2018-10-29 (×8): 200 mg via ORAL
  Filled 2018-10-26 (×8): qty 1

## 2018-10-26 MED ORDER — VITAMIN B-12 1000 MCG PO TABS
6000.0000 ug | ORAL_TABLET | Freq: Every day | ORAL | Status: DC
  Administered 2018-10-27 – 2018-10-29 (×3): 6000 ug via ORAL
  Filled 2018-10-26 (×3): qty 6

## 2018-10-26 MED ORDER — ACETAMINOPHEN 650 MG RE SUPP: 650.0000 mg | Freq: Four times a day (QID) | RECTAL | Status: DC | PRN

## 2018-10-26 MED ORDER — SODIUM CHLORIDE 0.9 % IV BOLUS
1000.0000 mL | Freq: Once | INTRAVENOUS | Status: AC
  Administered 2018-10-26: 12:00:00 1000 mL via INTRAVENOUS

## 2018-10-26 MED ORDER — HEPARIN SODIUM (PORCINE) 5000 UNIT/ML IJ SOLN
5000.0000 [IU] | Freq: Three times a day (TID) | INTRAMUSCULAR | Status: DC
  Administered 2018-10-26 – 2018-10-29 (×9): 5000 [IU] via SUBCUTANEOUS
  Filled 2018-10-26 (×9): qty 1

## 2018-10-26 MED ORDER — ONDANSETRON HCL 4 MG PO TABS: 4.0000 mg | ORAL_TABLET | Freq: Four times a day (QID) | ORAL | Status: DC | PRN

## 2018-10-26 MED ORDER — NICOTINE 21 MG/24HR TD PT24
21.0000 mg | MEDICATED_PATCH | Freq: Every day | TRANSDERMAL | Status: DC
  Administered 2018-10-26 – 2018-10-29 (×4): 21 mg via TRANSDERMAL
  Filled 2018-10-26 (×4): qty 1

## 2018-10-26 MED ORDER — POTASSIUM CHLORIDE 10 MEQ/100ML IV SOLN
10.0000 meq | INTRAVENOUS | Status: AC
  Administered 2018-10-26 (×2): 10 meq via INTRAVENOUS
  Filled 2018-10-26 (×2): qty 100

## 2018-10-26 MED ORDER — ACETAMINOPHEN 325 MG PO TABS
650.0000 mg | ORAL_TABLET | Freq: Four times a day (QID) | ORAL | Status: DC | PRN
  Administered 2018-10-26: 650 mg via ORAL
  Filled 2018-10-26: qty 2

## 2018-10-26 MED ORDER — ASPIRIN EC 81 MG PO TBEC
81.0000 mg | DELAYED_RELEASE_TABLET | Freq: Every day | ORAL | Status: DC
  Administered 2018-10-27 – 2018-10-29 (×3): 81 mg via ORAL
  Filled 2018-10-26 (×3): qty 1

## 2018-10-26 MED ORDER — CALCIUM CARBONATE 1500 (600 CA) MG PO TABS: 600.0000 mg | ORAL_TABLET | Freq: Every day | ORAL | Status: DC

## 2018-10-26 MED ORDER — MEMANTINE HCL 10 MG PO TABS
10.0000 mg | ORAL_TABLET | Freq: Two times a day (BID) | ORAL | Status: DC
  Administered 2018-10-26 – 2018-10-29 (×6): 10 mg via ORAL
  Filled 2018-10-26 (×6): qty 1

## 2018-10-26 MED ORDER — LEVOTHYROXINE SODIUM 88 MCG PO TABS
88.0000 ug | ORAL_TABLET | Freq: Every day | ORAL | Status: DC
  Administered 2018-10-26 – 2018-10-29 (×4): 88 ug via ORAL
  Filled 2018-10-26 (×4): qty 1

## 2018-10-26 MED ORDER — POTASSIUM CHLORIDE CRYS ER 20 MEQ PO TBCR
20.0000 meq | EXTENDED_RELEASE_TABLET | Freq: Every day | ORAL | Status: DC
  Administered 2018-10-27 – 2018-10-29 (×3): 20 meq via ORAL
  Filled 2018-10-26 (×3): qty 1

## 2018-10-26 MED ORDER — HALOPERIDOL LACTATE 5 MG/ML IJ SOLN
2.0000 mg | Freq: Four times a day (QID) | INTRAMUSCULAR | Status: DC | PRN
  Administered 2018-10-26 – 2018-10-28 (×2): 2 mg via INTRAVENOUS
  Filled 2018-10-26 (×2): qty 1

## 2018-10-26 MED ORDER — SODIUM CHLORIDE 0.9 % IV BOLUS
1000.0000 mL | Freq: Once | INTRAVENOUS | Status: AC
  Administered 2018-10-26: 1000 mL via INTRAVENOUS

## 2018-10-26 MED ORDER — ESCITALOPRAM OXALATE 10 MG PO TABS
10.0000 mg | ORAL_TABLET | Freq: Every evening | ORAL | Status: DC
  Administered 2018-10-26 – 2018-10-28 (×3): 10 mg via ORAL
  Filled 2018-10-26 (×3): qty 1

## 2018-10-26 MED ORDER — POTASSIUM CHLORIDE CRYS ER 20 MEQ PO TBCR
40.0000 meq | EXTENDED_RELEASE_TABLET | Freq: Once | ORAL | Status: AC
  Administered 2018-10-26: 40 meq via ORAL
  Filled 2018-10-26: qty 2

## 2018-10-26 MED ORDER — MAGNESIUM SULFATE 2 GM/50ML IV SOLN
2.0000 g | Freq: Once | INTRAVENOUS | Status: AC
  Administered 2018-10-26: 14:00:00 2 g via INTRAVENOUS
  Filled 2018-10-26 (×2): qty 50

## 2018-10-26 MED ORDER — ONDANSETRON HCL 4 MG/2ML IJ SOLN: 4.0000 mg | Freq: Four times a day (QID) | INTRAMUSCULAR | Status: DC | PRN

## 2018-10-26 MED ORDER — DONEPEZIL HCL 5 MG PO TABS
10.0000 mg | ORAL_TABLET | Freq: Every day | ORAL | Status: DC
  Administered 2018-10-26 – 2018-10-28 (×3): 10 mg via ORAL
  Filled 2018-10-26 (×3): qty 2

## 2018-10-26 MED ORDER — POTASSIUM CHLORIDE IN NACL 40-0.9 MEQ/L-% IV SOLN
INTRAVENOUS | Status: DC
  Administered 2018-10-26 – 2018-10-28 (×4): 75 mL/h via INTRAVENOUS

## 2018-10-26 NOTE — ED Provider Notes (Signed)
[facility] EMERGENCY DEPARTMENT Provider Note   CSN: [id number] Arrival date & time: 10/26/18  0840     History   Chief Complaint Chief Complaint  Patient presents with  . Near Syncope    HPI Melanie Bradley is a 68 y.o. female with past medical history of left bundle branch block, C. difficile, GERD, COPD, hypertension, IBS, presenting to the emergency department with a syncopal episode that occurred prior to arrival.  Patient's husband states they were standing waiting for a table at a restaurant when the patient began feeling hot.  She took off her sweater and her mask and then had a syncopal episode.  Her husband states he caught her she did not fall or hit her head.  She was unconscious for about 30 seconds.  Patient states she feels well and has no symptoms.  She does endorse feeling very hot prior to the syncopal episode.  She denies prodrome of chest pain, shortness of breath, headache, vision changes.  She has been having diarrhea for 1 week.  She has had around 10 episodes per day of watery, nonbloody diarrhea.  No melena.  Her husband states he has been trying to keep her hydrated, however think she has not been hydrating well.  Patient denies any other complaints including no fever, abdominal pain, nausea, vomiting, urinary symptoms.  She states she feels "fine."  She had recent episode of C. difficile in January following 2 antibiotics for an upper respiratory infection.  She has had no recent antibiotics.  She was seen by her PCP yesterday who prescribed her an antidiarrheal, however that has not helped.  She has already had at least 3 bowel movements this morning.  No recent travel.     The history is provided by the patient and the spouse.    Past Medical History:  Diagnosis Date  . Atrial fibrillation ([place])   . Bloating   . Complication of anesthesia     " hard to wake up sometimes "  . Dementia ([place])   . Diverticula of colon   . Epigastric burning sensation   .  GERD (gastroesophageal reflux disease)   . Headache disorder 06/03/2014  . Headache(784.0)   . Helicobacter pylori gastritis 2004  . Hemorrhoids 06/26/03   Dr. Laural Golden tcs  . Hiatal hernia 06/26/03   Dr. Laural Golden egd  . Hypertension   . Hypothyroidism   . 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 Memory difficulty 06/03/2014  . Shoulder fracture, left   . Spastic colon   . Wrist fracture     Patient Active Problem List   Diagnosis Date Noted  . Syncope 10/26/2018  . C. difficile enteritis 02/22/2018  . Sepsis ([place]) 02/21/2018  . Leukocytosis 02/21/2018  . Vomiting and diarrhea 02/21/2018  . Dementia without behavioral disturbance ([place]) 02/21/2018  . Hypothyroidism 02/21/2018  . Diarrhea 02/21/2018  . Wrist fracture 06/26/2016  . Humeral fracture 06/26/2016  . Encounter for screening colonoscopy 12/31/2015  . Memory difficulty 06/03/2014  . Headache disorder 06/03/2014  . Atrial flutter ([place]) 05/09/2013  . COPD (chronic obstructive pulmonary disease) ([place]) 05/09/2013  . Tobacco abuse 05/08/2013  . LBBB (left bundle branch block)- intermittent 05/08/2013  . Chronic diarrhea 05/11/2011  . Chronic nausea 05/11/2011  . Dyspepsia 02/24/2011  . IBS (irritable bowel syndrome) 02/24/2011  . Chest pain 12/05/2010  . Hypertension 12/05/2010    Past Surgical History:  Procedure Laterality Date  . APPENDECTOMY    .  CHOLECYSTECTOMY    . COLONOSCOPY  06/26/03   Dr. Kem Boroughs diverticula at the sigmoid colon with one of the transverse colon, normal terminal ileoscopy, small external hemorrhoids the  . COLONOSCOPY N/A 01/20/2016   Procedure: COLONOSCOPY;  Surgeon: Daneil Dolin, MD;  Location[facility];  Service: Endoscopy;  Laterality: N/A;  2:30 pm  . ESOPHAGOGASTRODUODENOSCOPY  03/15/2011   Dr. Trevor Iha hiatal hernia, abnormal gastric mucosa of unclear significance. Gastric biopsies negative, no H. pylori.Procedure: ESOPHAGOGASTRODUODENOSCOPY (EGD);   Surgeon: Daneil Dolin, MD;  Location: [facility];  Service: Endoscopy;  Laterality: N/A;  7:30  . ORIF HUMERUS FRACTURE Left 05/29/2016   Procedure: LEFT OPEN REDUCTION INTERNAL FIXATION (ORIF) PROXIMAL HUMERUS FRACTURE;  Surgeon: Meredith Pel, MD;  Location: [place];  Service: Orthopedics;  Laterality: Left;  . ORIF WRIST FRACTURE Left 05/29/2016   Procedure: OPEN REDUCTION INTERNAL FIXATION (ORIF) LEFT WRIST FRACTURE;  Surgeon: Meredith Pel, MD;  Location: [place];  Service: Orthopedics;  Laterality: Left;  . TUBAL LIGATION    . UPPER GASTROINTESTINAL ENDOSCOPY  06/26/03   Dr. Wynelle Bourgeois sliding hiatal hernia with 8 mm tongue of gastric type mucosa at distal esophagus bile in the stomach.     OB History   No obstetric history on file.      Home Medications    Prior to Admission medications   Medication Sig Start Date End Date Taking? Authorizing Provider  aspirin 81 MG tablet Take 81 mg by mouth daily.   Yes [provider]  calcium carbonate (CALTRATE 600) 1500 (600 Ca) MG TABS tablet Take 600 mg of elemental calcium by mouth daily. Plus D3   Yes [provider]  Cyanocobalamin (B-12 PO) Take 6,000 mcg by mouth daily.   Yes [provider]  diphenoxylate-atropine (LOMOTIL) 2.5-0.025 MG tablet Take 1 tablet by mouth every 4 (four) hours as needed. 10/25/18  Yes [provider]  donepezil (ARICEPT) 10 MG tablet Take 1 tablet (10 mg total) by mouth at bedtime. 09/02/18  Yes Ward Givens, NP  escitalopram (LEXAPRO) 10 MG tablet Take 10 mg by mouth every evening.  10/16/14  Yes [provider]  memantine (NAMENDA) 10 MG tablet Take 1 tablet (10 mg total) by mouth 2 (two) times daily. 09/02/18  Yes Ward Givens, NP  OVER THE COUNTER MEDICATION Take 1 tablet by mouth daily. cognisense otc supplement   Yes [provider]  SYNTHROID 88 MCG tablet Take 88 mcg by mouth daily. Except on friday 12/15/17  Yes [provider]  alendronate (FOSAMAX) 70 MG tablet Take 70 mg by mouth once a week.  07/27/18   [provider]    Family History Family History  Problem Relation Age of Onset  . Hypertension Mother   . Angina Mother   . COPD Father   . Heart failure Father   . Cancer Sister   . Dementia Sister   . Cardiomyopathy Brother   . Pneumonia Other   . Migraines Sister   . Colon cancer Neg Hx     Social History Social History   Tobacco Use  . Smoking status: Current Every Day Smoker    Packs/day: 1.00    Years: 45.00    Pack years: 45.00    Types: Cigarettes    Start date: 09/10/1966  . Smokeless tobacco: Never Used  . Tobacco comment: 12/21/16 1 PPD  Substance Use Topics  . Alcohol use: No    Alcohol/week: 0.0 standard drinks  .  Drug use: No     Allergies   Penicillins, Benadryl [diphenhydramine hcl], Msm [methylsulfonylmethane], Nubain [nalbuphine hcl], Sulfa antibiotics, and Codeine   Review of Systems Review of Systems  Constitutional: Negative for fever.  Eyes: Negative for visual disturbance.  Respiratory: Negative for shortness of breath.   Cardiovascular: Negative for chest pain and palpitations.  Gastrointestinal: Positive for diarrhea. Negative for abdominal pain, blood in stool, constipation, nausea and vomiting.  Genitourinary: Negative for dysuria and frequency.  Neurological: Positive for syncope. Negative for headaches.  All other systems reviewed and are negative.    Physical Exam Updated Vital Signs BP 132/84 (BP Location: Left Arm)   Pulse 70   Temp (!) 97.1 F (36.2 C) (Oral)   Resp 16   Ht 5\' 2"  (1.575 m)   Wt 51.4 kg   SpO2 97%   BMI 20.73 kg/m   Physical Exam Vitals signs and nursing note reviewed.  Constitutional:      General: She is not in acute distress.    Appearance: She is well-developed. She is not ill-appearing.  HENT:     Head: Normocephalic and atraumatic.  Eyes:     Extraocular Movements: Extraocular movements intact.      Conjunctiva/sclera: Conjunctivae normal.     Pupils: Pupils are equal, round, and reactive to light.  Cardiovascular:     Rate and Rhythm: Normal rate and regular rhythm.  Pulmonary:     Effort: Pulmonary effort is normal. No respiratory distress.     Breath sounds: Normal breath sounds.  Abdominal:     General: Bowel sounds are normal. There is no distension.     Palpations: Abdomen is soft.     Tenderness: There is no abdominal tenderness. There is no guarding or rebound.  Skin:    General: Skin is warm.  Neurological:     Mental Status: She is alert and oriented to person, place, and time.  Psychiatric:        Behavior: Behavior normal.      ED Treatments / Results  Labs (all labs ordered are listed, but only abnormal results are displayed) Labs Reviewed  CBC WITH DIFFERENTIAL/PLATELET - Abnormal; Notable for the following components:      Result Value   Platelets 406 (*)    Monocytes Absolute 1.1 (*)    All other components within normal limits  COMPREHENSIVE METABOLIC PANEL - Abnormal; Notable for the following components:   Potassium 2.5 (*)    Glucose, Bld 106 (*)    Creatinine, Ser 1.67 (*)    Calcium 8.6 (*)    GFR calc non Af Amer 32 (*)    GFR calc Af Amer 37 (*)    All other components within normal limits  URINALYSIS, ROUTINE W REFLEX MICROSCOPIC - Abnormal; Notable for the following components:   APPearance HAZY (*)    All other components within normal limits  SARS CORONAVIRUS 2 (HOSPITAL ORDER, [facility])  C DIFFICILE QUICK SCREEN W PCR REFLEX  GASTROINTESTINAL PANEL BY PCR, STOOL (REPLACES STOOL CULTURE)  URINE CULTURE  C DIFFICILE QUICK SCREEN W PCR REFLEX  GASTROINTESTINAL PANEL BY PCR, STOOL (REPLACES STOOL CULTURE)  MAGNESIUM  BASIC METABOLIC PANEL  CBC  MAGNESIUM  TROPONIN I (HIGH SENSITIVITY)  TROPONIN I (HIGH SENSITIVITY)    EKG EKG Interpretation  Date/Time:  Saturday October 26 2018 08:55:00 EDT  Ventricular Rate:  73 PR Interval:    QRS Duration: 144 QT Interval:  494 QTC Calculation: 537 R  Axis:   -9 Text Interpretation:  Sinus rhythm Ventricular bigeminy Left bundle branch block When compared with ECG of 02/20/2018 Rate slower Confirmed by Francine Graven [phone]) on 10/26/2018 9:13:55 AM   Radiology No results found.  Procedures .Critical Care Performed by: Romonda Parker, [place] N, PA-C Authorized by: Ledora Delker, [place] N, PA-C   Critical care provider statement:    Critical care time (minutes):  30   Critical care time was exclusive of:  Separately billable procedures and treating other patients and teaching time   Critical care was necessary to treat or prevent imminent or life-threatening deterioration of the following conditions:  Metabolic crisis and circulatory failure   Critical care was time spent personally by me on the following activities:  Discussions with consultants, evaluation of patient's response to treatment, examination of patient, ordering and performing treatments and interventions, ordering and review of laboratory studies, ordering and review of radiographic studies, pulse oximetry, re-evaluation of patient's condition, obtaining history from patient or surrogate and review of old charts   I assumed direction of critical care for this patient from another provider in my specialty: no     (including critical care time)  Medications Ordered in ED Medications  aspirin EC tablet 81 mg (has no administration in time range)  donepezil (ARICEPT) tablet 10 mg (has no administration in time range)  escitalopram (LEXAPRO) tablet 10 mg (has no administration in time range)  memantine (NAMENDA) tablet 10 mg (has no administration in time range)  vitamin B-12 (CYANOCOBALAMIN) tablet 6,000 mcg (has no administration in time range)  levothyroxine (SYNTHROID) tablet 88 mcg (88 mcg Oral Given 10/26/18 1553)  potassium chloride SA (K-DUR) CR tablet 20 mEq (has no  administration in time range)  heparin injection 5,000 Units (5,000 Units Subcutaneous Given 10/26/18 1552)  acetaminophen (TYLENOL) tablet 650 mg (has no administration in time range)    Or  acetaminophen (TYLENOL) suppository 650 mg (has no administration in time range)  ondansetron (ZOFRAN) tablet 4 mg (has no administration in time range)    Or  ondansetron (ZOFRAN) injection 4 mg (has no administration in time range)  0.9 % NaCl with KCl 40 mEq / L  infusion (75 mL/hr Intravenous New Bag/Given 10/26/18 1557)  calcium carbonate (TUMS - dosed in mg elemental calcium) chewable tablet 200 mg of elemental calcium (200 mg of elemental calcium Oral Given 10/26/18 1553)  sodium chloride 0.9 % bolus 1,000 mL ( Intravenous Stopped 10/26/18 1027)  potassium chloride 10 mEq in 100 mL IVPB (0 mEq Intravenous Stopped 10/26/18 1350)  magnesium sulfate IVPB 2 g 50 mL (2 g Intravenous New Bag/Given 10/26/18 1351)  sodium chloride 0.9 % bolus 1,000 mL (0 mLs Intravenous Stopped 10/26/18 1236)  potassium chloride SA (K-DUR) CR tablet 40 mEq (40 mEq Oral Given 10/26/18 1243)     Initial Impression / Assessment and Plan / ED Course  I have reviewed the triage vital signs and the nursing notes.  Pertinent labs & imaging results that were available during my care of the patient were reviewed by me and considered in my medical decision making (see chart for details).        Patient presenting with 1 week of diarrhea and a syncopal episode that occurred today.  Her prodrome was feeling overheated though denies chest pain, shortness of breath, abdominal pain, fever.  She denies any complaints in the ED.  Patient is noted to be hypotensive on arrival though alert and conversant.  Even with patient's relatively recent history of  C. difficile in January, ordered C. difficile and GI panel.  IV fluids initiated.  Labs.  Patient's labs with significant hypokalemia of 2.5.  She also has an AK I with creatinine of 1.7.  IV  potassium replacement and magnesium replacement ordered.  Given patient's syncopal episode in the setting of hypotension, diarrhea, and electrolyte derangements as well as AKI, recommend admission for further management.  Initially hesitant due to visitor policy with her husband, however discussed importance of admission and patient agrees at this time.  Hospitalist consulted for admission.  The patient appears reasonably stabilized for admission considering the current resources, flow, and capabilities available in the ED at this time, and I doubt any other [facility] requiring further screening and/or treatment in the ED prior to admission.   Final Clinical Impressions(s) / ED Diagnoses   Final diagnoses:  AKI (acute kidney injury) ([place])  Dehydration  Syncope and collapse  Hypokalemia    ED Discharge Orders    None       Malcolm Quast, [place] N, PA-C 10/26/18 1724    Francine Graven, DO 10/29/18 1103

## 2018-10-26 NOTE — ED Triage Notes (Signed)
Pt brought in by her husband who went to park car at time of this note. Pt states they went to breakfast and she did not get to eat but is not sure why. Pt unable to answer most questions but states she feels fine.

## 2018-10-26 NOTE — Progress Notes (Signed)
Pt's husband states that pt had 3 loose stools this am prior to them leaving the house. He gave pt two Lomotil tablets before leaving the house, then 35 - 45 minutes later, pt had syncopal episode at restaurant.

## 2018-10-26 NOTE — Progress Notes (Signed)
Pt's husband has left to go home to get his meds and food, and will return to stay the night with patient. Patient remains confused, only oriented to person and place. Does not remember birthday, current date or when her deceased child was either born or died. Pt keeps saying, "I'm ready to go home now. Why am I here? I don't remember what happened."  Pt requires constant reminding and reorienting.

## 2018-10-26 NOTE — H&P (Signed)
History and Physical  PAW CARBINE [id number] DOB: 1950/05/13 DOA: 10/26/2018   PCP: Sharilyn Sites, MD   Patient coming from: Home  Chief Complaint: syncope and diarrhea  HPI:  Melanie Bradley is a 68 y.o. female with medical history of dementia, COPD, hypertension, IBS, remote atrial flutter presenting with a syncopal episode.  The patient and her husband were at a [employer].  They were standing in line waiting for table.  They had been standing up for approximately 5 minutes when the patient complained of feeling hot.  Shortly thereafter, the patient had a syncopal episode lasting approximately 30 seconds.  When the patient woke up, she was back to her usual baseline.  There was no tonic-clonic activity, bowel or bladder incontinence, or tongue biting.  Husband states that the patient has similar episode when she had C. difficile in January 2020.  Because of the patient's dementia, much of this history is supplemented by the patient's spouse at the bedside.  He states that she has had diarrhea for the better part of a week having up to 10 episodes per day.  There is been no hematochezia, melena, fevers, chills, abdominal pain.  She has not been started any new medications or antibiotics.  There is not been any recent travels or eating any undercooked or exotic foods.  There is been no sick contacts.  They went to see their primary care provider on 10/25/2018 and the patient was given a prescription for Lomotil.  They have been using Imodium at home.  The Lomotil did not help.  Regarding the syncopal episode, there was no prodromal symptoms including aura, chest discomfort, shortness of breath, palpitations. In the emergency department, the patient was afebrile.  The patient initially was mildly hypotensive with a blood pressure of 85/60.  This improved with fluid resuscitation.  saturating 99% on room air.  BMP showed a potassium of 2.5 with serum creatinine 1.67.  LFTs were  unremarkable.  Magnesium 1.7.  CBC was unremarkable.  EKG showed sinus rhythm with nonspecific T wave changes.  Assessment/Plan: Syncope -Likely secondary to volume depletion/vasovagal -Check orthostatic vital signs -Remain on telemetry -Echocardiogram  Diarrhea -Check C. Difficile -Stool pathogen panel  Hypokalemia -Secondary to GI losses -Replete -Magnesium 1.7  Hypothyroidism -Continue Synthroid -Check TSH  Dementia without behavioral disturbance -Continue Aricept and Namenda       Past Medical History:  Diagnosis Date  . Atrial fibrillation ([place])   . Bloating   . Complication of anesthesia     " hard to wake up sometimes "  . Dementia ([place])   . Diverticula of colon   . Epigastric burning sensation   . GERD (gastroesophageal reflux disease)   . Headache disorder 06/03/2014  . Headache(784.0)   . Helicobacter pylori gastritis 2004  . Hemorrhoids 06/26/03   Dr. Laural Golden [facility]  . Hiatal hernia 06/26/03   Dr. Laural Golden egd  . Hypertension   . Hypothyroidism   . 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 Memory difficulty 06/03/2014  . Shoulder fracture, left   . Spastic colon   . Wrist fracture    Past Surgical History:  Procedure Laterality Date  . APPENDECTOMY    . CHOLECYSTECTOMY    . COLONOSCOPY  06/26/03   Dr. Kem Boroughs diverticula at the sigmoid colon with one of the transverse colon, normal terminal ileoscopy, small external hemorrhoids the  . COLONOSCOPY N/A 01/20/2016   Procedure: COLONOSCOPY;  Surgeon:  Daneil Dolin, MD;  [facility];  Service: Endoscopy;  Laterality: N/A;  2:30 pm  . ESOPHAGOGASTRODUODENOSCOPY  03/15/2011   Dr. Trevor Iha hiatal hernia, abnormal gastric mucosa of unclear significance. Gastric biopsies negative, no H. pylori.Procedure: ESOPHAGOGASTRODUODENOSCOPY (EGD);  Surgeon: Daneil Dolin, MD;  [facility];  Service: Endoscopy;  Laterality: N/A;  7:30  . ORIF HUMERUS FRACTURE Left  05/29/2016   Procedure: LEFT OPEN REDUCTION INTERNAL FIXATION (ORIF) PROXIMAL HUMERUS FRACTURE;  Surgeon: Meredith Pel, MD;  Location: [facility];  Service: Orthopedics;  Laterality: Left;  . ORIF WRIST FRACTURE Left 05/29/2016   Procedure: OPEN REDUCTION INTERNAL FIXATION (ORIF) LEFT WRIST FRACTURE;  Surgeon: Meredith Pel, MD;  Location: [facility];  Service: Orthopedics;  Laterality: Left;  . TUBAL LIGATION    . UPPER GASTROINTESTINAL ENDOSCOPY  06/26/03   Dr. Wynelle Bourgeois sliding hiatal hernia with 8 mm tongue of gastric type mucosa at distal esophagus bile in the stomach.   Social History:  reports that she has been smoking cigarettes. She started smoking about 52 years ago. She has a 45.00 pack-year smoking history. She has never used smokeless tobacco. She reports that she does not drink alcohol or use drugs.   Family History  Problem Relation Age of Onset  . Hypertension Mother   . Angina Mother   . COPD Father   . Heart failure Father   . Cancer Sister   . Dementia Sister   . Cardiomyopathy Brother   . Pneumonia Other   . Migraines Sister   . Colon cancer Neg Hx      Allergies  Allergen Reactions  . Penicillins Hives and Swelling    Has patient had a PCN reaction causing IMMEDIATE RASH, FACIAL/TONGUE/THROAT SWELLING, SOB, OR LIGHTHEADEDNESS WITH HYPOTENSION:  #  #  #  YES  #  #  #  Has patient had a PCN reaction causing severe rash involving mucus membranes or skin necrosis: No Has patient had a PCN reaction that required hospitalization No Has patient had a PCN reaction occurring within the last 10 years: No If all of the above answers are "NO", then may proceed with Cephalosporin use.   . Benadryl [Diphenhydramine Hcl] Hives  . Msm [Methylsulfonylmethane] Hives  . Nubain [Nalbuphine Hcl] Hives  . Sulfa Antibiotics Hives  . Codeine     UNSPECIFIED REACTION [IF AT ALL] REFUSES TO TAKE     Prior to Admission medications   Medication Sig Start Date End Date  Taking? Authorizing Provider  alendronate (FOSAMAX) 70 MG tablet  07/27/18   [provider]  aspirin 81 MG tablet Take 81 mg by mouth daily.    [provider]  calcium carbonate (CALTRATE 600) 1500 (600 Ca) MG TABS tablet Take 600 mg of elemental calcium by mouth daily. Plus D3    [provider]  Cyanocobalamin (B-12 PO) Take 6,000 mcg by mouth daily.    [provider]  diphenoxylate-atropine (LOMOTIL) 2.5-0.025 MG tablet Take 1 tablet by mouth every 4 (four) hours as needed. 10/25/18   [provider]  donepezil (ARICEPT) 10 MG tablet Take 1 tablet (10 mg total) by mouth at bedtime. 09/02/18   Ward Givens, NP  escitalopram (LEXAPRO) 10 MG tablet Take 10 mg by mouth every evening.  10/16/14   [provider]  memantine (NAMENDA) 10 MG tablet Take 1 tablet (10 mg total) by mouth 2 (two) times daily. 09/02/18   Ward Givens, NP  OVER THE COUNTER MEDICATION  Take 1 tablet by mouth daily. cognisense otc supplement    [provider]  potassium chloride SA (K-DUR,KLOR-CON) 20 MEQ tablet Take 1 tablet (20 mEq total) by mouth daily. 02/22/18   Kathie Dike, MD  SYNTHROID 88 MCG tablet Take 88 mcg by mouth daily. Except on friday 12/15/17   [provider]    Review of Systems:  Constitutional:  No weight loss, night sweats, Fevers, chills, fatigue.  Head&Eyes: No headache.  No vision loss.  No eye pain or scotoma ENT:  No Difficulty swallowing,Tooth/dental problems,Sore throat,  No ear ache, post nasal drip,  Cardio-vascular:  No chest pain, Orthopnea, PND, swelling in lower extremities,  dizziness, palpitations  GI:  No  abdominal pain, nausea, vomiting,loss of appetite, hematochezia, melena, heartburn, indigestion, Resp:  No shortness of breath with exertion or at rest. No cough. No coughing up of blood .No wheezing.No chest wall deformity  Skin:  no rash or lesions.  GU:  no dysuria, change in color of urine, no  urgency or frequency. No flank pain.  Musculoskeletal:  No joint pain or swelling. No decreased range of motion. No back pain.  Psych:  No change in mood or affect. No depression or anxiety. Neurologic: No headache, no dysesthesia, no focal weakness, no vision loss. No syncope  Physical Exam: Vitals:   10/26/18 1130 10/26/18 1200 10/26/18 1230 10/26/18 1300  BP: 119/82 120/82 119/87 116/87  Pulse: 72   69  Resp: (!) 21 17 16 15   Temp:      TempSrc:      SpO2: 98%   96%  Weight:      Height:       General:  A&O x 3, NAD, nontoxic, pleasant/cooperative Head/Eye: No conjunctival hemorrhage, no icterus, [place]/AT, No nystagmus ENT:  No icterus,  No thrush, good dentition, no pharyngeal exudate Neck:  No masses, no lymphadenpathy, no bruits CV:  RRR, no rub, no gallop, no S3 Lung:  CTAB, good air movement, no wheeze, no rhonchi Abdomen: soft/NT, +BS, nondistended, no peritoneal signs Ext: No cyanosis, No rashes, No petechiae, No lymphangitis, No edema Neuro: CNII-XII intact, strength 4/5 in bilateral upper and lower extremities, no dysmetria  Labs on Admission:  Basic Metabolic Panel: Recent Labs  Lab 10/26/18 0927  NA 135  K 2.5*  CL 106  CO2 22  GLUCOSE 106*  BUN 20  CREATININE 1.67*  CALCIUM 8.6*  MG 1.7   Liver Function Tests: Recent Labs  Lab 10/26/18 0927  AST 21  ALT 21  ALKPHOS 93  BILITOT 0.3  PROT 7.7  ALBUMIN 3.9   No results for input(s): LIPASE, AMYLASE in the last 168 hours. No results for input(s): AMMONIA in the last 168 hours. CBC: Recent Labs  Lab 10/26/18 0927  WBC 8.2  NEUTROABS 5.2  HGB 12.9  HCT 39.7  MCV 95.2  PLT 406*   Coagulation Profile: No results for input(s): INR, PROTIME in the last 168 hours. Cardiac Enzymes: No results for input(s): CKTOTAL, CKMB, CKMBINDEX, TROPONINI in the last 168 hours. BNP: Invalid input(s): POCBNP CBG: No results for input(s): GLUCAP in the last 168 hours. Urine analysis:    Component Value  Date/Time   COLORURINE YELLOW 10/26/2018 0920   APPEARANCEUR HAZY (A) 10/26/2018 0920   LABSPEC 1.013 10/26/2018 0920   PHURINE 5.0 10/26/2018 0920   GLUCOSEU NEGATIVE 10/26/2018 0920   HGBUR NEGATIVE 10/26/2018 0920   BILIRUBINUR NEGATIVE 10/26/2018 0920   KETONESUR NEGATIVE 10/26/2018 0920   PROTEINUR NEGATIVE  10/26/2018 0920   NITRITE NEGATIVE 10/26/2018 0920   LEUKOCYTESUR NEGATIVE 10/26/2018 0920   Sepsis Labs: @LABRCNTIP (procalcitonin:4,lacticidven:4) )No results found for this or any previous visit (from the past 240 hour(s)).   Radiological Exams on Admission: No results found.  EKG: Independently reviewed. Sinus, TWI V1-V3    Time spent:60 minutes Code Status:   FULL Family Communication:  Spouse update at bedside 9/12 Disposition Plan: expect 1-2 day hospitalization Consults called: none DVT Prophylaxis: [place] Heparin  Orson Eva, DO  [facility] Pager [phone]  If 7PM-7AM, please contact night-coverage [email or web address] Password [email or web address] 10/26/2018, 1:05 PM

## 2018-10-26 NOTE — Progress Notes (Signed)
Husband and patient are refusing both flu and pneumonia vaccines at this time. State pt had bad reaction to vaccines in past and would rather wait and discuss with PMD.

## 2018-10-27 ENCOUNTER — Observation Stay (HOSPITAL_COMMUNITY): Payer: Medicare HMO

## 2018-10-27 DIAGNOSIS — E039 Hypothyroidism, unspecified: Secondary | ICD-10-CM | POA: Diagnosis present

## 2018-10-27 DIAGNOSIS — R55 Syncope and collapse: Secondary | ICD-10-CM

## 2018-10-27 DIAGNOSIS — F17210 Nicotine dependence, cigarettes, uncomplicated: Secondary | ICD-10-CM | POA: Diagnosis present

## 2018-10-27 DIAGNOSIS — N179 Acute kidney failure, unspecified: Secondary | ICD-10-CM

## 2018-10-27 DIAGNOSIS — Z20828 Contact with and (suspected) exposure to other viral communicable diseases: Secondary | ICD-10-CM | POA: Diagnosis present

## 2018-10-27 DIAGNOSIS — Z7983 Long term (current) use of bisphosphonates: Secondary | ICD-10-CM | POA: Diagnosis not present

## 2018-10-27 DIAGNOSIS — I428 Other cardiomyopathies: Secondary | ICD-10-CM | POA: Diagnosis not present

## 2018-10-27 DIAGNOSIS — Z7982 Long term (current) use of aspirin: Secondary | ICD-10-CM | POA: Diagnosis not present

## 2018-10-27 DIAGNOSIS — Z825 Family history of asthma and other chronic lower respiratory diseases: Secondary | ICD-10-CM | POA: Diagnosis not present

## 2018-10-27 DIAGNOSIS — E876 Hypokalemia: Secondary | ICD-10-CM | POA: Diagnosis present

## 2018-10-27 DIAGNOSIS — F0390 Unspecified dementia without behavioral disturbance: Secondary | ICD-10-CM | POA: Diagnosis present

## 2018-10-27 DIAGNOSIS — Z720 Tobacco use: Secondary | ICD-10-CM | POA: Diagnosis not present

## 2018-10-27 DIAGNOSIS — E860 Dehydration: Secondary | ICD-10-CM

## 2018-10-27 DIAGNOSIS — J449 Chronic obstructive pulmonary disease, unspecified: Secondary | ICD-10-CM | POA: Diagnosis present

## 2018-10-27 DIAGNOSIS — I340 Nonrheumatic mitral (valve) insufficiency: Secondary | ICD-10-CM | POA: Diagnosis not present

## 2018-10-27 DIAGNOSIS — R197 Diarrhea, unspecified: Secondary | ICD-10-CM

## 2018-10-27 DIAGNOSIS — Z79899 Other long term (current) drug therapy: Secondary | ICD-10-CM | POA: Diagnosis not present

## 2018-10-27 DIAGNOSIS — Z885 Allergy status to narcotic agent status: Secondary | ICD-10-CM | POA: Diagnosis not present

## 2018-10-27 DIAGNOSIS — Z888 Allergy status to other drugs, medicaments and biological substances status: Secondary | ICD-10-CM | POA: Diagnosis not present

## 2018-10-27 DIAGNOSIS — K589 Irritable bowel syndrome without diarrhea: Secondary | ICD-10-CM | POA: Diagnosis present

## 2018-10-27 DIAGNOSIS — I10 Essential (primary) hypertension: Secondary | ICD-10-CM | POA: Diagnosis present

## 2018-10-27 DIAGNOSIS — I959 Hypotension, unspecified: Secondary | ICD-10-CM | POA: Diagnosis present

## 2018-10-27 DIAGNOSIS — Z882 Allergy status to sulfonamides status: Secondary | ICD-10-CM | POA: Diagnosis not present

## 2018-10-27 DIAGNOSIS — Z79890 Hormone replacement therapy (postmenopausal): Secondary | ICD-10-CM | POA: Diagnosis not present

## 2018-10-27 DIAGNOSIS — K219 Gastro-esophageal reflux disease without esophagitis: Secondary | ICD-10-CM | POA: Diagnosis present

## 2018-10-27 DIAGNOSIS — I429 Cardiomyopathy, unspecified: Secondary | ICD-10-CM | POA: Diagnosis present

## 2018-10-27 DIAGNOSIS — Z880 Allergy status to penicillin: Secondary | ICD-10-CM | POA: Diagnosis not present

## 2018-10-27 LAB — CBC
HCT: 35.2 % — ABNORMAL LOW (ref 36.0–46.0)
Hemoglobin: 11.3 g/dL — ABNORMAL LOW (ref 12.0–15.0)
MCH: 31.1 pg (ref 26.0–34.0)
MCHC: 32.1 g/dL (ref 30.0–36.0)
MCV: 97 fL (ref 80.0–100.0)
Platelets: 332 10*3/uL (ref 150–400)
RBC: 3.63 MIL/uL — ABNORMAL LOW (ref 3.87–5.11)
RDW: 13.2 % (ref 11.5–15.5)
WBC: 8.8 10*3/uL (ref 4.0–10.5)
nRBC: 0 % (ref 0.0–0.2)

## 2018-10-27 LAB — C DIFFICILE QUICK SCREEN W PCR REFLEX
C Diff antigen: NEGATIVE
C Diff interpretation: NOT DETECTED
C Diff toxin: NEGATIVE

## 2018-10-27 LAB — BASIC METABOLIC PANEL
Anion gap: 3 — ABNORMAL LOW (ref 5–15)
BUN: 12 mg/dL (ref 8–23)
CO2: 19 mmol/L — ABNORMAL LOW (ref 22–32)
Calcium: 8.1 mg/dL — ABNORMAL LOW (ref 8.9–10.3)
Chloride: 117 mmol/L — ABNORMAL HIGH (ref 98–111)
Creatinine, Ser: 1.26 mg/dL — ABNORMAL HIGH (ref 0.44–1.00)
GFR calc Af Amer: 51 mL/min — ABNORMAL LOW (ref >60)
GFR calc non Af Amer: 44 mL/min — ABNORMAL LOW (ref >60)
Glucose, Bld: 98 mg/dL (ref 70–99)
Potassium: 4.3 mmol/L (ref 3.5–5.1)
Sodium: 139 mmol/L (ref 135–145)

## 2018-10-27 LAB — MAGNESIUM: Magnesium: 2.1 mg/dL (ref 1.7–2.4)

## 2018-10-27 MED ORDER — METRONIDAZOLE 500 MG PO TABS
250.0000 mg | ORAL_TABLET | Freq: Three times a day (TID) | ORAL | Status: DC
  Administered 2018-10-27 – 2018-10-29 (×6): 250 mg via ORAL
  Filled 2018-10-27 (×6): qty 1

## 2018-10-27 MED ORDER — CIPROFLOXACIN HCL 250 MG PO TABS
500.0000 mg | ORAL_TABLET | Freq: Two times a day (BID) | ORAL | Status: DC
  Administered 2018-10-27 – 2018-10-29 (×4): 500 mg via ORAL
  Filled 2018-10-27 (×4): qty 2

## 2018-10-27 MED ORDER — LOPERAMIDE HCL 2 MG PO CAPS
2.0000 mg | ORAL_CAPSULE | Freq: Two times a day (BID) | ORAL | Status: AC
  Administered 2018-10-27 (×2): 2 mg via ORAL
  Filled 2018-10-27 (×2): qty 1

## 2018-10-27 NOTE — Plan of Care (Signed)
  Problem: Education: Goal: Knowledge of General Education information will improve Description Including pain rating scale, medication(s)/side effects and non-pharmacologic comfort measures Outcome: Progressing   Problem: Health Behavior/Discharge Planning: Goal: Ability to manage health-related needs will improve Outcome: Progressing   

## 2018-10-27 NOTE — Progress Notes (Signed)
  Echocardiogram 2D Echocardiogram has been performed.  Melanie Bradley M 10/27/2018, 1:21 PM

## 2018-10-27 NOTE — Progress Notes (Signed)
Paged Dr. Laural Golden to let him know that I collected the stool sample.

## 2018-10-27 NOTE — Progress Notes (Signed)
PROGRESS NOTE  Melanie Bradley [id number] DOB: 1950-09-12 DOA: 10/26/2018 PCP: Sharilyn Sites, MD  Brief History:  68 y.o. female with medical history of dementia, COPD, hypertension, IBS, remote atrial flutter presenting with a syncopal episode.  The patient and her husband were at a [employer].  They were standing in line waiting for table.  They had been standing up for approximately 5 minutes when the patient complained of feeling hot.  Shortly thereafter, the patient had a syncopal episode lasting approximately 30 seconds.  When the patient woke up, she was back to her usual baseline.  There was no tonic-clonic activity, bowel or bladder incontinence, or tongue biting.  Husband states that the patient has similar episode when she had C. difficile in January 2020.  Because of the patient's dementia, much of this history is supplemented by the patient's spouse at the bedside.  He states that she has had diarrhea for the better part of a week having up to 10 episodes per day.  There is been no hematochezia, melena, fevers, chills, abdominal pain.  She has not been started any new medications or antibiotics.  There is not been any recent travels or eating any undercooked or exotic foods.  There is been no sick contacts.  They went to see their primary care provider on 10/25/2018 and the patient was given a prescription for Lomotil.  They have been using Imodium at home.  The Lomotil did not help.  Regarding the syncopal episode, there was no prodromal symptoms including aura, chest discomfort, shortness of breath, palpitations. In the emergency department, the patient was afebrile.  The patient initially was mildly hypotensive with a blood pressure of 85/60.  This improved with fluid resuscitation.  saturating 99% on room air.  BMP showed a potassium of 2.5 with serum creatinine 1.67.  LFTs were unremarkable.  Magnesium 1.7.  CBC was unremarkable.  EKG showed sinus rhythm with nonspecific  T wave changes.  Assessment/Plan: Syncope -secondary to volume depletion/vasovagal -Remain on telemetry--no concerning dysrhythmia -Echocardiogram--pending  Diarrhea -pt continues to have at least 7 BMs in last 23 hours -Check C. Difficile -Stool pathogen panel -pt continues with difficulty controlling BMs due to fecal urgency and frequency -given amount of volume loss, she is at high risk for volume depletion and recurrent renal failure -GI consult--discussed with Dr. Laural Golden  AKI -baseline creatining 0.8-0.9 -presented with serum creatinine 1.67 -due to volume depletion -continue IVF  Hypokalemia -Secondary to GI losses -Replete -Magnesium 1.7  Hypothyroidism -Continue Synthroid -Check TSH  Dementia without behavioral disturbance -Continue Aricept and Namenda  Tobacco abuse I have discussed tobacco cessation with the patient.  I have counseled the patient regarding the negative impacts of continued tobacco use including but not limited to lung cancer, COPD, and cardiovascular disease.  I have discussed alternatives to tobacco and modalities that may help facilitate tobacco cessation including but not limited to biofeedback, hypnosis, and medications.  Total time spent with tobacco counseling was 4 minutes.        Disposition Plan:   Home in 1-2 days  Family Communication:   Spouse updated at bedside 9/13  Consultants:  GI  Code Status:  FULL   DVT Prophylaxis:   Heparin   Procedures: As Listed in Progress Note Above  Antibiotics: None   Total time spent 35 minutes.  Greater than 50% spent face to face counseling and coordinating care.     Subjective: Patient denies fevers,  chills, headache, chest pain, dyspnea, nausea, vomiting, abdominal pain, dysuria, hematuria, hematochezia, and melena.  Pt continues to have diarrhea with 7 BMs.   Objective: Vitals:   10/26/18 1418 10/26/18 1426 10/26/18 2202 10/27/18 0619  BP: 132/87 132/84 125/78  130/84  Pulse: 70  74 75  Resp: 16  15 13   Temp: (!) 97.1 F (36.2 C)  97.7 F (36.5 C) 97.6 F (36.4 C)  TempSrc: Oral  Oral Oral  SpO2: 97%  99% 100%  Weight:      Height:        Intake/Output Summary (Last 24 hours) at 10/27/2018 1259 Last data filed at 10/27/2018 0700 Gross per 24 hour  Intake 1909.03 ml  Output 200 ml  Net 1709.03 ml   Weight change:  Exam:   General:  Pt is alert, follows commands appropriately, not in acute distress  HEENT: No icterus, No thrush, No neck mass, [place]/AT  Cardiovascular: RRR, S1/S2, no rubs, no gallops  Respiratory: CTA bilaterally, no wheezing, no crackles, no rhonchi  Abdomen: Soft/+BS, non tender, non distended, no guarding  Extremities: No edema, No lymphangitis, No petechiae, No rashes, no synovitis   Data Reviewed: I have personally reviewed following labs and imaging studies Basic Metabolic Panel: Recent Labs  Lab 10/26/18 0927 10/27/18 0702  NA 135 139  K 2.5* 4.3  CL 106 117*  CO2 22 19*  GLUCOSE 106* 98  BUN 20 12  CREATININE 1.67* 1.26*  CALCIUM 8.6* 8.1*  MG 1.7 2.1   Liver Function Tests: Recent Labs  Lab 10/26/18 0927  AST 21  ALT 21  ALKPHOS 93  BILITOT 0.3  PROT 7.7  ALBUMIN 3.9   No results for input(s): LIPASE, AMYLASE in the last 168 hours. No results for input(s): AMMONIA in the last 168 hours. Coagulation Profile: No results for input(s): INR, PROTIME in the last 168 hours. CBC: Recent Labs  Lab 10/26/18 0927 10/27/18 0702  WBC 8.2 8.8  NEUTROABS 5.2  --   HGB 12.9 11.3*  HCT 39.7 35.2*  MCV 95.2 97.0  PLT 406* 332   Cardiac Enzymes: No results for input(s): CKTOTAL, CKMB, CKMBINDEX, TROPONINI in the last 168 hours. BNP: Invalid input(s): POCBNP CBG: No results for input(s): GLUCAP in the last 168 hours. HbA1C: No results for input(s): HGBA1C in the last 72 hours. Urine analysis:    Component Value Date/Time   COLORURINE YELLOW 10/26/2018 0920   APPEARANCEUR HAZY (A)  10/26/2018 0920   LABSPEC 1.013 10/26/2018 0920   PHURINE 5.0 10/26/2018 0920   GLUCOSEU NEGATIVE 10/26/2018 0920   HGBUR NEGATIVE 10/26/2018 0920   BILIRUBINUR NEGATIVE 10/26/2018 0920   KETONESUR NEGATIVE 10/26/2018 0920   PROTEINUR NEGATIVE 10/26/2018 0920   NITRITE NEGATIVE 10/26/2018 0920   LEUKOCYTESUR NEGATIVE 10/26/2018 0920   Sepsis Labs: @LABRCNTIP (procalcitonin:4,lacticidven:4) ) Recent Results (from the past 240 hour(s))  SARS Coronavirus 2 [facility] order, Performed in [facility] hospital lab) Nasopharyngeal Nasopharyngeal Swab     Status: None   Collection Time: 10/26/18 10:38 AM   Specimen: Nasopharyngeal Swab  Result Value Ref Range Status   SARS Coronavirus 2 NEGATIVE NEGATIVE Final    Comment: (NOTE) If result is NEGATIVE SARS-CoV-2 target nucleic acids are NOT DETECTED. The SARS-CoV-2 RNA is generally detectable in upper and lower  respiratory specimens during the acute phase of infection. The lowest  concentration of SARS-CoV-2 viral copies this assay can detect is 250  copies / mL. A negative result does not preclude SARS-CoV-2 infection  and  should not be used as the sole basis for treatment or other  patient management decisions.  A negative result may occur with  improper specimen collection / handling, submission of specimen other  than nasopharyngeal swab, presence of viral mutation(s) within the  areas targeted by this assay, and inadequate number of viral copies  (<250 copies / mL). A negative result must be combined with clinical  observations, patient history, and epidemiological information. If result is POSITIVE SARS-CoV-2 target nucleic acids are DETECTED. The SARS-CoV-2 RNA is generally detectable in upper and lower  respiratory specimens dur ing the acute phase of infection.  Positive  results are indicative of active infection with SARS-CoV-2.  Clinical  correlation with patient history and other diagnostic information is  necessary to  determine patient infection status.  Positive results do  not rule out bacterial infection or co-infection with other viruses. If result is PRESUMPTIVE POSTIVE SARS-CoV-2 nucleic acids MAY BE PRESENT.   A presumptive positive result was obtained on the submitted specimen  and confirmed on repeat testing.  While 2019 novel coronavirus  (SARS-CoV-2) nucleic acids may be present in the submitted sample  additional confirmatory testing may be necessary for epidemiological  and / or clinical management purposes  to differentiate between  SARS-CoV-2 and other Sarbecovirus currently known to infect humans.  If clinically indicated additional testing with an alternate test  methodology [phone]) is advised. The SARS-CoV-2 RNA is generally  detectable in upper and lower respiratory sp ecimens during the acute  phase of infection. The expected result is Negative. Fact Sheet for Patients:  StrictlyIdeas.no Fact Sheet for Healthcare Providers: BankingDealers.co.za This test is not yet approved or cleared by the [place] FDA and has been authorized for detection and/or diagnosis of SARS-CoV-2 by FDA under an Emergency Use Authorization (EUA).  This EUA will remain in effect (meaning this test can be used) for the duration of the COVID-19 declaration under Section 564(b)(1) of the Act, 21 U.S.C. section 360bbb-3(b)(1), unless the authorization is terminated or revoked sooner. Performed at [facility], [address] [place], [place] [address]      Scheduled Meds: . aspirin EC  81 mg Oral Daily  . calcium carbonate  200 mg of elemental calcium Oral TID WC  . donepezil  10 mg Oral QHS  . escitalopram  10 mg Oral QPM  . heparin  5,000 Units Subcutaneous Q8H  . levothyroxine  88 mcg Oral Daily  . loperamide  2 mg Oral q12n4p  . memantine  10 mg Oral BID  . nicotine  21 mg Transdermal Daily  . potassium chloride SA  20 mEq Oral Daily  .  vitamin B-12  6,000 mcg Oral Daily   Continuous Infusions: . 0.9 % NaCl with KCl 40 mEq / L 75 mL/hr (10/27/18 0446)    Procedures/Studies: No results found.  Orson Eva, DO  [facility] Pager [phone]  If 7PM-7AM, please contact night-coverage [email or web address] Password [email or web address] 10/27/2018, 12:59 PM   LOS: 0 days

## 2018-10-27 NOTE — Consult Note (Signed)
Referring Provider: Orson Eva, DO Primary Care Physician:  Sharilyn Sites, MD Primary Gastroenterologist:  Dr. Gala Romney  Reason for Consultation:    Diarrhea.  HPI:   Patient is 68 year old Caucasian female with multiple medical problems including dementia who was in usual state of health until 10 days ago when she developed diarrhea within few hours of eating at a [employer].  She had multiple bowel movements each day.  Her husband said that she had urgency and had multiple accidents.  She did not experience abdominal pain melena or rectal bleeding fever or chills.  She did have one episode of vomiting about 5 days ago.  OTC Imodium did not provide any relief.  She also quit drinking fluids and eating her meals.  She was seen at PCPs office 2 days ago.  Prescription for Lomotil was given.  She took a dose that afternoon and then 2 tablets yesterday morning. She went to a [employer] with family members yesterday morning and while standing she passed out.  Her husband was standing next to her and was able to hold her so she did not hit the ground.  Patient was brought to emergency room.  She was noted to be profoundly hypokalemic with serum sodium of 2.5.  Serum creatinine was elevated.  She was felt to be dehydrated.  Patient was admitted to Dr. Carles Collet service and begun on IV fluids.  Her hypokalemia and azotemia has improved.  Stool studies were requested and tools sample has not been obtained yet. Her appetite is better.  She is still having multiple bowel movements.  Her husband says she is had 7 or 8 bowel movements a day but these are small volume and liquid stool. Patient's baseline is 2-3 bowel movements per day which she has done for years.  However she has not had urgency. She did suffer a bout of C. difficile colitis back in January 2020 after she was given 2 courses of antibiotics for bronchitis.  On that admission her white cell count was 30,000.  Her husband states she responded to  first cycle of antibiotic and did not have any relapse.  Patient is retired.  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  All in all she worked 33 years.  She does not drink alcohol but smokes a pack of cigarettes per day.  According to her husband she drives very little around the house.  He says her dementia has remained stable over the last 1 year.  They had 1 child.  She was born with microcephaly and died month before turning 2020/04/30.  Father had prostate cancer but lived to be 18.  Mother in her 44s. One sister died in her late 31s because of metastatic disease.  Primary unknown.  She lost 1 brother due to fallen head injury.  He was 66 years old.  He has 1 brother and 2 sisters living.       Past Medical History:  Diagnosis Date  . Atrial fibrillation ([place])   . Bloating   . Complication of anesthesia     " hard to wake up sometimes "  . Dementia ([place])   . Diverticula of colon       . GERD (gastroesophageal reflux disease)   . Headache disorder 06/03/2014  . Headache(784.0)   . Helicobacter pylori gastritis 2004  . Hemorrhoids 06/26/03   Dr. Laural Golden [facility]  . Hiatal hernia 06/26/03  Dr. Laural Golden [facility]  . Hypertension   .  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   . 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 Memory difficulty 06/03/2014  . Shoulder fracture, left   . Spastic colon   . Wrist fracture     Past Surgical History:  Procedure Laterality Date  . APPENDECTOMY    . CHOLECYSTECTOMY    . COLONOSCOPY  06/26/03   Dr. Kem Boroughs diverticula at the sigmoid colon with one of the transverse colon, normal terminal ileoscopy, small external hemorrhoids the  . COLONOSCOPY N/A 01/20/2016   Procedure: COLONOSCOPY;  Surgeon: Daneil Dolin, MD;  [facility];  Service: Endoscopy;  Laterality: N/A;  2:30 pm  .  ESOPHAGOGASTRODUODENOSCOPY  03/15/2011   Dr. Trevor Iha hiatal hernia, abnormal gastric mucosa of unclear significance. Gastric biopsies negative, no H. pylori.Procedure: ESOPHAGOGASTRODUODENOSCOPY (EGD);  Surgeon: Daneil Dolin, MD;  [facility]: [facility];  Service: Endoscopy;  Laterality: N/A;  7:30  . ORIF HUMERUS FRACTURE Left 05/29/2016   Procedure: LEFT OPEN REDUCTION INTERNAL FIXATION (ORIF) PROXIMAL HUMERUS FRACTURE;  Surgeon: Meredith Pel, MD;  Location: [facility];  Service: Orthopedics;  Laterality: Left;  . ORIF WRIST FRACTURE Left 05/29/2016   Procedure: OPEN REDUCTION INTERNAL FIXATION (ORIF) LEFT WRIST FRACTURE;  Surgeon: Meredith Pel, MD;  Location: [facility];  Service: Orthopedics;  Laterality: Left;  . TUBAL LIGATION    . UPPER GASTROINTESTINAL ENDOSCOPY  06/26/03   Dr. Wynelle Bourgeois sliding hiatal hernia with 8 mm tongue of gastric type mucosa at distal esophagus bile in the stomach.    Prior to Admission medications   Medication Sig Start Date End Date Taking? Authorizing Provider  aspirin 81 MG tablet Take 81 mg by mouth daily.   Yes [provider]  calcium carbonate (CALTRATE 600) 1500 (600 Ca) MG TABS tablet Take 600 mg of elemental calcium by mouth daily. Plus D3   Yes [provider]  Cyanocobalamin (B-12 PO) Take 6,000 mcg by mouth daily.   Yes [provider]  diphenoxylate-atropine (LOMOTIL) 2.5-0.025 MG tablet Take 1 tablet by mouth every 4 (four) hours as needed. 10/25/18  Yes [provider]  donepezil (ARICEPT) 10 MG tablet Take 1 tablet (10 mg total) by mouth at bedtime. 09/02/18  Yes Ward Givens, NP  escitalopram (LEXAPRO) 10 MG tablet Take 10 mg by mouth every evening.  10/16/14  Yes [provider]  memantine (NAMENDA) 10 MG tablet Take 1 tablet (10 mg total) by mouth 2 (two) times daily. 09/02/18  Yes Ward Givens, NP  OVER THE COUNTER MEDICATION Take 1 tablet by mouth daily. cognisense otc supplement    Yes [provider]  SYNTHROID 88 MCG tablet Take 88 mcg by mouth daily. Except on friday 12/15/17  Yes [provider]  alendronate (FOSAMAX) 70 MG tablet Take 70 mg by mouth once a week.  07/27/18   [provider]    Current Facility-Administered Medications  Medication Dose Route Frequency Provider Last Rate Last Dose  . 0.9 % NaCl with KCl 40 mEq / L  infusion   Intravenous Continuous Tat, Shanon Brow, MD 75 mL/hr at 10/27/18 0446 75 mL/hr at 10/27/18 0446  . acetaminophen (TYLENOL) tablet 650 mg  650 mg Oral Q6H PRN Orson Eva, MD   650 mg at 10/26/18 2026   Or  . acetaminophen (TYLENOL) suppository 650 mg  650 mg Rectal Q6H PRN Orson Eva, MD      .  aspirin EC tablet 81 mg  81 mg Oral Daily Tat, David, MD      . calcium carbonate (TUMS - dosed in mg elemental calcium) chewable tablet 200 mg of elemental calcium  200 mg of elemental calcium Oral TID WC Tat, David, MD   200 mg of elemental calcium at 10/26/18 1553  . donepezil (ARICEPT) tablet 10 mg  10 mg Oral Benay Pike, MD   10 mg at 10/26/18 2015  . escitalopram (LEXAPRO) tablet 10 mg  10 mg Oral QPM Tat, Shanon Brow, MD   10 mg at 10/26/18 1827  . haloperidol lactate (HALDOL) injection 2 mg  2 mg Intravenous Q6H PRN Orson Eva, MD   2 mg at 10/26/18 2010  . heparin injection 5,000 Units  5,000 Units Subcutaneous Franco Collet, MD   5,000 Units at 10/27/18 [phone]  . levothyroxine (SYNTHROID) tablet 88 mcg  88 mcg Oral Daily Tat, Shanon Brow, MD   88 mcg at 10/27/18 [id number]  . loperamide (IMODIUM) capsule 2 mg  2 mg Oral q12n4p Rehman, Najeeb U, MD      . memantine [facility]) tablet 10 mg  10 mg Oral BID Tat, David, MD   10 mg at 10/26/18 2015  . nicotine (NICODERM CQ - dosed in mg/24 hours) patch 21 mg  21 mg Transdermal Daily Tat, Shanon Brow, MD   21 mg at 10/26/18 1812  . ondansetron (ZOFRAN) tablet 4 mg  4 mg Oral Q6H PRN Tat, David, MD       Or  . ondansetron (ZOFRAN) injection 4 mg  4 mg Intravenous Q6H PRN Tat, David, MD       . potassium chloride SA (K-DUR) CR tablet 20 mEq  20 mEq Oral Daily Tat, David, MD      . vitamin B-12 (CYANOCOBALAMIN) tablet 6,000 mcg  6,000 mcg Oral Daily Tat, Shanon Brow, MD        Allergies as of 10/26/2018 - Review Complete 10/26/2018  Allergen Reaction Noted  . Penicillins Hives and Swelling 07/28/2010  . Benadryl [diphenhydramine hcl] Hives 12/02/2010  . Msm [methylsulfonylmethane] Hives 06/02/2014  . Nubain [nalbuphine hcl] Hives 06/02/2014  . Sulfa antibiotics Hives 07/28/2010  . Codeine  07/28/2010    Family History  Problem Relation Age of Onset  . Hypertension Mother   . Angina Mother   . COPD Father   . Heart failure Father   . Cancer Sister   . Dementia Sister   . Cardiomyopathy Brother   . Pneumonia Other   . Migraines Sister   . Colon cancer Neg Hx     Social History   Socioeconomic History  . Marital status: Married    Spouse name: Not on file  . Number of children: Not on file  . Years of education: 88  . Highest education level: Not on file  Occupational History  . Not on file  Social Needs  . Financial resource strain: Patient refused  . Food insecurity    Worry: Patient refused    Inability: Patient refused  . Transportation needs    Medical: Patient refused    Non-medical: Patient refused  Tobacco Use  . Smoking status: Current Every Day Smoker    Packs/day: 1.00    Years: 45.00    Pack years: 45.00    Types: Cigarettes    Start date: 09/10/1966  . Smokeless tobacco: Never Used  . Tobacco comment: 12/21/16 1 PPD  Substance and Sexual Activity  . Alcohol use: No    Alcohol/week:  0.0 standard drinks  . Drug use: No  . Sexual activity: Yes  Lifestyle  . Physical activity    Days per week: Patient refused    Minutes per session: Patient refused  . Stress: Patient refused  Relationships  . Social Herbalist on phone: Patient refused    Gets together: Patient refused    Attends religious service: Patient refused    Active  member of club or organization: Patient refused    Attends meetings of clubs or organizations: Patient refused    Relationship status: Patient refused  . Intimate partner violence    Fear of current or ex partner: Patient refused    Emotionally abused: Patient refused    Physically abused: Patient refused    Forced sexual activity: Patient refused  Other Topics Concern  . Not on file  Social History Narrative   Patient is right handed.   Patient drinks 2 cups of caffeine daily.   Education high school    Review of Systems: See HPI, otherwise normal ROS  Physical Exam: Temp:  [97.1 F (36.2 C)-97.7 F (36.5 C)] 97.6 F (36.4 C) (09/13 0619) Pulse Rate:  [69-75] 75 (09/13 0619) Resp:  [13-16] 13 (09/13 0619) BP: (116-132)/(78-87) 130/84 (09/13 0619) SpO2:  [96 %-100 %] 100 % (09/13 0619) Last BM Date: 10/26/18   Patient is alert and in no acute distress. She knows that she is in the hospital.  He responds appropriately to questions but she does not remember what happened when she was at the restaurant. Conjunctivae is pink.  Sclerae nonicteric. Oropharyngeal mucosa is normal. She has several teeth missing.  Remaining in fair condition.  She states she uses partial upper plate. Neck without masses or thyromegaly. Cardiac exam with regular rhythm normal S1 and S2.  No murmur or gallop noted. Auscultation lungs reveal vesicular breath sounds bilaterally without rales or rhonchi. Abdomen is symmetrical.  She has small ecchymosis in left lower quadrant Pfannenstiel and appendectomy scars.  Bowel sounds are normal.  On palpation abdomen is soft and nontender with organomegaly or masses. No peripheral edema or clubbing noted.  Lab Results: Recent Labs    10/26/18 0927 10/27/18 0702  WBC 8.2 8.8  HGB 12.9 11.3*  HCT 39.7 35.2*  PLT 406* 332   BMET Recent Labs    10/26/18 0927 10/27/18 0702  NA 135 139  K 2.5* 4.3  CL 106 117*  CO2 22 19*  GLUCOSE 106* 98  BUN 20 12   CREATININE 1.67* 1.26*  CALCIUM 8.6* 8.1*   LFT Recent Labs    10/26/18 0927  PROT 7.7  ALBUMIN 3.9  AST 21  ALT 21  ALKPHOS 93  BILITOT 0.3    Assessment;  Acute diarrhea.  Patient's diarrhea started 10 days ago within few hours of eating at a [employer].  Symptomatic therapy has not provided relief.  Therefore would consider initiating antibiotic therapy as soon as stool specimen has been obtained for studies.  She has a history of C. difficile colitis back in January this year precipitated by 2 course of antibiotics for bronchitis.  She was acutely ill then.  I do not believe she has C. difficile colitis.  She has infectious diarrhea until proven otherwise.  Syncope secondary to dehydration and hypokalemia.  Acute injury secondary to dehydration.  Renal function has improved with IV hydration.  History of dementia.  Dementia appears to be stable.  According to patient's husband he scored better this year  than last year.  Short-term memory is poor but long-term memory intact.  Recommendations;  Loperamide 2 mg p.o. twice daily x 2 doses today. Will begin Cipro and metronidazole as soon as stool sample obtained for studies.   LOS: 0 days   Najeeb Rehman  10/27/2018, 12:36 PM

## 2018-10-28 DIAGNOSIS — E860 Dehydration: Principal | ICD-10-CM

## 2018-10-28 DIAGNOSIS — R55 Syncope and collapse: Secondary | ICD-10-CM

## 2018-10-28 DIAGNOSIS — N179 Acute kidney failure, unspecified: Secondary | ICD-10-CM

## 2018-10-28 DIAGNOSIS — I429 Cardiomyopathy, unspecified: Secondary | ICD-10-CM

## 2018-10-28 DIAGNOSIS — I428 Other cardiomyopathies: Secondary | ICD-10-CM

## 2018-10-28 DIAGNOSIS — I447 Left bundle-branch block, unspecified: Secondary | ICD-10-CM

## 2018-10-28 DIAGNOSIS — R197 Diarrhea, unspecified: Secondary | ICD-10-CM

## 2018-10-28 LAB — MAGNESIUM: Magnesium: 1.7 mg/dL (ref 1.7–2.4)

## 2018-10-28 LAB — CBC
HCT: 36.9 % (ref 36.0–46.0)
Hemoglobin: 11.6 g/dL — ABNORMAL LOW (ref 12.0–15.0)
MCH: 30.6 pg (ref 26.0–34.0)
MCHC: 31.4 g/dL (ref 30.0–36.0)
MCV: 97.4 fL (ref 80.0–100.0)
Platelets: 359 10*3/uL (ref 150–400)
RBC: 3.79 MIL/uL — ABNORMAL LOW (ref 3.87–5.11)
RDW: 13.3 % (ref 11.5–15.5)
WBC: 9.4 10*3/uL (ref 4.0–10.5)
nRBC: 0 % (ref 0.0–0.2)

## 2018-10-28 LAB — ECHOCARDIOGRAM COMPLETE
Height: 62 in
Weight: 1813.06 oz

## 2018-10-28 LAB — GASTROINTESTINAL PANEL BY PCR, STOOL (REPLACES STOOL CULTURE)

## 2018-10-28 LAB — BASIC METABOLIC PANEL
Anion gap: 7 (ref 5–15)
BUN: 7 mg/dL — ABNORMAL LOW (ref 8–23)
CO2: 19 mmol/L — ABNORMAL LOW (ref 22–32)
Calcium: 8.5 mg/dL — ABNORMAL LOW (ref 8.9–10.3)
Chloride: 113 mmol/L — ABNORMAL HIGH (ref 98–111)
Creatinine, Ser: 1.13 mg/dL — ABNORMAL HIGH (ref 0.44–1.00)
GFR calc Af Amer: 59 mL/min — ABNORMAL LOW (ref >60)
GFR calc non Af Amer: 51 mL/min — ABNORMAL LOW (ref >60)
Glucose, Bld: 101 mg/dL — ABNORMAL HIGH (ref 70–99)
Potassium: 3.7 mmol/L (ref 3.5–5.1)
Sodium: 139 mmol/L (ref 135–145)

## 2018-10-28 LAB — URINE CULTURE: Culture: >=100000 — AB

## 2018-10-28 LAB — TSH: TSH: 1.334 u[IU]/mL (ref 0.350–4.500)

## 2018-10-28 MED ORDER — LOSARTAN POTASSIUM 50 MG PO TABS
25.0000 mg | ORAL_TABLET | Freq: Every day | ORAL | Status: DC
  Administered 2018-10-28 – 2018-10-29 (×2): 25 mg via ORAL
  Filled 2018-10-28 (×2): qty 1

## 2018-10-28 MED ORDER — MAGNESIUM SULFATE 2 GM/50ML IV SOLN
2.0000 g | Freq: Once | INTRAVENOUS | Status: AC
  Administered 2018-10-28: 2 g via INTRAVENOUS
  Filled 2018-10-28: qty 50

## 2018-10-28 MED ORDER — CARVEDILOL 3.125 MG PO TABS
3.1250 mg | ORAL_TABLET | Freq: Two times a day (BID) | ORAL | Status: DC
  Administered 2018-10-28 – 2018-10-29 (×2): 3.125 mg via ORAL
  Filled 2018-10-28 (×2): qty 1

## 2018-10-28 MED ORDER — POTASSIUM CHLORIDE CRYS ER 10 MEQ PO TBCR
10.0000 meq | EXTENDED_RELEASE_TABLET | Freq: Once | ORAL | Status: AC
  Administered 2018-10-28: 10 meq via ORAL
  Filled 2018-10-28: qty 1

## 2018-10-28 NOTE — Consult Note (Addendum)
Cardiology Consultation:   Patient ID: Melanie Bradley MRN: [id number]; DOB: Nov 28, 1950  Admit date: 10/26/2018 Date of Consult: 10/28/2018  Primary Care Provider: Sharilyn Sites, MD Primary Cardiologist: Carlyle Dolly, MD   Primary Electrophysiologist:  None     Patient Profile:   Melanie Bradley is a 68 y.o. female with a hx of HTN who is being seen today for the evaluation of new cardiomyopathy at the request of Dr. Carles Collet.  History of Present Illness:   Melanie Bradley is  A 68 yr old female with history of chest pain in 2015 with normal Lexiscan Myoview at that time, rate related left bundle branch block, hypertension, dementia.  Patient was admitted with syncope felt secondary to dehydration and volume depletion because of diarrhea for the past 11 days.  Patient and her husband were waiting in line at a local restaurant and was standing for 5 minutes when she passed out for approximately 30 seconds.  When she woke up she was back to baseline.  No seizure activity.  Blood pressure in the ER 85/60 improved with fluid resuscitation. She had syncope in Jan when she had Cdiff. Past medical history has A. fib and her diagnosis but we do not have that diagnosis in any of our office notes.  Last saw Dr. Harl Bowie 2016.  O yesterday showed LV EF 35 to 40% with severe focal basal septal hypertrophy, diastolic dysfunction and diffuse LV hypokinesis.Denies chest pain, shortness of breath-helps husband in yard but not very active other than that.No edema or significant palpitations. Smokes 1 ppd. Most of history given by husband b/c of dementia. BP low after Cdiff in Jan and losartan was stopped. Just restarted at 1/2 dose about 8 weeks ago.  Heart Pathway Score:     Past Medical History:  Diagnosis Date  . Atrial fibrillation ([place])   . Bloating   . Complication of anesthesia     " hard to wake up sometimes "  . Dementia ([place])   . Diverticula of colon   . Epigastric burning sensation   . GERD  (gastroesophageal reflux disease)   . Headache disorder 06/03/2014  . Headache(784.0)   . Helicobacter pylori gastritis 2004  . Hemorrhoids 06/26/03   Dr. Laural Golden [facility]  . Hiatal hernia 06/26/03   Dr. Laural Golden egd  . Hypertension   . Hypothyroidism   . 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 Memory difficulty 06/03/2014  . Shoulder fracture, left   . Spastic colon   . Wrist fracture     Past Surgical History:  Procedure Laterality Date  . APPENDECTOMY    . CHOLECYSTECTOMY    . COLONOSCOPY  06/26/03   Dr. Kem Boroughs diverticula at the sigmoid colon with one of the transverse colon, normal terminal ileoscopy, small external hemorrhoids the  . COLONOSCOPY N/A 01/20/2016   Procedure: COLONOSCOPY;  Surgeon: Daneil Dolin, MD;  [facility];  Service: Endoscopy;  Laterality: N/A;  2:30 pm  . ESOPHAGOGASTRODUODENOSCOPY  03/15/2011   Dr. Trevor Iha hiatal hernia, abnormal gastric mucosa of unclear significance. Gastric biopsies negative, no H. pylori.Procedure: ESOPHAGOGASTRODUODENOSCOPY (EGD);  Surgeon: Daneil Dolin, MD;  [facility];  Service: Endoscopy;  Laterality: N/A;  7:30  . ORIF HUMERUS FRACTURE Left 05/29/2016   Procedure: LEFT OPEN REDUCTION INTERNAL FIXATION (ORIF) PROXIMAL HUMERUS FRACTURE;  Surgeon: Meredith Pel, MD;  Location: [place];  Service: Orthopedics;  Laterality: Left;  . ORIF WRIST FRACTURE Left 05/29/2016  Procedure: OPEN REDUCTION INTERNAL FIXATION (ORIF) LEFT WRIST FRACTURE;  Surgeon: Meredith Pel, MD;  Location: [place];  Service: Orthopedics;  Laterality: Left;  . TUBAL LIGATION    . UPPER GASTROINTESTINAL ENDOSCOPY  06/26/03   Dr. Wynelle Bourgeois sliding hiatal hernia with 8 mm tongue of gastric type mucosa at distal esophagus bile in the stomach.     Home Medications:  Prior to Admission medications   Medication Sig Start Date End Date Taking? Authorizing Provider  aspirin 81 MG tablet Take 81 mg by mouth  daily.   Yes [provider]  calcium carbonate (CALTRATE 600) 1500 (600 Ca) MG TABS tablet Take 600 mg of elemental calcium by mouth daily. Plus D3   Yes [provider]  Cyanocobalamin (B-12 PO) Take 6,000 mcg by mouth daily.   Yes [provider]  diphenoxylate-atropine (LOMOTIL) 2.5-0.025 MG tablet Take 1 tablet by mouth every 4 (four) hours as needed. 10/25/18  Yes [provider]  donepezil (ARICEPT) 10 MG tablet Take 1 tablet (10 mg total) by mouth at bedtime. 09/02/18  Yes Ward Givens, NP  escitalopram (LEXAPRO) 10 MG tablet Take 10 mg by mouth every evening.  10/16/14  Yes [provider]  memantine (NAMENDA) 10 MG tablet Take 1 tablet (10 mg total) by mouth 2 (two) times daily. 09/02/18  Yes Ward Givens, NP  OVER THE COUNTER MEDICATION Take 1 tablet by mouth daily. cognisense otc supplement   Yes [provider]  SYNTHROID 88 MCG tablet Take 88 mcg by mouth daily. Except on friday 12/15/17  Yes [provider]  alendronate (FOSAMAX) 70 MG tablet Take 70 mg by mouth once a week.  07/27/18   [provider]    Inpatient Medications: Scheduled Meds: . aspirin EC  81 mg Oral Daily  . calcium carbonate  200 mg of elemental calcium Oral TID WC  . carvedilol  3.125 mg Oral BID WC  . ciprofloxacin  500 mg Oral BID  . donepezil  10 mg Oral QHS  . escitalopram  10 mg Oral QPM  . heparin  5,000 Units Subcutaneous Q8H  . levothyroxine  88 mcg Oral Daily  . memantine  10 mg Oral BID  . metroNIDAZOLE  250 mg Oral Q8H  . nicotine  21 mg Transdermal Daily  . potassium chloride SA  20 mEq Oral Daily  . vitamin B-12  6,000 mcg Oral Daily   Continuous Infusions: . 0.9 % NaCl with KCl 40 mEq / L 75 mL/hr (10/28/18 1112)  . magnesium sulfate bolus IVPB 2 g (10/28/18 1238)   PRN Meds: acetaminophen **OR** acetaminophen, haloperidol lactate, ondansetron **OR** ondansetron (ZOFRAN) IV  Allergies:    Allergies  Allergen  Reactions  . Penicillins Hives and Swelling    Has patient had a PCN reaction causing IMMEDIATE RASH, FACIAL/TONGUE/THROAT SWELLING, SOB, OR LIGHTHEADEDNESS WITH HYPOTENSION:  #  #  #  YES  #  #  #  Has patient had a PCN reaction causing severe rash involving mucus membranes or skin necrosis: No Has patient had a PCN reaction that required hospitalization No Has patient had a PCN reaction occurring within the last 10 years: No If all of the above answers are "NO", then may proceed with Cephalosporin use.   . Benadryl [Diphenhydramine Hcl] Hives  . Msm [Methylsulfonylmethane] Hives  . Nubain [Nalbuphine Hcl] Hives  . Sulfa Antibiotics Hives  . Codeine     UNSPECIFIED REACTION [IF AT ALL] REFUSES TO TAKE  Social History:   Social History   Socioeconomic History  . Marital status: Married    Spouse name: Not on file  . Number of children: Not on file  . Years of education: 77  . Highest education level: Not on file  Occupational History  . Not on file  Social Needs  . Financial resource strain: Patient refused  . Food insecurity    Worry: Patient refused    Inability: Patient refused  . Transportation needs    Medical: Patient refused    Non-medical: Patient refused  Tobacco Use  . Smoking status: Current Every Day Smoker    Packs/day: 1.00    Years: 45.00    Pack years: 45.00    Types: Cigarettes    Start date: 09/10/1966  . Smokeless tobacco: Never Used  . Tobacco comment: 12/21/16 1 PPD  Substance and Sexual Activity  . Alcohol use: No    Alcohol/week: 0.0 standard drinks  . Drug use: No  . Sexual activity: Yes  Lifestyle  . Physical activity    Days per week: Patient refused    Minutes per session: Patient refused  . Stress: Patient refused  Relationships  . Social Herbalist on phone: Patient refused    Gets together: Patient refused    Attends religious service: Patient refused    Active member of club or organization: Patient refused     Attends meetings of clubs or organizations: Patient refused    Relationship status: Patient refused  . Intimate partner violence    Fear of current or ex partner: Patient refused    Emotionally abused: Patient refused    Physically abused: Patient refused    Forced sexual activity: Patient refused  Other Topics Concern  . Not on file  Social History Narrative   Patient is right handed.   Patient drinks 2 cups of caffeine daily.   Education [employer]    Family History:    Family History  Problem Relation Age of Onset  . Hypertension Mother   . Angina Mother   . COPD Father   . Heart failure Father   . Cancer Sister   . Dementia Sister   . Cardiomyopathy Brother   . Pneumonia Other   . Migraines Sister   . Colon cancer Neg Hx      ROS:  Please see the history of present illness.  Review of Systems  Reason unable to perform ROS: dementia-husband gives most of history.    All other ROS reviewed and negative.     Physical Exam/Data:   Vitals:   10/27/18 0619 10/27/18 2201 10/28/18 0633 10/28/18 0813  BP: 130/84 (!) 135/95 (!) 156/99   Pulse: 75 79 73   Resp: 13 16    Temp: 97.6 F (36.4 C) 97.8 F (36.6 C) 97.9 F (36.6 C)   TempSrc: Oral Oral Oral   SpO2: 100% 100% 99% 98%  Weight:      Height:        Intake/Output Summary (Last 24 hours) at 10/28/2018 1305 Last data filed at 10/28/2018 1238 Gross per 24 hour  Intake 2482.44 ml  Output 3 ml  Net 2479.44 ml   Last 3 Weights 10/26/2018 09/02/2018 02/21/2018  Weight (lbs) 113 lb 5.1 oz 113 lb 6.4 oz 109 lb 5.6 oz  Weight (kg) 51.4 kg 51.438 kg 49.6 kg     Body mass index is 20.73 kg/m.  General:  Well nourished, well developed, in no acute distress  HEENT: normal Lymph: no adenopathy Neck: no JVD Endocrine:  No thryomegaly Vascular: No carotid bruits; FA pulses 2+ bilaterally without bruits  Cardiac:  normal S1, S2; RRR; 1/6 systolic murmur LSB Lungs:  clear to auscultation bilaterally, no wheezing,  rhonchi or rales  Abd: soft, nontender, no hepatomegaly  Ext: no edema Musculoskeletal:  No deformities, BUE and BLE strength normal and equal Skin: warm and dry  Neuro:  CNs 2-12 intact, no focal abnormalities noted Psych:  Normal affect   EKG:  The EKG was personally reviewed and demonstrates: Normal sinus rhythm with PVCs and LBBB similar to EKG 02/21/2018 Telemetry:  Telemetry was personally reviewed and demonstrates: NSR with freq PCV's  Relevant CV Studies: 2D echo 9/13/2020IMPRESSIONS      1. The left ventricle has moderately reduced systolic function, with an ejection fraction of 35-40%. The cavity size was normal. Severe focal basal septal hypertrophy.. Diastolic dysfunction, grade indeterminate. Left ventricular diffuse hypokinesis.  2. The right ventricle has normal systolic function. The cavity was normal. There is no increase in right ventricular wall thickness.  3. The aortic valve is tricuspid. Mild thickening of the aortic valve. Mild aortic annular calcification noted.  4. The mitral valve is grossly normal. Mild thickening of the mitral valve leaflet. Mitral valve regurgitation is mild to moderate by color flow Doppler.  5. The tricuspid valve is grossly normal.  6. The aorta is normal unless otherwise noted.   FINDINGS  Left Ventricle: The left ventricle has moderately reduced systolic function, with an ejection fraction of 35-40%. The cavity size was normal. Severe focal basal septal hypertrophy.. Diastolic dysfunction, grade indeterminate. Left ventricular diffuse  hypokinesis.   Right Ventricle: The right ventricle has normal systolic function. The cavity was normal. There is no increase in right ventricular wall thickness.   Left Atrium: Left atrial size was normal in size.   Right Atrium: Right atrial size was normal in size.   Interatrial Septum: No atrial level shunt detected by color flow Doppler.   Pericardium: There is no evidence of pericardial effusion.    Mitral Valve: The mitral valve is grossly normal. Mild thickening of the mitral valve leaflet. Mitral valve regurgitation is mild to moderate by color flow Doppler.   Tricuspid Valve: The tricuspid valve is grossly normal. Tricuspid valve regurgitation was not visualized by color flow Doppler.   Aortic Valve: The aortic valve is tricuspid Mild thickening of the aortic valve. Aortic valve regurgitation was not visualized by color flow Doppler. There is no evidence of aortic valve stenosis. Mild aortic annular calcification noted.   Pulmonic Valve: The pulmonic valve was grossly normal. Pulmonic valve regurgitation is not visualized by color flow Doppler.   Aorta: The aorta is normal unless otherwise noted.   Venous: The inferior vena cava is normal in size with greater than 50% respiratory variability.     04/2013 Lexiscan MPI FINDINGS:  The patient's initial EKG revealed a narrow QRS. After she received  Lexiscan, the heart rate increased to 145 and the QRS widened to a  left bundle branch block. The increased rate persisted for several  minutes but then began to slow. When the heart rate was slower, P  waves could be seen. There were no flutter waves. The raw data  reveals no evidence of excess motion. Tomographic images with stress  reveal a small area of moderate decreased uptake in the septum. Rest  images reveal a moderate area of decreased uptake in the septum.  These findings are  most consistent with left bundle branch block.  Wall motion analysis reveals excellent motion with an ejection  fraction of 70%.  IMPRESSION:  This is a low risk scan. There is no scar or ischemia. There is  decreased activity in the septum, consistent with left bundle branch  block. It is noted that the QRS was narrow at the start of the  study. However with Lexiscan, the patient developed tachycardia with  left bundle branch block. Assessment shows that this was most  probably sinus tachycardia  which slowed over time.           Laboratory Data:  High Sensitivity Troponin:   Recent Labs  Lab 10/26/18 0927 10/26/18 1358  TROPONINIHS 4 5     Chemistry Recent Labs  Lab 10/26/18 0927 10/27/18 0702 10/28/18 0621  NA 135 139 139  K 2.5* 4.3 3.7  CL 106 117* 113*  CO2 22 19* 19*  GLUCOSE 106* 98 101*  BUN 20 12 7*  CREATININE 1.67* 1.26* 1.13*  CALCIUM 8.6* 8.1* 8.5*  GFRNONAA 32* 44* 51*  GFRAA 37* 51* 59*  ANIONGAP 7 3* 7    Recent Labs  Lab 10/26/18 0927  PROT 7.7  ALBUMIN 3.9  AST 21  ALT 21  ALKPHOS 93  BILITOT 0.3   Hematology Recent Labs  Lab 10/26/18 0927 10/27/18 0702 10/28/18 0621  WBC 8.2 8.8 9.4  RBC 4.17 3.63* 3.79*  HGB 12.9 11.3* 11.6*  HCT 39.7 35.2* 36.9  MCV 95.2 97.0 97.4  MCH 30.9 31.1 30.6  MCHC 32.5 32.1 31.4  RDW 13.1 13.2 13.3  PLT 406* 332 359   BNPNo results for input(s): BNP, PROBNP in the last 168 hours.  DDimer No results for input(s): DDIMER in the last 168 hours.   Radiology/Studies:  No results found.  Assessment and Plan:   1. Syncope felt secondary to hypotension and volume depletion from diarrhea but with new LVD and PCV's need to rule out arrhythmia. 30 day monitor. 2. Cardiomyopathy ejection fraction 35 to 40% new-resume losartan-was on 50 mg PTA-will start at 25 mg daily. Could benefit from coreg as well. Will also need outpatient stress test once recovered from current illness. 3. History of intermittent left bundle branch block 4. Essential hypertension BP low from volume depletion but now up. 5. Hyperlipidemia 6. Dementia 7. Questionable history of atrial fibrillation as it is in her past medical history but I cannot find any documentation of that      For questions or updates, please contact [facility] Please consult [email or web address] for contact info under     Signed, Ermalinda Barrios, PA-C  10/28/2018 1:05 PM   The patient was seen and examined, and I agree with the history, physical  exam, assessment and plan as documented above, with modifications as noted below. I have also personally reviewed all relevant documentation, old records, labs, and both radiographic and cardiovascular studies. I have also independently interpreted old and new ECG's.  Briefly, this is a 68 year old woman who has been hospitalized with syncope secondary to volume depletion/dehydration due to over a week and a half of diarrhea.  She was placed on Lomotil by her PCP but this did not help.  I spoke to her husband who is present at the time of my exam.  The patient has dementia.  She denied antecedent chest pain, palpitations, shortness of breath.  She does some yard work around the house with her husband but no strenuous activity.  She has no exertional  chest pain or dyspnea with this.  She was last seen by Dr. Harl Bowie in 2016.  She underwent a low risk nuclear stress test in 2015 which I reviewed.  I personally reviewed her echocardiogram which was performed yesterday.  LVEF is 35 to 40% with diffuse hypokinesis.  She is hypertensive today.  After results of echocardiogram were seen by hospitalist, he started carvedilol and first dose will be given later this afternoon.  We will start losartan 25 mg daily.  Blood pressure will need close monitoring.  She will need a 30-day event monitor at the time of discharge given new cardiomyopathy.  Left bundle branch block is old and seen by ECG in 2018 as well.  She is having PVCs so a sustained arrhythmia will need to be ruled out.  She will need an outpatient stress test.  No further recommendations at this time.  We will sign off.  We will arrange for outpatient follow-up.   Kate Sable, MD, [facility]  10/28/2018 1:22 PM

## 2018-10-28 NOTE — Progress Notes (Signed)
PROGRESS NOTE  Melanie Bradley [id number] DOB: 09/11/1950 DOA: 10/26/2018 PCP: Sharilyn Sites, MD   Brief History:  68 y.o.femalewith medical history ofdementia, COPD, hypertension, IBS, remote atrial flutter presenting with a syncopal episode. The patient and her husband were at a [employer]. They were standing in line waiting for table. They had been standing up for approximately 5 minutes when the patient complained of feeling hot. Shortly thereafter, the patient had a syncopal episode lasting approximately 30 seconds. When the patient woke up, she was back to her usual baseline. There was no tonic-clonic activity, bowel or bladder incontinence, or tongue biting. Husband states that the patient has similar episode when she had C. difficile in January 2020. Because of the patient's dementia, much of this history is supplemented by the patient's spouse at the bedside. He states that she has had diarrhea for the better part of a week having up to 10 episodes per day. There is been no hematochezia, melena, fevers, chills, abdominal pain. She has not been started any new medications or antibiotics. There is not been any recent travels or eating any undercooked or exotic foods. There is been no sick contacts. They went to see their primary care provider on 10/25/2018 and the patient was given a prescription for Lomotil. They have been using Imodium at home. The Lomotil did not help. Regarding the syncopal episode, there was no prodromal symptoms including aura, chest discomfort, shortness of breath, palpitations. In the emergency department, the patient was afebrile.The patient initially was mildly hypotensive with a blood pressure of 85/60. This improved with fluid resuscitation. saturating 99% on room air. BMP showed a potassium of 2.5 with serum creatinine 1.67. LFTs were unremarkable. Magnesium 1.7. CBC was unremarkable. EKG showed sinus rhythm with  nonspecific T wave changes.  Assessment/Plan: Syncope -secondary to volume depletion/vasovagal -Remain on telemetry--no concerning dysrhythmia -Echocardiogram--EF 35-40%, diffuse HK, mild-mod MR  Diarrhea -pt continues to have at least 15 BMs in last 23 hours -Check C. Difficile--neg -Stool pathogen panel--pending -pt continues with difficulty controlling BMs due to fecal urgency and frequency -given amount of volume loss, she is at high risk for volume depletion and recurrent renal failure -GI consult--appreciated -cipro/flagyl per GI  Cardiomyopathy -newly diagnosed--?stress induced --Echocardiogram--EF 35-40%, diffuse HK, mild-mod MR -start coreg -cardiology consult  AKI -baseline creatining 0.8-0.9 -presented with serum creatinine 1.67 -due to volume depletion -continue IVF  Hypokalemia -Secondary to GI losses -Replete -Magnesium 1.7  Hypothyroidism -Continue Synthroid -Check TSH--1.334  Dementia without behavioral disturbance -Continue Aricept and Namenda  Tobacco abuse I have discussed tobacco cessation with the patient.  I have counseled the patient regarding the negative impacts of continued tobacco use including but not limited to lung cancer, COPD, and cardiovascular disease.  I have discussed alternatives to tobacco and modalities that may help facilitate tobacco cessation including but not limited to biofeedback, hypnosis, and medications.  Total time spent with tobacco counseling was 4 minutes.        Disposition Plan:   Home in 1-2 days  Family Communication:   Spouse updated at bedside 9/14  Consultants:  GI  Code Status:  FULL   DVT Prophylaxis:  Beggs Heparin   Procedures: As Listed in Progress Note Above  Antibiotics: cipro 9/13>>> Flagyl 9/13>>>   Total time spent 35 minutes.  Greater than 50% spent face to face counseling and coordinating care.     Subjective: Pt has had 20 BMs since yesterday am..  No  hematochezia, melena, abd pain, f/c, cp,sob  Objective: Vitals:   10/27/18 0619 10/27/18 2201 10/28/18 0633 10/28/18 0813  BP: 130/84 (!) 135/95 (!) 156/99   Pulse: 75 79 73   Resp: 13 16    Temp: 97.6 F (36.4 C) 97.8 F (36.6 C) 97.9 F (36.6 C)   TempSrc: Oral Oral Oral   SpO2: 100% 100% 99% 98%  Weight:      Height:        Intake/Output Summary (Last 24 hours) at 10/28/2018 1216 Last data filed at 10/28/2018 0900 Gross per 24 hour  Intake 2482.44 ml  Output -  Net 2482.44 ml   Weight change:  Exam:   General:  Pt is alert, follows commands appropriately, not in acute distress  HEENT: No icterus, No thrush, No neck mass, [place]/AT  Cardiovascular: RRR, S1/S2, no rubs, no gallops  Respiratory: bibasilar crackles, no wheeze  Abdomen: Soft/+BS, non tender, non distended, no guarding  Extremities: No edema, No lymphangitis, No petechiae, No rashes, no synovitis   Data Reviewed: I have personally reviewed following labs and imaging studies Basic Metabolic Panel: Recent Labs  Lab 10/26/18 0927 10/27/18 0702 10/28/18 0621  NA 135 139 139  K 2.5* 4.3 3.7  CL 106 117* 113*  CO2 22 19* 19*  GLUCOSE 106* 98 101*  BUN 20 12 7*  CREATININE 1.67* 1.26* 1.13*  CALCIUM 8.6* 8.1* 8.5*  MG 1.7 2.1 1.7   Liver Function Tests: Recent Labs  Lab 10/26/18 0927  AST 21  ALT 21  ALKPHOS 93  BILITOT 0.3  PROT 7.7  ALBUMIN 3.9   No results for input(s): LIPASE, AMYLASE in the last 168 hours. No results for input(s): AMMONIA in the last 168 hours. Coagulation Profile: No results for input(s): INR, PROTIME in the last 168 hours. CBC: Recent Labs  Lab 10/26/18 0927 10/27/18 0702 10/28/18 0621  WBC 8.2 8.8 9.4  NEUTROABS 5.2  --   --   HGB 12.9 11.3* 11.6*  HCT 39.7 35.2* 36.9  MCV 95.2 97.0 97.4  PLT 406* 332 359   Cardiac Enzymes: No results for input(s): CKTOTAL, CKMB, CKMBINDEX, TROPONINI in the last 168 hours. BNP: Invalid input(s): POCBNP CBG: No  results for input(s): GLUCAP in the last 168 hours. HbA1C: No results for input(s): HGBA1C in the last 72 hours. Urine analysis:    Component Value Date/Time   COLORURINE YELLOW 10/26/2018 0920   APPEARANCEUR HAZY (A) 10/26/2018 0920   LABSPEC 1.013 10/26/2018 0920   PHURINE 5.0 10/26/2018 0920   GLUCOSEU NEGATIVE 10/26/2018 0920   HGBUR NEGATIVE 10/26/2018 0920   BILIRUBINUR NEGATIVE 10/26/2018 0920   KETONESUR NEGATIVE 10/26/2018 0920   PROTEINUR NEGATIVE 10/26/2018 0920   NITRITE NEGATIVE 10/26/2018 0920   LEUKOCYTESUR NEGATIVE 10/26/2018 0920   Sepsis Labs: @LABRCNTIP (procalcitonin:4,lacticidven:4) ) Recent Results (from the past 240 hour(s))  Urine culture     Status: Abnormal   Collection Time: 10/26/18  9:20 AM   Specimen: Urine, Clean Catch  Result Value Ref Range Status   Specimen Description   Final    URINE, CLEAN CATCH Performed at [facility], [address], [place], [place] [address]    Special Requests   Final    NONE Performed at [facility], [address], [place], [place] [address]    Culture (A)  Final    >=100,000 COLONIES/mL MULTIPLE SPECIES PRESENT, SUGGEST RECOLLECTION   Report Status 10/28/2018 FINAL  Final  SARS Coronavirus 2 [facility] order, Performed in [facility] hospital lab) Nasopharyngeal Nasopharyngeal Swab     Status: None   Collection Time: 10/26/18 10:38 AM   Specimen: Nasopharyngeal Swab  Result Value Ref Range Status   SARS Coronavirus 2 NEGATIVE NEGATIVE Final    Comment: (NOTE) If result is NEGATIVE SARS-CoV-2 target nucleic acids are NOT DETECTED. The SARS-CoV-2 RNA is generally detectable in upper and lower  respiratory specimens during the acute phase of infection. The lowest  concentration of SARS-CoV-2 viral copies this assay can detect is 250  copies / mL. A negative result does not preclude SARS-CoV-2 infection  and should not be used as the sole basis for treatment or other  patient management decisions.  A negative  result may occur with  improper specimen collection / handling, submission of specimen other  than nasopharyngeal swab, presence of viral mutation(s) within the  areas targeted by this assay, and inadequate number of viral copies  (<250 copies / mL). A negative result must be combined with clinical  observations, patient history, and epidemiological information. If result is POSITIVE SARS-CoV-2 target nucleic acids are DETECTED. The SARS-CoV-2 RNA is generally detectable in upper and lower  respiratory specimens dur ing the acute phase of infection.  Positive  results are indicative of active infection with SARS-CoV-2.  Clinical  correlation with patient history and other diagnostic information is  necessary to determine patient infection status.  Positive results do  not rule out bacterial infection or co-infection with other viruses. If result is PRESUMPTIVE POSTIVE SARS-CoV-2 nucleic acids MAY BE PRESENT.   A presumptive positive result was obtained on the submitted specimen  and confirmed on repeat testing.  While 2019 novel coronavirus  (SARS-CoV-2) nucleic acids may be present in the submitted sample  additional confirmatory testing may be necessary for epidemiological  and / or clinical management purposes  to differentiate between  SARS-CoV-2 and other Sarbecovirus currently known to infect humans.  If clinically indicated additional testing with an alternate test  methodology [phone]) is advised. The SARS-CoV-2 RNA is generally  detectable in upper and lower respiratory sp ecimens during the acute  phase of infection. The expected result is Negative. Fact Sheet for Patients:  StrictlyIdeas.no Fact Sheet for Healthcare Providers: BankingDealers.co.za This test is not yet approved or cleared by the [place] FDA and has been authorized for detection and/or diagnosis of SARS-CoV-2 by FDA under an Emergency Use Authorization  (EUA).  This EUA will remain in effect (meaning this test can be used) for the duration of the COVID-19 declaration under Section 564(b)(1) of the Act, 21 U.S.C. section 360bbb-3(b)(1), unless the authorization is terminated or revoked sooner. Performed at [facility], [address], [place], [place] [address]   C difficile quick scan w PCR reflex     Status: None   Collection Time: 10/26/18  1:47 PM   Specimen: Stool  Result Value Ref Range Status   C Diff antigen NEGATIVE NEGATIVE Final   C Diff toxin NEGATIVE NEGATIVE Final   C Diff interpretation No C. difficile detected.  Final    Comment: Performed at [facility], [address] [place],  [address]     Scheduled Meds: . aspirin EC  81 mg Oral Daily  . calcium carbonate  200 mg of elemental calcium Oral TID WC  . ciprofloxacin  500 mg Oral BID  . donepezil  10 mg Oral QHS  . escitalopram  10 mg Oral QPM  . heparin  5,000 Units Subcutaneous Q8H  . levothyroxine  88  mcg Oral Daily  . memantine  10 mg Oral BID  . metroNIDAZOLE  250 mg Oral Q8H  . nicotine  21 mg Transdermal Daily  . potassium chloride SA  20 mEq Oral Daily  . vitamin B-12  6,000 mcg Oral Daily   Continuous Infusions: . 0.9 % NaCl with KCl 40 mEq / L 75 mL/hr (10/28/18 1112)    Procedures/Studies: No results found.  Orson Eva, DO  [facility] Pager [phone]  If 7PM-7AM, please contact night-coverage [email or web address] Password [email or web address] 10/28/2018, 12:16 PM   LOS: 1 day

## 2018-10-28 NOTE — Discharge Summary (Signed)
Physician Discharge Summary  Melanie Bradley [id number] DOB: 10-04-1950 DOA: 10/26/2018  PCP: Sharilyn Sites, MD  Admit date: 10/26/2018 Discharge date: 10/29/2018  Admitted From: Home Disposition:  Home   Recommendations for Outpatient Follow-up:  1. Follow up with PCP in 1-2 weeks 2. Please obtain BMP/CBC in one week 3. Follow up cardiology 11/19/18    Discharge Condition: Stable CODE STATUS: FULL Diet recommendation: Heart Healthy   Brief/Interim Summary: 68 y.o.femalewith medical history ofdementia, COPD, hypertension, IBS, remote atrial flutter presenting with a syncopal episode. The patient and her husband were at a [employer]. They were standing in line waiting for table. They had been standing up for approximately 5 minutes when the patient complained of feeling hot. Shortly thereafter, the patient had a syncopal episode lasting approximately 30 seconds. When the patient woke up, she was back to her usual baseline. There was no tonic-clonic activity, bowel or bladder incontinence, or tongue biting. Husband states that the patient has similar episode when she had C. difficile in January 2020. Because of the patient's dementia, much of this history is supplemented by the patient's spouse at the bedside. He states that she has had diarrhea for the better part of a week having up to 10 episodes per day. There is been no hematochezia, melena, fevers, chills, abdominal pain. She has not been started any new medications or antibiotics. There is not been any recent travels or eating any undercooked or exotic foods. There is been no sick contacts. They went to see their primary care provider on 10/25/2018 and the patient was given a prescription for Lomotil. They have been using Imodium at home. The Lomotil did not help. Regarding the syncopal episode, there was no prodromal symptoms including aura, chest discomfort, shortness of breath, palpitations. In the  emergency department, the patient was afebrile.The patient initially was mildly hypotensive with a blood pressure of 85/60. This improved with fluid resuscitation. saturating 99% on room air. BMP showed a potassium of 2.5 with serum creatinine 1.67. LFTs were unremarkable. Magnesium 1.7. CBC was unremarkable. EKG showed sinus rhythm with nonspecific T wave changes.  Discharge Diagnoses:  Syncope -secondary to volume depletion/vasovagal -Remain on telemetry--no concerning dysrhythmia -Echocardiogram--EF 35-40%, diffuse HK, mild-mod MR  Diarrhea -pt continues to have at least 15 BMs in last 23 hours -Check C. Difficile--neg -Stool pathogen panel--neg -pt continues with difficulty controlling BMs due to fecal urgency and frequency -given amount of volume loss, she is at high risk for volume depletion and recurrent renal failure -GI consult--appreciated -cipro/flagyl per GI-->plan 5 days total, 3 more days after d/c -diarrhea slowing down since starting cipro/flagyl  Cardiomyopathy -newly diagnosed--?stress induced --Echocardiogram--EF 35-40%, diffuse HK, mild-mod MR -started coreg -cardiology consult appreciated--restart lower dose losartan; outpt stress test -reduce home losartan dose to 25 mg daily  AKI -baseline creatining 0.8-1.1 -presented with serum creatinine 1.67 -serum creatinine 1.20 on day of d/c -due to volume depletion -continue IVF-->saline locked and renal function remained stable  Hypokalemia -Secondary to GI losses -Replete -Magnesium 1.7  Hypothyroidism -Continue Synthroid -Check TSH--1.334  Dementia without behavioral disturbance -Continue Aricept and Namenda  Tobacco abuse I have discussed tobacco cessation with the patient. I have counseled the patient regarding the negative impacts of continued tobacco use including but not limited to lung cancer, COPD, and cardiovascular disease. I have discussed alternatives to tobacco and modalities  that may help facilitate tobacco cessation including but not limited to biofeedback, hypnosis, and medications. Total time spent with tobacco counseling was 4 minutes.  Discharge Instructions   Allergies as of 10/29/2018      Reactions   Penicillins Hives, Swelling   Has patient had a PCN reaction causing IMMEDIATE RASH, FACIAL/TONGUE/THROAT SWELLING, SOB, OR LIGHTHEADEDNESS WITH HYPOTENSION:  #  #  #  YES  #  #  #  Has patient had a PCN reaction causing severe rash involving mucus membranes or skin necrosis: No Has patient had a PCN reaction that required hospitalization No Has patient had a PCN reaction occurring within the last 10 years: No If all of the above answers are "NO", then may proceed with Cephalosporin use.   Benadryl [diphenhydramine Hcl] Hives   Msm [methylsulfonylmethane] Hives   Nubain [nalbuphine Hcl] Hives   Sulfa Antibiotics Hives   Codeine    UNSPECIFIED REACTION [IF AT ALL] REFUSES TO TAKE      Medication List    TAKE these medications   alendronate 70 MG tablet Commonly known as: FOSAMAX Take 70 mg by mouth once a week.   aspirin 81 MG tablet Take 81 mg by mouth daily.   B-12 PO Take 6,000 mcg by mouth daily.   Caltrate 600 1500 (600 Ca) MG Tabs tablet Generic drug: calcium carbonate Take 600 mg of elemental calcium by mouth daily. Plus D3   carvedilol 3.125 MG tablet Commonly known as: COREG Take 1 tablet (3.125 mg total) by mouth 2 (two) times daily with a meal.   ciprofloxacin 500 MG tablet Commonly known as: CIPRO Take 1 tablet (500 mg total) by mouth 2 (two) times daily.   diphenoxylate-atropine 2.5-0.025 MG tablet Commonly known as: LOMOTIL Take 1 tablet by mouth every 4 (four) hours as needed.   donepezil 10 MG tablet Commonly known as: ARICEPT Take 1 tablet (10 mg total) by mouth at bedtime.   escitalopram 10 MG tablet Commonly known as: LEXAPRO Take 10 mg by mouth every evening.   losartan 25 MG tablet Commonly known as:  COZAAR Take 1 tablet (25 mg total) by mouth daily. Start taking on: October 30, 2018   memantine 10 MG tablet Commonly known as: NAMENDA Take 1 tablet (10 mg total) by mouth 2 (two) times daily.   metroNIDAZOLE 250 MG tablet Commonly known as: FLAGYL Take 1 tablet (250 mg total) by mouth every 8 (eight) hours.   OVER THE COUNTER MEDICATION Take 1 tablet by mouth daily. cognisense otc supplement   Synthroid 88 MCG tablet Generic drug: levothyroxine Take 88 mcg by mouth daily. Except on [place], [place], PA-C Follow up.   Specialties: Physician Assistant, Cardiology Why: [facility]iology Hospital Follow-up on 11/19/2018 at 3:30 PM.  Contact information: [place] [address] [phone]          Allergies  Allergen Reactions  . Penicillins Hives and Swelling    Has patient had a PCN reaction causing IMMEDIATE RASH, FACIAL/TONGUE/THROAT SWELLING, SOB, OR LIGHTHEADEDNESS WITH HYPOTENSION:  #  #  #  YES  #  #  #  Has patient had a PCN reaction causing severe rash involving mucus membranes or skin necrosis: No Has patient had a PCN reaction that required hospitalization No Has patient had a PCN reaction occurring within the last 10 years: No If all of the above answers are "NO", then may proceed with Cephalosporin use.   . Benadryl [Diphenhydramine Hcl] Hives  . Msm [Methylsulfonylmethane] Hives  . Nubain [Nalbuphine Hcl] Hives  . Sulfa Antibiotics Hives  .  Codeine     UNSPECIFIED REACTION [IF AT ALL] REFUSES TO TAKE    Consultations:  GI  cardiology   Procedures/Studies: No results found.      Discharge Exam: Vitals:   10/29/18 0515 10/29/18 0831  BP: (!) 133/97   Pulse: 81   Resp: 16   Temp: 97.8 F (36.6 C)   SpO2: 98% 99%   Vitals:   10/28/18 2127 10/28/18 2129 10/29/18 0515 10/29/18 0831  BP: (!) 135/102 (!) 129/106 (!) 133/97   Pulse: 90 92 81   Resp: 16  16   Temp: 97.8 F (36.6 C)   97.8 F (36.6 C)   TempSrc: Oral  Oral   SpO2: 100% 99% 98% 99%  Weight:      Height:        General: Pt is alert, awake, not in acute distress Cardiovascular: RRR, S1/S2 +, no rubs, no gallops Respiratory: CTA bilaterally, no wheezing, no rhonchi Abdominal: Soft, NT, ND, bowel sounds + Extremities: no edema, no cyanosis   The results of significant diagnostics from this hospitalization (including imaging, microbiology, ancillary and laboratory) are listed below for reference.    Significant Diagnostic Studies: No results found.   Microbiology: Recent Results (from the past 240 hour(s))  Urine culture     Status: Abnormal   Collection Time: 10/26/18  9:20 AM   Specimen: Urine, Clean Catch  Result Value Ref Range Status   Specimen Description   Final    URINE, CLEAN CATCH Performed at [facility], [address] [place], [place] [address]    Special Requests   Final    NONE Performed at [facility], [address], [place], [place] [address]    Culture (A)  Final    >=100,000 COLONIES/mL MULTIPLE SPECIES PRESENT, SUGGEST RECOLLECTION   Report Status 10/28/2018 FINAL  Final  SARS Coronavirus 2 [facility] order, Performed in [facility] hospital lab) Nasopharyngeal Nasopharyngeal Swab     Status: None   Collection Time: 10/26/18 10:38 AM   Specimen: Nasopharyngeal Swab  Result Value Ref Range Status   SARS Coronavirus 2 NEGATIVE NEGATIVE Final    Comment: (NOTE) If result is NEGATIVE SARS-CoV-2 target nucleic acids are NOT DETECTED. The SARS-CoV-2 RNA is generally detectable in upper and lower  respiratory specimens during the acute phase of infection. The lowest  concentration of SARS-CoV-2 viral copies this assay can detect is 250  copies / mL. A negative result does not preclude SARS-CoV-2 infection  and should not be used as the sole basis for treatment or other  patient management decisions.  A negative result may occur with  improper specimen collection /  handling, submission of specimen other  than nasopharyngeal swab, presence of viral mutation(s) within the  areas targeted by this assay, and inadequate number of viral copies  (<250 copies / mL). A negative result must be combined with clinical  observations, patient history, and epidemiological information. If result is POSITIVE SARS-CoV-2 target nucleic acids are DETECTED. The SARS-CoV-2 RNA is generally detectable in upper and lower  respiratory specimens dur ing the acute phase of infection.  Positive  results are indicative of active infection with SARS-CoV-2.  Clinical  correlation with patient history and other diagnostic information is  necessary to determine patient infection status.  Positive results do  not rule out bacterial infection or co-infection with other viruses. If result is PRESUMPTIVE POSTIVE SARS-CoV-2 nucleic acids MAY BE PRESENT.   A presumptive positive result was obtained on the submitted specimen  and confirmed on repeat testing.  While 2019 novel coronavirus  (SARS-CoV-2) nucleic acids may be present in the submitted sample  additional confirmatory testing may be necessary for epidemiological  and / or clinical management purposes  to differentiate between  SARS-CoV-2 and other Sarbecovirus currently known to infect humans.  If clinically indicated additional testing with an alternate test  methodology [phone]) is advised. The SARS-CoV-2 RNA is generally  detectable in upper and lower respiratory sp ecimens during the acute  phase of infection. The expected result is Negative. Fact Sheet for Patients:  StrictlyIdeas.no Fact Sheet for Healthcare Providers: BankingDealers.co.za This test is not yet approved or cleared by the [place] FDA and has been authorized for detection and/or diagnosis of SARS-CoV-2 by FDA under an Emergency Use Authorization (EUA).  This EUA will remain in effect (meaning this  test can be used) for the duration of the COVID-19 declaration under Section 564(b)(1) of the Act, 21 U.S.C. section 360bbb-3(b)(1), unless the authorization is terminated or revoked sooner. Performed at [facility], [address], [place], [place] [address]   C difficile quick scan w PCR reflex     Status: None   Collection Time: 10/26/18  1:47 PM   Specimen: Stool  Result Value Ref Range Status   C Diff antigen NEGATIVE NEGATIVE Final   C Diff toxin NEGATIVE NEGATIVE Final   C Diff interpretation No C. difficile detected.  Final    Comment: Performed at [facility], [address], [place], [place] [address]  Gastrointestinal Panel by PCR , Stool     Status: None   Collection Time: 10/26/18  1:47 PM   Specimen: Stool  Result Value Ref Range Status   Campylobacter species NOT DETECTED NOT DETECTED Final   Plesimonas shigelloides NOT DETECTED NOT DETECTED Final   Salmonella species NOT DETECTED NOT DETECTED Final   Yersinia enterocolitica NOT DETECTED NOT DETECTED Final   Vibrio species NOT DETECTED NOT DETECTED Final   Vibrio cholerae NOT DETECTED NOT DETECTED Final   Enteroaggregative E coli (EAEC) NOT DETECTED NOT DETECTED Final   Enteropathogenic E coli (EPEC) NOT DETECTED NOT DETECTED Final   Enterotoxigenic E coli (ETEC) NOT DETECTED NOT DETECTED Final   Shiga like toxin producing E coli (STEC) NOT DETECTED NOT DETECTED Final   Shigella/Enteroinvasive E coli (EIEC) NOT DETECTED NOT DETECTED Final   Cryptosporidium NOT DETECTED NOT DETECTED Final   Cyclospora cayetanensis NOT DETECTED NOT DETECTED Final   Entamoeba histolytica NOT DETECTED NOT DETECTED Final   Giardia lamblia NOT DETECTED NOT DETECTED Final   Adenovirus F40/41 NOT DETECTED NOT DETECTED Final   Astrovirus NOT DETECTED NOT DETECTED Final   Norovirus GI/GII NOT DETECTED NOT DETECTED Final   Rotavirus A NOT DETECTED NOT DETECTED Final   Sapovirus (I, II, IV, and V) NOT DETECTED NOT DETECTED Final    Comment:  Performed at [facility], [address] [place], [place] [address]     Labs: Basic Metabolic Panel: Recent Labs  Lab 10/26/18 0927 10/27/18 0702 10/28/18 0621 10/29/18 0600  NA 135 139 139 137  K 2.5* 4.3 3.7 3.5  CL 106 117* 113* 112*  CO2 22 19* 19* 18*  GLUCOSE 106* 98 101* 115*  BUN 20 12 7* 5*  CREATININE 1.67* 1.26* 1.13* 1.20*  CALCIUM 8.6* 8.1* 8.5* 8.5*  MG 1.7 2.1 1.7 2.0   Liver Function Tests: Recent Labs  Lab 10/26/18 0927  AST 21  ALT 21  ALKPHOS 93  BILITOT 0.3  PROT 7.7  ALBUMIN 3.9   No results for input(s): LIPASE, AMYLASE in the last 168 hours. No results for input(s): AMMONIA in the last 168 hours. CBC: Recent Labs  Lab 10/26/18 0927 10/27/18 0702 10/28/18 0621  WBC 8.2 8.8 9.4  NEUTROABS 5.2  --   --   HGB 12.9 11.3* 11.6*  HCT 39.7 35.2* 36.9  MCV 95.2 97.0 97.4  PLT 406* 332 359   Cardiac Enzymes: No results for input(s): CKTOTAL, CKMB, CKMBINDEX, TROPONINI in the last 168 hours. BNP: Invalid input(s): POCBNP CBG: No results for input(s): GLUCAP in the last 168 hours.  Time coordinating discharge:  36 minutes  Signed:  Orson Eva, DO [facility] Pager: [phone] 10/29/2018, 12:18 PM

## 2018-10-28 NOTE — Progress Notes (Signed)
    Subjective: Liquid stool with some small pieces. Multiple loose stools since this morning, some back to back and some an hour apart. Imodium helped to stop diarrhea for a few hours yesterday. Stool is liquid with small, "sand-like" pieces. Ate chicken tips with peppers and onions from [employer]. Husband also shared plate. Patient ate baked potato and husband ate fries. Diarrhea onset after this.   Husband states patient has history of occasional diarrhea with urgency but overall would have a soft BM daily prior to this. Jan 2020 with Cdiff but returned to baseline after treatment.   Patient states she feels well.   Objective: Vital signs in last 24 hours: Temp:  [97.8 F (36.6 C)-97.9 F (36.6 C)] 97.9 F (36.6 C) (09/14 [id number]) Pulse Rate:  [73-79] 73 (09/14 0633) Resp:  [16] 16 (09/13 2201) BP: (135-156)/(95-99) 156/99 (09/14 [id number]) SpO2:  [98 %-100 %] 98 % (09/14 0813) Last BM Date: 10/28/18 General:   Alert and oriented, poor short-term memory. pleasant Abdomen:  Bowel sounds present, soft, non-tender, non-distended. No HSM or hernias noted.  Extremities:  Without  edema. Neurologic:  Alert and  oriented to person, situation, time.  Psych:   Normal mood and affect.  Intake/Output from previous day: 09/13 0701 - 09/14 0700 In: 2362.4 [P.O.:840; I.V.:1522.4] Out: -  Intake/Output this shift: Total I/O In: 240 [P.O.:240] Out: -   Lab Results: Recent Labs    10/26/18 0927 10/27/18 0702 10/28/18 0621  WBC 8.2 8.8 9.4  HGB 12.9 11.3* 11.6*  HCT 39.7 35.2* 36.9  PLT 406* 332 359   BMET Recent Labs    10/26/18 0927 10/27/18 0702 10/28/18 0621  NA 135 139 139  K 2.5* 4.3 3.7  CL 106 117* 113*  CO2 22 19* 19*  GLUCOSE 106* 98 101*  BUN 20 12 7*  CREATININE 1.67* 1.26* 1.13*  CALCIUM 8.6* 8.1* 8.5*   LFT Recent Labs    10/26/18 0927  PROT 7.7  ALBUMIN 3.9  AST 21  ALT 21  ALKPHOS 93  BILITOT 0.3   Assessment: 68 year old female with acute onset of  diarrhea shortly after eating at a local restaurant approximately 11 days ago, failing outpatient supportive therapy. Presented due to syncope in setting of dehydration and hypokalemia. Renal function improving. Cdiff toxin and antigen negative. Unfortunately, GI pathogen panel remains at [facility] and still to be transported to [place] by courier, where it is processed. Hopeful results this afternoon if received in time. Clinically, she does not appear ill; however, she still is noting multiple, frequent urges for loose stool with some small formed pieces. Husband is at bedside due to history of dementia. Empiric antibiotics started yesterday afternoon (receiving 2 doses of Cipro and 3 of Flagyl thus far).   Will await GI pathogen panel. For now continue empiric antibiotic therapy. May be dealing with more of a post-infectious presentation now; she does not appear acutely ill. Hopeful GI pathogen results today and latest tomorrow.    Plan: Await GI pathogen panel Continue empiric antibiotics for now Supportive measures Will reassess tomorrow morning   Annitta Needs, PhD, ANP-BC [facility]     LOS: 1 day    10/28/2018, 10:41 AM

## 2018-10-29 DIAGNOSIS — I429 Cardiomyopathy, unspecified: Secondary | ICD-10-CM

## 2018-10-29 LAB — BASIC METABOLIC PANEL
Anion gap: 7 (ref 5–15)
BUN: 5 mg/dL — ABNORMAL LOW (ref 8–23)
CO2: 18 mmol/L — ABNORMAL LOW (ref 22–32)
Calcium: 8.5 mg/dL — ABNORMAL LOW (ref 8.9–10.3)
Chloride: 112 mmol/L — ABNORMAL HIGH (ref 98–111)
Creatinine, Ser: 1.2 mg/dL — ABNORMAL HIGH (ref 0.44–1.00)
GFR calc Af Amer: 55 mL/min — ABNORMAL LOW (ref >60)
GFR calc non Af Amer: 47 mL/min — ABNORMAL LOW (ref >60)
Glucose, Bld: 115 mg/dL — ABNORMAL HIGH (ref 70–99)
Potassium: 3.5 mmol/L (ref 3.5–5.1)
Sodium: 137 mmol/L (ref 135–145)

## 2018-10-29 LAB — MAGNESIUM: Magnesium: 2 mg/dL (ref 1.7–2.4)

## 2018-10-29 MED ORDER — METRONIDAZOLE 250 MG PO TABS: 250.0000 mg | ORAL_TABLET | Freq: Three times a day (TID) | ORAL | 0 refills | Status: DC

## 2018-10-29 MED ORDER — CARVEDILOL 3.125 MG PO TABS: 3.1250 mg | ORAL_TABLET | Freq: Two times a day (BID) | ORAL | 1 refills | Status: DC

## 2018-10-29 MED ORDER — CIPROFLOXACIN HCL 500 MG PO TABS: 500.0000 mg | ORAL_TABLET | Freq: Two times a day (BID) | ORAL | 0 refills | Status: DC

## 2018-10-29 MED ORDER — LOSARTAN POTASSIUM 25 MG PO TABS: 25.0000 mg | ORAL_TABLET | Freq: Every day | ORAL | 1 refills | Status: DC

## 2018-10-29 NOTE — Progress Notes (Signed)
Subjective: Diarrhea has greatly improved. Last BM around 7 am. Maybe 1-2 stools over night. Appetite is good. Ate well this morning. Stools are watery and still sand like. No urgency. States she feels she is back to baseline. Has a history of loose stools at baseline. More mushy typically. No blood in the stool or melena. Appetite improved. Ate 1 and 3/4 pancakes this morning and 1/2 sausage. Also eating cheese doodles this morning. No abdominal pain. No nausea or vomiting. No lightheadedness or dizziness. Has been walking around the hallway and in her room this morning. States she is hoping she can go home today.   GI pathogen panel and C. Diff negative.   Objective: Vital signs in last 24 hours: Temp:  [97.8 F (36.6 C)-98 F (36.7 C)] 97.8 F (36.6 C) (09/15 0515) Pulse Rate:  [81-92] 81 (09/15 0515) Resp:  [16-18] 16 (09/15 0515) BP: (129-136)/(97-106) 133/97 (09/15 0515) SpO2:  [98 %-100 %] 99 % (09/15 0831) Last BM Date: 10/29/18 General:   Alert and oriented, pleasant. Walking around room.  Head:  Normocephalic and atraumatic. Eyes:  No icterus, sclera clear. Conjuctiva pink.  Abdomen:  Bowel sounds present, soft, non-tender, non-distended. No HSM or hernias noted. No rebound or guarding. No masses appreciated  Msk:  Symmetrical without gross deformities. Normal posture. Extremities:  Without edema. Neurologic:  Alert and  oriented x4;  grossly normal neurologically. Skin:  Warm and dry, intact without significant lesions.  Psych:  Normal mood and affect.  Intake/Output from previous day: 09/14 0701 - 09/15 0700 In: 1314.8 [P.O.:720; I.V.:544.8; IV Piggyback:50] Out: 6 [Stool:6] Intake/Output this shift: No intake/output data recorded.  Lab Results: Recent Labs    10/27/18 0702 10/28/18 0621  WBC 8.8 9.4  HGB 11.3* 11.6*  HCT 35.2* 36.9  PLT 332 359   BMET Recent Labs    10/27/18 0702 10/28/18 0621 10/29/18 0600  NA 139 139 137  K 4.3 3.7 3.5  CL 117*  113* 112*  CO2 19* 19* 18*  GLUCOSE 98 101* 115*  BUN 12 7* 5*  CREATININE 1.26* 1.13* 1.20*  CALCIUM 8.1* 8.5* 8.5*   Assessment: 68 year old female with acute onset of diarrhea shortly after eating at a local restaurant approximately 12 days ago, failing outpatient supportive therapy. Presented due to syncope in setting of dehydration and hypokalemia. Renal function stable at this time. Electrolytes are within normal limits. Hemoglobin 11.6 and stable without overt GI bleeding. Cdiff toxin and antigen negative. GI pathogen panel negative. She was started on empiric antibiotics with cipro and flagyl on the evening of 9/13. Husband at bedside. He and patient report diarrhea is much improved with only 2 BMs over night and 1 this morning around 7 am. No melena or brbpr. Patient states she feels she is at her baseline and is ready to go home. Has been up walking around the hall and in her room. Stools remain watery and somewhat sand like. Reports she does typically have loose stools at baseline. Urgency has resolved. Appetite has improved.    Suspect patient may have had an acute infectious gastroenteritis thathresolved and may have been followed by post-infectious diarrhea. I suspect this will continue to improve. Would favor continuing antibiotics to complete the course of 5 days. Otherwise, patient needs to continue with keeping up her oral intake to prevent dehydration which she fesls she can do as an outpatient.    Plan: Continue cipro and flagyl to complete 5 day course. Last dose should be  on 11/01/18. Continue regular diet. Advised patient to ensure she drinks plenty of water when she goes home. May also benefit from gatorade in the setting of diarrhea.    LOS: 2 days    10/29/2018, 9:48 AM   Aliene Altes, PA-C [facility]

## 2018-10-29 NOTE — Progress Notes (Signed)
Nsg Discharge Note  Admit Date:  10/26/2018 Discharge date: 10/29/2018   Melanie Bradley to be D/C'd Home per MD order.  AVS completed.  Copy for chart, and copy for patient signed, and dated. Patient/caregiver able to verbalize understanding.  Discharge Medication: Allergies as of 10/29/2018      Reactions   Penicillins Hives, Swelling   Has patient had a PCN reaction causing IMMEDIATE RASH, FACIAL/TONGUE/THROAT SWELLING, SOB, OR LIGHTHEADEDNESS WITH HYPOTENSION:  #  #  #  YES  #  #  #  Has patient had a PCN reaction causing severe rash involving mucus membranes or skin necrosis: No Has patient had a PCN reaction that required hospitalization No Has patient had a PCN reaction occurring within the last 10 years: No If all of the above answers are "NO", then may proceed with Cephalosporin use.   Benadryl [diphenhydramine Hcl] Hives   Msm [methylsulfonylmethane] Hives   Nubain [nalbuphine Hcl] Hives   Sulfa Antibiotics Hives   Codeine    UNSPECIFIED REACTION [IF AT ALL] REFUSES TO TAKE      Medication List    TAKE these medications   alendronate 70 MG tablet Commonly known as: FOSAMAX Take 70 mg by mouth once a week.   aspirin 81 MG tablet Take 81 mg by mouth daily.   B-12 PO Take 6,000 mcg by mouth daily.   Caltrate 600 1500 (600 Ca) MG Tabs tablet Generic drug: calcium carbonate Take 600 mg of elemental calcium by mouth daily. Plus D3   carvedilol 3.125 MG tablet Commonly known as: COREG Take 1 tablet (3.125 mg total) by mouth 2 (two) times daily with a meal.   ciprofloxacin 500 MG tablet Commonly known as: CIPRO Take 1 tablet (500 mg total) by mouth 2 (two) times daily.   diphenoxylate-atropine 2.5-0.025 MG tablet Commonly known as: LOMOTIL Take 1 tablet by mouth every 4 (four) hours as needed.   donepezil 10 MG tablet Commonly known as: ARICEPT Take 1 tablet (10 mg total) by mouth at bedtime.   escitalopram 10 MG tablet Commonly known as: LEXAPRO Take 10  mg by mouth every evening.   losartan 25 MG tablet Commonly known as: COZAAR Take 1 tablet (25 mg total) by mouth daily. Start taking on: October 30, 2018   memantine 10 MG tablet Commonly known as: NAMENDA Take 1 tablet (10 mg total) by mouth 2 (two) times daily.   metroNIDAZOLE 250 MG tablet Commonly known as: FLAGYL Take 1 tablet (250 mg total) by mouth every 8 (eight) hours.   OVER THE COUNTER MEDICATION Take 1 tablet by mouth daily. cognisense otc supplement   Synthroid 88 MCG tablet Generic drug: levothyroxine Take 88 mcg by mouth daily. Except on friday       Discharge Assessment: Vitals:   10/29/18 0515 10/29/18 0831  BP: (!) 133/97   Pulse: 81   Resp: 16   Temp: 97.8 F (36.6 C)   SpO2: 98% 99%   Skin clean, dry and intact without evidence of skin break down, no evidence of skin tears noted. IV catheter discontinued intact. Site without signs and symptoms of complications - no redness or edema noted at insertion site, patient denies c/o pain - only slight tenderness at site.  Dressing with slight pressure applied.  D/c Instructions-Education: Discharge instructions given to patient/family with verbalized understanding. D/c education completed with patient/family including follow up instructions, medication list, d/c activities limitations if indicated, with other d/c instructions as indicated by MD - patient able  to verbalize understanding, all questions fully answered. Patient instructed to return to ED, call 911, or call MD for any changes in condition.  Patient escorted via [place], and D/C home via private auto.  Loa Socks, RN 10/29/2018 12:20 PM

## 2018-11-01 DIAGNOSIS — E860 Dehydration: Secondary | ICD-10-CM | POA: Diagnosis not present

## 2018-11-01 DIAGNOSIS — Z6821 Body mass index (BMI) 21.0-21.9, adult: Secondary | ICD-10-CM | POA: Diagnosis not present

## 2018-11-01 DIAGNOSIS — E876 Hypokalemia: Secondary | ICD-10-CM | POA: Diagnosis not present

## 2018-11-19 ENCOUNTER — Ambulatory Visit: Payer: Medicare HMO | Admitting: Student

## 2018-11-19 ENCOUNTER — Encounter: Payer: Self-pay | Admitting: Student

## 2018-11-19 ENCOUNTER — Other Ambulatory Visit: Payer: Self-pay

## 2018-11-19 VITALS — BP 150/96 | HR 74 | Temp 97.5°F | Ht 62.0 in | Wt 114.0 lb

## 2018-11-19 DIAGNOSIS — R55 Syncope and collapse: Secondary | ICD-10-CM

## 2018-11-19 DIAGNOSIS — I429 Cardiomyopathy, unspecified: Secondary | ICD-10-CM | POA: Diagnosis not present

## 2018-11-19 DIAGNOSIS — I10 Essential (primary) hypertension: Secondary | ICD-10-CM

## 2018-11-19 DIAGNOSIS — E039 Hypothyroidism, unspecified: Secondary | ICD-10-CM | POA: Diagnosis not present

## 2018-11-19 MED ORDER — LOSARTAN POTASSIUM 25 MG PO TABS: 25.0000 mg | ORAL_TABLET | Freq: Every day | ORAL | 3 refills | Status: DC

## 2018-11-19 MED ORDER — LOSARTAN POTASSIUM 25 MG PO TABS: 25.0000 mg | ORAL_TABLET | Freq: Every day | ORAL | 11 refills | Status: DC

## 2018-11-19 MED ORDER — CARVEDILOL 6.25 MG PO TABS: 6.2500 mg | ORAL_TABLET | Freq: Two times a day (BID) | ORAL | 3 refills | Status: DC

## 2018-11-19 NOTE — Patient Instructions (Signed)
Medication Instructions:  Your physician recommends that you continue on your current medications as directed. Please refer to the Current Medication list given to you today.  If you need a refill on your cardiac medications before your next appointment, please call your pharmacy.   Lab work: NONE  If you have labs (blood work) drawn today and your tests are completely normal, you will receive your results only by: . MyChart Message (if you have MyChart) OR . A paper copy in the mail If you have any lab test that is abnormal or we need to change your treatment, we will call you to review the results.  Testing/Procedures: NONE   Follow-Up: At [facility], you and your health needs are our priority.  As part of our continuing mission to provide you with exceptional heart care, we have created designated Provider Care Teams.  These Care Teams include your primary Cardiologist (physician) and Advanced Practice Providers (APPs -  Physician Assistants and Nurse Practitioners) who all work together to provide you with the care you need, when you need it. You will need a follow up appointment in 2 months.  Please call our office 2 months in advance to schedule this appointment.  You may see Branch, Jonathan, MD or one of the following Advanced Practice Providers on your designated Care Team:   Brittany Strader, PA-C ([place] Office) . Michele Lenze, PA-C ([place] Office)  Any Other Special Instructions Will Be Listed Below (If Applicable). Thank you for choosing [facility]!     

## 2018-11-19 NOTE — Progress Notes (Signed)
Cardiology Office Note    Date:  11/19/2018   ID:  Melanie Bradley, DOB May 03, 1950, MRN [id number]  PCP:  Sharilyn Sites, MD  Cardiologist: Carlyle Dolly, MD    Chief Complaint  Patient presents with   Hospitalization Follow-up    History of Present Illness:    Melanie Bradley is a 68 y.o. female with past medical history of HTN, LBBB, Hypothyroidism, IBS, Dementia, remote history of atrial fibrillation (by review of notes and no longer on anticoagulation) and COPD who presents to the office today for hospital follow-up.   She most recently presented to [facility] ED on 10/26/2018 after having a syncopal episode while at a restaurant. She denied any prodromal symptoms and lost consciousness for approximately 30 seconds. Family did report she had been experiencing significant diarrhea, up to 10 episodes per day, and her syncopal episode was thought to be secondary to volume depletion and likely vasovagal.  She was followed by GI during admission and C. difficile and GI pathogen panel were negative.  Her diarrhea was thought to be secondary to acute infectious gastroenteritis and she was treated with a 5-day course of Cipro and Flagyl. Cardiology was consulted during admission as an echocardiogram was performed as part of her syncope work-up and showed a reduced EF of 35 to 40% with severe focal basal septal hypertrophy, diastolic dysfunction and diffuse hypokinesis. She was started on Coreg 3.125mg  BID along with Losartan 25mg  daily. Was recommended to have a 30-day cardiac event monitor to rule out any sustained arrhythmias along with considering a stress test.  In talking with the patient and her husband today, she reports overall doing well since her hospitalization. She reports her episodes of diarrhea have now resolved and she has been trying to increase her fluid intake. She denies any recent chest pain, palpitations, orthopnea, PND or lower extremity edema. She does have dementia  and her husband tries to keep her active in helping with yard work or going fishing. She denies any anginal symptoms when performing these activities.  They do have a blood pressure cuff at home but do not check this regularly. BP is elevated at 150/96 during today's visit.  A monitor was mailed to the patient's home but they were unsure what to do with this and have kept it in the box.   Past Medical History:  Diagnosis Date   Atrial fibrillation (HCC)    Bloating    Complication of anesthesia     " hard to wake up sometimes "   Dementia ([place])    Diverticula of colon    Epigastric burning sensation    GERD (gastroesophageal reflux disease)    Headache disorder 06/03/2014   [id number])    Helicobacter pylori gastritis 2004   Hemorrhoids 06/26/03   Dr. Laural Golden tcs   Hiatal hernia 06/26/03   Dr. 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 Kem Boroughs diverticula at the sigmoid colon with one of the transverse colon, normal terminal ileoscopy, small external hemorrhoids the   COLONOSCOPY N/A 01/20/2016   Procedure: COLONOSCOPY;  Surgeon: Daneil Dolin, MD;  Location[facility];  Service: Endoscopy;  Laterality: N/A;  2:30 pm   ESOPHAGOGASTRODUODENOSCOPY  03/15/2011   Dr. Trevor Iha hiatal hernia, abnormal gastric mucosa of unclear significance. Gastric biopsies negative, no H. pylori.Procedure: ESOPHAGOGASTRODUODENOSCOPY (EGD);  Surgeon: Daneil Dolin, MD;  Location: [facility];  Service: Endoscopy;  Laterality: N/A;  7:30   ORIF HUMERUS FRACTURE Left 05/29/2016   Procedure: LEFT OPEN REDUCTION INTERNAL FIXATION (ORIF) PROXIMAL HUMERUS FRACTURE;  Surgeon: Meredith Pel,  MD;  Location: [place];  Service: Orthopedics;  Laterality: Left;   ORIF WRIST FRACTURE Left 05/29/2016   Procedure: OPEN REDUCTION INTERNAL FIXATION (ORIF) LEFT WRIST FRACTURE;  Surgeon: Meredith Pel, MD;  Location: [place];  Service: Orthopedics;  Laterality: Left;   TUBAL LIGATION     UPPER GASTROINTESTINAL ENDOSCOPY  06/26/03   Dr. Wynelle Bourgeois sliding hiatal hernia with 8 mm tongue of gastric type mucosa at distal esophagus bile in the stomach.    Current Medications: Outpatient Medications Prior to Visit  Medication Sig Dispense Refill   alendronate (FOSAMAX) 70 MG tablet Take 70 mg by mouth once a week.      aspirin 81 MG tablet Take 81 mg by mouth daily.     calcium carbonate (CALTRATE 600) 1500 (600 Ca) MG TABS tablet Take 600 mg of elemental calcium by mouth daily. Plus D3     Cyanocobalamin (B-12 PO) Take 6,000 mcg by mouth daily.     diphenoxylate-atropine (LOMOTIL) 2.5-0.025 MG tablet Take 1 tablet by mouth every 4 (four) hours as needed.     donepezil (ARICEPT) 10 MG tablet Take 1 tablet (10 mg total) by mouth at bedtime. 90 tablet 3   escitalopram (LEXAPRO) 10 MG tablet Take 10 mg by mouth every evening.   0   memantine (NAMENDA) 10 MG tablet Take 1 tablet (10 mg total) by mouth 2 (two) times daily. 180 tablet 3   OVER THE COUNTER MEDICATION Take 1 tablet by mouth daily. cognisense otc supplement     SYNTHROID 88 MCG tablet Take 88 mcg by mouth daily. Except on friday     carvedilol (COREG) 3.125 MG tablet Take 1 tablet (3.125 mg total) by mouth 2 (two) times daily with a meal. 60 tablet 1   losartan (COZAAR) 25 MG tablet Take 1 tablet (25 mg total) by mouth daily. 30 tablet 1   ciprofloxacin (CIPRO) 500 MG tablet Take 1 tablet (500 mg total) by mouth 2 (two) times daily. 6 tablet 0   metroNIDAZOLE (FLAGYL) 250 MG tablet Take 1 tablet (250 mg total) by mouth every 8 (eight) hours. 9 tablet 0   No facility-administered medications prior to visit.       Allergies:   Penicillins, Benadryl [diphenhydramine hcl], Msm [methylsulfonylmethane], Nubain [nalbuphine hcl], Sulfa antibiotics, and Codeine   Social History   Socioeconomic History   Marital status: Married    Spouse name: Not on file   Number of children: Not on file   Years of education: 12   Highest education level: Not on file  Occupational History   Not on file  Social Needs   Financial resource strain: Patient refused   Food insecurity    Worry: Patient refused    Inability: Patient refused   Transportation needs    Medical: Patient refused    Non-medical: Patient refused  Tobacco Use   Smoking status: Current Every Day Smoker    Packs/day: 1.00    Years: 45.00    Pack years: 45.00    Types: Cigarettes  Start date: 09/10/1966   Smokeless tobacco: Never Used   Tobacco comment: 12/21/16 1 PPD  Substance and Sexual Activity   Alcohol use: No    Alcohol/week: 0.0 standard drinks   Drug use: No   Sexual activity: Yes  Lifestyle   Physical activity    Days per week: Patient refused    Minutes per session: Patient refused   Stress: Patient refused  Relationships   Social connections    Talks on phone: Patient refused    Gets together: Patient refused    Attends religious service: Patient refused    Active member of club or organization: Patient refused    Attends meetings of clubs or organizations: Patient refused    Relationship status: Patient refused  Other Topics Concern   Not on file  Social History Narrative   Patient is right handed.   Patient drinks 2 cups of caffeine daily.   Education [employer]     Family History:  The patient's family history includes Angina in her mother; COPD in her father; Cancer in her sister; Cardiomyopathy in her brother; Dementia in her sister; Heart failure in her father; Hypertension in her mother; Migraines in her sister; Pneumonia in an other family member.   Review of Systems:   Please see the  history of present illness.     General:  No chills, fever, night sweats or weight changes.  Cardiovascular:  No chest pain, dyspnea on exertion, edema, orthopnea, palpitations, paroxysmal nocturnal dyspnea. Dermatological: No rash, lesions/masses Respiratory: No cough, dyspnea Urologic: No hematuria, dysuria Abdominal:   No nausea, vomiting, bright red blood per rectum, melena, or hematemesis. Positive for diarrhea (now resolved).  Neurologic:  No visual changes, wkns, changes in mental status. All other systems reviewed and are otherwise negative except as noted above.   Physical Exam:    VS:  BP (!) 150/96    Pulse 74    Temp (!) 97.5 F (36.4 C) (Temporal)    Ht 5\' 2"  (1.575 m)    Wt 114 lb (51.7 kg)    SpO2 98%    BMI 20.85 kg/m    General: Well developed, well nourished,female appearing in no acute distress. Head: Normocephalic, atraumatic, sclera non-icteric, no xanthomas, nares are without discharge.  Neck: No carotid bruits. JVD not elevated.  Lungs: Respirations regular and unlabored, without wheezes or rales.  Heart: Regular rate and rhythm. No S3 or S4.  No murmur, no rubs, or gallops appreciated. Abdomen: Soft, non-tender, non-distended with normoactive bowel sounds. No hepatomegaly. No rebound/guarding. No obvious abdominal masses. Msk:  Strength and tone appear normal for age. No joint deformities or effusions. Extremities: No clubbing or cyanosis. No lower extremity edema.  Distal pedal pulses are 2+ bilaterally. Neuro: Alert and oriented X 3. Moves all extremities spontaneously. No focal deficits noted. Psych:  Responds to questions appropriately with a normal affect. Skin: No rashes or lesions noted  Wt Readings from Last 3 Encounters:  11/19/18 114 lb (51.7 kg)  10/26/18 113 lb 5.1 oz (51.4 kg)  09/02/18 113 lb 6.4 oz (51.4 kg)     Studies/Labs Reviewed:   EKG:  EKG is not ordered today.   Recent Labs: 10/26/2018: ALT 21 10/28/2018: Hemoglobin 11.6; Platelets  359; TSH 1.334 10/29/2018: BUN 5; Creatinine, Ser 1.20; Magnesium 2.0; Potassium 3.5; Sodium 137   Lipid Panel    Component Value Date/Time   CHOL 173 05/09/2013 0345   TRIG 83 05/09/2013 0345   HDL 55 05/09/2013 0345  CHOLHDL 3.1 05/09/2013 0345   VLDL 17 05/09/2013 0345   LDLCALC 101 (H) 05/09/2013 0345    Additional studies/ records that were reviewed today include:   NST: 04/2013 IMPRESSION: This is a low risk scan. There is no scar or ischemia. There is decreased activity in the septum, consistent with left bundle branch block. It is noted that the QRS was narrow at the start of the study. However with Lexiscan, the patient developed tachycardia with left bundle branch block. Assessment shows that this was most probably sinus tachycardia which slowed over time.  Echocardiogram: 10/28/2018 IMPRESSIONS    1. The left ventricle has moderately reduced systolic function, with an ejection fraction of 35-40%. The cavity size was normal. Severe focal basal septal hypertrophy.. Diastolic dysfunction, grade indeterminate. Left ventricular diffuse hypokinesis.  2. The right ventricle has normal systolic function. The cavity was normal. There is no increase in right ventricular wall thickness.  3. The aortic valve is tricuspid. Mild thickening of the aortic valve. Mild aortic annular calcification noted.  4. The mitral valve is grossly normal. Mild thickening of the mitral valve leaflet. Mitral valve regurgitation is mild to moderate by color flow Doppler.  5. The tricuspid valve is grossly normal.  6. The aorta is normal unless otherwise noted.  Assessment:    1. Cardiomyopathy, unspecified type ([place])   2. Syncope, unspecified syncope type   3. Essential hypertension   4. Hypothyroidism, unspecified type      Plan:   In order of problems listed above:  1. New Cardiomyopathy - Echocardiogram during recent admission showed a reduced EF of 35 to 40% with no prior imaging  available for comparison. She denies any recent orthopnea, PND or lower extremity edema and appears euvolemic by examination today. - Reviewed echocardiogram results in detail with the patient and her husband. She is currently on Coreg 3.125 mg twice daily and Losartan 25 mg daily. Will titrate Coreg to 6.25 mg twice daily. They are traveling to the beach in several weeks and will arrange for follow-up upon their return.  Would continue with titration of her medication regimen with plans for a repeat echocardiogram 3 months from now. Reviewed a possible stress test with them as discussed during the admission but they wish to hold off on this for now pending repeat echo results which seems reasonable given she has been asymptomatic.   2. Syncope - Unclear etiology but thought to be secondary to volume depletion and possibly vasovagal in the setting of significant dehydration. An event monitor was ordered given her likelihood for sustained arrhythmias in the setting of her cardiomyopathy but this has not yet been placed. They are going to the beach in 2 weeks and request to have this placed upon their return. Will arrange for a nurse visit to help with monitor placement at that time.  3. HTN - BP is elevated at 150/96 with similar results when rechecked. Will plan to titrate Coreg to 6.25 mg twice daily. I have asked them to follow this at home and report back with readings.  4. Hypothyroidism - TSH 1.334 during admission. Continue Synthroid at current dosing.    Medication Adjustments/Labs and Tests Ordered: Current medicines are reviewed at length with the patient today.  Concerns regarding medicines are outlined above.  Medication changes, Labs and Tests ordered today are listed in the Patient Instructions below. Patient Instructions  Medication Instructions:  Your physician recommends that you continue on your current medications as directed. Please refer to the  Current Medication list given to  you today.  If you need a refill on your cardiac medications before your next appointment, please call your pharmacy.   Lab work: NONE  If you have labs (blood work) drawn today and your tests are completely normal, you will receive your results only by:  [place] (if you have MyChart) OR  A paper copy in the mail If you have any lab test that is abnormal or we need to change your treatment, we will call you to review the results.  Testing/Procedures: NONE    Follow-Up: At [facility], you and your health needs are our priority.  As part of our continuing mission to provide you with exceptional heart care, we have created designated Provider Care Teams.  These Care Teams include your primary Cardiologist (physician) and Advanced Practice Providers (APPs -  Physician Assistants and Nurse Practitioners) who all work together to provide you with the care you need, when you need it. You will need a follow up appointment in 2 months.  Please call our office 2 months in advance to schedule this appointment.  You may see Carlyle Dolly, MD or one of the following Advanced Practice Providers on your designated Care Team:   Bernerd Pho, PA-C ([place])  Ermalinda Barrios, PA-C [facility])  Any Other Special Instructions Will Be Listed Below (If Applicable). Thank you for choosing [place]!      Signed, Erma Heritage, PA-C  11/19/2018 5:49 PM    [place] [address] [place], [place] [address] Phone: [phone] Fax: [phone]

## 2018-11-20 DIAGNOSIS — R944 Abnormal results of kidney function studies: Secondary | ICD-10-CM | POA: Diagnosis not present

## 2018-12-06 DIAGNOSIS — R55 Syncope and collapse: Secondary | ICD-10-CM

## 2019-01-04 ENCOUNTER — Ambulatory Visit (INDEPENDENT_AMBULATORY_CARE_PROVIDER_SITE_OTHER): Payer: Medicare HMO

## 2019-01-04 DIAGNOSIS — R55 Syncope and collapse: Secondary | ICD-10-CM

## 2019-01-13 ENCOUNTER — Other Ambulatory Visit: Payer: Self-pay

## 2019-01-13 ENCOUNTER — Other Ambulatory Visit: Payer: Self-pay | Admitting: *Deleted

## 2019-01-13 DIAGNOSIS — R55 Syncope and collapse: Secondary | ICD-10-CM

## 2019-01-13 NOTE — Progress Notes (Signed)
Referring Provider: Sharilyn Sites, MD Primary Care Physician:  Sharilyn Sites, MD Primary GI: Dr. Gala Romney   Chief Complaint  Patient presents with  . Diarrhea    watery stool    HPI:   YATASHA GRADO is a 68 y.o. female presenting today in hospital follow-up, whereby she was admitted with an acute onset of diarrhea shortly after eating at a local restaurant in Sept 2020, requiring hospitalization as she had failed outpatient supportive therapy. Cdiff negative, GI pathogen panel negative. Empirically treated with antibiotic therapy. TSH normal. Last colonoscopy in 2017 with sigmoid diverticulosis, otherwise normal. Baseline bowel habits at least 1 BM a day but "nothing terrible".   8-10 loose stools per day. Postprandial. Fair appetite. Similar to at discharge. Has never been a big breakfast eater. Eats small amounts. No abdominal pain. Lots of growling. While hospitalized, she had up to 20 loose stools per day. Frequency is same as it was at discharge. No rectal bleeding. [place] stool scale #7. Will take Lomotil occasionally just a few times a week. Not taking Lomotil daily.   History of Cdiff in Jan 2020. Episode of feeling weak while standing at sink recently. Barely drinks any water per husband. Recently completed heart monitor and will be seeing cardiology later today.   Past Medical History:  Diagnosis Date  . Atrial fibrillation ([place])   . Bloating   . Complication of anesthesia     " hard to wake up sometimes "  . Dementia ([place])   . Diverticula of colon   . Epigastric burning sensation   . GERD (gastroesophageal reflux disease)   . Headache disorder 06/03/2014  . Headache(784.0)   . Helicobacter pylori gastritis 2004  . Hemorrhoids 06/26/03   Dr. Laural Golden tcs  . Hiatal hernia 06/26/03   Dr. Laural Golden egd  . Hypertension   . Hypothyroidism   . 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 Memory difficulty 06/03/2014  . Shoulder fracture, left   . Spastic  colon   . Wrist fracture     Past Surgical History:  Procedure Laterality Date  . APPENDECTOMY    . CHOLECYSTECTOMY    . COLONOSCOPY  06/26/03   Dr. Kem Boroughs diverticula at the sigmoid colon with one of the transverse colon, normal terminal ileoscopy, small external hemorrhoids the  . COLONOSCOPY N/A 01/20/2016   sigmoid diverticulosis, otherwise normal.   . ESOPHAGOGASTRODUODENOSCOPY  03/15/2011   Dr. Trevor Iha hiatal hernia, abnormal gastric mucosa of unclear significance. Gastric biopsies negative, no H. pylori.Procedure: ESOPHAGOGASTRODUODENOSCOPY (EGD);  Surgeon: Daneil Dolin, MD;  [facility];  Service: Endoscopy;  Laterality: N/A;  7:30  . ORIF HUMERUS FRACTURE Left 05/29/2016   Procedure: LEFT OPEN REDUCTION INTERNAL FIXATION (ORIF) PROXIMAL HUMERUS FRACTURE;  Surgeon: Meredith Pel, MD;  Location: [facility];  Service: Orthopedics;  Laterality: Left;  . ORIF WRIST FRACTURE Left 05/29/2016   Procedure: OPEN REDUCTION INTERNAL FIXATION (ORIF) LEFT WRIST FRACTURE;  Surgeon: Meredith Pel, MD;  Location: [facility];  Service: Orthopedics;  Laterality: Left;  . TUBAL LIGATION    . UPPER GASTROINTESTINAL ENDOSCOPY  06/26/03   Dr. Wynelle Bourgeois sliding hiatal hernia with 8 mm tongue of gastric type mucosa at distal esophagus bile in the stomach.    Current Outpatient Medications  Medication Sig Dispense Refill  . alendronate (FOSAMAX) 70 MG tablet Take 70 mg by mouth once a week.     Marland Kitchen aspirin 81 MG tablet  Take 81 mg by mouth daily.    . calcium carbonate (CALTRATE 600) 1500 (600 Ca) MG TABS tablet Take 600 mg of elemental calcium by mouth daily. Plus D3    . carvedilol (COREG) 6.25 MG tablet Take 1 tablet (6.25 mg total) by mouth 2 (two) times daily. 180 tablet 3  . Cyanocobalamin (B-12 PO) Take 6,000 mcg by mouth daily.    . diphenoxylate-atropine (LOMOTIL) 2.5-0.025 MG tablet Take 1 tablet by mouth every 4 (four) hours as needed.    . donepezil (ARICEPT) 10 MG  tablet Take 1 tablet (10 mg total) by mouth at bedtime. 90 tablet 3  . escitalopram (LEXAPRO) 10 MG tablet Take 10 mg by mouth every evening.   0  . losartan (COZAAR) 25 MG tablet Take 1 tablet (25 mg total) by mouth daily. 90 tablet 3  . memantine (NAMENDA) 10 MG tablet Take 1 tablet (10 mg total) by mouth 2 (two) times daily. 180 tablet 3  . OVER THE COUNTER MEDICATION Take 1 tablet by mouth daily. cognisense otc supplement    . pyridostigmine (MESTINON) 60 MG tablet Take 30 mg by mouth daily.    Marland Kitchen SYNTHROID 88 MCG tablet Take 88 mcg by mouth daily. Except on friday     No current facility-administered medications for this visit.     Allergies as of 01/14/2019 - Review Complete 01/14/2019  Allergen Reaction Noted  . Penicillins Hives and Swelling 07/28/2010  . Benadryl [diphenhydramine hcl] Hives 12/02/2010  . Msm [methylsulfonylmethane] Hives 06/02/2014  . Nubain [nalbuphine hcl] Hives 06/02/2014  . Sulfa antibiotics Hives 07/28/2010  . Codeine  07/28/2010    Family History  Problem Relation Age of Onset  . Hypertension Mother   . Angina Mother   . COPD Father   . Heart failure Father   . Cancer Sister   . Dementia Sister   . Cardiomyopathy Brother   . Pneumonia Other   . Migraines Sister   . Colon cancer Neg Hx     Social History   Socioeconomic History  . Marital status: Married    Spouse name: Not on file  . Number of children: Not on file  . Years of education: 12  . Highest education level: Not on file  Occupational History  . Not on file  Social Needs  . Financial resource strain: Patient refused  . Food insecurity    Worry: Patient refused    Inability: Patient refused  . Transportation needs    Medical: Patient refused    Non-medical: Patient refused  Tobacco Use  . Smoking status: Current Every Day Smoker    Packs/day: 1.00    Years: 45.00    Pack years: 45.00    Types: Cigarettes    Start date: 09/10/1966  . Smokeless tobacco: Never Used  .  Tobacco comment: 12/21/16 1 PPD  Substance and Sexual Activity  . Alcohol use: No    Alcohol/week: 0.0 standard drinks  . Drug use: No  . Sexual activity: Yes  Lifestyle  . Physical activity    Days per week: Patient refused    Minutes per session: Patient refused  . Stress: Patient refused  Relationships  . Social Herbalist on phone: Patient refused    Gets together: Patient refused    Attends religious service: Patient refused    Active member of club or organization: Patient refused    Attends meetings of clubs or organizations: Patient refused    Relationship status: Patient  refused  Other Topics Concern  . Not on file  Social History Narrative   Patient is right handed.   Patient drinks 2 cups of caffeine daily.   Education [employer]    Review of Systems: Gen: see HPI CV: see HPI  Resp: Denies dyspnea at rest, cough, wheezing, coughing up blood, and pleurisy. GI: see HPI Derm: Denies rash, itching, dry skin Psych: Denies depression, anxiety, memory loss, confusion. No homicidal or suicidal ideation.  Heme: see HPI  Physical Exam: BP 110/64   Pulse 64   Temp (!) 96.6 F (35.9 C) (Temporal)   Ht 5\' 2"  (1.575 m)   Wt 108 lb 12.8 oz (49.4 kg)   BMI 19.90 kg/m  General:   Alert and oriented. No distress noted. Pleasant and cooperative.  Head:  Normocephalic and atraumatic. Lungs: clear bilaterally Cardiac: S1 S2 present without murmurs Abdomen:  +BS, soft, non-tender and non-distended. No rebound or guarding. No HSM or masses noted. Msk:  Symmetrical without gross deformities. Normal posture. Extremities:  Without edema. Neurologic:  Alert and  oriented x4 Psych:  Alert and cooperative. Normal mood and affect.  ASSESSMENT: SHANINE BAZEN is a 68 y.o. female presenting today with history of Cdiff in Jan 2020 s/p treatment, and most recently hospitalized in Sept 2020 after acute onset of diarrhea that had failed to respond to supportive outpatient  treatment after eating at the [employer]. During hospitalization, she had negative Cdiff and GI pathogen panel. Improvement in frequency by discharge; however, she continues with 8-10 watery stools per day, similar to discharge. 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 However, history of Cdiff is concerning. Unclear if Cdiff sample was adequate during hospitalization OR if was on empiric antibiotic therapy already, which could possibly skew results.   Will repeat Cdiff now. 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 Will provide Lomotil to take as needed once stool studies returned. If no improvement with supportive measures and negative stool study, will arrange colonoscopy with random colonic biopsies.    PLAN:  Cdiff today Lomotil prescription for supportive measures if Cdiff negative Colonoscopy if no improvement with supportive measures Return in 3 months   Annitta Needs, PhD, [facility]

## 2019-01-14 ENCOUNTER — Encounter: Payer: Self-pay | Admitting: Internal Medicine

## 2019-01-14 ENCOUNTER — Other Ambulatory Visit: Payer: Self-pay

## 2019-01-14 ENCOUNTER — Ambulatory Visit: Payer: Medicare HMO | Admitting: Gastroenterology

## 2019-01-14 ENCOUNTER — Encounter: Payer: Self-pay | Admitting: Gastroenterology

## 2019-01-14 VITALS — BP 110/64 | HR 64 | Temp 96.6°F | Ht 62.0 in | Wt 108.8 lb

## 2019-01-14 DIAGNOSIS — R197 Diarrhea, unspecified: Secondary | ICD-10-CM | POA: Diagnosis not present

## 2019-01-14 MED ORDER — DIPHENOXYLATE-ATROPINE 2.5-0.025 MG PO TABS: 1.0000 | ORAL_TABLET | ORAL | 3 refills | Status: DC | PRN

## 2019-01-14 NOTE — Patient Instructions (Signed)
Please complete the stool study.   I have refilled Lomotil. Let's take this each morning and repeat in the afternoon if needed.   We will see what the stool study shows. If you do not have any improvement with Lomotil and we have ruled out Cdiff, we will pursue a colonoscopy with biopsies.  Drink plenty of water to keep urine clear.   We will see you in 3 months regardless!  I enjoyed seeing you again today! As you know, I value our relationship and want to provide genuine, compassionate, and quality care. I welcome your feedback. If you receive a survey regarding your visit,  I greatly appreciate you taking time to fill this out. See you next time!  Annitta Needs, PhD, ANP-BC [facility]

## 2019-01-16 DIAGNOSIS — R197 Diarrhea, unspecified: Secondary | ICD-10-CM | POA: Diagnosis not present

## 2019-01-17 LAB — C. DIFFICILE GDH AND TOXIN A/B
GDH ANTIGEN: NOT DETECTED
MICRO NUMBER:: 1161940
SPECIMEN QUALITY:: ADEQUATE
TOXIN A AND B: NOT DETECTED

## 2019-01-20 NOTE — Progress Notes (Signed)
Cdiff is negative. How is she doing with Lomotil?

## 2019-01-21 NOTE — Progress Notes (Signed)
She can take Lomotil every 4 hours as needed. Would increase to three times per day for now. No more than 8 tablets per day. Please have her call next week with an update. If no improvement, we will pursue colonoscopy.

## 2019-01-24 ENCOUNTER — Encounter: Payer: Self-pay | Admitting: Cardiology

## 2019-01-24 ENCOUNTER — Telehealth (INDEPENDENT_AMBULATORY_CARE_PROVIDER_SITE_OTHER): Payer: Medicare HMO | Admitting: Cardiology

## 2019-01-24 VITALS — BP 114/75 | Ht 61.0 in | Wt 108.0 lb

## 2019-01-24 DIAGNOSIS — R55 Syncope and collapse: Secondary | ICD-10-CM | POA: Diagnosis not present

## 2019-01-24 DIAGNOSIS — I5022 Chronic systolic (congestive) heart failure: Secondary | ICD-10-CM

## 2019-01-24 MED ORDER — LOSARTAN POTASSIUM 25 MG PO TABS: 37.5000 mg | ORAL_TABLET | Freq: Every day | ORAL | 3 refills | Status: DC

## 2019-01-24 NOTE — Patient Instructions (Signed)
Medication Instructions:  INCREASE LOSARTAN TO 37.5 MG DAILY (1 1/2 Tablets)   Labwork: none  Testing/Procedures: none  Follow-Up: Your physician recommends that you schedule a follow-up appointment in: 1 month   Any Other Special Instructions Will Be Listed Below (If Applicable).     If you need a refill on your cardiac medications before your next appointment, please call your pharmacy.

## 2019-01-24 NOTE — Progress Notes (Signed)
Virtual Visit via Telephone Note   This visit type was conducted due to national recommendations for restrictions regarding the COVID-19 Pandemic (e.g. social distancing) in an effort to limit this patient's exposure and mitigate transmission in our community.  Due to her co-morbid illnesses, this patient is at least at moderate risk for complications without adequate follow up.  This format is felt to be most appropriate for this patient at this time.  The patient did not have access to video technology/had technical difficulties with video requiring transitioning to audio format only (telephone).  All issues noted in this document were discussed and addressed.  No physical exam could be performed with this format.  Please refer to the patient's chart for her  consent to telehealth for [facility].   Date:  01/24/2019   ID:  Melanie Bradley, DOB 21-Feb-1950, MRN [id number]  Patient Location: Home Provider Location: Office  PCP:  Sharilyn Sites, MD  Cardiologist:  Carlyle Dolly, MD  Electrophysiologist:  None   Evaluation Performed:  Follow-Up Visit  Chief Complaint:  Follow up  History of Present Illness:    Melanie Bradley is a 68 y.o. female seen today for follow up of the following medical problems.    1. Chest pain - 04/2013 MPI no ischemia - describes some occasional epigastric discomfort, fullness since last visit. Can occur at rest or with exertion, often with bending over. Can sometimes improve with prn xanax.    - no recent symptoms  2. Rate related LBBB - noted on prior cardiac testing, tachycardia induced after Lexiscan inhection with noted LBBB. Imaging was negative for ischemia.     3. Memory deficit - on aricept, followed by neuro   4. Syncope - admitted 10/2018 with syncope - had been having significant diarrhea around that time. Episode thought to be due to hypovolemia - 12/2018 monitor no significant arrhythmias  - no repeat episodes.    5. Chronic systolic HF - new diagnosis during 10/2018 admission with syncope. Echo showed LVEF 35-40% - no recent SOb/DOE. Compliant with meds - bp is 114/75, HR 64 today     The patient does not have symptoms concerning for COVID-19 infection (fever, chills, cough, or new shortness of breath).    Past Medical History:  Diagnosis Date  . Atrial fibrillation ([place])   . Bloating   . Complication of anesthesia     " hard to wake up sometimes "  . Dementia ([place])   . Diverticula of colon   . Epigastric burning sensation   . GERD (gastroesophageal reflux disease)   . Headache disorder 06/03/2014  . Headache(784.0)   . Helicobacter pylori gastritis 2004  . Hemorrhoids 06/26/03   Dr. Laural Golden tcs  . Hiatal hernia 06/26/03   Dr. Laural Golden egd  . Hypertension   . Hypothyroidism   . 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 Memory difficulty 06/03/2014  . Shoulder fracture, left   . Spastic colon   . Wrist fracture    Past Surgical History:  Procedure Laterality Date  . APPENDECTOMY    . CHOLECYSTECTOMY    . COLONOSCOPY  06/26/03   Dr. Kem Boroughs diverticula at the sigmoid colon with one of the transverse colon, normal terminal ileoscopy, small external hemorrhoids the  . COLONOSCOPY N/A 01/20/2016   sigmoid diverticulosis, otherwise normal.   . ESOPHAGOGASTRODUODENOSCOPY  03/15/2011   Dr. Trevor Iha hiatal hernia, abnormal gastric mucosa of unclear significance. Gastric biopsies negative, no H. pylori.Procedure:  ESOPHAGOGASTRODUODENOSCOPY (EGD);  Surgeon: Daneil Dolin, MD;  [facility];  Service: Endoscopy;  Laterality: N/A;  7:30  . ORIF HUMERUS FRACTURE Left 05/29/2016   Procedure: LEFT OPEN REDUCTION INTERNAL FIXATION (ORIF) PROXIMAL HUMERUS FRACTURE;  Surgeon: Meredith Pel, MD;  Location: [facility];  Service: Orthopedics;  Laterality: Left;  . ORIF WRIST FRACTURE Left 05/29/2016   Procedure: OPEN REDUCTION INTERNAL FIXATION (ORIF) LEFT WRIST  FRACTURE;  Surgeon: Meredith Pel, MD;  Location: [facility];  Service: Orthopedics;  Laterality: Left;  . TUBAL LIGATION    . UPPER GASTROINTESTINAL ENDOSCOPY  06/26/03   Dr. Wynelle Bourgeois sliding hiatal hernia with 8 mm tongue of gastric type mucosa at distal esophagus bile in the stomach.     Current Meds  Medication Sig  . alendronate (FOSAMAX) 70 MG tablet Take 70 mg by mouth once a week.   Marland Kitchen aspirin 81 MG tablet Take 81 mg by mouth daily.  . calcium carbonate (CALTRATE 600) 1500 (600 Ca) MG TABS tablet Take 600 mg of elemental calcium by mouth daily. Plus D3  . carvedilol (COREG) 6.25 MG tablet Take 1 tablet (6.25 mg total) by mouth 2 (two) times daily.  . Cyanocobalamin (B-12 PO) Take 6,000 mcg by mouth daily.  . diphenoxylate-atropine (LOMOTIL) 2.5-0.025 MG tablet Take 1 tablet by mouth every 4 (four) hours as needed. For diarrhea  . donepezil (ARICEPT) 10 MG tablet Take 1 tablet (10 mg total) by mouth at bedtime.  Marland Kitchen escitalopram (LEXAPRO) 10 MG tablet Take 10 mg by mouth every evening.   Marland Kitchen losartan (COZAAR) 25 MG tablet Take 1 tablet (25 mg total) by mouth daily.  . memantine (NAMENDA) 10 MG tablet Take 1 tablet (10 mg total) by mouth 2 (two) times daily.  Marland Kitchen OVER THE COUNTER MEDICATION Take 1 tablet by mouth daily. cognisense otc supplement  . pyridostigmine (MESTINON) 60 MG tablet Take 30 mg by mouth daily.  Marland Kitchen SYNTHROID 88 MCG tablet Take 88 mcg by mouth daily. Except on friday     Allergies:   Penicillins, Benadryl [diphenhydramine hcl], Msm [methylsulfonylmethane], Nubain [nalbuphine hcl], Sulfa antibiotics, and Codeine   Social History   Tobacco Use  . Smoking status: Current Every Day Smoker    Packs/day: 1.00    Years: 45.00    Pack years: 45.00    Types: Cigarettes    Start date: 09/10/1966  . Smokeless tobacco: Never Used  . Tobacco comment: 12/21/16 1 PPD  Substance Use Topics  . Alcohol use: No    Alcohol/week: 0.0 standard drinks  . Drug use: No     Family  Hx: The patient's family history includes Angina in her mother; COPD in her father; Cancer in her sister; Cardiomyopathy in her brother; Dementia in her sister; Heart failure in her father; Hypertension in her mother; Migraines in her sister; Pneumonia in an other family member. There is no history of Colon cancer.  ROS:   Please see the history of present illness.     All other systems reviewed and are negative.   Prior CV studies:   The following studies were reviewed today:  04/2013 Lexiscan MPI FINDINGS:  The patient's initial EKG revealed a narrow QRS. After she received  Lexiscan, the heart rate increased to 145 and the QRS widened to a  left bundle Chantia Amalfitano block. The increased rate persisted for several  minutes but then began to slow. When the heart rate was slower, P  waves could be seen. There were no  flutter waves. The raw data  reveals no evidence of excess motion. Tomographic images with stress  reveal a small area of moderate decreased uptake in the septum. Rest  images reveal a moderate area of decreased uptake in the septum.  These findings are most consistent with left bundle Mattilynn Forrer block.  Wall motion analysis reveals excellent motion with an ejection  fraction of 70%.  IMPRESSION:  This is a low risk scan. There is no scar or ischemia. There is  decreased activity in the septum, consistent with left bundle Bobbyjoe Pabst  block. It is noted that the QRS was narrow at the start of the  study. However with Lexiscan, the patient developed tachycardia with  left bundle Whitney Bingaman block. Assessment shows that this was most  probably sinus tachycardia which slowed over time.  10/2018 echo IMPRESSIONS    1. The left ventricle has moderately reduced systolic function, with an ejection fraction of 35-40%. The cavity size was normal. Severe focal basal septal hypertrophy.. Diastolic dysfunction, grade indeterminate. Left ventricular diffuse hypokinesis.  2. The  right ventricle has normal systolic function. The cavity was normal. There is no increase in right ventricular wall thickness.  3. The aortic valve is tricuspid. Mild thickening of the aortic valve. Mild aortic annular calcification noted.  4. The mitral valve is grossly normal. Mild thickening of the mitral valve leaflet. Mitral valve regurgitation is mild to moderate by color flow Doppler.  5. The tricuspid valve is grossly normal.  6. The aorta is normal unless otherwise noted.  12/2018 30 day event monitor  30 day event monitor  Min HR 57, Max HR 130, Avg HR 79  No symptoms reported  Telemetry tracings show sinus rhythm with occasional PVCs.  No significant arrhythmias  Labs/Other Tests and Data Reviewed:    EKG:  No ECG reviewed.  Recent Labs: 10/26/2018: ALT 21 10/28/2018: Hemoglobin 11.6; Platelets 359; TSH 1.334 10/29/2018: BUN 5; Creatinine, Ser 1.20; Magnesium 2.0; Potassium 3.5; Sodium 137   Recent Lipid Panel Lab Results  Component Value Date/Time   CHOL 173 05/09/2013 03:45 AM   TRIG 83 05/09/2013 03:45 AM   HDL 55 05/09/2013 03:45 AM   CHOLHDL 3.1 05/09/2013 03:45 AM   LDLCALC 101 (H) 05/09/2013 03:45 AM    Wt Readings from Last 3 Encounters:  01/24/19 108 lb (49 kg)  01/14/19 108 lb 12.8 oz (49.4 kg)  11/19/18 114 lb (51.7 kg)     Objective:    Vital Signs:  BP 114/75   Ht 5\' 1"  (1.549 m)   Wt 108 lb (49 kg)   BMI 20.41 kg/m    Normal affect. Normal speech pattern and tone. Comfortable, no apparent distress. No audible signs of SOB or wheezing.   ASSESSMENT & PLAN:    1. Syncope - occurred in setting of severe diarrhea and hypovolemia, no recurrence - benign cardiac monitor - no further cardiac testing  2. Chronic systolic HF - new diagnosis during 10/2018 admission - no recent symptoms - increase losartan to 37.5mg  daily - titrating meds as of now, repeat echo likely after next visit. If ongoign dysfunction will need to reconsider ischemic  testing and further medication titration.   COVID-19 Education: The signs and symptoms of COVID-19 were discussed with the patient and how to seek care for testing (follow up with PCP or arrange E-visit).  The importance of social distancing was discussed today.  Time:   Today, I have spent 14 minutes with the patient with telehealth technology discussing  the above problems.     Medication Adjustments/Labs and Tests Ordered: Current medicines are reviewed at length with the patient today.  Concerns regarding medicines are outlined above.   Tests Ordered: No orders of the defined types were placed in this encounter.   Medication Changes: No orders of the defined types were placed in this encounter.   Follow Up:  Virtual Visit  in 1 month(s)  Signed, Carlyle Dolly, MD  01/24/2019 11:07 AM

## 2019-01-24 NOTE — Addendum Note (Signed)
Addended by: Debbora Lacrosse R on: 01/24/2019 11:28 AM   Modules accepted: Orders

## 2019-01-30 ENCOUNTER — Other Ambulatory Visit: Payer: Self-pay | Admitting: Adult Health

## 2019-02-25 ENCOUNTER — Encounter: Payer: Self-pay | Admitting: Cardiology

## 2019-02-25 ENCOUNTER — Telehealth (INDEPENDENT_AMBULATORY_CARE_PROVIDER_SITE_OTHER): Payer: Medicare HMO | Admitting: Cardiology

## 2019-02-25 VITALS — BP 138/97 | HR 64 | Ht 62.0 in | Wt 110.0 lb

## 2019-02-25 DIAGNOSIS — I5022 Chronic systolic (congestive) heart failure: Secondary | ICD-10-CM | POA: Diagnosis not present

## 2019-02-25 NOTE — Patient Instructions (Signed)
Medication Instructions:  Your physician recommends that you continue on your current medications as directed. Please refer to the Current Medication list given to you today.    Labwork: NONE  Testing/Procedures: Your physician has requested that you have an echocardiogram. Echocardiography is a painless test that uses sound waves to create images of your heart. It provides your doctor with information about the size and shape of your heart and how well your heart's chambers and valves are working. This procedure takes approximately one hour. There are no restrictions for this procedure.    Follow-Up: Your physician recommends that you schedule a follow-up appointment in: TO BE DETERMINED BASED ON TEST RESULT   Any Other Special Instructions Will Be Listed Below (If Applicable).     If you need a refill on your cardiac medications before your next appointment, please call your pharmacy.

## 2019-02-25 NOTE — Progress Notes (Signed)
Virtual Visit via Telephone Note   This visit type was conducted due to national recommendations for restrictions regarding the COVID-19 Pandemic (e.g. social distancing) in an effort to limit this patient's exposure and mitigate transmission in our community.  Due to her co-morbid illnesses, this patient is at least at moderate risk for complications without adequate follow up.  This format is felt to be most appropriate for this patient at this time.  The patient did not have access to video technology/had technical difficulties with video requiring transitioning to audio format only (telephone).  All issues noted in this document were discussed and addressed.  No physical exam could be performed with this format.  Please refer to the patient's chart for her  consent to telehealth for [facility].   Date:  02/25/2019   ID:  Melanie Bradley, DOB [birth year], MRN [id number]  Patient Location: Home Provider Location: Office  PCP:  Sharilyn Sites, MD  Cardiologist:  Carlyle Dolly, MD  Electrophysiologist:  None   Evaluation Performed:  Follow-Up Visit  Chief Complaint:  Follow up visit  History of Present Illness:    Melanie Bradley is a 69 y.o. female seen today for follow up of the following medical problems.  This is a focused visit on her history of chronic systolic HF, for more detailed history please refer to prior clinic notes.     1. Chronic systolic HF - new diagnosis during 10/2018 admission with syncope. Echo showed LVEF 35-40% - no recent SOb/DOE. Compliant with meds - bp is 114/75, HR 64 today  - last visit we increased losartan to 37.5mg  daily. Denies any recent side effects - compliant with meds - no recent edema. No recent edema     The patient does not have symptoms concerning for COVID-19 infection (fever, chills, cough, or new shortness of breath).    Past Medical History:  Diagnosis Date  . Atrial fibrillation ([place])   . Bloating   .  Complication of anesthesia     " hard to wake up sometimes "  . Dementia ([place])   . Diverticula of colon   . Epigastric burning sensation   . GERD (gastroesophageal reflux disease)   . Headache disorder 06/03/2014  . Headache(784.0)   . Helicobacter pylori gastritis 2004  . Hemorrhoids 06/26/03   Dr. Laural Golden tcs  . Hiatal hernia 06/26/03   Dr. Laural Golden egd  . Hypertension   . Hypothyroidism   . 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 Memory difficulty 06/03/2014  . Shoulder fracture, left   . Spastic colon   . Wrist fracture    Past Surgical History:  Procedure Laterality Date  . APPENDECTOMY    . CHOLECYSTECTOMY    . COLONOSCOPY  06/26/03   Dr. Kem Boroughs diverticula at the sigmoid colon with one of the transverse colon, normal terminal ileoscopy, small external hemorrhoids the  . COLONOSCOPY N/A 01/20/2016   sigmoid diverticulosis, otherwise normal.   . ESOPHAGOGASTRODUODENOSCOPY  03/15/2011   Dr. Trevor Iha hiatal hernia, abnormal gastric mucosa of unclear significance. Gastric biopsies negative, no H. pylori.Procedure: ESOPHAGOGASTRODUODENOSCOPY (EGD);  Surgeon: Daneil Dolin, MD;  [facility];  Service: Endoscopy;  Laterality: N/A;  7:30  . ORIF HUMERUS FRACTURE Left 05/29/2016   Procedure: LEFT OPEN REDUCTION INTERNAL FIXATION (ORIF) PROXIMAL HUMERUS FRACTURE;  Surgeon: Meredith Pel, MD;  Location: [place];  Service: Orthopedics;  Laterality: Left;  . ORIF WRIST FRACTURE Left 05/29/2016   Procedure:  OPEN REDUCTION INTERNAL FIXATION (ORIF) LEFT WRIST FRACTURE;  Surgeon: Meredith Pel, MD;  Location: [place];  Service: Orthopedics;  Laterality: Left;  . TUBAL LIGATION    . UPPER GASTROINTESTINAL ENDOSCOPY  06/26/03   Dr. Wynelle Bourgeois sliding hiatal hernia with 8 mm tongue of gastric type mucosa at distal esophagus bile in the stomach.     No outpatient medications have been marked as taking for the 02/25/19 encounter (Appointment) with  Arnoldo Lenis, MD.     Allergies:   Penicillins, Benadryl [diphenhydramine hcl], Msm [methylsulfonylmethane], Nubain [nalbuphine hcl], Sulfa antibiotics, and Codeine   Social History   Tobacco Use  . Smoking status: Current Every Day Smoker    Packs/day: 1.00    Years: 45.00    Pack years: 45.00    Types: Cigarettes    Start date: 09/10/1966  . Smokeless tobacco: Never Used  . Tobacco comment: 12/21/16 1 PPD  Substance Use Topics  . Alcohol use: No    Alcohol/week: 0.0 standard drinks  . Drug use: No     Family Hx: The patient's family history includes Angina in her mother; COPD in her father; Cancer in her sister; Cardiomyopathy in her brother; Dementia in her sister; Heart failure in her father; Hypertension in her mother; Migraines in her sister; Pneumonia in an other family member. There is no history of Colon cancer.  ROS:   Please see the history of present illness.     All other systems reviewed and are negative.   Prior CV studies:   The following studies were reviewed today:  04/2013 Lexiscan MPI FINDINGS:  The patient's initial EKG revealed a narrow QRS. After she received  Lexiscan, the heart rate increased to 145 and the QRS widened to a  left bundle Jenean Escandon block. The increased rate persisted for several  minutes but then began to slow. When the heart rate was slower, P  waves could be seen. There were no flutter waves. The raw data  reveals no evidence of excess motion. Tomographic images with stress  reveal a small area of moderate decreased uptake in the septum. Rest  images reveal a moderate area of decreased uptake in the septum.  These findings are most consistent with left bundle Zakary Kimura block.  Wall motion analysis reveals excellent motion with an ejection  fraction of 70%.  IMPRESSION:  This is a low risk scan. There is no scar or ischemia. There is  decreased activity in the septum, consistent with left bundle Saivon Prowse  block. It  is noted that the QRS was narrow at the start of the  study. However with Lexiscan, the patient developed tachycardia with  left bundle Valaree Fresquez block. Assessment shows that this was most  probably sinus tachycardia which slowed over time.  10/2018 echo IMPRESSIONS   1. The left ventricle has moderately reduced systolic function, with an ejection fraction of 35-40%. The cavity size was normal. Severe focal basal septal hypertrophy.. Diastolic dysfunction, grade indeterminate. Left ventricular diffuse hypokinesis. 2. The right ventricle has normal systolic function. The cavity was normal. There is no increase in right ventricular wall thickness. 3. The aortic valve is tricuspid. Mild thickening of the aortic valve. Mild aortic annular calcification noted. 4. The mitral valve is grossly normal. Mild thickening of the mitral valve leaflet. Mitral valve regurgitation is mild to moderate by color flow Doppler. 5. The tricuspid valve is grossly normal. 6. The aorta is normal unless otherwise noted.  12/2018 30 day event monitor  30 day  event monitor  Min HR 57, Max HR 130, Avg HR 79  No symptoms reported  Telemetry tracings show sinus rhythm with occasional PVCs.  No significant arrhythmias  Labs/Other Tests and Data Reviewed:    EKG:  No ECG reviewed.  Recent Labs: 10/26/2018: ALT 21 10/28/2018: Hemoglobin 11.6; Platelets 359; TSH 1.334 10/29/2018: BUN 5; Creatinine, Ser 1.20; Magnesium 2.0; Potassium 3.5; Sodium 137   Recent Lipid Panel Lab Results  Component Value Date/Time   CHOL 173 05/09/2013 03:45 AM   TRIG 83 05/09/2013 03:45 AM   HDL 55 05/09/2013 03:45 AM   CHOLHDL 3.1 05/09/2013 03:45 AM   LDLCALC 101 (H) 05/09/2013 03:45 AM    Wt Readings from Last 3 Encounters:  01/24/19 108 lb (49 kg)  01/14/19 108 lb 12.8 oz (49.4 kg)  11/19/18 114 lb (51.7 kg)     Objective:    Vital Signs:   Today's Vitals   02/25/19 1245  BP: (!) 138/97  Pulse: 64    Weight: 110 lb (49.9 kg)  Height: 5\' 2"  (1.575 m)   Body mass index is 20.12 kg/m. Normal affect. Normal speech pattern and tone. Comfortable, no apparent distress. No audible signs of SOB or wheezing.   ASSESSMENT & PLAN:    1 Chronic systolic HF - new diagnosis during 10/2018 admission - denies any symptoms - repeat echo, if persistent dysfunction continue titrating meds and consider ischemic testing. Would need to get a good grasp on her functional status with her dementia prior to proceeding with cath.     COVID-19 Education: The signs and symptoms of COVID-19 were discussed with the patient and how to seek care for testing (follow up with PCP or arrange E-visit).  The importance of social distancing was discussed today.  Time:   Today, I have spent 13 minutes with the patient with telehealth technology discussing the above problems.     Medication Adjustments/Labs and Tests Ordered: Current medicines are reviewed at length with the patient today.  Concerns regarding medicines are outlined above.   Tests Ordered: No orders of the defined types were placed in this encounter.   Medication Changes: No orders of the defined types were placed in this encounter.   Follow Up:  Pending echo results  Signed, Carlyle Dolly, MD  02/25/2019 10:38 AM    [place]

## 2019-02-25 NOTE — Addendum Note (Signed)
Addended by: Debbora Lacrosse R on: 02/25/2019 03:11 PM   Modules accepted: Orders

## 2019-02-26 ENCOUNTER — Other Ambulatory Visit: Payer: Self-pay

## 2019-02-26 ENCOUNTER — Ambulatory Visit (HOSPITAL_COMMUNITY)
Admission: RE | Admit: 2019-02-26 | Discharge: 2019-02-26 | Disposition: A | Discharge status: Home / Self Care | Payer: Medicare HMO | Source: Ambulatory Visit | Attending: Cardiology | Admitting: Cardiology

## 2019-02-26 DIAGNOSIS — I5022 Chronic systolic (congestive) heart failure: Secondary | ICD-10-CM | POA: Diagnosis not present

## 2019-02-26 NOTE — Progress Notes (Signed)
*  PRELIMINARY RESULTS* Echocardiogram 2D Echocardiogram has been performed.  Melanie Bradley 02/26/2019, 4:15 PM

## 2019-03-04 DIAGNOSIS — J449 Chronic obstructive pulmonary disease, unspecified: Secondary | ICD-10-CM | POA: Diagnosis not present

## 2019-03-04 DIAGNOSIS — I4892 Unspecified atrial flutter: Secondary | ICD-10-CM | POA: Diagnosis not present

## 2019-03-11 ENCOUNTER — Other Ambulatory Visit: Payer: Self-pay

## 2019-03-11 ENCOUNTER — Encounter: Payer: Self-pay | Admitting: Adult Health

## 2019-03-11 ENCOUNTER — Ambulatory Visit: Payer: Medicare HMO | Admitting: Adult Health

## 2019-03-11 VITALS — BP 142/96 | HR 60 | Temp 97.1°F | Ht 62.0 in | Wt 114.6 lb

## 2019-03-11 DIAGNOSIS — F0390 Unspecified dementia without behavioral disturbance: Secondary | ICD-10-CM

## 2019-03-11 DIAGNOSIS — R69 Illness, unspecified: Secondary | ICD-10-CM | POA: Diagnosis not present

## 2019-03-11 MED ORDER — MEMANTINE HCL 10 MG PO TABS: 10.0000 mg | ORAL_TABLET | Freq: Two times a day (BID) | ORAL | 3 refills | Status: DC

## 2019-03-11 MED ORDER — DONEPEZIL HCL 10 MG PO TABS: 10.0000 mg | ORAL_TABLET | Freq: Every day | ORAL | 3 refills | Status: DC

## 2019-03-11 NOTE — Progress Notes (Signed)
PATIENT: Melanie Bradley DOB: May 11, 1950  REASON FOR VISIT: follow up HISTORY FROM: patient  HISTORY OF PRESENT ILLNESS: Today 03/11/19:  Melanie Bradley is a 69 year old female with a history of memory disturbance.  She returns today for follow-up.  She feels that her memory has remained stable.  She reports that she is able to complete all ADLs independently.  Her husband is with her today.  He reports that he manages all the finances.  She is able to put the return addresses on their bills.  He also fills her pillbox each week.  She no longer does much cooking but does help him cook.  He reports that her driving is minimal.  She reports that she sleeps fairly well.  Denies any changes in her mood or behavior.  Denies hallucinations.  She continues on Namenda and Aricept.  She returns today for an evaluation.  HISTORY 09/02/18:  Melanie Bradley is a 69 year old female with a history of memory disturbance.  She returns today for follow-up.  She feels that she is doing well.  She continues on Aricept and Namenda.  She continues to live at home with her husband.  She is able to complete all ADLs independently.  She states that she only drives short distances.  Typically her husband drives.  She still does all the cooking.  Her husband manages the finances.  She denies any trouble sleeping.  She states that she has remained socially active.  She returns today for follow-up.  REVIEW OF SYSTEMS: Out of a complete 14 system review of symptoms, the patient complains only of the following symptoms, and all other reviewed systems are negative.  See HPI  ALLERGIES: Allergies  Allergen Reactions  . Penicillins Hives and Swelling    Has patient had a PCN reaction causing IMMEDIATE RASH, FACIAL/TONGUE/THROAT SWELLING, SOB, OR LIGHTHEADEDNESS WITH HYPOTENSION:  #  #  #  YES  #  #  #  Has patient had a PCN reaction causing severe rash involving mucus membranes or skin necrosis: No Has patient had a PCN  reaction that required hospitalization No Has patient had a PCN reaction occurring within the last 10 years: No If all of the above answers are "NO", then may proceed with Cephalosporin use.   . Benadryl [Diphenhydramine Hcl] Hives  . Msm [Methylsulfonylmethane] Hives  . Nubain [Nalbuphine Hcl] Hives  . Sulfa Antibiotics Hives  . Codeine     UNSPECIFIED REACTION [IF AT ALL] REFUSES TO TAKE    HOME MEDICATIONS: Outpatient Medications Prior to Visit  Medication Sig Dispense Refill  . alendronate (FOSAMAX) 70 MG tablet Take 70 mg by mouth once a week.     Marland Kitchen aspirin 81 MG tablet Take 81 mg by mouth daily.    . calcium carbonate (CALTRATE 600) 1500 (600 Ca) MG TABS tablet Take 600 mg of elemental calcium by mouth daily. Plus D3    . carvedilol (COREG) 6.25 MG tablet Take 1 tablet (6.25 mg total) by mouth 2 (two) times daily. 180 tablet 3  . Cyanocobalamin (B-12 PO) Take 6,000 mcg by mouth daily.    . diphenoxylate-atropine (LOMOTIL) 2.5-0.025 MG tablet Take 1 tablet by mouth every 4 (four) hours as needed. For diarrhea (Patient taking differently: Take 1 tablet by mouth every 4 (four) hours as needed. For diarrhea. "taking 3 a day"-Per husband) 60 tablet 3  . donepezil (ARICEPT) 10 MG tablet TAKE 1 TABLET BY MOUTH AT BEDTIME. 90 tablet 1  . escitalopram (LEXAPRO) 10  MG tablet Take 10 mg by mouth every evening.   0  . losartan (COZAAR) 25 MG tablet Take 1.5 tablets (37.5 mg total) by mouth daily. 135 tablet 3  . memantine (NAMENDA) 10 MG tablet Take 1 tablet (10 mg total) by mouth 2 (two) times daily. 180 tablet 3  . OVER THE COUNTER MEDICATION Take 1 tablet by mouth daily. cognisense otc supplement    . pyridostigmine (MESTINON) 60 MG tablet Take 30 mg by mouth daily. "taking half"-per husband    . SYNTHROID 88 MCG tablet Take 88 mcg by mouth daily. Except on friday     No facility-administered medications prior to visit.    PAST MEDICAL HISTORY: Past Medical History:  Diagnosis Date    . Atrial fibrillation ([place])   . Bloating   . Complication of anesthesia     " hard to wake up sometimes "  . Dementia ([place])   . Diverticula of colon   . Epigastric burning sensation   . GERD (gastroesophageal reflux disease)   . Headache disorder 06/03/2014  . Headache(784.0)   . Helicobacter pylori gastritis 2004  . Hemorrhoids 06/26/03   Dr. Laural Golden tcs  . Hiatal hernia 06/26/03   Dr. Laural Golden egd  . Hypertension   . Hypothyroidism   . 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 Memory difficulty 06/03/2014  . Shoulder fracture, left   . Spastic colon   . Wrist fracture     PAST SURGICAL HISTORY: Past Surgical History:  Procedure Laterality Date  . APPENDECTOMY    . CHOLECYSTECTOMY    . COLONOSCOPY  06/26/03   Dr. Kem Boroughs diverticula at the sigmoid colon with one of the transverse colon, normal terminal ileoscopy, small external hemorrhoids the  . COLONOSCOPY N/A 01/20/2016   sigmoid diverticulosis, otherwise normal.   . ESOPHAGOGASTRODUODENOSCOPY  03/15/2011   Dr. Trevor Iha hiatal hernia, abnormal gastric mucosa of unclear significance. Gastric biopsies negative, no H. pylori.Procedure: ESOPHAGOGASTRODUODENOSCOPY (EGD);  Surgeon: Daneil Dolin, MD;  [facility];  Service: Endoscopy;  Laterality: N/A;  7:30  . ORIF HUMERUS FRACTURE Left 05/29/2016   Procedure: LEFT OPEN REDUCTION INTERNAL FIXATION (ORIF) PROXIMAL HUMERUS FRACTURE;  Surgeon: Meredith Pel, MD;  Location: [place];  Service: Orthopedics;  Laterality: Left;  . ORIF WRIST FRACTURE Left 05/29/2016   Procedure: OPEN REDUCTION INTERNAL FIXATION (ORIF) LEFT WRIST FRACTURE;  Surgeon: Meredith Pel, MD;  Location: [place];  Service: Orthopedics;  Laterality: Left;  . TUBAL LIGATION    . UPPER GASTROINTESTINAL ENDOSCOPY  06/26/03   Dr. Wynelle Bourgeois sliding hiatal hernia with 8 mm tongue of gastric type mucosa at distal esophagus bile in the stomach.    FAMILY HISTORY: Family  History  Problem Relation Age of Onset  . Hypertension Mother   . Angina Mother   . COPD Father   . Heart failure Father   . Cancer Sister   . Dementia Sister   . Cardiomyopathy Brother   . Pneumonia Other   . Migraines Sister   . Colon cancer Neg Hx     SOCIAL HISTORY: Social History   Socioeconomic History  . Marital status: Married    Spouse name: Not on file  . Number of children: Not on file  . Years of education: 44  . Highest education level: Not on file  Occupational History  . Not on file  Tobacco Use  . Smoking status: Current Every Day Smoker    Packs/day: 1.00  Years: 45.00    Pack years: 45.00    Types: Cigarettes    Start date: 09/10/1966  . Smokeless tobacco: Never Used  . Tobacco comment: 12/21/16 1 PPD  Substance and Sexual Activity  . Alcohol use: No    Alcohol/week: 0.0 standard drinks  . Drug use: No  . Sexual activity: Yes  Other Topics Concern  . Not on file  Social History Narrative   Patient is right handed.   Patient drinks 2 cups of caffeine daily.   Education [employer]   Social Determinants of Health   Financial Resource Strain:   . Difficulty of Paying Living Expenses: Not on file  Food Insecurity:   . Worried About Charity fundraiser in the Last Year: Not on file  . Ran Out of Food in the Last Year: Not on file  Transportation Needs:   . Lack of Transportation (Medical): Not on file  . Lack of Transportation (Non-Medical): Not on file  Physical Activity:   . Days of Exercise per Week: Not on file  . Minutes of Exercise per Session: Not on file  Stress:   . Feeling of Stress : Not on file  Social Connections:   . Frequency of Communication with Friends and Family: Not on file  . Frequency of Social Gatherings with Friends and Family: Not on file  . Attends Religious Services: Not on file  . Active Member of Clubs or Organizations: Not on file  . Attends Archivist Meetings: Not on file  . Marital Status: Not  on file  Intimate Partner Violence:   . Fear of Current or Ex-Partner: Not on file  . Emotionally Abused: Not on file  . Physically Abused: Not on file  . Sexually Abused: Not on file      PHYSICAL EXAM  Vitals:   03/11/19 0919  BP: (!) 142/96  Pulse: 60  Temp: (!) 97.1 F (36.2 C)  Weight: 114 lb 9.6 oz (52 kg)  Height: 5\' 2"  (1.575 m)   Body mass index is 20.96 kg/m.   MMSE - Mini Mental State Exam 03/11/2019 09/02/2018 12/26/2017  Orientation to time 0 1 2  Orientation to Place 3 4 4   Registration 3 3 3   Attention/ Calculation 0 4 0  Recall 0 1 0  Language- name 2 objects 2 1 1   Language- repeat 1 1 1   Language- follow 3 step command 2 2 3   Language- follow 3 step command-comments - - -  Language- read & follow direction 1 1 1   Write a sentence 1 1 1   Copy design 0 1 1  Copy design-comments named 4 animals named 4 animals -  Total score 13 20 17      Generalized: Well developed, in no acute distress   Neurological examination  Mentation: Alert. Follows all commands speech and language fluent Cranial nerve II-XII: Pupils were equal round reactive to light. Extraocular movements were full, visual field were full on confrontational test. . Head turning and shoulder shrug  were normal and symmetric. Motor: The motor testing reveals 5 over 5 strength of all 4 extremities. Good symmetric motor tone is noted throughout.  Sensory: Sensory testing is intact to soft touch on all 4 extremities. No evidence of extinction is noted.  Coordination: Cerebellar testing reveals good finger-nose-finger and heel-to-shin bilaterally.  Gait and station: Gait is normal.  Reflexes: Deep tendon reflexes are symmetric and normal bilaterally.   DIAGNOSTIC DATA (LABS, IMAGING, TESTING) - I reviewed patient  records, labs, notes, testing and imaging myself where available.  Lab Results  Component Value Date   WBC 9.4 10/28/2018   HGB 11.6 (L) 10/28/2018   HCT 36.9 10/28/2018   MCV 97.4  10/28/2018   PLT 359 10/28/2018      Component Value Date/Time   NA 137 10/29/2018 0600   NA 136 (A) 09/11/2006 0000   K 3.5 10/29/2018 0600   CL 112 (H) 10/29/2018 0600   CO2 18 (L) 10/29/2018 0600   GLUCOSE 115 (H) 10/29/2018 0600   BUN 5 (L) 10/29/2018 0600   CREATININE 1.20 (H) 10/29/2018 0600   CREATININE 0.88 05/26/2011 0755   CALCIUM 8.5 (L) 10/29/2018 0600   PROT 7.7 10/26/2018 0927   ALBUMIN 3.9 10/26/2018 0927   AST 21 10/26/2018 0927   ALT 21 10/26/2018 0927   ALKPHOS 93 10/26/2018 0927   BILITOT 0.3 10/26/2018 0927   GFRNONAA 47 (L) 10/29/2018 0600   GFRAA 55 (L) 10/29/2018 0600   Lab Results  Component Value Date   CHOL 173 05/09/2013   HDL 55 05/09/2013   LDLCALC 101 (H) 05/09/2013   TRIG 83 05/09/2013   CHOLHDL 3.1 05/09/2013   No results found for: HGBA1C Lab Results  Component Value Date   VITAMINB12 239 06/03/2014   Lab Results  Component Value Date   TSH 1.334 10/28/2018      ASSESSMENT AND PLAN 69 y.o. year old female  has a past medical history of Atrial fibrillation ([place]), Bloating, Complication of anesthesia, Dementia ([place]), Diverticula of colon, Epigastric burning sensation, GERD (gastroesophageal reflux disease), Headache disorder (06/03/2014), [id number]), Helicobacter pylori gastritis (2004), Hemorrhoids (06/26/03), Hiatal hernia (06/26/03), Hypertension, Hypothyroidism, Left bundle branch block, Memory difficulty (06/03/2014), Shoulder fracture, left, Spastic colon, and Wrist fracture. here with :  1.  Memory disturbance  -Memory score has declined.  MMSE today 13 out of 30 previously 20 out of 30 -Continue on Aricept and Namenda -Continue to monitor symptoms -Advised if symptoms worsen or she develops new symptoms she should let us know -Follow-up in 6 months or sooner if needed   I spent 15 minutes with the patient. 50% of this time was spent discussing plan of care   Ward Givens, MSN, NP-C 03/11/2019, 9:40 AM [facility] [address] [place], [place] [address] [phone]

## 2019-03-11 NOTE — Patient Instructions (Signed)
Your Plan:  Continue aricept and namenda Memory score has declined slightly  If your symptoms worsen or you develop new symptoms please let us know.    Thank you for coming to see [place] at [facility]. I hope we have been able to provide you high quality care today.  You may receive a patient satisfaction survey over the next few weeks. We would appreciate your feedback and comments so that we may continue to improve ourselves and the health of our patients.

## 2019-03-12 NOTE — Progress Notes (Signed)
I have read the note, and I agree with the clinical assessment and plan.  Cass Vandermeulen K Bani Gianfrancesco   

## 2019-04-07 ENCOUNTER — Telehealth: Payer: Self-pay | Admitting: Internal Medicine

## 2019-04-07 DIAGNOSIS — K529 Noninfective gastroenteritis and colitis, unspecified: Secondary | ICD-10-CM

## 2019-04-07 DIAGNOSIS — K580 Irritable bowel syndrome with diarrhea: Secondary | ICD-10-CM

## 2019-04-07 NOTE — Telephone Encounter (Signed)
Spoke with pt, her spouse isn't in and she isn't sure what her medication should've been increased to. Will call pts spouse back to discuss the RX needed.

## 2019-04-07 NOTE — Telephone Encounter (Signed)
Pt's husband called to say that the patient's Rx had been increased and the pharnacy needs a new prescription. He said it was the generic of Lomotil 2.5 mg and she uses [facility]. [phone]

## 2019-04-08 NOTE — Telephone Encounter (Signed)
Spoke with pts spouse. Pt needs a refill of Lomotil 2.5 mg sent to [facility].

## 2019-04-09 MED ORDER — DIPHENOXYLATE-ATROPINE 2.5-0.025 MG PO TABS: 1.0000 | ORAL_TABLET | Freq: Four times a day (QID) | ORAL | 3 refills | Status: DC | PRN

## 2019-04-09 NOTE — Telephone Encounter (Signed)
Left a detailed message for pts spouse. Medication was sent in to [facility].

## 2019-04-09 NOTE — Telephone Encounter (Signed)
Rx sent to pharmacy per request.  

## 2019-04-09 NOTE — Telephone Encounter (Signed)
RX printed out. I faxed RX to pts pharmacy.

## 2019-04-09 NOTE — Addendum Note (Signed)
Addended by: Gordy Levan, Stavroula Rohde A on: 04/09/2019 03:27 PM   Modules accepted: Orders

## 2019-04-10 ENCOUNTER — Other Ambulatory Visit: Payer: Self-pay

## 2019-04-10 ENCOUNTER — Ambulatory Visit: Payer: Medicare HMO | Admitting: Cardiology

## 2019-04-10 ENCOUNTER — Encounter: Payer: Self-pay | Admitting: Cardiology

## 2019-04-10 ENCOUNTER — Other Ambulatory Visit (HOSPITAL_COMMUNITY)
Admission: RE | Admit: 2019-04-10 | Discharge: 2019-04-10 | Disposition: A | Discharge status: Home / Self Care | Payer: Medicare HMO | Source: Ambulatory Visit | Attending: Cardiology | Admitting: Cardiology

## 2019-04-10 VITALS — BP 140/96 | HR 77 | Temp 98.0°F | Ht 62.0 in | Wt 115.0 lb

## 2019-04-10 DIAGNOSIS — I5022 Chronic systolic (congestive) heart failure: Secondary | ICD-10-CM | POA: Insufficient documentation

## 2019-04-10 LAB — BASIC METABOLIC PANEL
Anion gap: 7 (ref 5–15)
BUN: 14 mg/dL (ref 8–23)
CO2: 29 mmol/L (ref 22–32)
Calcium: 8.9 mg/dL (ref 8.9–10.3)
Chloride: 105 mmol/L (ref 98–111)
Creatinine, Ser: 1.33 mg/dL — ABNORMAL HIGH (ref 0.44–1.00)
GFR calc Af Amer: 48 mL/min — ABNORMAL LOW (ref >60)
GFR calc non Af Amer: 42 mL/min — ABNORMAL LOW (ref >60)
Glucose, Bld: 100 mg/dL — ABNORMAL HIGH (ref 70–99)
Potassium: 4.1 mmol/L (ref 3.5–5.1)
Sodium: 141 mmol/L (ref 135–145)

## 2019-04-10 LAB — CBC WITH DIFFERENTIAL/PLATELET
Abs Immature Granulocytes: 0.01 10*3/uL (ref 0.00–0.07)
Basophils Absolute: 0.1 10*3/uL (ref 0.0–0.1)
Basophils Relative: 1 %
Eosinophils Absolute: 0.2 10*3/uL (ref 0.0–0.5)
Eosinophils Relative: 4 %
HCT: 41.9 % (ref 36.0–46.0)
Hemoglobin: 13.1 g/dL (ref 12.0–15.0)
Immature Granulocytes: 0 %
Lymphocytes Relative: 26 %
Lymphs Abs: 1.6 10*3/uL (ref 0.7–4.0)
MCH: 31.6 pg (ref 26.0–34.0)
MCHC: 31.3 g/dL (ref 30.0–36.0)
MCV: 101 fL — ABNORMAL HIGH (ref 80.0–100.0)
Monocytes Absolute: 0.7 10*3/uL (ref 0.1–1.0)
Monocytes Relative: 12 %
Neutro Abs: 3.5 10*3/uL (ref 1.7–7.7)
Neutrophils Relative %: 57 %
Platelets: 327 10*3/uL (ref 150–400)
RBC: 4.15 MIL/uL (ref 3.87–5.11)
RDW: 14 % (ref 11.5–15.5)
WBC: 6.2 10*3/uL (ref 4.0–10.5)
nRBC: 0 % (ref 0.0–0.2)

## 2019-04-10 MED ORDER — CARVEDILOL 12.5 MG PO TABS: 12.5000 mg | ORAL_TABLET | Freq: Two times a day (BID) | ORAL | 3 refills | Status: DC

## 2019-04-10 NOTE — Patient Instructions (Signed)
Medication Instructions:  INCREASE COREG TO 12.5 MG - TWO TIMES DAILY   Labwork: BMET CBC  COVID TESTING   Testing/Procedures: Your physician has requested that you have a cardiac catheterization. Cardiac catheterization is used to diagnose and/or treat various heart conditions. Doctors may recommend this procedure for a number of different reasons. The most common reason is to evaluate chest pain. Chest pain can be a symptom of coronary artery disease (CAD), and cardiac catheterization can show whether plaque is narrowing or blocking your heart's arteries. This procedure is also used to evaluate the valves, as well as measure the blood flow and oxygen levels in different parts of your heart. For further information please visit [email or web address]. Please follow instruction sheet, as given.    Follow-Up: Your physician recommends that you schedule a follow-up appointment in: 3-4 WEEKS POST CATH    Any Other Special Instructions Will Be Listed Below (If Applicable).      If you need a refill on your cardiac medications before your next appointment, please call your pharmacy.

## 2019-04-10 NOTE — Progress Notes (Signed)
Clinical Summary Melanie Bradley is a 69 y.o.female  1. Chronic systolic HF - new diagnosis during 10/2018 admission with syncope. Echo showed LVEF 35-40% - no recent SOb/DOE. Compliant with meds - bp is 114/75, HR 64 today   Jan 2021 echo LVEF 35-40%, mild to mod MR. - no recnt symptoms. Compliant with meds    2. Chronic LBBB - noted on prior cardiac testing, tachycardia induced after Lexiscan inhection with noted LBBB. Imaging was negative for ischemia.     3. Memory deficit/Dementia - on aricept, followed by neuro -remains active, independent. Enjoys doing yard and housework.        Past Medical History:  Diagnosis Date  . Atrial fibrillation ([place])   . Bloating   . Complication of anesthesia     " hard to wake up sometimes "  . Dementia ([place])   . Diverticula of colon   . Epigastric burning sensation   . GERD (gastroesophageal reflux disease)   . Headache disorder 06/03/2014  . Headache(784.0)   . Helicobacter pylori gastritis 2004  . Hemorrhoids 06/26/03   Dr. Laural Golden tcs  . Hiatal hernia 06/26/03   Dr. Laural Golden egd  . Hypertension   . Hypothyroidism   . Left bundle Janeya Deyo block    rate dependent LBBB 04/2013  . Memory difficulty 06/03/2014  . Shoulder fracture, left   . Spastic colon   . Wrist fracture      Allergies  Allergen Reactions  . Penicillins Hives and Swelling    Has patient had a PCN reaction causing IMMEDIATE RASH, FACIAL/TONGUE/THROAT SWELLING, SOB, OR LIGHTHEADEDNESS WITH HYPOTENSION:  #  #  #  YES  #  #  #  Has patient had a PCN reaction causing severe rash involving mucus membranes or skin necrosis: No Has patient had a PCN reaction that required hospitalization No Has patient had a PCN reaction occurring within the last 10 years: No If all of the above answers are "NO", then may proceed with Cephalosporin use.   . Benadryl [Diphenhydramine Hcl] Hives  . Msm [Methylsulfonylmethane] Hives  . Nubain [Nalbuphine Hcl] Hives  .  Sulfa Antibiotics Hives  . Codeine     UNSPECIFIED REACTION [IF AT ALL] REFUSES TO TAKE     Current Outpatient Medications  Medication Sig Dispense Refill  . alendronate (FOSAMAX) 70 MG tablet Take 70 mg by mouth once a week.     Marland Kitchen aspirin 81 MG tablet Take 81 mg by mouth daily.    . calcium carbonate (CALTRATE 600) 1500 (600 Ca) MG TABS tablet Take 600 mg of elemental calcium by mouth daily. Plus D3    . carvedilol (COREG) 6.25 MG tablet Take 1 tablet (6.25 mg total) by mouth 2 (two) times daily. 180 tablet 3  . Cyanocobalamin (B-12 PO) Take 6,000 mcg by mouth daily.    . diphenoxylate-atropine (LOMOTIL) 2.5-0.025 MG tablet Take 1 tablet by mouth 4 (four) times daily as needed. For diarrhea 240 tablet 3  . donepezil (ARICEPT) 10 MG tablet Take 1 tablet (10 mg total) by mouth at bedtime. 90 tablet 3  . escitalopram (LEXAPRO) 10 MG tablet Take 10 mg by mouth every evening.   0  . losartan (COZAAR) 25 MG tablet Take 1.5 tablets (37.5 mg total) by mouth daily. 135 tablet 3  . memantine (NAMENDA) 10 MG tablet Take 1 tablet (10 mg total) by mouth 2 (two) times daily. 180 tablet 3  . OVER THE COUNTER MEDICATION Take 1 tablet by  mouth daily. cognisense otc supplement    . pyridostigmine (MESTINON) 60 MG tablet Take 30 mg by mouth daily. "taking half"-per husband    . SYNTHROID 88 MCG tablet Take 88 mcg by mouth daily. Except on friday     No current facility-administered medications for this visit.     Past Surgical History:  Procedure Laterality Date  . APPENDECTOMY    . CHOLECYSTECTOMY    . COLONOSCOPY  06/26/03   Dr. Kem Boroughs diverticula at the sigmoid colon with one of the transverse colon, normal terminal ileoscopy, small external hemorrhoids the  . COLONOSCOPY N/A 01/20/2016   sigmoid diverticulosis, otherwise normal.   . ESOPHAGOGASTRODUODENOSCOPY  03/15/2011   Dr. Trevor Iha hiatal hernia, abnormal gastric mucosa of unclear significance. Gastric biopsies negative, no H.  pylori.Procedure: ESOPHAGOGASTRODUODENOSCOPY (EGD);  Surgeon: Daneil Dolin, MD;  [facility];  Service: Endoscopy;  Laterality: N/A;  7:30  . ORIF HUMERUS FRACTURE Left 05/29/2016   Procedure: LEFT OPEN REDUCTION INTERNAL FIXATION (ORIF) PROXIMAL HUMERUS FRACTURE;  Surgeon: Meredith Pel, MD;  Location: [facility];  Service: Orthopedics;  Laterality: Left;  . ORIF WRIST FRACTURE Left 05/29/2016   Procedure: OPEN REDUCTION INTERNAL FIXATION (ORIF) LEFT WRIST FRACTURE;  Surgeon: Meredith Pel, MD;  Location: [place];  Service: Orthopedics;  Laterality: Left;  . TUBAL LIGATION    . UPPER GASTROINTESTINAL ENDOSCOPY  06/26/03   Dr. Wynelle Bourgeois sliding hiatal hernia with 8 mm tongue of gastric type mucosa at distal esophagus bile in the stomach.     Allergies  Allergen Reactions  . Penicillins Hives and Swelling    Has patient had a PCN reaction causing IMMEDIATE RASH, FACIAL/TONGUE/THROAT SWELLING, SOB, OR LIGHTHEADEDNESS WITH HYPOTENSION:  #  #  #  YES  #  #  #  Has patient had a PCN reaction causing severe rash involving mucus membranes or skin necrosis: No Has patient had a PCN reaction that required hospitalization No Has patient had a PCN reaction occurring within the last 10 years: No If all of the above answers are "NO", then may proceed with Cephalosporin use.   . Benadryl [Diphenhydramine Hcl] Hives  . Msm [Methylsulfonylmethane] Hives  . Nubain [Nalbuphine Hcl] Hives  . Sulfa Antibiotics Hives  . Codeine     UNSPECIFIED REACTION [IF AT ALL] REFUSES TO TAKE      Family History  Problem Relation Age of Onset  . Hypertension Mother   . Angina Mother   . COPD Father   . Heart failure Father   . Cancer Sister   . Dementia Sister   . Cardiomyopathy Brother   . Pneumonia Other   . Migraines Sister   . Colon cancer Neg Hx      Social History Ms. Sweeley reports that she has been smoking cigarettes. She started smoking about 52 years ago. She has a 45.00  pack-year smoking history. She has never used smokeless tobacco. Ms. Gaby reports no history of alcohol use.   Review of Systems CONSTITUTIONAL: No weight loss, fever, chills, weakness or fatigue.  HEENT: Eyes: No visual loss, blurred vision, double vision or yellow sclerae.No hearing loss, sneezing, congestion, runny nose or sore throat.  SKIN: No rash or itching.  CARDIOVASCULAR: per hpi RESPIRATORY: No shortness of breath, cough or sputum.  GASTROINTESTINAL: No anorexia, nausea, vomiting or diarrhea. No abdominal pain or blood.  GENITOURINARY: No burning on urination, no polyuria NEUROLOGICAL: No headache, dizziness, syncope, paralysis, ataxia, numbness or tingling in the extremities. No change in  bowel or bladder control.  MUSCULOSKELETAL: No muscle, back pain, joint pain or stiffness.  LYMPHATICS: No enlarged nodes. No history of splenectomy.  PSYCHIATRIC: No history of depression or anxiety.  ENDOCRINOLOGIC: No reports of sweating, cold or heat intolerance. No polyuria or polydipsia.  Marland Kitchen   Physical Examination Today's Vitals   04/10/19 0913  BP: (!) 140/96  Pulse: 77  Temp: 98 F (36.7 C)  TempSrc: Temporal  SpO2: 98%  Weight: 115 lb (52.2 kg)  Height: 5\' 2"  (1.575 m)   Body mass index is 21.03 kg/m.  Gen: resting comfortably, no acute distress HEENT: no scleral icterus, pupils equal round and reactive, no palptable cervical adenopathy,  CV: RRR, no m/r/g, no jvd Resp: Clear to auscultation bilaterally GI: abdomen is soft, non-tender, non-distended, normal bowel sounds, no hepatosplenomegaly MSK: extremities are warm, no edema.  Skin: warm, no rash Neuro:  no focal deficits Psych: appropriate affect   Diagnostic Studies   04/2013 Lexiscan MPI FINDINGS:  The patient's initial EKG revealed a narrow QRS. After she received  Lexiscan, the heart rate increased to 145 and the QRS widened to a  left bundle Darianny Momon block. The increased rate persisted for  several  minutes but then began to slow. When the heart rate was slower, P  waves could be seen. There were no flutter waves. The raw data  reveals no evidence of excess motion. Tomographic images with stress  reveal a small area of moderate decreased uptake in the septum. Rest  images reveal a moderate area of decreased uptake in the septum.  These findings are most consistent with left bundle Zyana Amaro block.  Wall motion analysis reveals excellent motion with an ejection  fraction of 70%.  IMPRESSION:  This is a low risk scan. There is no scar or ischemia. There is  decreased activity in the septum, consistent with left bundle Purvis Sidle  block. It is noted that the QRS was narrow at the start of the  study. However with Lexiscan, the patient developed tachycardia with  left bundle Adriena Manfre block. Assessment shows that this was most  probably sinus tachycardia which slowed over time.  10/2018 echo IMPRESSIONS   1. The left ventricle has moderately reduced systolic function, with an ejection fraction of 35-40%. The cavity size was normal. Severe focal basal septal hypertrophy.. Diastolic dysfunction, grade indeterminate. Left ventricular diffuse hypokinesis. 2. The right ventricle has normal systolic function. The cavity was normal. There is no increase in right ventricular wall thickness. 3. The aortic valve is tricuspid. Mild thickening of the aortic valve. Mild aortic annular calcification noted. 4. The mitral valve is grossly normal. Mild thickening of the mitral valve leaflet. Mitral valve regurgitation is mild to moderate by color flow Doppler. 5. The tricuspid valve is grossly normal. 6. The aorta is normal unless otherwise noted.  12/2018 30 day event monitor  30 day event monitor  Min HR 57, Max HR 130, Avg HR 79  No symptoms reported  Telemetry tracings show sinus rhythm with occasional PVCs.  No significant arrhythmias  Jan 2021 echo IMPRESSIONS     1. Left ventricular ejection fraction, by visual estimation, is 35 to  40%. The left ventricle has moderately decreased function. There is no  left ventricular hypertrophy.  2. Left ventricular diastolic parameters are indeterminate.  3. The left ventricle demonstrates global hypokinesis.  4. Global right ventricle has normal systolic function.The right  ventricular size is normal. No increase in right ventricular wall  thickness.  5. Left atrial  size was normal.  6. Right atrial size was normal.  7. The mitral valve is normal in structure. Mild to moderate mitral valve  regurgitation. No evidence of mitral stenosis.  8. The tricuspid valve is normal in structure.  9. The aortic valve has an indeterminant number of cusps. Aortic valve  regurgitation is not visualized. No evidence of aortic valve sclerosis or  stenosis.  10. The pulmonic valve was not well visualized. Pulmonic valve  regurgitation is not visualized.  11. The inferior vena cava is normal in size with greater than 50%  respiratory variability, suggesting right atrial pressure of 3 mmHg.      Assessment and Plan   1 Chronic systolic HF - new diagnosis during 10/2018 admission - has been on medical therapy, repeat echo shows LVEF remains 35-40% despite medical therapy - no recent symptoms  - due to persistent dysfunction we will plan for LHC/RHC - we will increase coreg to 12.5mg  bid. Likely transition to entresto next visit   2. Dementia - despite history patient remains active, indepednent. Family reports her symptoms are stable. I don't see that her dementia is at the level that would cause [place] to avoid invasive procedures, plan for cath   I have reviewed the risks, indications, and alternatives to cardiac catheterization, possible angioplasty, and stenting with the patient and her husband today. Risks include but are not limited to bleeding, infection, vascular injury, stroke, myocardial infection,  arrhythmia, kidney injury, radiation-related injury in the case of prolonged fluoroscopy use, emergency cardiac surgery, and death. The patient understands the risks of serious complication is 1-2 in 123XX123 with diagnostic cardiac cath and 1-2% or less with angioplasty/stenting.   Arnoldo Lenis, M.D.

## 2019-04-10 NOTE — H&P (View-Only) (Signed)
Clinical Summary [name] is a 69 y.o.female  1. Chronic systolic HF - new diagnosis during 10/2018 admission with syncope. Echo showed LVEF 35-40% - no recent SOb/DOE. Compliant with meds - bp is 114/75, HR 64 today   Jan 2021 echo LVEF 35-40%, mild to mod MR. - no recnt symptoms. Compliant with meds    2. Chronic LBBB - noted on prior cardiac testing, tachycardia induced after Lexiscan inhection with noted LBBB. Imaging was negative for ischemia.     3. Memory deficit/Dementia - on aricept, followed by neuro -remains active, independent. Enjoys doing yard and housework.        Past Medical History:  Diagnosis Date  . Atrial fibrillation ([place])   . Bloating   . Complication of anesthesia     " hard to wake up sometimes "  . Dementia ([place])   . Diverticula of colon   . Epigastric burning sensation   . GERD (gastroesophageal reflux disease)   . Headache disorder 06/03/2014  . Headache(784.0)   . Helicobacter pylori gastritis 2004  . Hemorrhoids 06/26/03   Dr. Laural Golden tcs  . Hiatal hernia 06/26/03   Dr. Laural Golden egd  . Hypertension   . Hypothyroidism   . Left bundle Melanie Bradley block    rate dependent LBBB 04/2013  . Memory difficulty 06/03/2014  . Shoulder fracture, left   . Spastic colon   . Wrist fracture      Allergies  Allergen Reactions  . Penicillins Hives and Swelling    Has patient had a PCN reaction causing IMMEDIATE RASH, FACIAL/TONGUE/THROAT SWELLING, SOB, OR LIGHTHEADEDNESS WITH HYPOTENSION:  #  #  #  YES  #  #  #  Has patient had a PCN reaction causing severe rash involving mucus membranes or skin necrosis: No Has patient had a PCN reaction that required hospitalization No Has patient had a PCN reaction occurring within the last 10 years: No If all of the above answers are "NO", then may proceed with Cephalosporin use.   . Benadryl [Diphenhydramine Hcl] Hives  . Msm [Methylsulfonylmethane] Hives  . Nubain [Nalbuphine Hcl] Hives  .  Sulfa Antibiotics Hives  . Codeine     UNSPECIFIED REACTION [IF AT ALL] REFUSES TO TAKE     Current Outpatient Medications  Medication Sig Dispense Refill  . alendronate (FOSAMAX) 70 MG tablet Take 70 mg by mouth once a week.     Marland Kitchen aspirin 81 MG tablet Take 81 mg by mouth daily.    . calcium carbonate (CALTRATE 600) 1500 (600 Ca) MG TABS tablet Take 600 mg of elemental calcium by mouth daily. Plus D3    . carvedilol (COREG) 6.25 MG tablet Take 1 tablet (6.25 mg total) by mouth 2 (two) times daily. 180 tablet 3  . Cyanocobalamin (B-12 PO) Take 6,000 mcg by mouth daily.    . diphenoxylate-atropine (LOMOTIL) 2.5-0.025 MG tablet Take 1 tablet by mouth 4 (four) times daily as needed. For diarrhea 240 tablet 3  . donepezil (ARICEPT) 10 MG tablet Take 1 tablet (10 mg total) by mouth at bedtime. 90 tablet 3  . escitalopram (LEXAPRO) 10 MG tablet Take 10 mg by mouth every evening.   0  . losartan (COZAAR) 25 MG tablet Take 1.5 tablets (37.5 mg total) by mouth daily. 135 tablet 3  . memantine (NAMENDA) 10 MG tablet Take 1 tablet (10 mg total) by mouth 2 (two) times daily. 180 tablet 3  . OVER THE COUNTER MEDICATION Take 1 tablet by  mouth daily. cognisense otc supplement    . pyridostigmine (MESTINON) 60 MG tablet Take 30 mg by mouth daily. "taking half"-per husband    . SYNTHROID 88 MCG tablet Take 88 mcg by mouth daily. Except on friday     No current facility-administered medications for this visit.     Past Surgical History:  Procedure Laterality Date  . APPENDECTOMY    . CHOLECYSTECTOMY    . COLONOSCOPY  06/26/03   Dr. Kem Boroughs diverticula at the sigmoid colon with one of the transverse colon, normal terminal ileoscopy, small external hemorrhoids the  . COLONOSCOPY N/A 01/20/2016   sigmoid diverticulosis, otherwise normal.   . ESOPHAGOGASTRODUODENOSCOPY  03/15/2011   Dr. Trevor Iha hiatal hernia, abnormal gastric mucosa of unclear significance. Gastric biopsies negative, no H.  pylori.Procedure: ESOPHAGOGASTRODUODENOSCOPY (EGD);  Surgeon: Daneil Dolin, MD;  [facility];  Service: Endoscopy;  Laterality: N/A;  7:30  . ORIF HUMERUS FRACTURE Left 05/29/2016   Procedure: LEFT OPEN REDUCTION INTERNAL FIXATION (ORIF) PROXIMAL HUMERUS FRACTURE;  Surgeon: Meredith Pel, MD;  Location: [facility];  Service: Orthopedics;  Laterality: Left;  . ORIF WRIST FRACTURE Left 05/29/2016   Procedure: OPEN REDUCTION INTERNAL FIXATION (ORIF) LEFT WRIST FRACTURE;  Surgeon: Meredith Pel, MD;  Location: [facility];  Service: Orthopedics;  Laterality: Left;  . TUBAL LIGATION    . UPPER GASTROINTESTINAL ENDOSCOPY  06/26/03   Dr. Wynelle Bourgeois sliding hiatal hernia with 8 mm tongue of gastric type mucosa at distal esophagus bile in the stomach.     Allergies  Allergen Reactions  . Penicillins Hives and Swelling    Has patient had a PCN reaction causing IMMEDIATE RASH, FACIAL/TONGUE/THROAT SWELLING, SOB, OR LIGHTHEADEDNESS WITH HYPOTENSION:  #  #  #  YES  #  #  #  Has patient had a PCN reaction causing severe rash involving mucus membranes or skin necrosis: No Has patient had a PCN reaction that required hospitalization No Has patient had a PCN reaction occurring within the last 10 years: No If all of the above answers are "NO", then may proceed with Cephalosporin use.   . Benadryl [Diphenhydramine Hcl] Hives  . Msm [Methylsulfonylmethane] Hives  . Nubain [Nalbuphine Hcl] Hives  . Sulfa Antibiotics Hives  . Codeine     UNSPECIFIED REACTION [IF AT ALL] REFUSES TO TAKE      Family History  Problem Relation Age of Onset  . Hypertension Mother   . Angina Mother   . COPD Father   . Heart failure Father   . Cancer Sister   . Dementia Sister   . Cardiomyopathy Brother   . Pneumonia Other   . Migraines Sister   . Colon cancer Neg Hx      Social History [name] reports that she has been smoking cigarettes. She started smoking about 52 years ago. She has a 45.00  pack-year smoking history. She has never used smokeless tobacco. [name] reports no history of alcohol use.   Review of Systems CONSTITUTIONAL: No weight loss, fever, chills, weakness or fatigue.  HEENT: Eyes: No visual loss, blurred vision, double vision or yellow sclerae.No hearing loss, sneezing, congestion, runny nose or sore throat.  SKIN: No rash or itching.  CARDIOVASCULAR: per hpi RESPIRATORY: No shortness of breath, cough or sputum.  GASTROINTESTINAL: No anorexia, nausea, vomiting or diarrhea. No abdominal pain or blood.  GENITOURINARY: No burning on urination, no polyuria NEUROLOGICAL: No headache, dizziness, syncope, paralysis, ataxia, numbness or tingling in the extremities. No change in  bowel or bladder control.  MUSCULOSKELETAL: No muscle, back pain, joint pain or stiffness.  LYMPHATICS: No enlarged nodes. No history of splenectomy.  PSYCHIATRIC: No history of depression or anxiety.  ENDOCRINOLOGIC: No reports of sweating, cold or heat intolerance. No polyuria or polydipsia.  Marland Kitchen   Physical Examination Today's Vitals   04/10/19 0913  BP: (!) 140/96  Pulse: 77  Temp: 98 F (36.7 C)  TempSrc: Temporal  SpO2: 98%  Weight: 115 lb (52.2 kg)  Height: 5\' 2"  (1.575 m)   Body mass index is 21.03 kg/m.  Gen: resting comfortably, no acute distress HEENT: no scleral icterus, pupils equal round and reactive, no palptable cervical adenopathy,  CV: RRR, no m/r/g, no jvd Resp: Clear to auscultation bilaterally GI: abdomen is soft, non-tender, non-distended, normal bowel sounds, no hepatosplenomegaly MSK: extremities are warm, no edema.  Skin: warm, no rash Neuro:  no focal deficits Psych: appropriate affect   Diagnostic Studies   04/2013 Lexiscan MPI FINDINGS:  The patient's initial EKG revealed a narrow QRS. After she received  Lexiscan, the heart rate increased to 145 and the QRS widened to a  left bundle Lysha Schrade block. The increased rate persisted for  several  minutes but then began to slow. When the heart rate was slower, P  waves could be seen. There were no flutter waves. The raw data  reveals no evidence of excess motion. Tomographic images with stress  reveal a small area of moderate decreased uptake in the septum. Rest  images reveal a moderate area of decreased uptake in the septum.  These findings are most consistent with left bundle Tynika Luddy block.  Wall motion analysis reveals excellent motion with an ejection  fraction of 70%.  IMPRESSION:  This is a low risk scan. There is no scar or ischemia. There is  decreased activity in the septum, consistent with left bundle Sybrina Laning  block. It is noted that the QRS was narrow at the start of the  study. However with Lexiscan, the patient developed tachycardia with  left bundle Rickesha Veracruz block. Assessment shows that this was most  probably sinus tachycardia which slowed over time.  10/2018 echo IMPRESSIONS   1. The left ventricle has moderately reduced systolic function, with an ejection fraction of 35-40%. The cavity size was normal. Severe focal basal septal hypertrophy.. Diastolic dysfunction, grade indeterminate. Left ventricular diffuse hypokinesis. 2. The right ventricle has normal systolic function. The cavity was normal. There is no increase in right ventricular wall thickness. 3. The aortic valve is tricuspid. Mild thickening of the aortic valve. Mild aortic annular calcification noted. 4. The mitral valve is grossly normal. Mild thickening of the mitral valve leaflet. Mitral valve regurgitation is mild to moderate by color flow Doppler. 5. The tricuspid valve is grossly normal. 6. The aorta is normal unless otherwise noted.  12/2018 30 day event monitor  30 day event monitor  Min HR 57, Max HR 130, Avg HR 79  No symptoms reported  Telemetry tracings show sinus rhythm with occasional PVCs.  No significant arrhythmias  Jan 2021 echo IMPRESSIONS     1. Left ventricular ejection fraction, by visual estimation, is 35 to  40%. The left ventricle has moderately decreased function. There is no  left ventricular hypertrophy.  2. Left ventricular diastolic parameters are indeterminate.  3. The left ventricle demonstrates global hypokinesis.  4. Global right ventricle has normal systolic function.The right  ventricular size is normal. No increase in right ventricular wall  thickness.  5. Left atrial  size was normal.  6. Right atrial size was normal.  7. The mitral valve is normal in structure. Mild to moderate mitral valve  regurgitation. No evidence of mitral stenosis.  8. The tricuspid valve is normal in structure.  9. The aortic valve has an indeterminant number of cusps. Aortic valve  regurgitation is not visualized. No evidence of aortic valve sclerosis or  stenosis.  10. The pulmonic valve was not well visualized. Pulmonic valve  regurgitation is not visualized.  11. The inferior vena cava is normal in size with greater than 50%  respiratory variability, suggesting right atrial pressure of 3 mmHg.      Assessment and Plan   1 Chronic systolic HF - new diagnosis during 10/2018 admission - has been on medical therapy, repeat echo shows LVEF remains 35-40% despite medical therapy - no recent symptoms  - due to persistent dysfunction we will plan for LHC/RHC - we will increase coreg to 12.5mg  bid. Likely transition to entresto next visit   2. Dementia - despite history patient remains active, indepednent. Family reports her symptoms are stable. I don't see that her dementia is at the level that would cause [place] to avoid invasive procedures, plan for cath   I have reviewed the risks, indications, and alternatives to cardiac catheterization, possible angioplasty, and stenting with the patient and her husband today. Risks include but are not limited to bleeding, infection, vascular injury, stroke, myocardial infection,  arrhythmia, kidney injury, radiation-related injury in the case of prolonged fluoroscopy use, emergency cardiac surgery, and death. The patient understands the risks of serious complication is 1-2 in 123XX123 with diagnostic cardiac cath and 1-2% or less with angioplasty/stenting.   Arnoldo Lenis, M.D.

## 2019-04-11 ENCOUNTER — Other Ambulatory Visit: Payer: Self-pay | Admitting: Gastroenterology

## 2019-04-11 DIAGNOSIS — K580 Irritable bowel syndrome with diarrhea: Secondary | ICD-10-CM

## 2019-04-11 DIAGNOSIS — K529 Noninfective gastroenteritis and colitis, unspecified: Secondary | ICD-10-CM

## 2019-04-14 ENCOUNTER — Other Ambulatory Visit (HOSPITAL_COMMUNITY)
Admission: RE | Admit: 2019-04-14 | Discharge: 2019-04-14 | Disposition: A | Discharge status: Home / Self Care | Payer: Medicare HMO | Source: Ambulatory Visit | Attending: Interventional Cardiology | Admitting: Interventional Cardiology

## 2019-04-14 ENCOUNTER — Telehealth: Payer: Self-pay | Admitting: Interventional Cardiology

## 2019-04-14 ENCOUNTER — Other Ambulatory Visit: Payer: Self-pay

## 2019-04-14 DIAGNOSIS — Z20822 Contact with and (suspected) exposure to covid-19: Secondary | ICD-10-CM | POA: Diagnosis not present

## 2019-04-14 DIAGNOSIS — Z01812 Encounter for preprocedural laboratory examination: Secondary | ICD-10-CM | POA: Diagnosis present

## 2019-04-14 LAB — SARS CORONAVIRUS 2 (TAT 6-24 HRS): SARS Coronavirus 2: NEGATIVE

## 2019-04-14 NOTE — Telephone Encounter (Signed)
New Message  [facility] [facility] office is calling in to have Dr. Tamala Julian put in a PAT (covid test) order in for patient. Patient has been scheduled, but order is needed.

## 2019-04-14 NOTE — Telephone Encounter (Signed)
Cath ordered by [facility] office.  Will route to them to order.

## 2019-04-14 NOTE — Telephone Encounter (Signed)
Will forward to Dr. Harl Bowie to place order for covid test. Appt has been scheduled.

## 2019-04-15 ENCOUNTER — Ambulatory Visit: Payer: Medicare HMO | Admitting: Gastroenterology

## 2019-04-15 ENCOUNTER — Other Ambulatory Visit: Payer: Self-pay | Admitting: Cardiology

## 2019-04-15 DIAGNOSIS — I5022 Chronic systolic (congestive) heart failure: Secondary | ICD-10-CM

## 2019-04-15 MED ORDER — SODIUM CHLORIDE 0.9% FLUSH: 3.0000 mL | Freq: Two times a day (BID) | INTRAVENOUS | Status: DC

## 2019-04-15 NOTE — Telephone Encounter (Signed)
Which order is it that you need. I thought that part of the precath process from nursing was to place the covid order   Zandra Abts MD

## 2019-04-15 NOTE — Telephone Encounter (Signed)
We do not place the order for Covid tests, we only schedule the tests.

## 2019-04-16 ENCOUNTER — Other Ambulatory Visit: Payer: Self-pay | Admitting: Cardiology

## 2019-04-16 ENCOUNTER — Telehealth: Payer: Self-pay | Admitting: *Deleted

## 2019-04-16 NOTE — Telephone Encounter (Signed)
Pt contacted pre-catheterization scheduled at [facility] for: Thursday April 17, 2019 12 Noon  Verified arrival time and place: [facility]) at: 7 AM pre procedure hydration   No solid food after midnight prior to cath, clear liquids until 5 AM day of procedure. Contrast allergy: no  Hold: Losartan-day before and day of procedure-GFR 42-already taken today  Except hold medications AM meds can be  taken pre-cath with sip of water including: ASA 81 mg   Confirmed patient has responsible adult to drive home post procedure and observe 24 hours after arriving home: yes  Currently, due to Covid-19 pandemic, only one person will be allowed with patient. Must be the same person for patient's entire stay and will be required to wear a mask. They will be asked to wait in the waiting room for the duration of the patient's stay.  Patients are required to wear a mask when they enter the hospital.      COVID-19 Pre-Screening Questions:  . In the past 7 to 10 days have you had a cough,  shortness of breath, headache, congestion, fever (100 or greater) body aches, chills, sore throat, or sudden loss of taste or sense of smell? Headache, not new . Have you been around anyone with known Covid 19 in the past 7-10 days? no . Have you been around anyone who is awaiting Covid 19 test results in the past 7 to 10 days? no . Have you been around anyone who has been exposed to Covid 19, or has mentioned symptoms of Covid 19 within the past 7 to 10 days? No   I reviewed procedure/mask/visitor instructions, COVID-19 screening questions with patient and husband, they verbalized understanding, thanked me for call.  Per Dr Jeremy Johann based protocol for IV flow rate pre procedure hydration.

## 2019-04-17 ENCOUNTER — Ambulatory Visit (HOSPITAL_COMMUNITY)
Admission: RE | Disposition: A | Discharge status: Home / Self Care | Payer: Self-pay | Source: Home / Self Care | Attending: Interventional Cardiology

## 2019-04-17 ENCOUNTER — Ambulatory Visit (HOSPITAL_COMMUNITY)
Admission: RE | Admit: 2019-04-17 | Discharge: 2019-04-17 | Disposition: A | Discharge status: Home / Self Care | Payer: Medicare HMO | Attending: Interventional Cardiology | Admitting: Interventional Cardiology

## 2019-04-17 ENCOUNTER — Other Ambulatory Visit: Payer: Self-pay

## 2019-04-17 DIAGNOSIS — Z880 Allergy status to penicillin: Secondary | ICD-10-CM | POA: Diagnosis not present

## 2019-04-17 DIAGNOSIS — Z888 Allergy status to other drugs, medicaments and biological substances status: Secondary | ICD-10-CM | POA: Insufficient documentation

## 2019-04-17 DIAGNOSIS — Z882 Allergy status to sulfonamides status: Secondary | ICD-10-CM | POA: Diagnosis not present

## 2019-04-17 DIAGNOSIS — Z79890 Hormone replacement therapy (postmenopausal): Secondary | ICD-10-CM | POA: Insufficient documentation

## 2019-04-17 DIAGNOSIS — Z885 Allergy status to narcotic agent status: Secondary | ICD-10-CM | POA: Diagnosis not present

## 2019-04-17 DIAGNOSIS — R55 Syncope and collapse: Secondary | ICD-10-CM | POA: Diagnosis present

## 2019-04-17 DIAGNOSIS — Z79899 Other long term (current) drug therapy: Secondary | ICD-10-CM | POA: Insufficient documentation

## 2019-04-17 DIAGNOSIS — F17200 Nicotine dependence, unspecified, uncomplicated: Secondary | ICD-10-CM | POA: Insufficient documentation

## 2019-04-17 DIAGNOSIS — Z7983 Long term (current) use of bisphosphonates: Secondary | ICD-10-CM | POA: Insufficient documentation

## 2019-04-17 DIAGNOSIS — I2510 Atherosclerotic heart disease of native coronary artery without angina pectoris: Secondary | ICD-10-CM | POA: Diagnosis not present

## 2019-04-17 DIAGNOSIS — K589 Irritable bowel syndrome without diarrhea: Secondary | ICD-10-CM | POA: Diagnosis not present

## 2019-04-17 DIAGNOSIS — I5022 Chronic systolic (congestive) heart failure: Secondary | ICD-10-CM | POA: Insufficient documentation

## 2019-04-17 DIAGNOSIS — I110 Hypertensive heart disease with heart failure: Secondary | ICD-10-CM | POA: Insufficient documentation

## 2019-04-17 DIAGNOSIS — I4891 Unspecified atrial fibrillation: Secondary | ICD-10-CM | POA: Diagnosis not present

## 2019-04-17 DIAGNOSIS — Z7982 Long term (current) use of aspirin: Secondary | ICD-10-CM | POA: Diagnosis not present

## 2019-04-17 DIAGNOSIS — R079 Chest pain, unspecified: Secondary | ICD-10-CM | POA: Diagnosis present

## 2019-04-17 DIAGNOSIS — I4892 Unspecified atrial flutter: Secondary | ICD-10-CM | POA: Diagnosis present

## 2019-04-17 DIAGNOSIS — I447 Left bundle-branch block, unspecified: Secondary | ICD-10-CM | POA: Insufficient documentation

## 2019-04-17 DIAGNOSIS — E039 Hypothyroidism, unspecified: Secondary | ICD-10-CM | POA: Diagnosis not present

## 2019-04-17 DIAGNOSIS — Z720 Tobacco use: Secondary | ICD-10-CM | POA: Diagnosis present

## 2019-04-17 DIAGNOSIS — K219 Gastro-esophageal reflux disease without esophagitis: Secondary | ICD-10-CM | POA: Insufficient documentation

## 2019-04-17 DIAGNOSIS — F0390 Unspecified dementia without behavioral disturbance: Secondary | ICD-10-CM | POA: Insufficient documentation

## 2019-04-17 DIAGNOSIS — Z8249 Family history of ischemic heart disease and other diseases of the circulatory system: Secondary | ICD-10-CM | POA: Insufficient documentation

## 2019-04-17 HISTORY — PX: RIGHT/LEFT HEART CATH AND CORONARY ANGIOGRAPHY: CATH118266

## 2019-04-17 LAB — POCT I-STAT 7, (LYTES, BLD GAS, ICA,H+H)
Acid-Base Excess: 1 mmol/L (ref 0.0–2.0)
Bicarbonate: 27.9 mmol/L (ref 20.0–28.0)
Calcium, Ion: 1.19 mmol/L (ref 1.15–1.40)
HCT: 36 % (ref 36.0–46.0)
Hemoglobin: 12.2 g/dL (ref 12.0–15.0)
O2 Saturation: 98 %
Potassium: 3.7 mmol/L (ref 3.5–5.1)
Sodium: 141 mmol/L (ref 135–145)
TCO2: 29 mmol/L (ref 22–32)
pCO2 arterial: 51.3 mmHg — ABNORMAL HIGH (ref 32.0–48.0)
pH, Arterial: 7.343 — ABNORMAL LOW (ref 7.350–7.450)
pO2, Arterial: 123 mmHg — ABNORMAL HIGH (ref 83.0–108.0)

## 2019-04-17 LAB — POCT I-STAT EG7
Acid-Base Excess: 2 mmol/L (ref 0.0–2.0)
Bicarbonate: 28.8 mmol/L — ABNORMAL HIGH (ref 20.0–28.0)
Calcium, Ion: 1.22 mmol/L (ref 1.15–1.40)
HCT: 37 % (ref 36.0–46.0)
Hemoglobin: 12.6 g/dL (ref 12.0–15.0)
O2 Saturation: 68 %
Potassium: 3.7 mmol/L (ref 3.5–5.1)
Sodium: 141 mmol/L (ref 135–145)
TCO2: 30 mmol/L (ref 22–32)
pCO2, Ven: 55 mmHg (ref 44.0–60.0)
pH, Ven: 7.326 (ref 7.250–7.430)
pO2, Ven: 39 mmHg (ref 32.0–45.0)

## 2019-04-17 SURGERY — RIGHT/LEFT HEART CATH AND CORONARY ANGIOGRAPHY
Anesthesia: LOCAL

## 2019-04-17 MED ORDER — VERAPAMIL HCL 2.5 MG/ML IV SOLN
INTRAVENOUS | Status: AC
  Filled 2019-04-17: qty 2

## 2019-04-17 MED ORDER — HEPARIN (PORCINE) IN NACL 1000-0.9 UT/500ML-% IV SOLN
INTRAVENOUS | Status: DC | PRN
  Administered 2019-04-17 (×2): 500 mL

## 2019-04-17 MED ORDER — VERAPAMIL HCL 2.5 MG/ML IV SOLN
INTRAVENOUS | Status: DC | PRN
  Administered 2019-04-17: 10 mL via INTRA_ARTERIAL

## 2019-04-17 MED ORDER — IOHEXOL 350 MG/ML SOLN
INTRAVENOUS | Status: DC | PRN
  Administered 2019-04-17: 80 mL

## 2019-04-17 MED ORDER — SODIUM CHLORIDE 0.9 % IV SOLN: INTRAVENOUS | Status: DC

## 2019-04-17 MED ORDER — ONDANSETRON HCL 4 MG/2ML IJ SOLN: 4.0000 mg | Freq: Four times a day (QID) | INTRAMUSCULAR | Status: DC | PRN

## 2019-04-17 MED ORDER — HEPARIN SODIUM (PORCINE) 1000 UNIT/ML IJ SOLN
INTRAMUSCULAR | Status: AC
  Filled 2019-04-17: qty 1

## 2019-04-17 MED ORDER — LIDOCAINE HCL (PF) 1 % IJ SOLN
INTRAMUSCULAR | Status: DC | PRN
  Administered 2019-04-17: 2 mL
  Administered 2019-04-17: 3 mL

## 2019-04-17 MED ORDER — SODIUM CHLORIDE 0.9 % IV SOLN: 250.0000 mL | INTRAVENOUS | Status: DC | PRN

## 2019-04-17 MED ORDER — SODIUM CHLORIDE 0.9% FLUSH: 3.0000 mL | INTRAVENOUS | Status: DC | PRN

## 2019-04-17 MED ORDER — MIDAZOLAM HCL 2 MG/2ML IJ SOLN
INTRAMUSCULAR | Status: DC | PRN
  Administered 2019-04-17: 1 mg via INTRAVENOUS

## 2019-04-17 MED ORDER — HEPARIN (PORCINE) IN NACL 1000-0.9 UT/500ML-% IV SOLN
INTRAVENOUS | Status: AC
  Filled 2019-04-17: qty 1000

## 2019-04-17 MED ORDER — IOHEXOL 350 MG/ML SOLN
INTRAVENOUS | Status: AC
  Filled 2019-04-17: qty 1

## 2019-04-17 MED ORDER — SODIUM CHLORIDE 0.9 % WEIGHT BASED INFUSION: 1.0000 mL/kg/h | INTRAVENOUS | Status: DC

## 2019-04-17 MED ORDER — LABETALOL HCL 5 MG/ML IV SOLN: 10.0000 mg | INTRAVENOUS | Status: DC | PRN

## 2019-04-17 MED ORDER — FENTANYL CITRATE (PF) 100 MCG/2ML IJ SOLN
INTRAMUSCULAR | Status: DC | PRN
  Administered 2019-04-17: 25 ug via INTRAVENOUS

## 2019-04-17 MED ORDER — HYDRALAZINE HCL 20 MG/ML IJ SOLN: 10.0000 mg | INTRAMUSCULAR | Status: DC | PRN

## 2019-04-17 MED ORDER — ACETAMINOPHEN 325 MG PO TABS: 650.0000 mg | ORAL_TABLET | ORAL | Status: DC | PRN

## 2019-04-17 MED ORDER — OXYCODONE HCL 5 MG PO TABS: 5.0000 mg | ORAL_TABLET | ORAL | Status: DC | PRN

## 2019-04-17 MED ORDER — HEPARIN SODIUM (PORCINE) 1000 UNIT/ML IJ SOLN
INTRAMUSCULAR | Status: DC | PRN
  Administered 2019-04-17: 2500 [IU] via INTRAVENOUS

## 2019-04-17 MED ORDER — FENTANYL CITRATE (PF) 100 MCG/2ML IJ SOLN
INTRAMUSCULAR | Status: AC
  Filled 2019-04-17: qty 2

## 2019-04-17 MED ORDER — MIDAZOLAM HCL 2 MG/2ML IJ SOLN
INTRAMUSCULAR | Status: AC
  Filled 2019-04-17: qty 2

## 2019-04-17 MED ORDER — SODIUM CHLORIDE 0.9 % WEIGHT BASED INFUSION
3.0000 mL/kg/h | INTRAVENOUS | Status: DC
  Administered 2019-04-17: 3 mL/kg/h via INTRAVENOUS

## 2019-04-17 MED ORDER — HEPARIN (PORCINE) IN NACL 1000-0.9 UT/500ML-% IV SOLN
INTRAVENOUS | Status: AC
  Filled 2019-04-17: qty 500

## 2019-04-17 MED ORDER — SODIUM CHLORIDE 0.9% FLUSH: 3.0000 mL | Freq: Two times a day (BID) | INTRAVENOUS | Status: DC

## 2019-04-17 MED ORDER — ASPIRIN 81 MG PO CHEW: 81.0000 mg | CHEWABLE_TABLET | ORAL | Status: DC

## 2019-04-17 MED ORDER — LIDOCAINE HCL (PF) 1 % IJ SOLN
INTRAMUSCULAR | Status: AC
  Filled 2019-04-17: qty 30

## 2019-04-17 SURGICAL SUPPLY — 12 items
CATH 5FR JL3.5 JR4 ANG PIG MP (CATHETERS) ×1 IMPLANT
CATH BALLN WEDGE 5F 110CM (CATHETERS) ×1 IMPLANT
DEVICE RAD TR BAND REGULAR (VASCULAR PRODUCTS) ×1 IMPLANT
GLIDESHEATH SLEND A-KIT 6F 22G (SHEATH) ×1 IMPLANT
GUIDEWIRE INQWIRE 1.5J.035X260 (WIRE) IMPLANT
INQWIRE 1.5J .035X260CM (WIRE) ×2
KIT HEART LEFT (KITS) ×2 IMPLANT
PACK CARDIAC CATHETERIZATION (CUSTOM PROCEDURE TRAY) ×2 IMPLANT
SHEATH GLIDE SLENDER 4/5FR (SHEATH) ×1 IMPLANT
TRANSDUCER W/STOPCOCK (MISCELLANEOUS) ×2 IMPLANT
TUBING CIL FLEX 10 FLL-RA (TUBING) ×2 IMPLANT
WIRE HI TORQ VERSACORE-J 145CM (WIRE) ×1 IMPLANT

## 2019-04-17 NOTE — Discharge Instructions (Signed)
Radial Site Care  This sheet gives you information about how to care for yourself after your procedure. Your health care provider may also give you more specific instructions. If you have problems or questions, contact your health care provider. What can I expect after the procedure? After the procedure, it is common to have:  Bruising and tenderness at the catheter insertion area. Follow these instructions at home: Medicines  Take over-the-counter and prescription medicines only as told by your health care provider. Insertion site care  Follow instructions from your health care provider about how to take care of your insertion site. Make sure you: ? Wash your hands with soap and water before you change your bandage (dressing). If soap and water are not available, use hand sanitizer. ? Change your dressing as told by your health care provider. ? Leave stitches (sutures), skin glue, or adhesive strips in place. These skin closures may need to stay in place for 2 weeks or longer. If adhesive strip edges start to loosen and curl up, you may trim the loose edges. Do not remove adhesive strips completely unless your health care provider tells you to do that.  Check your insertion site every day for signs of infection. Check for: ? Redness, swelling, or pain. ? Fluid or blood. ? Pus or a bad smell. ? Warmth.  Do not take baths, swim, or use a hot tub until your health care provider approves.  You may shower 24-48 hours after the procedure, or as directed by your health care provider. ? Remove the dressing and gently wash the site with plain soap and water. ? Pat the area dry with a clean towel. ? Do not rub the site. That could cause bleeding.  Do not apply powder or lotion to the site. Activity   For 24 hours after the procedure, or as directed by your health care provider: ? Do not flex or bend the affected arm. ? Do not push or pull heavy objects with the affected arm. ? Do not  drive yourself home from the hospital or clinic. You may drive 24 hours after the procedure unless your health care provider tells you not to. ? Do not operate machinery or power tools.  Do not lift anything that is heavier than 10 lb (4.5 kg), or the limit that you are told, until your health care provider says that it is safe.  Ask your health care provider when it is okay to: ? Return to work or school. ? Resume usual physical activities or sports. ? Resume sexual activity. General instructions  If the catheter site starts to bleed, raise your arm and put firm pressure on the site. If the bleeding does not stop, get help right away. This is a medical emergency.  If you went home on the same day as your procedure, a responsible adult should be with you for the first 24 hours after you arrive home.  Keep all follow-up visits as told by your health care provider. This is important. Contact a health care provider if:  You have a fever.  You have redness, swelling, or yellow drainage around your insertion site. Get help right away if:  You have unusual pain at the radial site.  The catheter insertion area swells very fast.  The insertion area is bleeding, and the bleeding does not stop when you hold steady pressure on the area.  Your arm or hand becomes pale, cool, tingly, or numb. These symptoms may represent a serious problem   that is an emergency. Do not wait to see if the symptoms will go away. Get medical help right away. Call your local emergency services (911 in the [place] Do not drive yourself to the hospital. Summary  After the procedure, it is common to have bruising and tenderness at the site.  Follow instructions from your health care provider about how to take care of your radial site wound. Check the wound every day for signs of infection.  Do not lift anything that is heavier than 10 lb (4.5 kg), or the limit that you are told, until your health care provider says  that it is safe. This information is not intended to replace advice given to you by your health care provider. Make sure you discuss any questions you have with your health care provider. Document Revised: 03/07/2017 Document Reviewed: 03/07/2017 Elsevier Patient Education  2020 Elsevier Inc.  

## 2019-04-17 NOTE — Progress Notes (Addendum)
Discharge instructions reviewed with pt and her husband. Both voice understanding. Ambulated in hallway and to the bathroom to void tol well.

## 2019-04-17 NOTE — Interval H&P Note (Signed)
Cath Lab Visit (complete for each Cath Lab visit)  Clinical Evaluation Leading to the Procedure:   ACS: No.  Non-ACS:    Anginal Classification: CCS III  Anti-ischemic medical therapy: Minimal Therapy (1 class of medications)  Non-Invasive Test Results: No non-invasive testing performed  Prior CABG: No previous CABG      History and Physical Interval Note:  04/17/2019 11:27 AM  Melanie Bradley  has presented today for surgery, with the diagnosis of systolic heart failure.  The various methods of treatment have been discussed with the patient and family. After consideration of risks, benefits and other options for treatment, the patient has consented to  Procedure(s): RIGHT/LEFT HEART CATH AND CORONARY ANGIOGRAPHY (N/A) as a surgical intervention.  The patient's history has been reviewed, patient examined, no change in status, stable for surgery.  I have reviewed the patient's chart and labs.  Questions were answered to the patient's satisfaction.     Belva Crome III

## 2019-04-17 NOTE — CV Procedure (Signed)
   Left heart cath and right heart cath from right arm using real-time vascular ultrasound for arterial access.  Normal right heart pressures.  Widely patent coronary arteries.  EF 35%.  LVEDP normal.

## 2019-04-18 ENCOUNTER — Other Ambulatory Visit: Payer: Self-pay | Admitting: Adult Health

## 2019-04-18 MED FILL — Heparin Sod (Porcine)-NaCl IV Soln 1000 Unit/500ML-0.9%: INTRAVENOUS | Qty: 500 | Status: AC

## 2019-05-05 NOTE — Progress Notes (Signed)
Referring Provider: Sharilyn Sites, MD Primary Care Physician:  Sharilyn Sites, MD Primary GI: Dr. Gala Romney   Chief Complaint  Patient presents with  . Diarrhea    occ but better    HPI:   Melanie Bradley is a 69 y.o. female presenting today with a history of  acute onset of diarrhea shortly after eating at a local restaurant in Sept 2020, requiring hospitalization as she had failed outpatient supportive therapy. Cdiff negative, GI pathogen panel negative. Empirically treated with antibiotic therapy. TSH was normal. Last colonoscopy in 2017 with sigmoid diverticulosis, otherwise normal. When seen in Dec 2020, had persistent loose stool. Repeat Cdiff negative. 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 Lomotil provided to take as needed. Will need colonoscopy if no improvement with supportive measures. Here for follow-up today.  She is in wonderful spirits. Husband is with her. She continues with Lomotil three times per day before meals, but she notes diarrhea is resolved. Stool even trending somewhat harder. Will have bouts of diarrhea with urgency but only when eating out [employer]), ranch dressing, or greasy foods. She is able to pinpoint when foods will trigger symptoms. No abdominal pain or rectal bleeding. No dysphagia. No N/V. 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 She feels like she is in the best shape of her life.  She has gained weight.     Past Medical History:  Diagnosis Date  . Atrial fibrillation ([place])   . Bloating   . Complication of anesthesia     " hard to wake up sometimes "  . Dementia ([place])   . Diverticula of colon   . Epigastric burning sensation   . GERD (gastroesophageal reflux disease)   . Headache disorder 06/03/2014  . Headache(784.0)   . Helicobacter pylori gastritis 2004  . Hemorrhoids 06/26/03   Dr. Laural Golden tcs  . Hiatal hernia 06/26/03   Dr. Laural Golden egd  . Hypertension   . Hypothyroidism   . 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 Memory difficulty 06/03/2014  . Shoulder fracture, left   . Spastic colon   . Wrist fracture     Past Surgical History:  Procedure Laterality Date  . APPENDECTOMY    . CHOLECYSTECTOMY    . COLONOSCOPY  06/26/03   Dr. Kem Boroughs diverticula at the sigmoid colon with one of the transverse colon, normal terminal ileoscopy, small external hemorrhoids the  . COLONOSCOPY N/A 01/20/2016   sigmoid diverticulosis, otherwise normal.   . ESOPHAGOGASTRODUODENOSCOPY  03/15/2011   Dr. Trevor Iha hiatal hernia, abnormal gastric mucosa of unclear significance. Gastric biopsies negative, no H. pylori.Procedure: ESOPHAGOGASTRODUODENOSCOPY (EGD);  Surgeon: Daneil Dolin, MD;  [facility];  Service: Endoscopy;  Laterality: N/A;  7:30  . ORIF HUMERUS FRACTURE Left 05/29/2016   Procedure: LEFT OPEN REDUCTION INTERNAL FIXATION (ORIF) PROXIMAL HUMERUS FRACTURE;  Surgeon: Meredith Pel, MD;  Location: [place];  Service: Orthopedics;  Laterality: Left;  . ORIF WRIST FRACTURE Left 05/29/2016   Procedure: OPEN REDUCTION INTERNAL FIXATION (ORIF) LEFT WRIST FRACTURE;  Surgeon: Meredith Pel, MD;  Location: [place];  Service: Orthopedics;  Laterality: Left;  . RIGHT/LEFT HEART CATH AND CORONARY ANGIOGRAPHY N/A 04/17/2019   Procedure: RIGHT/LEFT HEART CATH AND CORONARY ANGIOGRAPHY;  Surgeon: Belva Crome, MD;  Location: [facility];  Service: Cardiovascular;  Laterality: N/A;  . TUBAL LIGATION    . UPPER GASTROINTESTINAL ENDOSCOPY  06/26/03  Dr. Wynelle Bourgeois sliding hiatal hernia with 8 mm tongue of gastric type mucosa at distal esophagus bile in the stomach.    Current Outpatient Medications  Medication Sig Dispense Refill  . acetaminophen (TYLENOL) 500 MG tablet Take 1,000 mg by mouth every 6 (six) hours as needed (headaches.).    Marland Kitchen alendronate (FOSAMAX) 70 MG tablet Take 70 mg by mouth every Tuesday.     Marland Kitchen aspirin EC 81 MG tablet Take 81  mg by mouth daily.    . Calcium Carb-Cholecalciferol (CALCIUM 600+D3 PO) Take 1 tablet by mouth daily with supper.    . carvedilol (COREG) 12.5 MG tablet Take 1 tablet (12.5 mg total) by mouth 2 (two) times daily. 180 tablet 3  . Cyanocobalamin (B-12 PO) Take 6,000 mcg by mouth daily.    . diphenoxylate-atropine (LOMOTIL) 2.5-0.025 MG tablet Take 1 tablet by mouth 4 (four) times daily as needed. For diarrhea (Patient taking differently: Take 1 tablet by mouth in the morning, at noon, and at bedtime. For diarrhea) 240 tablet 3  . donepezil (ARICEPT) 10 MG tablet Take 1 tablet (10 mg total) by mouth at bedtime. 90 tablet 3  . escitalopram (LEXAPRO) 10 MG tablet Take 10 mg by mouth daily with supper.   0  . losartan (COZAAR) 25 MG tablet Take 1.5 tablets (37.5 mg total) by mouth daily. 135 tablet 3  . memantine (NAMENDA) 10 MG tablet Take 1 tablet (10 mg total) by mouth 2 (two) times daily. 180 tablet 3  . pyridostigmine (MESTINON) 60 MG tablet Take 30 mg by mouth at bedtime.     Marland Kitchen Specialty Vitamins Products (BRAIN PO) Take 1 tablet by mouth daily. CogniSense Supplement    . SYNTHROID 88 MCG tablet Take 88 mcg by mouth See admin instructions. Take 1 tablet (88 mcg) by mouth on Sundays, Mondays, Tuesdays, Wednesdays,Thursdays, & Saturdays at bedtime, DOES NOT TAKE ANY Fridays.     Current Facility-Administered Medications  Medication Dose Route Frequency Provider Last Rate Last Admin  . sodium chloride flush (NS) 0.9 % injection 3 mL  3 mL Intravenous Q12H Arnoldo Lenis, MD        Allergies as of 05/06/2019 - Review Complete 05/06/2019  Allergen Reaction Noted  . Penicillins Hives and Swelling 07/28/2010  . Benadryl [diphenhydramine hcl] Hives 12/02/2010  . Msm [methylsulfonylmethane] Hives 06/02/2014  . Nubain [nalbuphine hcl] Hives 06/02/2014  . Sulfa antibiotics Hives 07/28/2010  . Codeine  07/28/2010    Family History  Problem Relation Age of Onset  . Hypertension Mother   .  Angina Mother   . COPD Father   . Heart failure Father   . Cancer Sister   . Dementia Sister   . Cardiomyopathy Brother   . Pneumonia Other   . Migraines Sister   . Colon cancer Neg Hx     Social History   Socioeconomic History  . Marital status: Married    Spouse name: Not on file  . Number of children: Not on file  . Years of education: 51  . Highest education level: Not on file  Occupational History  . Not on file  Tobacco Use  . Smoking status: Current Every Day Smoker    Packs/day: 1.00    Years: 45.00    Pack years: 45.00    Types: Cigarettes    Start date: 09/10/1966  . Smokeless tobacco: Never Used  . Tobacco comment: 12/21/16 1 PPD  Substance and Sexual Activity  . Alcohol use: No  Alcohol/week: 0.0 standard drinks  . Drug use: No  . Sexual activity: Yes  Other Topics Concern  . Not on file  Social History Narrative   Patient is right handed.   Patient drinks 2 cups of caffeine daily.   Education [employer]   Social Determinants of Health   Financial Resource Strain:   . Difficulty of Paying Living Expenses:   Food Insecurity:   . Worried About Charity fundraiser in the Last Year:   . Arboriculturist in the Last Year:   Transportation Bradley:   . Film/video editor (Medical):   Marland Kitchen Lack of Transportation (Non-Medical):   Physical Activity:   . Days of Exercise per Week:   . Minutes of Exercise per Session:   Stress:   . Feeling of Stress :   Social Connections:   . Frequency of Communication with Friends and Family:   . Frequency of Social Gatherings with Friends and Family:   . Attends Religious Services:   . Active Member of Clubs or Organizations:   . Attends Archivist Meetings:   Marland Kitchen Marital Status:     Review of Systems: Gen: Denies fever, chills, anorexia. Denies fatigue, weakness, weight loss.  CV: Denies chest pain, palpitations, syncope, peripheral edema, and claudication. Resp: Denies dyspnea at rest, cough, wheezing,  coughing up blood, and pleurisy. GI: see HPI Derm: Denies rash, itching, dry skin Psych: Denies depression, anxiety, memory loss, confusion. No homicidal or suicidal ideation.  Heme: Denies bruising, bleeding, and enlarged lymph nodes.  Physical Exam: BP (!) 135/95   Pulse 60   Temp (!) 96.6 F (35.9 C) (Temporal)   Ht 5\' 2"  (1.575 m)   Wt 115 lb (52.2 kg)   BMI 21.03 kg/m  General:   Alert and oriented. No distress noted. Pleasant and cooperative.  Head:  Normocephalic and atraumatic. Eyes:  Conjuctiva clear without scleral icterus. Mouth:  Mask in place Abdomen:  +BS, soft, non-tender and non-distended. No rebound or guarding. No HSM or masses noted. Msk:  Symmetrical without gross deformities. Normal posture. Extremities:  Without edema. Neurologic:  Alert and  oriented x4 Psych:  Alert and cooperative. Normal mood and affect.  ASSESSMENT: TAMARIN JENNETT is a 69 y.o. female presenting today with history of diarrhea requiring hospitalization in Sept 2020, with persisting diarrhea thereafter that was felt most likely consistent with post-infectious IBS. She returns in follow-up reporting feeling the best shape of her life, energetic, and only dealing with flares if eating certain foods as noted above. As she is still taking Lomotil scheduled, we will wean down off of this. Discussed with husband decreasing to BID for one week then daily for one week, so we can see if she has any breakthrough. Ultimately, the goal would be to only take prn and not daily.    PLAN:   Wean Lomotil as discussed  Continue to avoid dietary triggers  Call if recurrent diarrhea  Return in 6-8 months  Melanie Needs, PhD, [facility]

## 2019-05-06 ENCOUNTER — Ambulatory Visit: Payer: Medicare HMO | Admitting: Gastroenterology

## 2019-05-06 ENCOUNTER — Encounter: Payer: Self-pay | Admitting: Gastroenterology

## 2019-05-06 ENCOUNTER — Other Ambulatory Visit: Payer: Self-pay

## 2019-05-06 VITALS — BP 135/95 | HR 60 | Temp 96.6°F | Ht 62.0 in | Wt 115.0 lb

## 2019-05-06 DIAGNOSIS — K589 Irritable bowel syndrome without diarrhea: Secondary | ICD-10-CM

## 2019-05-06 NOTE — Patient Instructions (Signed)
Let's decrease Lomotil to only twice a day (starting Friday when you refill the pill pack). Do this for one week, then decrease to once per day if tolerated. Do this for one week, and then you can come completely off of it. If you have recurrent loose stools, please let me know! The goal is for you to only use this as needed going forward, but we need to see how you do with tapering down.  We will see you in 6-8 months!  Have a wonderful birthday upcoming, and a Happy Anniversary!  I enjoyed seeing you again today! As you know, I value our relationship and want to provide genuine, compassionate, and quality care. I welcome your feedback. If you receive a survey regarding your visit,  I greatly appreciate you taking time to fill this out. See you next time!  Annitta Needs, PhD, ANP-BC [facility] Gastroenter[facility]

## 2019-06-03 ENCOUNTER — Encounter: Payer: Self-pay | Admitting: Cardiology

## 2019-06-03 ENCOUNTER — Ambulatory Visit: Payer: Medicare HMO | Admitting: Cardiology

## 2019-06-03 VITALS — BP 110/84 | HR 66 | Temp 97.1°F | Ht 61.0 in | Wt 113.0 lb

## 2019-06-03 DIAGNOSIS — R079 Chest pain, unspecified: Secondary | ICD-10-CM | POA: Diagnosis not present

## 2019-06-03 DIAGNOSIS — I5022 Chronic systolic (congestive) heart failure: Secondary | ICD-10-CM | POA: Diagnosis not present

## 2019-06-03 MED ORDER — ENTRESTO 24-26 MG PO TABS: 1.0000 | ORAL_TABLET | Freq: Two times a day (BID) | ORAL | 6 refills | Status: DC

## 2019-06-03 NOTE — Patient Instructions (Signed)
Medication Instructions:  STOP LOSARTAN   START ENTRESTO 24/26 MF - TWO TIMES DAILY   Labwork: NONE  Testing/Procedures: Your physician has requested that you have a cardiac MRI. Cardiac MRI uses a computer to create images of your heart as its beating, producing both still and moving pictures of your heart and major blood vessels. For further information please visit [email or web address] Please follow the instruction sheet given to you today for more information.    Follow-Up: Your physician recommends that you schedule a follow-up appointment in: 6 weeks    Any Other Special Instructions Will Be Listed Below (If Applicable).     If you need a refill on your cardiac medications before your next appointment, please call your pharmacy.

## 2019-06-03 NOTE — Progress Notes (Signed)
Clinical Summary Ms. Schmieder is a 69 y.o.female seen today for follow up of the following medical problems.    1. Chronic systolic HF - new diagnosis during 10/2018 admission with syncope. Echo showed LVEF 35-40% - no recent SOb/DOE. Compliant with meds - bp is 114/75, HR 64 today   Jan 2021 echo LVEF 35-40%, mild to mod MR.  04/2019 no significant CAD, normal filling pressures. CI 3.7 Mean PA 15, PCWP 7 - no recent symptoms  2. Chronic LBBB - noted on prior cardiac testing, tachycardia induced after Lexiscan inhection with noted LBBB. Imaging was negative for ischemia.     3. Memory deficit/Dementia - on aricept, followed by neuro -remains active, independent. Enjoys doing yard and housework.   Has not had covid vaccine, reports pcp has concerns about allergy and monitoring data at this time.  Husband has had vaccine.  Past Medical History:  Diagnosis Date  . Atrial fibrillation ([place])   . Bloating   . Complication of anesthesia     " hard to wake up sometimes "  . Dementia ([place])   . Diverticula of colon   . Epigastric burning sensation   . GERD (gastroesophageal reflux disease)   . Headache disorder 06/03/2014  . Headache(784.0)   . Helicobacter pylori gastritis 2004  . Hemorrhoids 06/26/03   Dr. Laural Golden [facility]  . Hiatal hernia 06/26/03   Dr. Laural Golden eg[facility]  . Hypertension   . Hypothyroidism   . 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 Memory difficulty 06/03/2014  . Shoulder fracture, left   . Spastic colon   . Wrist fracture      Allergies  Allergen Reactions  . Penicillins Hives and Swelling    Has patient had a PCN reaction causing IMMEDIATE RASH, FACIAL/TONGUE/THROAT SWELLING, SOB, OR LIGHTHEADEDNESS WITH HYPOTENSION:  #  #  #  YES  #  #  #  Has patient had a PCN reaction causing severe rash involving mucus membranes or skin necrosis: No Has patient had a PCN reaction that required hospitalization No Has patient had a PCN  reaction occurring within the last 10 years: No If all of the above answers are "NO", then may proceed with Cephalosporin use.   . Benadryl [Diphenhydramine Hcl] Hives  . Msm [Methylsulfonylmethane] Hives  . Nubain [Nalbuphine Hcl] Hives  . Sulfa Antibiotics Hives  . Codeine     UNSPECIFIED REACTION [IF AT ALL] REFUSES TO TAKE     Current Outpatient Medications  Medication Sig Dispense Refill  . acetaminophen (TYLENOL) 500 MG tablet Take 1,000 mg by mouth every 6 (six) hours as needed (headaches.).    Marland Kitchen alendronate (FOSAMAX) 70 MG tablet Take 70 mg by mouth every Tuesday.     Marland Kitchen aspirin EC 81 MG tablet Take 81 mg by mouth daily.    . Calcium Carb-Cholecalciferol (CALCIUM 600+D3 PO) Take 1 tablet by mouth daily with supper.    . carvedilol (COREG) 12.5 MG tablet Take 1 tablet (12.5 mg total) by mouth 2 (two) times daily. 180 tablet 3  . Cyanocobalamin (B-12 PO) Take 6,000 mcg by mouth daily.    . diphenoxylate-atropine (LOMOTIL) 2.5-0.025 MG tablet Take 1 tablet by mouth 4 (four) times daily as needed. For diarrhea (Patient taking differently: Take 1 tablet by mouth in the morning, at noon, and at bedtime. For diarrhea) 240 tablet 3  . donepezil (ARICEPT) 10 MG tablet Take 1 tablet (10 mg total) by mouth at bedtime.  90 tablet 3  . escitalopram (LEXAPRO) 10 MG tablet Take 10 mg by mouth daily with supper.   0  . losartan (COZAAR) 25 MG tablet Take 1.5 tablets (37.5 mg total) by mouth daily. 135 tablet 3  . memantine (NAMENDA) 10 MG tablet Take 1 tablet (10 mg total) by mouth 2 (two) times daily. 180 tablet 3  . pyridostigmine (MESTINON) 60 MG tablet Take 30 mg by mouth at bedtime.     Marland Kitchen Specialty Vitamins Products (BRAIN PO) Take 1 tablet by mouth daily. CogniSense Supplement    . SYNTHROID 88 MCG tablet Take 88 mcg by mouth See admin instructions. Take 1 tablet (88 mcg) by mouth on Sundays, Mondays, Tuesdays, Wednesdays,Thursdays, & Saturdays at bedtime, DOES NOT TAKE ANY Fridays.      Current Facility-Administered Medications  Medication Dose Route Frequency Provider Last Rate Last Admin  . sodium chloride flush (NS) 0.9 % injection 3 mL  3 mL Intravenous Q12H [place] Lehnen, Alphonse Guild, MD         Past Surgical History:  Procedure Laterality Date  . APPENDECTOMY    . CHOLECYSTECTOMY    . COLONOSCOPY  06/26/03   Dr. Kem Boroughs diverticula at the sigmoid colon with one of the transverse colon, normal terminal ileoscopy, small external hemorrhoids the  . COLONOSCOPY N/A 01/20/2016   sigmoid diverticulosis, otherwise normal.   . ESOPHAGOGASTRODUODENOSCOPY  03/15/2011   Dr. Trevor Iha hiatal hernia, abnormal gastric mucosa of unclear significance. Gastric biopsies negative, no H. pylori.Procedure: ESOPHAGOGASTRODUODENOSCOPY (EGD);  Surgeon: Daneil Dolin, MD;  Location: [facility];  Service: Endoscopy;  Laterality: N/A;  7:30  . ORIF HUMERUS FRACTURE Left 05/29/2016   Procedure: LEFT OPEN REDUCTION INTERNAL FIXATION (ORIF) PROXIMAL HUMERUS FRACTURE;  Surgeon: Meredith Pel, MD;  Location: [facility];  Service: Orthopedics;  Laterality: Left;  . ORIF WRIST FRACTURE Left 05/29/2016   Procedure: OPEN REDUCTION INTERNAL FIXATION (ORIF) LEFT WRIST FRACTURE;  Surgeon: Meredith Pel, MD;  Location: [facility];  Service: Orthopedics;  Laterality: Left;  . RIGHT/LEFT HEART CATH AND CORONARY ANGIOGRAPHY N/A 04/17/2019   Procedure: RIGHT/LEFT HEART CATH AND CORONARY ANGIOGRAPHY;  Surgeon: Belva Crome, MD;  Location: [facility];  Service: Cardiovascular;  Laterality: N/A;  . TUBAL LIGATION    . UPPER GASTROINTESTINAL ENDOSCOPY  06/26/03   Dr. Wynelle Bourgeois sliding hiatal hernia with 8 mm tongue of gastric type mucosa at distal esophagus bile in the stomach.     Allergies  Allergen Reactions  . Penicillins Hives and Swelling    Has patient had a PCN reaction causing IMMEDIATE RASH, FACIAL/TONGUE/THROAT SWELLING, SOB, OR LIGHTHEADEDNESS WITH HYPOTENSION:  #  #  #  YES  #   #  #  Has patient had a PCN reaction causing severe rash involving mucus membranes or skin necrosis: No Has patient had a PCN reaction that required hospitalization No Has patient had a PCN reaction occurring within the last 10 years: No If all of the above answers are "NO", then may proceed with Cephalosporin use.   . Benadryl [Diphenhydramine Hcl] Hives  . Msm [Methylsulfonylmethane] Hives  . Nubain [Nalbuphine Hcl] Hives  . Sulfa Antibiotics Hives  . Codeine     UNSPECIFIED REACTION [IF AT ALL] REFUSES TO TAKE      Family History  Problem Relation Age of Onset  . Hypertension Mother   . Angina Mother   . COPD Father   . Heart failure Father   . Cancer Sister   . Dementia  Sister   . Cardiomyopathy Brother   . Pneumonia Other   . Migraines Sister   . Colon cancer Neg Hx      Social History Ms. Scurry reports that she has been smoking cigarettes. She started smoking about 52 years ago. She has a 45.00 pack-year smoking history. She has never used smokeless tobacco. Ms. Odaniel reports no history of alcohol use.   Review of Systems CONSTITUTIONAL: No weight loss, fever, chills, weakness or fatigue.  HEENT: Eyes: No visual loss, blurred vision, double vision or yellow sclerae.No hearing loss, sneezing, congestion, runny nose or sore throat.  SKIN: No rash or itching.  CARDIOVASCULAR: per hpi RESPIRATORY: No shortness of breath, cough or sputum.  GASTROINTESTINAL: No anorexia, nausea, vomiting or diarrhea. No abdominal pain or blood.  GENITOURINARY: No burning on urination, no polyuria NEUROLOGICAL: No headache, dizziness, syncope, paralysis, ataxia, numbness or tingling in the extremities. No change in bowel or bladder control.  MUSCULOSKELETAL: No muscle, back pain, joint pain or stiffness.  LYMPHATICS: No enlarged nodes. No history of splenectomy.  PSYCHIATRIC: No history of depression or anxiety.  ENDOCRINOLOGIC: No reports of sweating, cold or heat intolerance.  No polyuria or polydipsia.  Marland Kitchen   Physical Examination Today's Vitals   06/03/19 0913  BP: 110/84  Pulse: 66  Temp: (!) 97.1 F (36.2 C)  SpO2: 97%  Weight: 113 lb (51.3 kg)  Height: 5\' 1"  (1.549 m)   Body mass index is 21.35 kg/m.  Gen: resting comfortably, no acute distress HEENT: no scleral icterus, pupils equal round and reactive, no palptable cervical adenopathy,  CV: RRR, no m/r/g, no jvd Resp: Clear to auscultation bilaterally GI: abdomen is soft, non-tender, non-distended, normal bowel sounds, no hepatosplenomegaly MSK: extremities are warm, no edema.  Skin: warm, no rash Neuro:  no focal deficits Psych: appropriate affect   Diagnostic Studies 04/2013 Lexiscan MPI FINDINGS:  The patient's initial EKG revealed a narrow QRS. After she received  Lexiscan, the heart rate increased to 145 and the QRS widened to a  left bundle Cheyann Blecha block. The increased rate persisted for several  minutes but then began to slow. When the heart rate was slower, P  waves could be seen. There were no flutter waves. The raw data  reveals no evidence of excess motion. Tomographic images with stress  reveal a small area of moderate decreased uptake in the septum. Rest  images reveal a moderate area of decreased uptake in the septum.  These findings are most consistent with left bundle Garison Genova block.  Wall motion analysis reveals excellent motion with an ejection  fraction of 70%.  IMPRESSION:  This is a low risk scan. There is no scar or ischemia. There is  decreased activity in the septum, consistent with left bundle Zenola Dezarn  block. It is noted that the QRS was narrow at the start of the  study. However with Lexiscan, the patient developed tachycardia with  left bundle Therron Sells block. Assessment shows that this was most  probably sinus tachycardia which slowed over time.  10/2018 echo IMPRESSIONS   1. The left ventricle has moderately reduced systolic function, with  an ejection fraction of 35-40%. The cavity size was normal. Severe focal basal septal hypertrophy.. Diastolic dysfunction, grade indeterminate. Left ventricular diffuse hypokinesis. 2. The right ventricle has normal systolic function. The cavity was normal. There is no increase in right ventricular wall thickness. 3. The aortic valve is tricuspid. Mild thickening of the aortic valve. Mild aortic annular calcification noted. 4. The mitral  valve is grossly normal. Mild thickening of the mitral valve leaflet. Mitral valve regurgitation is mild to moderate by color flow Doppler. 5. The tricuspid valve is grossly normal. 6. The aorta is normal unless otherwise noted.  12/2018 30 day event monitor  30 day event monitor  Min HR 57, Max HR 130, Avg HR 79  No symptoms reported  Telemetry tracings show sinus rhythm with occasional PVCs.  No significant arrhythmias  Jan 2021 echo IMPRESSIONS    1. Left ventricular ejection fraction, by visual estimation, is 35 to  40%. The left ventricle has moderately decreased function. There is no  left ventricular hypertrophy.  2. Left ventricular diastolic parameters are indeterminate.  3. The left ventricle demonstrates global hypokinesis.  4. Global right ventricle has normal systolic function.The right  ventricular size is normal. No increase in right ventricular wall  thickness.  5. Left atrial size was normal.  6. Right atrial size was normal.  7. The mitral valve is normal in structure. Mild to moderate mitral valve  regurgitation. No evidence of mitral stenosis.  8. The tricuspid valve is normal in structure.  9. The aortic valve has an indeterminant number of cusps. Aortic valve  regurgitation is not visualized. No evidence of aortic valve sclerosis or  stenosis.  10. The pulmonic valve was not well visualized. Pulmonic valve  regurgitation is not visualized.  11. The inferior vena cava is normal in size with greater than  50%  respiratory variability, suggesting right atrial pressure of 3 mmHg.     04/2019 RHC/LHC  Normal right heart pressures.  Cardiac output 3.7 L/min.  Cardiac index 2.5 L/min/m  Normal left main  Transapical LAD.  First diagonal contains segmental proximal 40% narrowing.  Normal small ramus.  Circumflex is normal.  The right coronary demonstrates generalized minimal atherosclerosis.  No focal stenosis.  Anterior and apical mild to moderate hypokinesis.  Estimated ejection fraction 35 to 40%.  RECOMMENDATIONS:   Guideline directed therapy for left ventricular systolic dysfunction in the setting of left bundle Ariyanna Oien block.  If no improvement in LV function with therapy, consider resync.  Assessment and Plan   1Chronic systolic HF - new diagnosis during 10/2018 admission -has been on medical therapy, repeat echo shows LVEF remains 35-40% despite medical therapy - cath without significant disease.   - will change losartan to entresto 24/26mg  bid - obtain cardiac MRI in setting of systolic dysfunction    F/u 6weeks    Arnoldo Lenis, M.D.

## 2019-06-16 ENCOUNTER — Other Ambulatory Visit: Payer: Self-pay | Admitting: Endocrinology

## 2019-06-16 DIAGNOSIS — M8580 Other specified disorders of bone density and structure, unspecified site: Secondary | ICD-10-CM

## 2019-07-16 ENCOUNTER — Telehealth (HOSPITAL_COMMUNITY): Payer: Self-pay | Admitting: *Deleted

## 2019-07-16 ENCOUNTER — Other Ambulatory Visit: Payer: Self-pay | Admitting: Endocrinology

## 2019-07-16 DIAGNOSIS — M859 Disorder of bone density and structure, unspecified: Secondary | ICD-10-CM

## 2019-07-16 DIAGNOSIS — M8580 Other specified disorders of bone density and structure, unspecified site: Secondary | ICD-10-CM

## 2019-07-16 NOTE — Telephone Encounter (Signed)
Attempted to call patient regarding upcoming cardiac MRI appointment. Left message on voicemail with name and callback number  Tiyon Sanor Tai RN Navigator Cardiac Imaging [facility] [phone] Office [phone] Cell  

## 2019-07-16 NOTE — Telephone Encounter (Signed)
Pt and pt's husband returning call regarding cardiac imaging study; pt verbalizes understanding of appt date/time, parking situation and where to check in, and verified current allergies; name and call back number provided for further questions should they arise  Burley Saver RN Navigator Cardiac [facility] [phone] office [phone] cell

## 2019-07-17 ENCOUNTER — Other Ambulatory Visit: Payer: Self-pay

## 2019-07-17 ENCOUNTER — Ambulatory Visit (HOSPITAL_COMMUNITY)
Admission: RE | Admit: 2019-07-17 | Discharge: 2019-07-17 | Disposition: A | Discharge status: Home / Self Care | Payer: Medicare HMO | Source: Ambulatory Visit | Attending: Cardiology | Admitting: Cardiology

## 2019-07-17 DIAGNOSIS — I5022 Chronic systolic (congestive) heart failure: Secondary | ICD-10-CM | POA: Diagnosis not present

## 2019-07-17 MED ORDER — GADOBUTROL 1 MMOL/ML IV SOLN
6.0000 mL | Freq: Once | INTRAVENOUS | Status: AC | PRN
  Administered 2019-07-17: 6 mL via INTRAVENOUS

## 2019-07-18 ENCOUNTER — Ambulatory Visit
Admission: RE | Admit: 2019-07-18 | Discharge: 2019-07-18 | Disposition: A | Discharge status: Home / Self Care | Payer: Medicare HMO | Source: Ambulatory Visit | Attending: Endocrinology | Admitting: Endocrinology

## 2019-07-18 DIAGNOSIS — M8580 Other specified disorders of bone density and structure, unspecified site: Secondary | ICD-10-CM

## 2019-07-18 DIAGNOSIS — M859 Disorder of bone density and structure, unspecified: Secondary | ICD-10-CM

## 2019-07-21 ENCOUNTER — Telehealth: Payer: Self-pay | Admitting: *Deleted

## 2019-07-21 NOTE — Telephone Encounter (Signed)
-----   Message from Arnoldo Lenis, MD sent at 07/21/2019  2:33 PM EDT ----- Cardiac MRI shows heart pumping function remains decreased, there was no evidence of active the active heart inflammation or muscle disease we were looking for. COntinue current meds   Zandra Abts MD

## 2019-07-28 ENCOUNTER — Ambulatory Visit (HOSPITAL_COMMUNITY)
Admission: RE | Admit: 2019-07-28 | Discharge: 2019-07-28 | Disposition: A | Discharge status: Home / Self Care | Payer: Medicare HMO | Source: Ambulatory Visit | Attending: Endocrinology | Admitting: Endocrinology

## 2019-07-28 ENCOUNTER — Other Ambulatory Visit: Payer: Self-pay

## 2019-07-28 DIAGNOSIS — M8580 Other specified disorders of bone density and structure, unspecified site: Secondary | ICD-10-CM | POA: Diagnosis present

## 2019-07-28 DIAGNOSIS — M810 Age-related osteoporosis without current pathological fracture: Secondary | ICD-10-CM | POA: Insufficient documentation

## 2019-07-29 NOTE — Telephone Encounter (Signed)
Pt voiced understanding - routed to pcp - July f/u already scheduled

## 2019-08-04 ENCOUNTER — Other Ambulatory Visit (HOSPITAL_COMMUNITY): Payer: Medicare HMO

## 2019-09-02 ENCOUNTER — Encounter: Payer: Self-pay | Admitting: Cardiology

## 2019-09-02 ENCOUNTER — Other Ambulatory Visit: Payer: Self-pay

## 2019-09-02 ENCOUNTER — Ambulatory Visit: Payer: Medicare HMO | Admitting: Cardiology

## 2019-09-02 VITALS — BP 108/62 | HR 62 | Ht 61.0 in | Wt 120.0 lb

## 2019-09-02 DIAGNOSIS — I5022 Chronic systolic (congestive) heart failure: Secondary | ICD-10-CM | POA: Diagnosis not present

## 2019-09-02 DIAGNOSIS — Z79899 Other long term (current) drug therapy: Secondary | ICD-10-CM

## 2019-09-02 MED ORDER — SPIRONOLACTONE 25 MG PO TABS: 12.5000 mg | ORAL_TABLET | Freq: Every day | ORAL | 3 refills | Status: DC

## 2019-09-02 NOTE — Progress Notes (Signed)
Clinical Summary Ms. Wurster is a 69 y.o.female seen today for follow up of the following medical problems.    1. Chronic systolic HF - new diagnosis during 10/2018 admission with syncope. Echo showed LVEF 35-40% - no recent SOb/DOE. Compliant with meds - bp is 114/75, HR 64 today   Jan 2021 echo LVEF 35-40%, mild to mod MR.  04/2019 no significant CAD, normal filling pressures. CI 3.7 Mean PA 15, PCWP 7 07/2019 CMRI: LVEF 38%, normal RV. No infiltrative disease  - no recent SOB/DOE/LE edema - compliant with meds  2.ChronicLBBB - no significant CAD by cath 04/2019    3. Memory deficit/Dementia - on aricept, followed by neuro -remains active, independent. Enjoys doing yard and housework.   Has not had covid vaccine, reports pcp has concerns about allergy and monitoring data at this time.  Husband has had vaccine.    Past Medical History:  Diagnosis Date  . Atrial fibrillation ([place])   . Bloating   . Complication of anesthesia     " hard to wake up sometimes "  . Dementia ([place])   . Diverticula of colon   . Epigastric burning sensation   . GERD (gastroesophageal reflux disease)   . Headache disorder 06/03/2014  . Headache(784.0)   . Helicobacter pylori gastritis 2004  . Hemorrhoids 06/26/03   Dr. Laural Golden tcs  . Hiatal hernia 06/26/03   Dr. Laural Golden egd  . Hypertension   . Hypothyroidism   . 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 Memory difficulty 06/03/2014  . Shoulder fracture, left   . Spastic colon   . Wrist fracture      Allergies  Allergen Reactions  . Penicillins Hives and Swelling    Has patient had a PCN reaction causing IMMEDIATE RASH, FACIAL/TONGUE/THROAT SWELLING, SOB, OR LIGHTHEADEDNESS WITH HYPOTENSION:  #  #  #  YES  #  #  #  Has patient had a PCN reaction causing severe rash involving mucus membranes or skin necrosis: No Has patient had a PCN reaction that required hospitalization No Has patient had a PCN  reaction occurring within the last 10 years: No If all of the above answers are "NO", then may proceed with Cephalosporin use.   . Benadryl [Diphenhydramine Hcl] Hives  . Msm [Methylsulfonylmethane] Hives  . Nubain [Nalbuphine Hcl] Hives  . Sulfa Antibiotics Hives  . Codeine     UNSPECIFIED REACTION [IF AT ALL] REFUSES TO TAKE     Current Outpatient Medications  Medication Sig Dispense Refill  . acetaminophen (TYLENOL) 500 MG tablet Take 1,000 mg by mouth every 6 (six) hours as needed (headaches.).    Marland Kitchen alendronate (FOSAMAX) 70 MG tablet Take 70 mg by mouth every Tuesday.     Marland Kitchen aspirin EC 81 MG tablet Take 81 mg by mouth daily.    . Calcium Carb-Cholecalciferol (CALCIUM 600+D3 PO) Take 1 tablet by mouth daily with supper.    . carvedilol (COREG) 12.5 MG tablet Take 1 tablet (12.5 mg total) by mouth 2 (two) times daily. 180 tablet 3  . Cyanocobalamin (B-12 PO) Take 6,000 mcg by mouth daily.    . diphenoxylate-atropine (LOMOTIL) 2.5-0.025 MG tablet Take 1 tablet by mouth 4 (four) times daily as needed. For diarrhea (Patient taking differently: Take 1 tablet by mouth in the morning, at noon, and at bedtime. For diarrhea) 240 tablet 3  . donepezil (ARICEPT) 10 MG tablet Take 1 tablet (10 mg total) by mouth  at bedtime. 90 tablet 3  . escitalopram (LEXAPRO) 10 MG tablet Take 10 mg by mouth daily with supper.   0  . memantine (NAMENDA) 10 MG tablet Take 1 tablet (10 mg total) by mouth 2 (two) times daily. 180 tablet 3  . pyridostigmine (MESTINON) 60 MG tablet Take 30 mg by mouth at bedtime.     . sacubitril-valsartan (ENTRESTO) 24-26 MG Take 1 tablet by mouth 2 (two) times daily. 60 tablet 6  . Specialty Vitamins Products (BRAIN PO) Take 1 tablet by mouth daily. CogniSense Supplement    . SYNTHROID 88 MCG tablet Take 88 mcg by mouth See admin instructions. Take 1 tablet (88 mcg) by mouth on Sundays, Mondays, Tuesdays, Wednesdays,Thursdays, & Saturdays at bedtime, DOES NOT TAKE ANY Fridays.      Current Facility-Administered Medications  Medication Dose Route Frequency Provider Last Rate Last Admin  . sodium chloride flush (NS) 0.9 % injection 3 mL  3 mL Intravenous Q12H Inas Avena, Alphonse Guild, MD         Past Surgical History:  Procedure Laterality Date  . APPENDECTOMY    . CHOLECYSTECTOMY    . COLONOSCOPY  06/26/03   Dr. Kem Boroughs diverticula at the sigmoid colon with one of the transverse colon, normal terminal ileoscopy, small external hemorrhoids the  . COLONOSCOPY N/A 01/20/2016   sigmoid diverticulosis, otherwise normal.   . ESOPHAGOGASTRODUODENOSCOPY  03/15/2011   Dr. Trevor Iha hiatal hernia, abnormal gastric mucosa of unclear significance. Gastric biopsies negative, no H. pylori.Procedure: ESOPHAGOGASTRODUODENOSCOPY (EGD);  Surgeon: Daneil Dolin, MD;  Location[facility];  Service: Endoscopy;  Laterality: N/A;  7:30  . ORIF HUMERUS FRACTURE Left 05/29/2016   Procedure: LEFT OPEN REDUCTION INTERNAL FIXATION (ORIF) PROXIMAL HUMERUS FRACTURE;  Surgeon: Meredith Pel, MD;  Location: [facility];  Service: Orthopedics;  Laterality: Left;  . ORIF WRIST FRACTURE Left 05/29/2016   Procedure: OPEN REDUCTION INTERNAL FIXATION (ORIF) LEFT WRIST FRACTURE;  Surgeon: Meredith Pel, MD;  Location: [facility];  Service: Orthopedics;  Laterality: Left;  . RIGHT/LEFT HEART CATH AND CORONARY ANGIOGRAPHY N/A 04/17/2019   Procedure: RIGHT/LEFT HEART CATH AND CORONARY ANGIOGRAPHY;  Surgeon: Belva Crome, MD;  Location: [facility];  Service: Cardiovascular;  Laterality: N/A;  . TUBAL LIGATION    . UPPER GASTROINTESTINAL ENDOSCOPY  06/26/03   Dr. Wynelle Bourgeois sliding hiatal hernia with 8 mm tongue of gastric type mucosa at distal esophagus bile in the stomach.     Allergies  Allergen Reactions  . Penicillins Hives and Swelling    Has patient had a PCN reaction causing IMMEDIATE RASH, FACIAL/TONGUE/THROAT SWELLING, SOB, OR LIGHTHEADEDNESS WITH HYPOTENSION:  #  #  #  YES  #   #  #  Has patient had a PCN reaction causing severe rash involving mucus membranes or skin necrosis: No Has patient had a PCN reaction that required hospitalization No Has patient had a PCN reaction occurring within the last 10 years: No If all of the above answers are "NO", then may proceed with Cephalosporin use.   . Benadryl [Diphenhydramine Hcl] Hives  . Msm [Methylsulfonylmethane] Hives  . Nubain [Nalbuphine Hcl] Hives  . Sulfa Antibiotics Hives  . Codeine     UNSPECIFIED REACTION [IF AT ALL] REFUSES TO TAKE      Family History  Problem Relation Age of Onset  . Hypertension Mother   . Angina Mother   . COPD Father   . Heart failure Father   . Cancer Sister   .  Dementia Sister   . Cardiomyopathy Brother   . Pneumonia Other   . Migraines Sister   . Colon cancer Neg Hx      Social History Ms. Guyton reports that she has been smoking cigarettes. She started smoking about 53 years ago. She has a 45.00 pack-year smoking history. She has never used smokeless tobacco. Ms. Marrone reports no history of alcohol use.   Review of Systems CONSTITUTIONAL: No weight loss, fever, chills, weakness or fatigue.  HEENT: Eyes: No visual loss, blurred vision, double vision or yellow sclerae.No hearing loss, sneezing, congestion, runny nose or sore throat.  SKIN: No rash or itching.  CARDIOVASCULAR: per hpi RESPIRATORY: No shortness of breath, cough or sputum.  GASTROINTESTINAL: No anorexia, nausea, vomiting or diarrhea. No abdominal pain or blood.  GENITOURINARY: No burning on urination, no polyuria NEUROLOGICAL: No headache, dizziness, syncope, paralysis, ataxia, numbness or tingling in the extremities. No change in bowel or bladder control.  MUSCULOSKELETAL: No muscle, back pain, joint pain or stiffness.  LYMPHATICS: No enlarged nodes. No history of splenectomy.  PSYCHIATRIC: No history of depression or anxiety.  ENDOCRINOLOGIC: No reports of sweating, cold or heat intolerance.  No polyuria or polydipsia.  Marland Kitchen   Physical Examination Today's Vitals   09/02/19 1358  BP: 108/62  Pulse: 62  SpO2: 97%  Weight: 120 lb (54.4 kg)  Height: 5\' 1"  (1.549 m)   Body mass index is 22.67 kg/m.  Gen: resting comfortably, no acute distress HEENT: no scleral icterus, pupils equal round and reactive, no palptable cervical adenopathy,  CV: RRR, no m/r/g, no jvd Resp: Clear to auscultation bilaterally GI: abdomen is soft, non-tender, non-distended, normal bowel sounds, no hepatosplenomegaly MSK: extremities are warm, no edema.  Skin: warm, no rash Neuro:  no focal deficits Psych: appropriate affect   Diagnostic Studies 04/2013 Lexiscan MPI FINDINGS:  The patient's initial EKG revealed a narrow QRS. After she received  Lexiscan, the heart rate increased to 145 and the QRS widened to a  left bundle Kenichi Cassada block. The increased rate persisted for several  minutes but then began to slow. When the heart rate was slower, P  waves could be seen. There were no flutter waves. The raw data  reveals no evidence of excess motion. Tomographic images with stress  reveal a small area of moderate decreased uptake in the septum. Rest  images reveal a moderate area of decreased uptake in the septum.  These findings are most consistent with left bundle Oryon Gary block.  Wall motion analysis reveals excellent motion with an ejection  fraction of 70%.  IMPRESSION:  This is a low risk scan. There is no scar or ischemia. There is  decreased activity in the septum, consistent with left bundle Catalia Massett  block. It is noted that the QRS was narrow at the start of the  study. However with Lexiscan, the patient developed tachycardia with  left bundle Ethyn Schetter block. Assessment shows that this was most  probably sinus tachycardia which slowed over time.  10/2018 echo IMPRESSIONS   1. The left ventricle has moderately reduced systolic function, with an ejection fraction of  35-40%. The cavity size was normal. Severe focal basal septal hypertrophy.. Diastolic dysfunction, grade indeterminate. Left ventricular diffuse hypokinesis. 2. The right ventricle has normal systolic function. The cavity was normal. There is no increase in right ventricular wall thickness. 3. The aortic valve is tricuspid. Mild thickening of the aortic valve. Mild aortic annular calcification noted. 4. The mitral valve is grossly normal. Mild thickening  of the mitral valve leaflet. Mitral valve regurgitation is mild to moderate by color flow Doppler. 5. The tricuspid valve is grossly normal. 6. The aorta is normal unless otherwise noted.  12/2018 30 day event monitor  30 day event monitor  Min HR 57, Max HR 130, Avg HR 79  No symptoms reported  Telemetry tracings show sinus rhythm with occasional PVCs.  No significant arrhythmias  Jan 2021 echo IMPRESSIONS    1. Left ventricular ejection fraction, by visual estimation, is 35 to  40%. The left ventricle has moderately decreased function. There is no  left ventricular hypertrophy.  2. Left ventricular diastolic parameters are indeterminate.  3. The left ventricle demonstrates global hypokinesis.  4. Global right ventricle has normal systolic function.The right  ventricular size is normal. No increase in right ventricular wall  thickness.  5. Left atrial size was normal.  6. Right atrial size was normal.  7. The mitral valve is normal in structure. Mild to moderate mitral valve  regurgitation. No evidence of mitral stenosis.  8. The tricuspid valve is normal in structure.  9. The aortic valve has an indeterminant number of cusps. Aortic valve  regurgitation is not visualized. No evidence of aortic valve sclerosis or  stenosis.  10. The pulmonic valve was not well visualized. Pulmonic valve  regurgitation is not visualized.  11. The inferior vena cava is normal in size with greater than 50%  respiratory  variability, suggesting right atrial pressure of 3 mmHg.     04/2019 RHC/LHC  Normal right heart pressures. Cardiac output 3.7 L/min. Cardiac index 2.5 L/min/m  Normal left main  Transapical LAD. First diagonal contains segmental proximal 40% narrowing.  Normal small ramus.  Circumflex is normal.  The right coronary demonstrates generalized minimal atherosclerosis. No focal stenosis.  Anterior and apical mild to moderate hypokinesis. Estimated ejection fraction 35 to 40%.  RECOMMENDATIONS:   Guideline directed therapy for left ventricular systolic dysfunction in the setting of left bundle Leopoldo Mazzie block. If no improvement in LV function with therapy, consider resync.    Assessment and Plan  1.Chronic systolic HF - new diagnosis during 10/2018 admission -no recent symptosm - soft bp's somewhat limited medical therapy, start aldactone 12.5mg  daily - check BMET in 2 weeks - CMRI 07/2019 LVEF still 38%, likely repaet echo around October.   F/u 6 weeks, work to titrate CHF meds as tolerated    Arnoldo Lenis, M.D.

## 2019-09-02 NOTE — Patient Instructions (Signed)
Medication Instructions:  START ALDACTONE 12.5 MG DAILY  *If you need a refill on your cardiac medications before your next appointment, please call your pharmacy*   Lab Work: BMET IN 2 WEEKS   If you have labs (blood work) drawn today and your tests are completely normal, you will receive your results only by: Marland Kitchen MyChart Message (if you have MyChart) OR . A paper copy in the mail If you have any lab test that is abnormal or we need to change your treatment, we will call you to review the results.   Testing/Procedures: NONE TODAY   Follow-Up: At [facility], you and your health needs are our priority.  As part of our continuing mission to provide you with exceptional heart care, we have created designated Provider Care Teams.  These Care Teams include your primary Cardiologist (physician) and Advanced Practice Providers (APPs -  Physician Assistants and Nurse Practitioners) who all work together to provide you with the care you need, when you need it.  We recommend signing up for the patient portal called "MyChart".  Sign up information is provided on this After Visit Summary.  MyChart is used to connect with patients for Virtual Visits (Telemedicine).  Patients are able to view lab/test results, encounter notes, upcoming appointments, etc.  Non-urgent messages can be sent to your provider as well.   To learn more about what you can do with MyChart, go to [email or web address].    Your next appointment:   6 week(s)  The format for your next appointment:   In Person  Provider:   You may see Carlyle Dolly, MD or one of the following Advanced Practice Providers on your designated Care Team:    Bernerd Pho, PA-C   Ermalinda Barrios, [place]     Other Instructions NONE     Thank you for choosing [facility] !

## 2019-09-11 ENCOUNTER — Ambulatory Visit: Payer: Medicare HMO | Admitting: Adult Health

## 2019-09-16 ENCOUNTER — Other Ambulatory Visit (HOSPITAL_COMMUNITY)
Admission: RE | Admit: 2019-09-16 | Discharge: 2019-09-16 | Disposition: A | Discharge status: Home / Self Care | Payer: Medicare HMO | Source: Ambulatory Visit | Attending: Cardiology | Admitting: Cardiology

## 2019-09-16 DIAGNOSIS — Z79899 Other long term (current) drug therapy: Secondary | ICD-10-CM | POA: Diagnosis present

## 2019-09-16 LAB — BASIC METABOLIC PANEL
Anion gap: 9 (ref 5–15)
BUN: 19 mg/dL (ref 8–23)
CO2: 24 mmol/L (ref 22–32)
Calcium: 9.2 mg/dL (ref 8.9–10.3)
Chloride: 97 mmol/L — ABNORMAL LOW (ref 98–111)
Creatinine, Ser: 1.43 mg/dL — ABNORMAL HIGH (ref 0.44–1.00)
GFR calc Af Amer: 44 mL/min — ABNORMAL LOW (ref >60)
GFR calc non Af Amer: 38 mL/min — ABNORMAL LOW (ref >60)
Glucose, Bld: 106 mg/dL — ABNORMAL HIGH (ref 70–99)
Potassium: 4.4 mmol/L (ref 3.5–5.1)
Sodium: 130 mmol/L — ABNORMAL LOW (ref 135–145)

## 2019-09-30 ENCOUNTER — Telehealth: Payer: Self-pay | Admitting: *Deleted

## 2019-09-30 DIAGNOSIS — I10 Essential (primary) hypertension: Secondary | ICD-10-CM

## 2019-09-30 NOTE — Telephone Encounter (Signed)
-----   Message from Arnoldo Lenis, MD sent at 09/29/2019  7:43 PM EDT ----- Labs how some signs of dehydration, probably from the aldactone. Stop aldactone, repeat labs 2 weeks bmet  Zandra Abts MD

## 2019-09-30 NOTE — Telephone Encounter (Signed)
Pt voiced understanding - updated medication list - lab orders placed

## 2019-10-13 ENCOUNTER — Other Ambulatory Visit (HOSPITAL_COMMUNITY)
Admission: RE | Admit: 2019-10-13 | Discharge: 2019-10-13 | Disposition: A | Discharge status: Home / Self Care | Payer: Medicare HMO | Source: Ambulatory Visit | Attending: Cardiology | Admitting: Cardiology

## 2019-10-13 DIAGNOSIS — I10 Essential (primary) hypertension: Secondary | ICD-10-CM | POA: Diagnosis present

## 2019-10-13 LAB — BASIC METABOLIC PANEL
Anion gap: 8 (ref 5–15)
BUN: 15 mg/dL (ref 8–23)
CO2: 27 mmol/L (ref 22–32)
Calcium: 8.9 mg/dL (ref 8.9–10.3)
Chloride: 95 mmol/L — ABNORMAL LOW (ref 98–111)
Creatinine, Ser: 1.27 mg/dL — ABNORMAL HIGH (ref 0.44–1.00)
GFR calc Af Amer: 51 mL/min — ABNORMAL LOW (ref >60)
GFR calc non Af Amer: 44 mL/min — ABNORMAL LOW (ref >60)
Glucose, Bld: 74 mg/dL (ref 70–99)
Potassium: 4 mmol/L (ref 3.5–5.1)
Sodium: 130 mmol/L — ABNORMAL LOW (ref 135–145)

## 2019-10-14 ENCOUNTER — Encounter: Payer: Self-pay | Admitting: Cardiology

## 2019-10-14 ENCOUNTER — Ambulatory Visit: Payer: Medicare HMO | Admitting: Cardiology

## 2019-10-14 ENCOUNTER — Other Ambulatory Visit: Payer: Self-pay

## 2019-10-14 VITALS — BP 102/60 | HR 66 | Ht 61.0 in | Wt 124.0 lb

## 2019-10-14 DIAGNOSIS — I5022 Chronic systolic (congestive) heart failure: Secondary | ICD-10-CM

## 2019-10-14 NOTE — Progress Notes (Signed)
Clinical Summary Melanie Bradley is a 69 y.o.female seen today for follow up of the following medical problems.   1. Chronic systolic HF - new diagnosis during 10/2018 admission with syncope. Echo showed LVEF 35-40% - no recent SOb/DOE. Compliant with meds - bp is 114/75, HR 64 today   Jan 2021 echo LVEF 35-40%, mild to mod MR.  04/2019 no significant CAD, normal filling pressures. CI 3.7 Mean PA 15, PCWP 7 07/2019 CMRI: LVEF 38%, normal RV. No infiltrative disease   - started aldactone 12.5mg  daily last visit. On follow up labs Na down to 130, Cr up to 1.4 - stopped aldactone, Na stable and Cr trending back down - no recent SOB or DOE    2.ChronicLBBB - no significant CAD by cath 04/2019    3. Memory deficit/Dementia - on aricept, followed by neuro -remains active, independent. Enjoys doing yard and housework.    Husband has had vaccine. She just completed the covid vaccine, had some initial hesitancy given prior allergy issues.   Past Medical History:  Diagnosis Date  . Atrial fibrillation ([place])   . Bloating   . Complication of anesthesia     " hard to wake up sometimes "  . Dementia ([place])   . Diverticula of colon   . Epigastric burning sensation   . GERD (gastroesophageal reflux disease)   . Headache disorder 06/03/2014  . Headache(784.0)   . Helicobacter pylori gastritis 2004  . Hemorrhoids 06/26/03   Dr. Laural Golden tcs  . Hiatal hernia 06/26/03   Dr. Laural Golden egd  . Hypertension   . Hypothyroidism   . 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 Memory difficulty 06/03/2014  . Shoulder fracture, left   . Spastic colon   . Wrist fracture      Allergies  Allergen Reactions  . Penicillins Hives and Swelling    Has patient had a PCN reaction causing IMMEDIATE RASH, FACIAL/TONGUE/THROAT SWELLING, SOB, OR LIGHTHEADEDNESS WITH HYPOTENSION:  #  #  #  YES  #  #  #  Has patient had a PCN reaction causing severe rash involving mucus  membranes or skin necrosis: No Has patient had a PCN reaction that required hospitalization No Has patient had a PCN reaction occurring within the last 10 years: No If all of the above answers are "NO", then may proceed with Cephalosporin use.   . Benadryl [Diphenhydramine Hcl] Hives  . Msm [Methylsulfonylmethane] Hives  . Nubain [Nalbuphine Hcl] Hives  . Sulfa Antibiotics Hives  . Codeine     UNSPECIFIED REACTION [IF AT ALL] REFUSES TO TAKE     Current Outpatient Medications  Medication Sig Dispense Refill  . acetaminophen (TYLENOL) 500 MG tablet Take 1,000 mg by mouth every 6 (six) hours as needed (headaches.).    Marland Kitchen alendronate (FOSAMAX) 70 MG tablet Take 70 mg by mouth every Tuesday.     Marland Kitchen aspirin EC 81 MG tablet Take 81 mg by mouth daily.    . Calcium Carb-Cholecalciferol (CALCIUM 600+D3 PO) Take 1 tablet by mouth daily with supper.    . carvedilol (COREG) 12.5 MG tablet Take 1 tablet (12.5 mg total) by mouth 2 (two) times daily. 180 tablet 3  . Cyanocobalamin (B-12 PO) Take 6,000 mcg by mouth daily.    Marland Kitchen donepezil (ARICEPT) 10 MG tablet Take 1 tablet (10 mg total) by mouth at bedtime. 90 tablet 3  . escitalopram (LEXAPRO) 10 MG tablet Take 10 mg by mouth  daily with supper.   0  . memantine (NAMENDA) 10 MG tablet Take 1 tablet (10 mg total) by mouth 2 (two) times daily. 180 tablet 3  . pyridostigmine (MESTINON) 60 MG tablet Take 30 mg by mouth at bedtime.     . sacubitril-valsartan (ENTRESTO) 24-26 MG Take 1 tablet by mouth 2 (two) times daily. 60 tablet 6  . Specialty Vitamins Products (BRAIN PO) Take 1 tablet by mouth daily. CogniSense Supplement    . SYNTHROID 88 MCG tablet Take 88 mcg by mouth See admin instructions. Take 1 tablet (88 mcg) by mouth on Sundays, Mondays, Tuesdays, Wednesdays,Thursdays, & Saturdays at bedtime, DOES NOT TAKE ANY Fridays.     Current Facility-Administered Medications  Medication Dose Route Frequency Provider Last Rate Last Admin  . sodium chloride  flush (NS) 0.9 % injection 3 mL  3 mL Intravenous Q12H Melanie Bradley, Melanie Guild, MD         Past Surgical History:  Procedure Laterality Date  . APPENDECTOMY    . CHOLECYSTECTOMY    . COLONOSCOPY  06/26/03   Dr. Kem Boroughs diverticula at the sigmoid colon with one of the transverse colon, normal terminal ileoscopy, small external hemorrhoids the  . COLONOSCOPY N/A 01/20/2016   sigmoid diverticulosis, otherwise normal.   . ESOPHAGOGASTRODUODENOSCOPY  03/15/2011   Dr. Trevor Iha hiatal hernia, abnormal gastric mucosa of unclear significance. Gastric biopsies negative, no H. pylori.Procedure: ESOPHAGOGASTRODUODENOSCOPY (EGD);  Surgeon: Daneil Dolin, MD;  [facility];  Service: Endoscopy;  Laterality: N/A;  7:30  . ORIF HUMERUS FRACTURE Left 05/29/2016   Procedure: LEFT OPEN REDUCTION INTERNAL FIXATION (ORIF) PROXIMAL HUMERUS FRACTURE;  Surgeon: Meredith Pel, MD;  Location: [place];  Service: Orthopedics;  Laterality: Left;  . ORIF WRIST FRACTURE Left 05/29/2016   Procedure: OPEN REDUCTION INTERNAL FIXATION (ORIF) LEFT WRIST FRACTURE;  Surgeon: Meredith Pel, MD;  Location: [place];  Service: Orthopedics;  Laterality: Left;  . RIGHT/LEFT HEART CATH AND CORONARY ANGIOGRAPHY N/A 04/17/2019   Procedure: RIGHT/LEFT HEART CATH AND CORONARY ANGIOGRAPHY;  Surgeon: Belva Crome, MD;  Location: [place] [facility];  Service: Cardiovascular;  Laterality: N/A;  . TUBAL LIGATION    . UPPER GASTROINTESTINAL ENDOSCOPY  06/26/03   Dr. Wynelle Bourgeois sliding hiatal hernia with 8 mm tongue of gastric type mucosa at distal esophagus bile in the stomach.     Allergies  Allergen Reactions  . Penicillins Hives and Swelling    Has patient had a PCN reaction causing IMMEDIATE RASH, FACIAL/TONGUE/THROAT SWELLING, SOB, OR LIGHTHEADEDNESS WITH HYPOTENSION:  #  #  #  YES  #  #  #  Has patient had a PCN reaction causing severe rash involving mucus membranes or skin necrosis: No Has patient had a PCN  reaction that required hospitalization No Has patient had a PCN reaction occurring within the last 10 years: No If all of the above answers are "NO", then may proceed with Cephalosporin use.   . Benadryl [Diphenhydramine Hcl] Hives  . Msm [Methylsulfonylmethane] Hives  . Nubain [Nalbuphine Hcl] Hives  . Sulfa Antibiotics Hives  . Codeine     UNSPECIFIED REACTION [IF AT ALL] REFUSES TO TAKE      Family History  Problem Relation Age of Onset  . Hypertension Mother   . Angina Mother   . COPD Father   . Heart failure Father   . Cancer Sister   . Dementia Sister   . Cardiomyopathy Brother   . Pneumonia Other   . Migraines Sister   .  Colon cancer Neg Hx      Social History Ms. Hausner reports that she has been smoking cigarettes. She started smoking about 53 years ago. She has a 45.00 pack-year smoking history. She has never used smokeless tobacco. Ms. Runde reports no history of alcohol use.   Review of Systems CONSTITUTIONAL: No weight loss, fever, chills, weakness or fatigue.  HEENT: Eyes: No visual loss, blurred vision, double vision or yellow sclerae.No hearing loss, sneezing, congestion, runny nose or sore throat.  SKIN: No rash or itching.  CARDIOVASCULAR: per hpi RESPIRATORY: No shortness of breath, cough or sputum.  GASTROINTESTINAL: No anorexia, nausea, vomiting or diarrhea. No abdominal pain or blood.  GENITOURINARY: No burning on urination, no polyuria NEUROLOGICAL: No headache, dizziness, syncope, paralysis, ataxia, numbness or tingling in the extremities. No change in bowel or bladder control.  MUSCULOSKELETAL: No muscle, back pain, joint pain or stiffness.  LYMPHATICS: No enlarged nodes. No history of splenectomy.  PSYCHIATRIC: No history of depression or anxiety.  ENDOCRINOLOGIC: No reports of sweating, cold or heat intolerance. No polyuria or polydipsia.  Marland Kitchen   Physical Examination Today's Vitals   10/14/19 0844  BP: 102/60  Pulse: 66  SpO2: 99%   Weight: 124 lb (56.2 kg)  Height: 5\' 1"  (1.549 m)   Body mass index is 23.43 kg/m.  Gen: resting comfortably, no acute distress HEENT: no scleral icterus, pupils equal round and reactive, no palptable cervical adenopathy,  CV: RRR, no m/r/g, no jvd Resp: Clear to auscultation bilaterally GI: abdomen is soft, non-tender, non-distended, normal bowel sounds, no hepatosplenomegaly MSK: extremities are warm, no edema.  Skin: warm, no rash Neuro:  no focal deficits Psych: appropriate affect   Diagnostic Studies  04/2013 Lexiscan MPI FINDINGS:  The patient's initial EKG revealed a narrow QRS. After she received  Lexiscan, the heart rate increased to 145 and the QRS widened to a  left bundle Jeyden Coffelt block. The increased rate persisted for several  minutes but then began to slow. When the heart rate was slower, P  waves could be seen. There were no flutter waves. The raw data  reveals no evidence of excess motion. Tomographic images with stress  reveal a small area of moderate decreased uptake in the septum. Rest  images reveal a moderate area of decreased uptake in the septum.  These findings are most consistent with left bundle Orvil Faraone block.  Wall motion analysis reveals excellent motion with an ejection  fraction of 70%.  IMPRESSION:  This is a low risk scan. There is no scar or ischemia. There is  decreased activity in the septum, consistent with left bundle Story Conti  block. It is noted that the QRS was narrow at the start of the  study. However with Lexiscan, the patient developed tachycardia with  left bundle Ieesha Abbasi block. Assessment shows that this was most  probably sinus tachycardia which slowed over time.  10/2018 echo IMPRESSIONS   1. The left ventricle has moderately reduced systolic function, with an ejection fraction of 35-40%. The cavity size was normal. Severe focal basal septal hypertrophy.. Diastolic dysfunction, grade indeterminate. Left  ventricular diffuse hypokinesis. 2. The right ventricle has normal systolic function. The cavity was normal. There is no increase in right ventricular wall thickness. 3. The aortic valve is tricuspid. Mild thickening of the aortic valve. Mild aortic annular calcification noted. 4. The mitral valve is grossly normal. Mild thickening of the mitral valve leaflet. Mitral valve regurgitation is mild to moderate by color flow Doppler. 5. The tricuspid  valve is grossly normal. 6. The aorta is normal unless otherwise noted.  12/2018 30 day event monitor  30 day event monitor  Min HR 57, Max HR 130, Avg HR 79  No symptoms reported  Telemetry tracings show sinus rhythm with occasional PVCs.  No significant arrhythmias  Jan 2021 echo IMPRESSIONS    1. Left ventricular ejection fraction, by visual estimation, is 35 to  40%. The left ventricle has moderately decreased function. There is no  left ventricular hypertrophy.  2. Left ventricular diastolic parameters are indeterminate.  3. The left ventricle demonstrates global hypokinesis.  4. Global right ventricle has normal systolic function.The right  ventricular size is normal. No increase in right ventricular wall  thickness.  5. Left atrial size was normal.  6. Right atrial size was normal.  7. The mitral valve is normal in structure. Mild to moderate mitral valve  regurgitation. No evidence of mitral stenosis.  8. The tricuspid valve is normal in structure.  9. The aortic valve has an indeterminant number of cusps. Aortic valve  regurgitation is not visualized. No evidence of aortic valve sclerosis or  stenosis.  10. The pulmonic valve was not well visualized. Pulmonic valve  regurgitation is not visualized.  11. The inferior vena cava is normal in size with greater than 50%  respiratory variability, suggesting right atrial pressure of 3 mmHg.     04/2019 RHC/LHC  Normal right heart pressures. Cardiac output  3.7 L/min. Cardiac index 2.5 L/min/m  Normal left main  Transapical LAD. First diagonal contains segmental proximal 40% narrowing.  Normal small ramus.  Circumflex is normal.  The right coronary demonstrates generalized minimal atherosclerosis. No focal stenosis.  Anterior and apical mild to moderate hypokinesis. Estimated ejection fraction 35 to 40%.  RECOMMENDATIONS:   Guideline directed therapy for left ventricular systolic dysfunction in the setting of left bundle Susumu Hackler block. If no improvement in LV function with therapy, consider resync.   Assessment and Plan   1.Chronic systolic HF - new diagnosis during 10/2018 admission - soft bp's somewhat limited medical therapy - uptrend in Cr on low dose aldactone, we have discontinued and Cr back down - essentially on her maximally tolerated regimen at this time, continue current therapy   F/u 4 months     Arnoldo Lenis, M.D.

## 2019-10-14 NOTE — Patient Instructions (Signed)
Medication Instructions:  Your physician recommends that you continue on your current medications as directed. Please refer to the Current Medication list given to you today.  *If you need a refill on your cardiac medications before your next appointment, please call your pharmacy*   Lab Work: NONE  If you have labs (blood work) drawn today and your tests are completely normal, you will receive your results only by: . MyChart Message (if you have MyChart) OR . A paper copy in the mail If you have any lab test that is abnormal or we need to change your treatment, we will call you to review the results.   Testing/Procedures: NONE    Follow-Up: At [facility], you and your health needs are our priority.  As part of our continuing mission to provide you with exceptional heart care, we have created designated Provider Care Teams.  These Care Teams include your primary Cardiologist (physician) and Advanced Practice Providers (APPs -  Physician Assistants and Nurse Practitioners) who all work together to provide you with the care you need, when you need it.  We recommend signing up for the patient portal called "MyChart".  Sign up information is provided on this After Visit Summary.  MyChart is used to connect with patients for Virtual Visits (Telemedicine).  Patients are able to view lab/test results, encounter notes, upcoming appointments, etc.  Non-urgent messages can be sent to your provider as well.   To learn more about what you can do with MyChart, go to [email or web address].    Your next appointment:   4 month(s)  The format for your next appointment:   In Person  Provider:   Jonathan Branch, MD   Other Instructions Thank you for choosing [facility]!    

## 2019-11-10 ENCOUNTER — Ambulatory Visit: Payer: Medicare HMO | Admitting: Neurology

## 2019-11-10 ENCOUNTER — Encounter: Payer: Self-pay | Admitting: Neurology

## 2019-11-10 VITALS — BP 130/90 | HR 61 | Ht 61.0 in | Wt 125.0 lb

## 2019-11-10 DIAGNOSIS — F0390 Unspecified dementia without behavioral disturbance: Secondary | ICD-10-CM

## 2019-11-10 NOTE — Progress Notes (Signed)
Reason for visit: Dementia  Melanie Bradley is an 69 y.o. female  History of present illness:  Melanie Bradley is a 69 year old right-handed white female with a history of a progressive dementing illness consistent with Alzheimer's disease.  The patient comes in with her husband, she has been relatively stable with her memory issues since last seen in January 2021.  The patient stays active, she is able to operate a lawnmower and she does yard work on a regular basis.  She will occasionally drive a vehicle half a mile down the road to visit a cemetery, but otherwise she does not drive a car.  The patient does not cook, her husband keeps up with medications, appointments, and does the finances.  The patient sleeps well at night, she is well, she is not losing weight on medication.  She is on Aricept and Namenda and tolerates these drugs well.  She returns for an evaluation.   Past Medical History:  Diagnosis Date  . Atrial fibrillation ([place])   . Bloating   . Complication of anesthesia     " hard to wake up sometimes "  . Dementia ([place])   . Diverticula of colon   . Epigastric burning sensation   . GERD (gastroesophageal reflux disease)   . Headache disorder 06/03/2014  . Headache(784.0)   . Helicobacter pylori gastritis 2004  . Hemorrhoids 06/26/03   Dr. Laural Golden tcs  . Hiatal hernia 06/26/03   Dr. Laural Golden egd  . Hypertension   . Hypothyroidism   . 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 Memory difficulty 06/03/2014  . Shoulder fracture, left   . Spastic colon   . Wrist fracture     Past Surgical History:  Procedure Laterality Date  . APPENDECTOMY    . CHOLECYSTECTOMY    . COLONOSCOPY  06/26/03   Dr. Kem Boroughs diverticula at the sigmoid colon with one of the transverse colon, normal terminal ileoscopy, small external hemorrhoids the  . COLONOSCOPY N/A 01/20/2016   sigmoid diverticulosis, otherwise normal.   . ESOPHAGOGASTRODUODENOSCOPY  03/15/2011   Dr.  Trevor Iha hiatal hernia, abnormal gastric mucosa of unclear significance. Gastric biopsies negative, no H. pylori.Procedure: ESOPHAGOGASTRODUODENOSCOPY (EGD);  Surgeon: Daneil Dolin, MD;  [facility];  Service: Endoscopy;  Laterality: N/A;  7:30  . ORIF HUMERUS FRACTURE Left 05/29/2016   Procedure: LEFT OPEN REDUCTION INTERNAL FIXATION (ORIF) PROXIMAL HUMERUS FRACTURE;  Surgeon: Meredith Pel, MD;  Location: [place];  Service: Orthopedics;  Laterality: Left;  . ORIF WRIST FRACTURE Left 05/29/2016   Procedure: OPEN REDUCTION INTERNAL FIXATION (ORIF) LEFT WRIST FRACTURE;  Surgeon: Meredith Pel, MD;  Location: [place];  Service: Orthopedics;  Laterality: Left;  . RIGHT/LEFT HEART CATH AND CORONARY ANGIOGRAPHY N/A 04/17/2019   Procedure: RIGHT/LEFT HEART CATH AND CORONARY ANGIOGRAPHY;  Surgeon: Belva Crome, MD;  Location: [place] [facility];  Service: Cardiovascular;  Laterality: N/A;  . TUBAL LIGATION    . UPPER GASTROINTESTINAL ENDOSCOPY  06/26/03   Dr. Wynelle Bourgeois sliding hiatal hernia with 8 mm tongue of gastric type mucosa at distal esophagus bile in the stomach.    Family History  Problem Relation Age of Onset  . Hypertension Mother   . Angina Mother   . COPD Father   . Heart failure Father   . Cancer Sister   . Dementia Sister   . Cardiomyopathy Brother   . Pneumonia Other   . Migraines Sister   .  Colon cancer Neg Hx     Social history:  reports that she has been smoking cigarettes. She started smoking about 53 years ago. She has a 45.00 pack-year smoking history. She has never used smokeless tobacco. She reports that she does not drink alcohol and does not use drugs.    Allergies  Allergen Reactions  . Penicillins Hives and Swelling    Has patient had a PCN reaction causing IMMEDIATE RASH, FACIAL/TONGUE/THROAT SWELLING, SOB, OR LIGHTHEADEDNESS WITH HYPOTENSION:  #  #  #  YES  #  #  #  Has patient had a PCN reaction causing severe rash involving mucus  membranes or skin necrosis: No Has patient had a PCN reaction that required hospitalization No Has patient had a PCN reaction occurring within the last 10 years: No If all of the above answers are "NO", then may proceed with Cephalosporin use.   . Benadryl [Diphenhydramine Hcl] Hives  . Msm [Methylsulfonylmethane] Hives  . Nubain [Nalbuphine Hcl] Hives  . Sulfa Antibiotics Hives  . Codeine     UNSPECIFIED REACTION [IF AT ALL] REFUSES TO TAKE    Medications:  Prior to Admission medications   Medication Sig Start Date End Date Taking? Authorizing Provider  acetaminophen (TYLENOL) 500 MG tablet Take 1,000 mg by mouth every 6 (six) hours as needed (headaches.).   Yes [provider]  alendronate (FOSAMAX) 70 MG tablet Take 70 mg by mouth every Tuesday.  07/27/18  Yes [provider]  aspirin EC 81 MG tablet Take 81 mg by mouth daily.   Yes [provider]  Calcium Carb-Cholecalciferol (CALCIUM 600+D3 PO) Take 1 tablet by mouth daily with supper.   Yes [provider]  Cyanocobalamin (B-12 PO) Take 6,000 mcg by mouth daily.   Yes [provider]  donepezil (ARICEPT) 10 MG tablet Take 1 tablet (10 mg total) by mouth at bedtime. 03/11/19  Yes Ward Givens, NP  escitalopram (LEXAPRO) 10 MG tablet Take 10 mg by mouth daily with supper.  10/16/14  Yes [provider]  memantine (NAMENDA) 10 MG tablet Take 1 tablet (10 mg total) by mouth 2 (two) times daily. 03/11/19  Yes Ward Givens, NP  pyridostigmine (MESTINON) 60 MG tablet Take 30 mg by mouth at bedtime.    Yes [provider]  sacubitril-valsartan (ENTRESTO) 24-26 MG Take 1 tablet by mouth 2 (two) times daily. 06/03/19  Yes BranchAlphonse Guild, MD  Specialty Vitamins Products (BRAIN PO) Take 1 tablet by mouth daily. CogniSense Supplement   Yes [provider]  SYNTHROID 88 MCG tablet Take 88 mcg by mouth See admin instructions. Take 1 tablet (88 mcg) by mouth on Sundays,  Mondays, Tuesdays, Wednesdays,Thursdays, & Saturdays at bedtime, DOES NOT TAKE ANY Fridays. 12/15/17  Yes [provider]  carvedilol (COREG) 12.5 MG tablet Take 1 tablet (12.5 mg total) by mouth 2 (two) times daily. 04/10/19 10/14/19  Arnoldo Lenis, MD    ROS:  Out of a complete 14 system review of symptoms, the patient complains only of the following symptoms, and all other reviewed systems are negative.  Memory problems  Blood pressure 130/90, pulse 61, height 5\' 1"  (1.549 m), weight 125 lb (56.7 kg).  Physical Exam  General: The patient is alert and cooperative at the time of the examination.  Skin: No significant peripheral edema is noted.   Neurologic Exam  Mental status: The patient is alert and oriented x 3 at the time of the examination. The Mini-Mental status examination  done today shows a total score of 16/30.   Cranial nerves: Facial symmetry is present. Speech is normal, no aphasia or dysarthria is noted. Extraocular movements are full. Visual fields are full.  Motor: The patient has good strength in all 4 extremities.  Sensory examination: Soft touch sensation is symmetric on the face, arms, and legs.  Coordination: The patient has good finger-nose-finger and heel-to-shin bilaterally, but the patient does have some apraxia with doing heel-to-shin bilaterally.  Gait and station: The patient has a normal gait. Tandem gait is slightly unsteady. Romberg is negative. No drift is seen.  Reflexes: Deep tendon reflexes are symmetric.   Assessment/Plan:  1.  Progressive memory disturbance, dementia  The patient is doing well on Aricept and Namenda, we will continue these medications for now.  The patient will follow up in 9 months.  Jill Alexanders MD 11/10/2019 10:30 AM  [facility] [address] [place], [place] [address]  Phone [phone] Fax [phone]

## 2019-11-13 ENCOUNTER — Other Ambulatory Visit (HOSPITAL_COMMUNITY): Payer: Self-pay | Admitting: Family Medicine

## 2019-11-13 DIAGNOSIS — Z1231 Encounter for screening mammogram for malignant neoplasm of breast: Secondary | ICD-10-CM

## 2019-11-13 DIAGNOSIS — E2839 Other primary ovarian failure: Secondary | ICD-10-CM

## 2019-11-18 ENCOUNTER — Other Ambulatory Visit: Payer: Self-pay

## 2019-11-18 ENCOUNTER — Ambulatory Visit (INDEPENDENT_AMBULATORY_CARE_PROVIDER_SITE_OTHER): Payer: Medicare HMO | Admitting: Gastroenterology

## 2019-11-18 ENCOUNTER — Encounter: Payer: Self-pay | Admitting: Gastroenterology

## 2019-11-18 DIAGNOSIS — Z87898 Personal history of other specified conditions: Secondary | ICD-10-CM | POA: Diagnosis not present

## 2019-11-18 NOTE — Progress Notes (Signed)
Referring Provider: Sharilyn Sites, MD Primary Care Physician:  Sharilyn Sites, MD Primary GI: Dr. Gala Romney   Chief Complaint  Patient presents with  . Irritable Bowel Syndrome    doing fine. no problems    HPI:   Melanie Bradley is a 69 y.o. female presenting today with a history of acute onset of diarrhea shortly after eating at a local restaurant in Sept 2020, requiring hospitalization as she had failed outpatient supportive therapy. Cdiff negative, GI pathogen panel negative. Empirically treated with antibiotic therapy. TSH was normal. Last colonoscopy in 2017 with sigmoid diverticulosis, otherwise normal. 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   She is no longer on Lomotil. No diarrhea. BM once per day. No rectal bleeding. No abdominal pain, N/V. Good appetite for most part. No dysphagia. No GERD.   Past Medical History:  Diagnosis Date  . Atrial fibrillation ([place])   . Bloating   . Complication of anesthesia     " hard to wake up sometimes "  . Dementia ([place])   . Diverticula of colon   . Epigastric burning sensation   . GERD (gastroesophageal reflux disease)   . Headache disorder 06/03/2014  . Headache(784.0)   . Helicobacter pylori gastritis 2004  . Hemorrhoids 06/26/03   Dr. Laural Golden tcs  . Hiatal hernia 06/26/03   Dr. Laural Golden egd  . Hypertension   . Hypothyroidism   . 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 Memory difficulty 06/03/2014  . Shoulder fracture, left   . Spastic colon   . Wrist fracture     Past Surgical History:  Procedure Laterality Date  . APPENDECTOMY    . CHOLECYSTECTOMY    . COLONOSCOPY  06/26/03   Dr. Kem Boroughs diverticula at the sigmoid colon with one of the transverse colon, normal terminal ileoscopy, small external hemorrhoids the  . COLONOSCOPY N/A 01/20/2016   sigmoid diverticulosis, otherwise normal.   . ESOPHAGOGASTRODUODENOSCOPY  03/15/2011   Dr. Trevor Iha  hiatal hernia, abnormal gastric mucosa of unclear significance. Gastric biopsies negative, no H. pylori.Procedure: ESOPHAGOGASTRODUODENOSCOPY (EGD);  Surgeon: Daneil Dolin, MD;  [facility];  Service: Endoscopy;  Laterality: N/A;  7:30  . ORIF HUMERUS FRACTURE Left 05/29/2016   Procedure: LEFT OPEN REDUCTION INTERNAL FIXATION (ORIF) PROXIMAL HUMERUS FRACTURE;  Surgeon: Meredith Pel, MD;  Location: [facility];  Service: Orthopedics;  Laterality: Left;  . ORIF WRIST FRACTURE Left 05/29/2016   Procedure: OPEN REDUCTION INTERNAL FIXATION (ORIF) LEFT WRIST FRACTURE;  Surgeon: Meredith Pel, MD;  Location: [facility];  Service: Orthopedics;  Laterality: Left;  . RIGHT/LEFT HEART CATH AND CORONARY ANGIOGRAPHY N/A 04/17/2019   Procedure: RIGHT/LEFT HEART CATH AND CORONARY ANGIOGRAPHY;  Surgeon: Belva Crome, MD;  Location: [facility];  Service: Cardiovascular;  Laterality: N/A;  . TUBAL LIGATION    . UPPER GASTROINTESTINAL ENDOSCOPY  06/26/03   Dr. Wynelle Bourgeois sliding hiatal hernia with 8 mm tongue of gastric type mucosa at distal esophagus bile in the stomach.    Current Outpatient Medications  Medication Sig Dispense Refill  . acetaminophen (TYLENOL) 500 MG tablet Take 1,000 mg by mouth every 6 (six) hours as needed (headaches.).    Marland Kitchen alendronate (FOSAMAX) 70 MG tablet Take 70 mg by mouth every Tuesday.     Marland Kitchen aspirin EC 81 MG tablet Take 81 mg by mouth daily.    . Calcium Carb-Cholecalciferol (CALCIUM  600+D3 PO) Take 1 tablet by mouth daily with supper.    . carvedilol (COREG) 12.5 MG tablet Take 1 tablet (12.5 mg total) by mouth 2 (two) times daily. 180 tablet 3  . Cyanocobalamin (B-12 PO) Take 6,000 mcg by mouth daily.    Marland Kitchen donepezil (ARICEPT) 10 MG tablet Take 1 tablet (10 mg total) by mouth at bedtime. 90 tablet 3  . escitalopram (LEXAPRO) 10 MG tablet Take 10 mg by mouth daily with supper.   0  . memantine (NAMENDA) 10 MG tablet Take 1 tablet (10 mg total) by mouth 2  (two) times daily. 180 tablet 3  . pyridostigmine (MESTINON) 60 MG tablet Take 30 mg by mouth at bedtime.     . sacubitril-valsartan (ENTRESTO) 24-26 MG Take 1 tablet by mouth 2 (two) times daily. 60 tablet 6  . Specialty Vitamins Products (BRAIN PO) Take 1 tablet by mouth daily. CogniSense Supplement    . SYNTHROID 88 MCG tablet Take 88 mcg by mouth See admin instructions. Take 1 tablet (88 mcg) by mouth on Sundays, Mondays, Tuesdays, Wednesdays,Thursdays, & Saturdays at bedtime, DOES NOT TAKE ANY Fridays.     Current Facility-Administered Medications  Medication Dose Route Frequency Provider Last Rate Last Admin  . sodium chloride flush (NS) 0.9 % injection 3 mL  3 mL Intravenous Q12H Arnoldo Lenis, MD        Allergies as of 11/18/2019 - Review Complete 11/18/2019  Allergen Reaction Noted  . Penicillins Hives and Swelling 07/28/2010  . Benadryl [diphenhydramine hcl] Hives 12/02/2010  . Msm [methylsulfonylmethane] Hives 06/02/2014  . Nubain [nalbuphine hcl] Hives 06/02/2014  . Sulfa antibiotics Hives 07/28/2010  . Codeine  07/28/2010    Family History  Problem Relation Age of Onset  . Hypertension Mother   . Angina Mother   . COPD Father   . Heart failure Father   . Cancer Sister   . Dementia Sister   . Cardiomyopathy Brother   . Pneumonia Other   . Migraines Sister   . Colon cancer Neg Hx     Social History   Socioeconomic History  . Marital status: Married    Spouse name: Not on file  . Number of children: Not on file  . Years of education: 35  . Highest education level: Not on file  Occupational History  . Not on file  Tobacco Use  . Smoking status: Current Every Day Smoker    Packs/day: 1.00    Years: 45.00    Pack years: 45.00    Types: Cigarettes    Start date: 09/10/1966  . Smokeless tobacco: Never Used  . Tobacco comment: 12/21/16 1 PPD  Vaping Use  . Vaping Use: Never used  Substance and Sexual Activity  . Alcohol use: No    Alcohol/week: 0.0  standard drinks  . Drug use: No  . Sexual activity: Yes  Other Topics Concern  . Not on file  Social History Narrative   Patient is right handed.   Patient drinks 2 cups of caffeine daily.   Education [employer]   Social Determinants of Health   Financial Resource Strain:   . Difficulty of Paying Living Expenses: Not on file  Food Insecurity:   . Worried About Charity fundraiser in the Last Year: Not on file  . Ran Out of Food in the Last Year: Not on file  Transportation Needs:   . Lack of Transportation (Medical): Not on file  . Lack of Transportation (Non-Medical): Not  on file  Physical Activity:   . Days of Exercise per Week: Not on file  . Minutes of Exercise per Session: Not on file  Stress:   . Feeling of Stress : Not on file  Social Connections:   . Frequency of Communication with Friends and Family: Not on file  . Frequency of Social Gatherings with Friends and Family: Not on file  . Attends Religious Services: Not on file  . Active Member of Clubs or Organizations: Not on file  . Attends Archivist Meetings: Not on file  . Marital Status: Not on file    Review of Systems: Gen: Denies fever, chills, anorexia. Denies fatigue, weakness, weight loss.  CV: Denies chest pain, palpitations, syncope, peripheral edema, and claudication. Resp: Denies dyspnea at rest, cough, wheezing, coughing up blood, and pleurisy. GI: see HPI Derm: Denies rash, itching, dry skin Psych: Denies depression, anxiety, memory loss, confusion. No homicidal or suicidal ideation.  Heme: Denies bruising, bleeding, and enlarged lymph nodes.  Physical Exam: BP (!) 127/91   Pulse 67   Temp 97.7 F (36.5 C) (Oral)   Ht 5\' 2"  (1.575 m)   Wt 124 lb 6.4 oz (56.4 kg)   BMI 22.75 kg/m  General:   Alert and oriented. No distress noted. Pleasant and cooperative.  Head:  Normocephalic and atraumatic. Eyes:  Conjuctiva clear without scleral icterus. Mouth:  Mask in place Abdomen:  +BS,  soft, non-tender and non-distended. No rebound or guarding. No HSM or masses noted. Msk:  Symmetrical without gross deformities. Normal posture. Extremities:  Without edema. Neurologic:  Alert and  oriented x4 Psych:  Alert and cooperative. Normal mood and affect.  ASSESSMENT/PLAN: Melanie Bradley is a 68 y.o. female presenting today with history of post-infectious IBS last year, doing well since last visit in March 2021. Now with total resolution of diarrhea, no alarm signs/symptoms, and back to her baseline bowel habits.  She is present with her husband today and has no complaints whatsoever. We are not managing any long-term GI medications for her. We will see her back as needed. Next colonoscopy in 2027 or sooner if clinical changes. I discussed signs/symptoms that would necessitate being seen sooner. She and her husband both state understanding.   Annitta Needs, PhD, ANP-BC [facility]

## 2019-11-18 NOTE — Patient Instructions (Signed)
I am so glad you are doing well!  Please call with rectal bleeding, diarrhea, constipation, abdominal pain, lack of appetite, weight loss, nausea/vomiting.  We will see you back as needed, and your next colonoscopy will be in 2027 unless something changes!  I enjoyed seeing you again today! As you know, I value our relationship and want to provide genuine, compassionate, and quality care. I welcome your feedback. If you receive a survey regarding your visit,  I greatly appreciate you taking time to fill this out. See you next time!  Annitta Needs, PhD, ANP-BC [facility] '

## 2019-12-08 ENCOUNTER — Other Ambulatory Visit: Payer: Self-pay

## 2019-12-08 ENCOUNTER — Ambulatory Visit (HOSPITAL_COMMUNITY)
Admission: RE | Admit: 2019-12-08 | Discharge: 2019-12-08 | Disposition: A | Discharge status: Home / Self Care | Payer: Medicare HMO | Source: Ambulatory Visit | Attending: Family Medicine | Admitting: Family Medicine

## 2019-12-08 DIAGNOSIS — Z1231 Encounter for screening mammogram for malignant neoplasm of breast: Secondary | ICD-10-CM | POA: Diagnosis not present

## 2020-01-05 ENCOUNTER — Telehealth: Payer: Self-pay | Admitting: Cardiology

## 2020-01-05 ENCOUNTER — Other Ambulatory Visit: Payer: Self-pay | Admitting: Cardiology

## 2020-01-05 MED ORDER — ENTRESTO 24-26 MG PO TABS: ORAL_TABLET | ORAL | 6 refills | Status: DC

## 2020-01-05 NOTE — Telephone Encounter (Signed)
New message      *STAT* If patient is at the pharmacy, call can be transferred to refill team.   1. Which medications need to be refilled? (please list name of each medication and dose if known) entresto   2. Which pharmacy/location (including street and city if local pharmacy) is medication to be sent to? [facility]    3. Do they need a 30 day or 90 day supply? [place]

## 2020-01-05 NOTE — Telephone Encounter (Signed)
Refill complete 

## 2020-01-07 ENCOUNTER — Emergency Department (HOSPITAL_COMMUNITY): Payer: Medicare HMO

## 2020-01-07 ENCOUNTER — Encounter (HOSPITAL_COMMUNITY): Payer: Self-pay | Admitting: Emergency Medicine

## 2020-01-07 ENCOUNTER — Other Ambulatory Visit: Payer: Self-pay

## 2020-01-07 ENCOUNTER — Observation Stay (HOSPITAL_COMMUNITY)
Admission: EM | Admit: 2020-01-07 | Discharge: 2020-01-08 | Disposition: A | Discharge status: Home / Self Care | Payer: Medicare HMO | Attending: Internal Medicine | Admitting: Internal Medicine

## 2020-01-07 DIAGNOSIS — Z955 Presence of coronary angioplasty implant and graft: Secondary | ICD-10-CM | POA: Diagnosis not present

## 2020-01-07 DIAGNOSIS — E876 Hypokalemia: Secondary | ICD-10-CM | POA: Insufficient documentation

## 2020-01-07 DIAGNOSIS — I447 Left bundle-branch block, unspecified: Secondary | ICD-10-CM | POA: Diagnosis present

## 2020-01-07 DIAGNOSIS — E871 Hypo-osmolality and hyponatremia: Secondary | ICD-10-CM | POA: Diagnosis not present

## 2020-01-07 DIAGNOSIS — I130 Hypertensive heart and chronic kidney disease with heart failure and stage 1 through stage 4 chronic kidney disease, or unspecified chronic kidney disease: Secondary | ICD-10-CM | POA: Insufficient documentation

## 2020-01-07 DIAGNOSIS — Z7982 Long term (current) use of aspirin: Secondary | ICD-10-CM | POA: Insufficient documentation

## 2020-01-07 DIAGNOSIS — I5022 Chronic systolic (congestive) heart failure: Secondary | ICD-10-CM | POA: Insufficient documentation

## 2020-01-07 DIAGNOSIS — Z79899 Other long term (current) drug therapy: Secondary | ICD-10-CM | POA: Insufficient documentation

## 2020-01-07 DIAGNOSIS — F17210 Nicotine dependence, cigarettes, uncomplicated: Secondary | ICD-10-CM | POA: Diagnosis not present

## 2020-01-07 DIAGNOSIS — N179 Acute kidney failure, unspecified: Secondary | ICD-10-CM | POA: Diagnosis not present

## 2020-01-07 DIAGNOSIS — F0390 Unspecified dementia without behavioral disturbance: Secondary | ICD-10-CM | POA: Diagnosis not present

## 2020-01-07 DIAGNOSIS — N1832 Chronic kidney disease, stage 3b: Secondary | ICD-10-CM

## 2020-01-07 DIAGNOSIS — I950 Idiopathic hypotension: Secondary | ICD-10-CM

## 2020-01-07 DIAGNOSIS — U071 COVID-19: Secondary | ICD-10-CM | POA: Diagnosis not present

## 2020-01-07 DIAGNOSIS — I959 Hypotension, unspecified: Secondary | ICD-10-CM | POA: Diagnosis present

## 2020-01-07 DIAGNOSIS — A0839 Other viral enteritis: Secondary | ICD-10-CM

## 2020-01-07 DIAGNOSIS — R197 Diarrhea, unspecified: Secondary | ICD-10-CM | POA: Diagnosis not present

## 2020-01-07 DIAGNOSIS — J449 Chronic obstructive pulmonary disease, unspecified: Secondary | ICD-10-CM | POA: Diagnosis not present

## 2020-01-07 DIAGNOSIS — E039 Hypothyroidism, unspecified: Secondary | ICD-10-CM | POA: Insufficient documentation

## 2020-01-07 DIAGNOSIS — Z720 Tobacco use: Secondary | ICD-10-CM | POA: Diagnosis present

## 2020-01-07 LAB — RESP PANEL BY RT-PCR (FLU A&B, COVID) ARPGX2
Influenza A by PCR: NEGATIVE
Influenza B by PCR: NEGATIVE
SARS Coronavirus 2 by RT PCR: POSITIVE — AB

## 2020-01-07 LAB — COMPREHENSIVE METABOLIC PANEL
ALT: 10 U/L (ref 0–44)
AST: 13 U/L — ABNORMAL LOW (ref 15–41)
Albumin: 3.9 g/dL (ref 3.5–5.0)
Alkaline Phosphatase: 57 U/L (ref 38–126)
Anion gap: 9 (ref 5–15)
BUN: 24 mg/dL — ABNORMAL HIGH (ref 8–23)
CO2: 23 mmol/L (ref 22–32)
Calcium: 9.1 mg/dL (ref 8.9–10.3)
Chloride: 96 mmol/L — ABNORMAL LOW (ref 98–111)
Creatinine, Ser: 2.1 mg/dL — ABNORMAL HIGH (ref 0.44–1.00)
GFR, Estimated: 25 mL/min — ABNORMAL LOW (ref >60)
Glucose, Bld: 119 mg/dL — ABNORMAL HIGH (ref 70–99)
Potassium: 2.6 mmol/L — CL (ref 3.5–5.1)
Sodium: 128 mmol/L — ABNORMAL LOW (ref 135–145)
Total Bilirubin: 0.2 mg/dL — ABNORMAL LOW (ref 0.3–1.2)
Total Protein: 7.4 g/dL (ref 6.5–8.1)

## 2020-01-07 LAB — CBC WITH DIFFERENTIAL/PLATELET
Abs Immature Granulocytes: 0.06 10*3/uL (ref 0.00–0.07)
Basophils Absolute: 0.1 10*3/uL (ref 0.0–0.1)
Basophils Relative: 1 %
Eosinophils Absolute: 0.1 10*3/uL (ref 0.0–0.5)
Eosinophils Relative: 1 %
HCT: 38 % (ref 36.0–46.0)
Hemoglobin: 12.6 g/dL (ref 12.0–15.0)
Immature Granulocytes: 1 %
Lymphocytes Relative: 9 %
Lymphs Abs: 0.7 10*3/uL (ref 0.7–4.0)
MCH: 31.6 pg (ref 26.0–34.0)
MCHC: 33.2 g/dL (ref 30.0–36.0)
MCV: 95.2 fL (ref 80.0–100.0)
Monocytes Absolute: 1 10*3/uL (ref 0.1–1.0)
Monocytes Relative: 13 %
Neutro Abs: 5.7 10*3/uL (ref 1.7–7.7)
Neutrophils Relative %: 75 %
Platelets: 386 10*3/uL (ref 150–400)
RBC: 3.99 MIL/uL (ref 3.87–5.11)
RDW: 13.2 % (ref 11.5–15.5)
WBC: 7.6 10*3/uL (ref 4.0–10.5)
nRBC: 0 % (ref 0.0–0.2)

## 2020-01-07 LAB — LACTIC ACID, PLASMA: Lactic Acid, Venous: 1 mmol/L (ref 0.5–1.9)

## 2020-01-07 LAB — PROTIME-INR
INR: 0.9 (ref 0.8–1.2)
Prothrombin Time: 12.2 seconds (ref 11.4–15.2)

## 2020-01-07 LAB — APTT: aPTT: 31 seconds (ref 24–36)

## 2020-01-07 MED ORDER — LORAZEPAM 2 MG/ML IJ SOLN
1.0000 mg | Freq: Once | INTRAMUSCULAR | Status: AC
  Administered 2020-01-07: 1 mg via INTRAVENOUS
  Filled 2020-01-07: qty 1

## 2020-01-07 MED ORDER — POTASSIUM CHLORIDE 10 MEQ/100ML IV SOLN
10.0000 meq | Freq: Once | INTRAVENOUS | Status: AC
  Administered 2020-01-07: 10 meq via INTRAVENOUS
  Filled 2020-01-07: qty 100

## 2020-01-07 MED ORDER — LEVOFLOXACIN IN D5W 750 MG/150ML IV SOLN
750.0000 mg | Freq: Once | INTRAVENOUS | Status: AC
  Administered 2020-01-07: 750 mg via INTRAVENOUS
  Filled 2020-01-07: qty 150

## 2020-01-07 MED ORDER — LEVOFLOXACIN IN D5W 500 MG/100ML IV SOLN: 500.0000 mg | INTRAVENOUS | Status: DC

## 2020-01-07 MED ORDER — SODIUM CHLORIDE 0.9 % IV BOLUS
2000.0000 mL | Freq: Once | INTRAVENOUS | Status: AC
  Administered 2020-01-07: 2000 mL via INTRAVENOUS

## 2020-01-07 MED ORDER — HYDROXYZINE HCL 50 MG/ML IM SOLN
50.0000 mg | Freq: Once | INTRAMUSCULAR | Status: AC
  Administered 2020-01-07: 50 mg via INTRAMUSCULAR
  Filled 2020-01-07: qty 1

## 2020-01-07 MED ORDER — POTASSIUM CHLORIDE 10 MEQ/100ML IV SOLN
10.0000 meq | INTRAVENOUS | Status: AC
  Administered 2020-01-07 (×3): 10 meq via INTRAVENOUS
  Filled 2020-01-07 (×3): qty 100

## 2020-01-07 MED ORDER — HALOPERIDOL LACTATE 5 MG/ML IJ SOLN
2.0000 mg | Freq: Once | INTRAMUSCULAR | Status: AC
  Administered 2020-01-07: 2 mg via INTRAVENOUS
  Filled 2020-01-07: qty 1

## 2020-01-07 MED ORDER — LACTATED RINGERS IV SOLN: INTRAVENOUS | Status: DC

## 2020-01-07 MED ORDER — SODIUM CHLORIDE 0.9 % IV BOLUS
1000.0000 mL | Freq: Once | INTRAVENOUS | Status: AC
  Administered 2020-01-07: 1000 mL via INTRAVENOUS

## 2020-01-07 MED ORDER — LORAZEPAM 2 MG/ML IJ SOLN
0.5000 mg | Freq: Four times a day (QID) | INTRAMUSCULAR | Status: DC | PRN
  Administered 2020-01-07: 0.5 mg via INTRAVENOUS
  Filled 2020-01-07: qty 1

## 2020-01-07 MED ORDER — POTASSIUM CHLORIDE IN NACL 20-0.9 MEQ/L-% IV SOLN
INTRAVENOUS | Status: AC
  Filled 2020-01-07: qty 1000

## 2020-01-07 MED ORDER — POTASSIUM CHLORIDE CRYS ER 20 MEQ PO TBCR
40.0000 meq | EXTENDED_RELEASE_TABLET | Freq: Once | ORAL | Status: AC
  Administered 2020-01-07: 40 meq via ORAL
  Filled 2020-01-07: qty 2

## 2020-01-07 NOTE — Progress Notes (Signed)
CRITICAL VALUE STICKER  CRITICAL VALUE: POSITIVE COVID-19  RECEIVER (on-site recipient of call): Marigny Borre, [place] NOTIFIED: 01/07/2020 1808  MESSENGER (representative from lab): on site lab tech   MD NOTIFIED: Dr. Carles Collet  TIME OF NOTIFICATION: 8590  RESPONSE: no

## 2020-01-07 NOTE — ED Notes (Signed)
Dr. Roderic Palau made aware of hypotension.

## 2020-01-07 NOTE — Progress Notes (Signed)
Patient is very agaited and aggressive due to husband having to go home. Patient is alert to self and does not orient well. Patient was given 0.5mg  of IV ativan due to agitation. Patient did not respond well to ativan she actually became more aggressive and restless. MD notified and ordered 2mg  of Haldol IV . Haldol given at 2216 with little to no effectiveness, patient kept getting out of bed and demanding to go home with husband. Patient kept pulling at lines and tele box can no longer be kept on patient. Nursing staff has been in patients room several times unable to orient patient or keep in bed. Tele sitter will no longer watch patient due to patient being too active, MD notified. MD ordered Vistaril and patient has been moved to room 341. Patient given Vistaril IM at 2319. At 2345 patient is now beginning to rest.

## 2020-01-07 NOTE — ED Triage Notes (Signed)
Pt c/o diarrhea for the past week with it becoming worse today. Pt has a hx of dementia and states that she does not feel bad.

## 2020-01-07 NOTE — Progress Notes (Signed)
Pharmacy Antibiotic Note  Melanie Bradley is a 69 y.o. female admitted on 01/07/2020 with intra-abdominal infection.  Pharmacy has been consulted for levaquin dosing. Pt with diarrhea for the past week with it becoming worse today. Plan: Levaquin 750mg  IV given in ED, continue with 500mg  IV q48h F/U cxs and clinical progress Monitor V/S, labs   Height: 5\' 1"  (154.9 cm) Weight: 53.1 kg (117 lb) IBW/kg (Calculated) : 47.8  Temp (24hrs), Avg:97.6 F (36.4 C), Min:97.6 F (36.4 C), Max:97.6 F (36.4 C)  Recent Labs  Lab 01/07/20 1144  WBC 7.6  CREATININE 2.10*  LATICACIDVEN 1.0    Estimated Creatinine Clearance: 19.6 mL/min (A) (by C-G formula based on SCr of 2.1 mg/dL (H)).    Allergies  Allergen Reactions  . Penicillins Hives and Swelling    Has patient had a PCN reaction causing IMMEDIATE RASH, FACIAL/TONGUE/THROAT SWELLING, SOB, OR LIGHTHEADEDNESS WITH HYPOTENSION:  #  #  #  YES  #  #  #  Has patient had a PCN reaction causing severe rash involving mucus membranes or skin necrosis: No Has patient had a PCN reaction that required hospitalization No Has patient had a PCN reaction occurring within the last 10 years: No If all of the above answers are "NO", then may proceed with Cephalosporin use.   . Benadryl [Diphenhydramine Hcl] Hives  . Msm [Methylsulfonylmethane] Hives  . Nubain [Nalbuphine Hcl] Hives  . Sulfa Antibiotics Hives  . Codeine     UNSPECIFIED REACTION [IF AT ALL] REFUSES TO TAKE    Antimicrobials this admission: levaquin 11/24>>  Dose adjustments this admission: prn Microbiology results: 11/24  BCx: pending 11/24  UCx: pending 11/24 Cdiff: TBC  Thank you for allowing pharmacy to be a part of this patient's care.  Isac Sarna, BS Pharm D, BCPS Clinical Pharmacist Pager [phone] 01/07/2020 1:21 PM

## 2020-01-07 NOTE — H&P (Signed)
History and Physical  ILO Melanie Bradley:[id number] DOB: 1950-09-03 DOA: 01/07/2020   PCP: Melanie Sites, MD   Patient coming from: Home  Chief Complaint: diarrhea  HPI:  Melanie Bradley is a 69 y.o. female with medical history of sCHF, dementia, COPD, hypertension, IBS, remote atrial flutter presenting with diarrhea of approximately 2 weeks duration.  The patient had been having approximately 6-7 loose bowel movements on a daily basis.  There is no hematochezia, melena, abdominal pain, fevers, chills.  The patient and her spouse eat takeout food or go to a restaurant approximately 6 times per week.  There is been no recent antibiotics or travels.  There is been no sick contacts.  In the past 24 hours, the patient's loose stool has increased to at least 15 bowel movements.  The patient had been giving the patient Lomotil at home for the past 2 weeks.  However, with increase in loose stools the patient was brought to the emergency department for further evaluation.  The patient has not had any fevers, chills, chest pain, shortness breath, cough, hemoptysis, nausea, vomiting, dysuria, hematuria. In the emergency department, the patient was afebrile hemodynamically stable.  She was initially hypotensive with a blood pressure of 59/46.  She was given 3 L of normal saline with improvement of her systolic blood pressure into the 90s.  Oxygen saturation was 99-100% room air.  Sodium was 128, potassium 2.6, chloride 2.10.  LFTs were unremarkable.  CBC was unremarkable.  Chest x-ray was negative.  Lactic acid was 1.0.  EKG shows sinus rhythm with left bundle branch block.  Assessment/Plan: Diarrhea -Etiology unclear -Stool pathogen panel -Stool for C. Difficile -There may be a component of the patient's chronic IBS -Hold off additional antibiotics although the patient was given a dose of Levaquin in the emergency department  Acute on chronic renal failure--CKD stage IIIb -Baseline creatinine  1.1-1.4 -Presented with serum creatinine 2.10 -Secondary to volume depletion -Continue normal saline -Holding Entresto secondary to elevated serum creatinine  Hypokalemia -Replete -Check magnesium  Chronic systolic CHF -7/67/3419 echo EF 35-40%, indeterminate diastolic function, global HK, mild to moderate MR -Clinically euvolemic presently -Holding carvedilol secondary to hypotension -Holding Entresto secondary to hypotension  Hypothyroidism -Continue Synthroid  Dementia without behavioral disturbance -Continue Aricept and Namenda  Tobacco abuse I have discussed tobacco cessation with the patient. I have counseled the patient regarding the negative impacts of continued tobacco use including but not limited to lung cancer, COPD, and cardiovascular disease. I have discussed alternatives to tobacco and modalities that may help facilitate tobacco cessation including but not limited to biofeedback, hypnosis, and medications. Total time spent with tobacco counseling was 4 minutes.       Past Medical History:  Diagnosis Date  . Atrial fibrillation ([place])   . Bloating   . Complication of anesthesia     " hard to wake up sometimes "  . Dementia ([place])   . Diverticula of colon   . Epigastric burning sensation   . GERD (gastroesophageal reflux disease)   . Headache disorder 06/03/2014  . Headache(784.0)   . Helicobacter pylori gastritis 2004  . Hemorrhoids 06/26/03   Dr. Laural Golden [facility]  . Hiatal hernia 06/26/03   Dr. Laural Golden [facility]  . Hypertension   . Hypothyroidism   . 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 Memory difficulty 06/03/2014  . Shoulder fracture, left   . Spastic colon   . Wrist fracture  Past Surgical History:  Procedure Laterality Date  . APPENDECTOMY    . CHOLECYSTECTOMY    . COLONOSCOPY  06/26/03   Dr. Kem Boroughs diverticula at the sigmoid colon with one of the transverse colon, normal terminal ileoscopy, small external hemorrhoids the   . COLONOSCOPY N/A 01/20/2016   sigmoid diverticulosis, otherwise normal.   . ESOPHAGOGASTRODUODENOSCOPY  03/15/2011   Dr. Trevor Iha hiatal hernia, abnormal gastric mucosa of unclear significance. Gastric biopsies negative, no H. pylori.Procedure: ESOPHAGOGASTRODUODENOSCOPY (EGD);  Surgeon: Daneil Dolin, MD;  [facility];  Service: Endoscopy;  Laterality: N/A;  7:30  . ORIF HUMERUS FRACTURE Left 05/29/2016   Procedure: LEFT OPEN REDUCTION INTERNAL FIXATION (ORIF) PROXIMAL HUMERUS FRACTURE;  Surgeon: Meredith Pel, MD;  Location: [facility];  Service: Orthopedics;  Laterality: Left;  . ORIF WRIST FRACTURE Left 05/29/2016   Procedure: OPEN REDUCTION INTERNAL FIXATION (ORIF) LEFT WRIST FRACTURE;  Surgeon: Meredith Pel, MD;  Location: [facility];  Service: Orthopedics;  Laterality: Left;  . RIGHT/LEFT HEART CATH AND CORONARY ANGIOGRAPHY N/A 04/17/2019   Procedure: RIGHT/LEFT HEART CATH AND CORONARY ANGIOGRAPHY;  Surgeon: Belva Crome, MD;  Location: [facility];  Service: Cardiovascular;  Laterality: N/A;  . TUBAL LIGATION    . UPPER GASTROINTESTINAL ENDOSCOPY  06/26/03   Dr. Wynelle Bourgeois sliding hiatal hernia with 8 mm tongue of gastric type mucosa at distal esophagus bile in the stomach.   Social History:  reports that she has been smoking cigarettes. She started smoking about 53 years ago. She has a 45.00 pack-year smoking history. She has never used smokeless tobacco. She reports that she does not drink alcohol and does not use drugs.   Family History  Problem Relation Age of Onset  . Hypertension Mother   . Angina Mother   . COPD Father   . Heart failure Father   . Cancer Sister   . Dementia Sister   . Cardiomyopathy Brother   . Pneumonia Other   . Migraines Sister   . Colon cancer Neg Hx      Allergies  Allergen Reactions  . Penicillins Hives and Swelling    Has patient had a PCN reaction causing IMMEDIATE RASH, FACIAL/TONGUE/THROAT SWELLING, SOB, OR  LIGHTHEADEDNESS WITH HYPOTENSION:  #  #  #  YES  #  #  #  Has patient had a PCN reaction causing severe rash involving mucus membranes or skin necrosis: No Has patient had a PCN reaction that required hospitalization No Has patient had a PCN reaction occurring within the last 10 years: No If all of the above answers are "NO", then may proceed with Cephalosporin use.   . Benadryl [Diphenhydramine Hcl] Hives  . Msm [Methylsulfonylmethane] Hives  . Nubain [Nalbuphine Hcl] Hives  . Sulfa Antibiotics Hives  . Codeine     UNSPECIFIED REACTION [IF AT ALL] REFUSES TO TAKE     Prior to Admission medications   Medication Sig Start Date End Date Taking? Authorizing Provider  acetaminophen (TYLENOL) 500 MG tablet Take 1,000 mg by mouth every 6 (six) hours as needed (headaches.).   Yes [provider]  alendronate (FOSAMAX) 70 MG tablet Take 70 mg by mouth every Tuesday.  07/27/18  Yes [provider]  aspirin EC 81 MG tablet Take 81 mg by mouth daily.   Yes [provider]  Calcium Carb-Cholecalciferol (CALCIUM 600+D3 PO) Take 1 tablet by mouth daily with supper.   Yes [provider]  carvedilol (COREG) 12.5 MG tablet Take 1 tablet (  12.5 mg total) by mouth 2 (two) times daily. 04/10/19 01/07/20 Yes BranchAlphonse Guild, MD  Cyanocobalamin (B-12 PO) Take 6,000 mcg by mouth daily.   Yes [provider]  donepezil (ARICEPT) 10 MG tablet Take 1 tablet (10 mg total) by mouth at bedtime. 03/11/19  Yes Ward Givens, NP  escitalopram (LEXAPRO) 10 MG tablet Take 10 mg by mouth daily with supper.  10/16/14  Yes [provider]  memantine (NAMENDA) 10 MG tablet Take 1 tablet (10 mg total) by mouth 2 (two) times daily. 03/11/19  Yes Ward Givens, NP  pyridostigmine (MESTINON) 60 MG tablet Take 30 mg by mouth at bedtime.    Yes [provider]  sacubitril-valsartan (ENTRESTO) 24-26 MG TAKE (1) TABLET BY MOUTH TWICE DAILY. 01/05/20  Yes BranchAlphonse Guild, MD  Specialty Vitamins Products (BRAIN PO) Take 1 tablet by mouth daily. [employer] Supplement   Yes [provider]  SYNTHROID 88 MCG tablet Take 88 mcg by mouth See admin instructions. Take 1 tablet (88 mcg) by mouth on Sundays, Mondays, Tuesdays, Wednesdays,Thursdays, & Saturdays at bedtime, DOES NOT TAKE ANY Fridays. 12/15/17  Yes [provider]    Review of Systems:  Constitutional:  No weight loss, night sweats, Fevers, chills, fatigue.  Head&Eyes: No headache.  No vision loss.  No eye pain or scotoma ENT:  No Difficulty swallowing,Tooth/dental problems,Sore throat,  No ear ache, post nasal drip,  Cardio-vascular:  No chest pain, Orthopnea, PND, swelling in lower extremities,  dizziness, palpitations  GI:  No  abdominal pain, nausea, vomiting, hematochezia, melena, heartburn, indigestion, Resp:  No shortness of breath with exertion or at rest. No cough. No coughing up of blood .No wheezing.No chest wall deformity  Skin:  no rash or lesions.  GU:  no dysuria, change in color of urine, no urgency or frequency. No flank pain.  Musculoskeletal:  No joint pain or swelling. No decreased range of motion. No back pain.  Psych:  No change in mood or affect. No depression or anxiety. Neurologic: No headache, no dysesthesia, no focal weakness, no vision loss. No syncope  Physical Exam: Vitals:   01/07/20 1217 01/07/20 1230 01/07/20 1300 01/07/20 1330  BP:  (!) 103/58 91/72 94/71   Pulse: (!) 58 (!) 57 60 72  Resp: 19 20 17 16   Temp:      TempSrc:      SpO2: 98% 100% 97% 95%  Weight:      Height:       General:  A&O x 2, NAD, nontoxic, pleasant/cooperative Head/Eye: No conjunctival hemorrhage, no icterus, [place]/AT, No nystagmus ENT:  No icterus,  No thrush, good dentition, no pharyngeal exudate Neck:  No masses, no lymphadenpathy, no bruits CV:  RRR, no rub, no gallop, no S3 Lung:  CTAB, good air movement, no wheeze, no rhonchi Abdomen: soft/NT, +BS,  nondistended, no peritoneal signs Ext: No cyanosis, No rashes, No petechiae, No lymphangitis, No edema Neuro: CNII-XII intact, strength 4/5 in bilateral upper and lower extremities, no dysmetria  Labs on Admission:  Basic Metabolic Panel: Recent Labs  Lab 01/07/20 1144  NA 128*  K 2.6*  CL 96*  CO2 23  GLUCOSE 119*  BUN 24*  CREATININE 2.10*  CALCIUM 9.1   Liver Function Tests: Recent Labs  Lab 01/07/20 1144  AST 13*  ALT 10  ALKPHOS 57  BILITOT 0.2*  PROT 7.4  ALBUMIN 3.9   No results for input(s): LIPASE, AMYLASE in the last 168 hours. No results for input(s):  AMMONIA in the last 168 hours. CBC: Recent Labs  Lab 01/07/20 1144  WBC 7.6  NEUTROABS 5.7  HGB 12.6  HCT 38.0  MCV 95.2  PLT 386   Coagulation Profile: Recent Labs  Lab 01/07/20 1144  INR 0.9   Cardiac Enzymes: No results for input(s): CKTOTAL, CKMB, CKMBINDEX, TROPONINI in the last 168 hours. BNP: Invalid input(s): POCBNP CBG: No results for input(s): GLUCAP in the last 168 hours. Urine analysis:    Component Value Date/Time   COLORURINE YELLOW 10/26/2018 0920   APPEARANCEUR HAZY (A) 10/26/2018 0920   LABSPEC 1.013 10/26/2018 0920   PHURINE 5.0 10/26/2018 0920   GLUCOSEU NEGATIVE 10/26/2018 0920   HGBUR NEGATIVE 10/26/2018 0920   BILIRUBINUR NEGATIVE 10/26/2018 0920   KETONESUR NEGATIVE 10/26/2018 0920   PROTEINUR NEGATIVE 10/26/2018 0920   NITRITE NEGATIVE 10/26/2018 0920   LEUKOCYTESUR NEGATIVE 10/26/2018 0920   Sepsis Labs: @LABRCNTIP (procalcitonin:4,lacticidven:4) ) Recent Results (from the past 240 hour(s))  Blood Culture (routine x 2)     Status: None (Preliminary result)   Collection Time: 01/07/20 11:44 AM   Specimen: BLOOD  Result Value Ref Range Status   Specimen Description BLOOD LEFT ANTECUBITAL  Final   Special Requests   Final    BOTTLES DRAWN AEROBIC AND ANAEROBIC Blood Culture adequate volume Performed at [facility], [address] [place], [place] [address]     Culture PENDING  Incomplete   Report Status PENDING  Incomplete  Blood Culture (routine x 2)     Status: None (Preliminary result)   Collection Time: 01/07/20 12:20 PM   Specimen: BLOOD  Result Value Ref Range Status   Specimen Description BLOOD RIGHT ANTECUBITAL  Final   Special Requests   Final    BOTTLES DRAWN AEROBIC AND ANAEROBIC Blood Culture adequate volume Performed at [facility], [address] [place], [place] [address]    Culture PENDING  Incomplete   Report Status PENDING  Incomplete     Radiological Exams on Admission: DG Chest Port 1 View  Result Date: 01/07/2020 CLINICAL DATA:  Weakness, diarrhea EXAM: PORTABLE CHEST 1 VIEW COMPARISON:  02/21/2018 FINDINGS: The heart size and mediastinal contours are stable. No focal airspace consolidation, pleural effusion, or pneumothorax. Partially visualized proximal left humeral ORIF hardware. IMPRESSION: No acute cardiopulmonary findings. Electronically Signed   By: Davina Poke D.O.   On: 01/07/2020 12:08    EKG: Independently reviewed. Sinus, LBBB    Time spent:60 minutes Code Status:   FULL Family Communication:  spouse at bedside Disposition Plan: expect 2-3 day hospitalization Consults called: none  DVT Prophylaxis: [place] Heparin     Orson Eva, DO  [facility] Pager [phone]  If 7PM-7AM, please contact night-coverage [email or web address] Password [facility] 01/07/2020, 2:53 PM

## 2020-01-07 NOTE — ED Provider Notes (Signed)
[facility] EMERGENCY DEPARTMENT Provider Note   CSN: [id number] Arrival date & time: 01/07/20  1116     History Chief Complaint  Patient presents with  . Diarrhea with hypotension    Melanie Bradley is a 69 y.o. female.  Patient has been having diarrhea for a week and has gotten worse last couple days she has felt weak.  She has dementia and her husband takes care of her  The history is provided by the patient and the spouse. No language interpreter was used.  Diarrhea Quality:  Mucous Severity:  Moderate Onset quality:  Sudden Timing:  Constant Progression:  Worsening Relieved by:  Nothing Worsened by:  Nothing Ineffective treatments:  None tried Associated symptoms: no abdominal pain and no headaches   Risk factors: no recent antibiotic use        Past Medical History:  Diagnosis Date  . Atrial fibrillation ([place])   . Bloating   . Complication of anesthesia     " hard to wake up sometimes "  . Dementia ([place])   . Diverticula of colon   . Epigastric burning sensation   . GERD (gastroesophageal reflux disease)   . Headache disorder 06/03/2014  . Headache(784.0)   . Helicobacter pylori gastritis 2004  . Hemorrhoids 06/26/03   Dr. Laural Golden tcs  . Hiatal hernia 06/26/03   Dr. Laural Golden egd  . Hypertension   . Hypothyroidism   . 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 Memory difficulty 06/03/2014  . Shoulder fracture, left   . Spastic colon   . Wrist fracture     Patient Active Problem List   Diagnosis Date Noted  . History of diarrhea 11/18/2019  . Chronic systolic heart failure ([place])   . Cardiomyopathy ([place]) 10/28/2018  . AKI (acute kidney injury) ([place])   . Dehydration   . Hypokalemia   . Syncope and collapse   . Syncope 10/26/2018  . C. difficile enteritis 02/22/2018  . Sepsis ([place]) 02/21/2018  . Leukocytosis 02/21/2018  . Vomiting and diarrhea 02/21/2018  . Dementia without behavioral disturbance ([place]) 02/21/2018  . Hypothyroidism  02/21/2018  . Diarrhea 02/21/2018  . Wrist fracture 06/26/2016  . Humeral fracture 06/26/2016  . Encounter for screening colonoscopy 12/31/2015  . Memory difficulty 06/03/2014  . Headache disorder 06/03/2014  . Atrial flutter ([place]) 05/09/2013  . COPD (chronic obstructive pulmonary disease) ([place]) 05/09/2013  . Tobacco abuse 05/08/2013  . LBBB (left bundle branch block)- intermittent 05/08/2013  . Chronic diarrhea 05/11/2011  . Chronic nausea 05/11/2011  . Dyspepsia 02/24/2011  . IBS (irritable bowel syndrome) 02/24/2011  . Chest pain 12/05/2010  . Hypertension 12/05/2010    Past Surgical History:  Procedure Laterality Date  . APPENDECTOMY    . CHOLECYSTECTOMY    . COLONOSCOPY  06/26/03   Dr. Kem Boroughs diverticula at the sigmoid colon with one of the transverse colon, normal terminal ileoscopy, small external hemorrhoids the  . COLONOSCOPY N/A 01/20/2016   sigmoid diverticulosis, otherwise normal.   . ESOPHAGOGASTRODUODENOSCOPY  03/15/2011   Dr. Trevor Iha hiatal hernia, abnormal gastric mucosa of unclear significance. Gastric biopsies negative, no H. pylori.Procedure: ESOPHAGOGASTRODUODENOSCOPY (EGD);  Surgeon: Daneil Dolin, MD;  [facility];  Service: Endoscopy;  Laterality: N/A;  7:30  . ORIF HUMERUS FRACTURE Left 05/29/2016   Procedure: LEFT OPEN REDUCTION INTERNAL FIXATION (ORIF) PROXIMAL HUMERUS FRACTURE;  Surgeon: Meredith Pel, MD;  Location: [place];  Service: Orthopedics;  Laterality: Left;  . ORIF WRIST  FRACTURE Left 05/29/2016   Procedure: OPEN REDUCTION INTERNAL FIXATION (ORIF) LEFT WRIST FRACTURE;  Surgeon: Meredith Pel, MD;  Location: [place];  Service: Orthopedics;  Laterality: Left;  . RIGHT/LEFT HEART CATH AND CORONARY ANGIOGRAPHY N/A 04/17/2019   Procedure: RIGHT/LEFT HEART CATH AND CORONARY ANGIOGRAPHY;  Surgeon: Belva Crome, MD;  Location: [place] [facility];  Service: Cardiovascular;  Laterality: N/A;  . TUBAL LIGATION    . UPPER  GASTROINTESTINAL ENDOSCOPY  06/26/03   Dr. Wynelle Bourgeois sliding hiatal hernia with 8 mm tongue of gastric type mucosa at distal esophagus bile in the stomach.     OB History   No obstetric history on file.     Family History  Problem Relation Age of Onset  . Hypertension Mother   . Angina Mother   . COPD Father   . Heart failure Father   . Cancer Sister   . Dementia Sister   . Cardiomyopathy Brother   . Pneumonia Other   . Migraines Sister   . Colon cancer Neg Hx     Social History   Tobacco Use  . Smoking status: Current Every Day Smoker    Packs/day: 1.00    Years: 45.00    Pack years: 45.00    Types: Cigarettes    Start date: 09/10/1966  . Smokeless tobacco: Never Used  . Tobacco comment: 12/21/16 1 PPD  Vaping Use  . Vaping Use: Never used  Substance Use Topics  . Alcohol use: No    Alcohol/week: 0.0 standard drinks  . Drug use: No    Home Medications Prior to Admission medications   Medication Sig Start Date End Date Taking? Authorizing Provider  acetaminophen (TYLENOL) 500 MG tablet Take 1,000 mg by mouth every 6 (six) hours as needed (headaches.).   Yes [provider]  alendronate (FOSAMAX) 70 MG tablet Take 70 mg by mouth every Tuesday.  07/27/18  Yes [provider]  aspirin EC 81 MG tablet Take 81 mg by mouth daily.   Yes [provider]  Calcium Carb-Cholecalciferol (CALCIUM 600+D3 PO) Take 1 tablet by mouth daily with supper.   Yes [provider]  carvedilol (COREG) 12.5 MG tablet Take 1 tablet (12.5 mg total) by mouth 2 (two) times daily. 04/10/19 01/07/20 Yes BranchAlphonse Guild, MD  Cyanocobalamin (B-12 PO) Take 6,000 mcg by mouth daily.   Yes [provider]  donepezil (ARICEPT) 10 MG tablet Take 1 tablet (10 mg total) by mouth at bedtime. 03/11/19  Yes Ward Givens, NP  escitalopram (LEXAPRO) 10 MG tablet Take 10 mg by mouth daily with supper.  10/16/14  Yes [provider]  memantine (NAMENDA)  10 MG tablet Take 1 tablet (10 mg total) by mouth 2 (two) times daily. 03/11/19  Yes Ward Givens, NP  pyridostigmine (MESTINON) 60 MG tablet Take 30 mg by mouth at bedtime.    Yes [provider]  sacubitril-valsartan (ENTRESTO) 24-26 MG TAKE (1) TABLET BY MOUTH TWICE DAILY. 01/05/20  Yes BranchAlphonse Guild, MD  Specialty Vitamins Products (BRAIN PO) Take 1 tablet by mouth daily. CogniSense Supplement   Yes [provider]  SYNTHROID 88 MCG tablet Take 88 mcg by mouth See admin instructions. Take 1 tablet (88 mcg) by mouth on Sundays, Mondays, Tuesdays, Wednesdays,Thursdays, & Saturdays at bedtime, DOES NOT TAKE ANY Fridays. 12/15/17  Yes [provider]    Allergies    Penicillins, Benadryl [diphenhydramine hcl], Msm [methylsulfonylmethane], Nubain [nalbuphine hcl], Sulfa antibiotics, and Codeine  Review  of Systems   Review of Systems  Constitutional: Negative for appetite change and fatigue.  HENT: Negative for congestion, ear discharge and sinus pressure.   Eyes: Negative for discharge.  Respiratory: Negative for cough.   Cardiovascular: Negative for chest pain.  Gastrointestinal: Positive for diarrhea. Negative for abdominal pain.  Genitourinary: Negative for frequency and hematuria.  Musculoskeletal: Negative for back pain.  Skin: Negative for rash.  Neurological: Negative for seizures and headaches.  Psychiatric/Behavioral: Negative for hallucinations.    Physical Exam Updated Vital Signs BP 94/71   Pulse 72   Temp 97.6 F (36.4 C) (Oral)   Resp 16   Ht 5\' 1"  (1.549 m)   Wt 53.1 kg   SpO2 95%   BMI 22.11 kg/m   Physical Exam Vitals and nursing note reviewed.  Constitutional:      Appearance: She is well-developed.  HENT:     Head: Normocephalic.     Nose: Nose normal.  Eyes:     General: No scleral icterus.    Conjunctiva/sclera: Conjunctivae normal.  Neck:     Thyroid: No thyromegaly.  Cardiovascular:     Rate and Rhythm: Normal  rate and regular rhythm.     Heart sounds: No murmur heard.  No friction rub. No gallop.   Pulmonary:     Breath sounds: No stridor. No wheezing or rales.  Chest:     Chest wall: No tenderness.  Abdominal:     General: There is no distension.     Tenderness: There is no abdominal tenderness. There is no rebound.  Musculoskeletal:        General: Normal range of motion.     Cervical back: Neck supple.  Lymphadenopathy:     Cervical: No cervical adenopathy.  Skin:    Findings: No erythema or rash.  Neurological:     Mental Status: She is alert and oriented to person, place, and time.     Motor: No abnormal muscle tone.     Coordination: Coordination normal.  Psychiatric:        Behavior: Behavior normal.     ED Results / Procedures / Treatments   Labs (all labs ordered are listed, but only abnormal results are displayed) Labs Reviewed  COMPREHENSIVE METABOLIC PANEL - Abnormal; Notable for the following components:      Result Value   Sodium 128 (*)    Potassium 2.6 (*)    Chloride 96 (*)    Glucose, Bld 119 (*)    BUN 24 (*)    Creatinine, Ser 2.10 (*)    AST 13 (*)    Total Bilirubin 0.2 (*)    GFR, Estimated 25 (*)    All other components within normal limits  CULTURE, BLOOD (ROUTINE X 2)  CULTURE, BLOOD (ROUTINE X 2)  URINE CULTURE  GASTROINTESTINAL PANEL BY PCR, STOOL (REPLACES STOOL CULTURE)  C DIFFICILE (CDIFF) QUICK SCRN (NO PCR REFLEX)  LACTIC ACID, PLASMA  CBC WITH DIFFERENTIAL/PLATELET  PROTIME-INR  APTT  URINALYSIS, ROUTINE W REFLEX MICROSCOPIC    EKG None  Radiology DG Chest Port 1 View  Result Date: 01/07/2020 CLINICAL DATA:  Weakness, diarrhea EXAM: PORTABLE CHEST 1 VIEW COMPARISON:  02/21/2018 FINDINGS: The heart size and mediastinal contours are stable. No focal airspace consolidation, pleural effusion, or pneumothorax. Partially visualized proximal left humeral ORIF hardware. IMPRESSION: No acute cardiopulmonary findings. Electronically  Signed   By: Davina Poke D.O.   On: 01/07/2020 12:08    Procedures Procedures (including critical care time)  Medications Ordered in ED Medications  levofloxacin (LEVAQUIN) IVPB 500 mg (has no administration in time range)  potassium chloride 10 mEq in 100 mL IVPB (10 mEq Intravenous New Bag/Given 01/07/20 1352)  levofloxacin (LEVAQUIN) IVPB 750 mg (0 mg Intravenous Stopped 01/07/20 1407)  sodium chloride 0.9 % bolus 2,000 mL (0 mLs Intravenous Stopped 01/07/20 1407)  potassium chloride SA (KLOR-CON) CR tablet 40 mEq (40 mEq Oral Given 01/07/20 1345)  sodium chloride 0.9 % bolus 1,000 mL (1,000 mLs Intravenous New Bag/Given 01/07/20 1410)    ED Course  I have reviewed the triage vital signs and the nursing notes.  Pertinent labs & imaging results that were available during my care of the patient were reviewed by me and considered in my medical decision making (see chart for details). CRITICAL CARE Performed by: Milton Ferguson Total critical care time: 40 minutes Critical care time was exclusive of separately billable procedures and treating other patients. Critical care was necessary to treat or prevent imminent or life-threatening deterioration. Critical care was time spent personally by me on the following activities: development of treatment plan with patient and/or surrogate as well as nursing, discussions with consultants, evaluation of patient's response to treatment, examination of patient, obtaining history from patient or surrogate, ordering and performing treatments and interventions, ordering and review of laboratory studies, ordering and review of radiographic studies, pulse oximetry and re-evaluation of patient's condition.    MDM Rules/Calculators/A&P                          Patient with AKI and hypokalemia.  She will be admitted to medicine for hydration and replacement of potassium.  Patient is also positive Covid COVID-19 Final Clinical Impression(s) / ED  Diagnoses Final diagnoses:  AKI (acute kidney injury) ([place])  Hypokalemia    Rx / DC Orders ED Discharge Orders    None       Milton Ferguson, MD 01/13/20 [phone]

## 2020-01-07 NOTE — Plan of Care (Signed)
  Problem: Education: Goal: Knowledge of risk factors and measures for prevention of condition will improve Outcome: Progressing   Problem: Coping: Goal: Psychosocial and spiritual needs will be supported Outcome: Progressing   Problem: Respiratory: Goal: Will maintain a patent airway Outcome: Progressing Goal: Complications related to the disease process, condition or treatment will be avoided or minimized Outcome: Progressing   

## 2020-01-08 DIAGNOSIS — E876 Hypokalemia: Secondary | ICD-10-CM | POA: Diagnosis not present

## 2020-01-08 DIAGNOSIS — U071 COVID-19: Secondary | ICD-10-CM

## 2020-01-08 DIAGNOSIS — A0839 Other viral enteritis: Secondary | ICD-10-CM

## 2020-01-08 DIAGNOSIS — R197 Diarrhea, unspecified: Secondary | ICD-10-CM | POA: Diagnosis not present

## 2020-01-08 DIAGNOSIS — I950 Idiopathic hypotension: Secondary | ICD-10-CM | POA: Diagnosis not present

## 2020-01-08 DIAGNOSIS — N179 Acute kidney failure, unspecified: Secondary | ICD-10-CM | POA: Diagnosis not present

## 2020-01-08 LAB — CBC
HCT: 36.9 % (ref 36.0–46.0)
Hemoglobin: 12 g/dL (ref 12.0–15.0)
MCH: 31.8 pg (ref 26.0–34.0)
MCHC: 32.5 g/dL (ref 30.0–36.0)
MCV: 97.9 fL (ref 80.0–100.0)
Platelets: 319 10*3/uL (ref 150–400)
RBC: 3.77 MIL/uL — ABNORMAL LOW (ref 3.87–5.11)
RDW: 13.3 % (ref 11.5–15.5)
WBC: 7.7 10*3/uL (ref 4.0–10.5)
nRBC: 0 % (ref 0.0–0.2)

## 2020-01-08 LAB — COMPREHENSIVE METABOLIC PANEL
ALT: 10 U/L (ref 0–44)
AST: 17 U/L (ref 15–41)
Albumin: 2.9 g/dL — ABNORMAL LOW (ref 3.5–5.0)
Alkaline Phosphatase: 49 U/L (ref 38–126)
Anion gap: 5 (ref 5–15)
BUN: 16 mg/dL (ref 8–23)
CO2: 18 mmol/L — ABNORMAL LOW (ref 22–32)
Calcium: 7.6 mg/dL — ABNORMAL LOW (ref 8.9–10.3)
Chloride: 110 mmol/L (ref 98–111)
Creatinine, Ser: 1.47 mg/dL — ABNORMAL HIGH (ref 0.44–1.00)
GFR, Estimated: 39 mL/min — ABNORMAL LOW (ref >60)
Glucose, Bld: 92 mg/dL (ref 70–99)
Potassium: 3.4 mmol/L — ABNORMAL LOW (ref 3.5–5.1)
Sodium: 133 mmol/L — ABNORMAL LOW (ref 135–145)
Total Bilirubin: 0.2 mg/dL — ABNORMAL LOW (ref 0.3–1.2)
Total Protein: 5.7 g/dL — ABNORMAL LOW (ref 6.5–8.1)

## 2020-01-08 LAB — MAGNESIUM: Magnesium: 1.4 mg/dL — ABNORMAL LOW (ref 1.7–2.4)

## 2020-01-08 LAB — D-DIMER, QUANTITATIVE: D-Dimer, Quant: 0.4 ug/mL-FEU (ref 0.00–0.50)

## 2020-01-08 LAB — FERRITIN: Ferritin: 52 ng/mL (ref 11–307)

## 2020-01-08 LAB — HIV ANTIBODY (ROUTINE TESTING W REFLEX): HIV Screen 4th Generation wRfx: NONREACTIVE

## 2020-01-08 LAB — C-REACTIVE PROTEIN: CRP: <0.5 mg/dL (ref <1.0)

## 2020-01-08 MED ORDER — MEMANTINE HCL 10 MG PO TABS
10.0000 mg | ORAL_TABLET | Freq: Two times a day (BID) | ORAL | Status: DC
  Administered 2020-01-08: 10 mg via ORAL
  Filled 2020-01-08: qty 1

## 2020-01-08 MED ORDER — LEVOTHYROXINE SODIUM 88 MCG PO TABS: 88.0000 ug | ORAL_TABLET | ORAL | Status: DC

## 2020-01-08 MED ORDER — ONDANSETRON HCL 4 MG PO TABS: 4.0000 mg | ORAL_TABLET | Freq: Four times a day (QID) | ORAL | Status: DC | PRN

## 2020-01-08 MED ORDER — PYRIDOSTIGMINE BROMIDE 60 MG PO TABS: 30.0000 mg | ORAL_TABLET | Freq: Every day | ORAL | Status: DC

## 2020-01-08 MED ORDER — DONEPEZIL HCL 5 MG PO TABS: 10.0000 mg | ORAL_TABLET | Freq: Every day | ORAL | Status: DC

## 2020-01-08 MED ORDER — ASPIRIN EC 81 MG PO TBEC
81.0000 mg | DELAYED_RELEASE_TABLET | Freq: Every day | ORAL | Status: DC
  Administered 2020-01-08: 81 mg via ORAL
  Filled 2020-01-08: qty 1

## 2020-01-08 MED ORDER — POTASSIUM CHLORIDE CRYS ER 20 MEQ PO TBCR
20.0000 meq | EXTENDED_RELEASE_TABLET | Freq: Once | ORAL | Status: AC
  Administered 2020-01-08: 20 meq via ORAL
  Filled 2020-01-08: qty 1

## 2020-01-08 MED ORDER — ONDANSETRON HCL 4 MG/2ML IJ SOLN: 4.0000 mg | Freq: Four times a day (QID) | INTRAMUSCULAR | Status: DC | PRN

## 2020-01-08 MED ORDER — MAGNESIUM SULFATE 2 GM/50ML IV SOLN
2.0000 g | Freq: Once | INTRAVENOUS | Status: AC
  Administered 2020-01-08: 2 g via INTRAVENOUS
  Filled 2020-01-08: qty 50

## 2020-01-08 MED ORDER — ESCITALOPRAM OXALATE 10 MG PO TABS
10.0000 mg | ORAL_TABLET | Freq: Every day | ORAL | Status: DC
  Administered 2020-01-08: 10 mg via ORAL
  Filled 2020-01-08: qty 1

## 2020-01-08 MED ORDER — ACETAMINOPHEN 325 MG PO TABS: 650.0000 mg | ORAL_TABLET | Freq: Four times a day (QID) | ORAL | Status: DC | PRN

## 2020-01-08 MED ORDER — ACETAMINOPHEN 650 MG RE SUPP: 650.0000 mg | Freq: Four times a day (QID) | RECTAL | Status: DC | PRN

## 2020-01-08 MED ORDER — HEPARIN SODIUM (PORCINE) 5000 UNIT/ML IJ SOLN
5000.0000 [IU] | Freq: Three times a day (TID) | INTRAMUSCULAR | Status: DC
  Administered 2020-01-08 (×2): 5000 [IU] via SUBCUTANEOUS
  Filled 2020-01-08 (×2): qty 1

## 2020-01-08 NOTE — Care Management Obs Status (Signed)
[facility] NOTIFICATION   Patient Details  Name: Melanie Bradley MRN: [id number] Date of Birth: 06/02/1950   Medicare Observation Status Notification Given:  Yes    Boneta Lucks, RN 01/08/2020, 12:41 PM

## 2020-01-08 NOTE — Progress Notes (Signed)
Patient very active, confused, high fall risk and no redirectable.  Teles[facility] called earlier in shift and discontinued monitoring, as they stated patient was not appropriate for their services.  Patient has order for 1:1 sitter.

## 2020-01-08 NOTE — Discharge Summary (Signed)
Physician Discharge Summary  Melanie Bradley KDT:[id number] DOB: 04/10/1950 DOA: 01/07/2020  PCP: Sharilyn Sites, MD  Admit date: 01/07/2020 Discharge date: 01/08/2020  Admitted From: Home Disposition:  Home   Recommendations for Outpatient Follow-up:  1. Follow up with PCP in 1-2 weeks 2. Please obtain BMP/CBC in one week    Discharge Condition: Stable CODE STATUS: FULL Diet recommendation: Heart Healthy   Brief/Interim Summary:   Discharge Diagnoses:  Diarrhea/COVID-19 Enteritis -Stool pathogen panel--not collected due to very small amounts of stool mixed with urine during hospitalization -Stool for C. Difficile--not collected due to very small amounts of stool mixed with urine during hospitalization -There may be a component of the patient's chronic IBS -Hold off additional antibiotics although the patient was given a dose of Levaquin in the emergency department -overall only 4 BMs in past 24 hours since admission with very small volume  -prn loperamide  Acute on chronic renal failure--CKD stage IIIb -Baseline creatinine 1.1-1.4 -Presented with serum creatinine 2.10 -Secondary to volume depletion -Continue normal saline-->improved -serum creatinine 1.47 on day of d/c -Holding Entresto secondary to elevated serum creatinine -instructed spouse to restart Entresto on 11/26 and follow up next week with PCP for labs/BMP  Hypokalemia/Hypomagnesemia -Repleted  Hyponatremia -due to volume depletion -improved with IVF  Chronic systolic CHF -9/98/3382 echo EF 35-40%, indeterminate diastolic function, global HK, mild to moderate MR -Clinically euvolemic presently -Holding carvedilol secondary to hypotension -Holding Entresto secondary to hypotension  Hypothyroidism -Continue Synthroid  Dementia without behavioral disturbance -Continue Aricept and Namenda  Tobacco abuse I have discussed tobacco cessation with the patient. I have counseled the patient  regarding the negative impacts of continued tobacco use including but not limited to lung cancer, COPD, and cardiovascular disease. I have discussed alternatives to tobacco and modalities that may help facilitate tobacco cessation including but not limited to biofeedback, hypnosis, and medications. Total time spent with tobacco counseling was 4 minutes.   Discharge Instructions   Allergies as of 01/08/2020      Reactions   Penicillins Hives, Swelling   Has patient had a PCN reaction causing IMMEDIATE RASH, FACIAL/TONGUE/THROAT SWELLING, SOB, OR LIGHTHEADEDNESS WITH HYPOTENSION:  #  #  #  YES  #  #  #  Has patient had a PCN reaction causing severe rash involving mucus membranes or skin necrosis: No Has patient had a PCN reaction that required hospitalization No Has patient had a PCN reaction occurring within the last 10 years: No If all of the above answers are "NO", then may proceed with Cephalosporin use.   Benadryl [diphenhydramine Hcl] Hives   Msm [methylsulfonylmethane] Hives   Nubain [nalbuphine Hcl] Hives   Sulfa Antibiotics Hives   Codeine    UNSPECIFIED REACTION [IF AT ALL] REFUSES TO TAKE      Medication List    TAKE these medications   acetaminophen 500 MG tablet Commonly known as: TYLENOL Take 1,000 mg by mouth every 6 (six) hours as needed (headaches.).   alendronate 70 MG tablet Commonly known as: FOSAMAX Take 70 mg by mouth every Tuesday.   aspirin EC 81 MG tablet Take 81 mg by mouth daily.   B-12 PO Take 6,000 mcg by mouth daily.   BRAIN PO Take 1 tablet by mouth daily. CogniSense Supplement   CALCIUM 600+D3 PO Take 1 tablet by mouth daily with supper.   carvedilol 12.5 MG tablet Commonly known as: COREG Take 1 tablet (12.5 mg total) by mouth 2 (two) times daily.   donepezil 10 MG  tablet Commonly known as: ARICEPT Take 1 tablet (10 mg total) by mouth at bedtime.   Entresto 24-26 MG Generic drug: sacubitril-valsartan TAKE (1) TABLET BY MOUTH  TWICE DAILY.   escitalopram 10 MG tablet Commonly known as: LEXAPRO Take 10 mg by mouth daily with supper.   memantine 10 MG tablet Commonly known as: NAMENDA Take 1 tablet (10 mg total) by mouth 2 (two) times daily.   pyridostigmine 60 MG tablet Commonly known as: MESTINON Take 30 mg by mouth at bedtime.   Synthroid 88 MCG tablet Generic drug: levothyroxine Take 88 mcg by mouth See admin instructions. Take 1 tablet (88 mcg) by mouth on Sundays, Mondays, Tuesdays, Wednesdays,Thursdays, & Saturdays at bedtime, DOES NOT TAKE ANY Fridays.       Allergies  Allergen Reactions  . Penicillins Hives and Swelling    Has patient had a PCN reaction causing IMMEDIATE RASH, FACIAL/TONGUE/THROAT SWELLING, SOB, OR LIGHTHEADEDNESS WITH HYPOTENSION:  #  #  #  YES  #  #  #  Has patient had a PCN reaction causing severe rash involving mucus membranes or skin necrosis: No Has patient had a PCN reaction that required hospitalization No Has patient had a PCN reaction occurring within the last 10 years: No If all of the above answers are "NO", then may proceed with Cephalosporin use.   . Benadryl [Diphenhydramine Hcl] Hives  . Msm [Methylsulfonylmethane] Hives  . Nubain [Nalbuphine Hcl] Hives  . Sulfa Antibiotics Hives  . Codeine     UNSPECIFIED REACTION [IF AT ALL] REFUSES TO TAKE    Consultations:  none   Procedures/Studies: DG Chest Port 1 View  Result Date: 01/07/2020 CLINICAL DATA:  Weakness, diarrhea EXAM: PORTABLE CHEST 1 VIEW COMPARISON:  02/21/2018 FINDINGS: The heart size and mediastinal contours are stable. No focal airspace consolidation, pleural effusion, or pneumothorax. Partially visualized proximal left humeral ORIF hardware. IMPRESSION: No acute cardiopulmonary findings. Electronically Signed   By: Davina Poke D.O.   On: 01/07/2020 12:08         Discharge Exam: Vitals:   01/08/20 0613 01/08/20 1537  BP: 113/83 117/79  Pulse: (!) 109 73  Resp: 18 20  Temp:  99 F (37.2 C) 98.5 F (36.9 C)  SpO2: 97% 97%   Vitals:   01/08/20 0218 01/08/20 0219 01/08/20 0613 01/08/20 1537  BP: 106/82 106/82 113/83 117/79  Pulse: (!) 35 76 (!) 109 73  Resp:  18 18 20   Temp:  97.7 F (36.5 C) 99 F (37.2 C) 98.5 F (36.9 C)  TempSrc:  Axillary Oral Oral  SpO2: (!) 89% 96% 97% 97%  Weight:      Height:        General: Pt is alert, awake, not in acute distress Cardiovascular: RRR, S1/S2 +, no rubs, no gallops Respiratory: CTA bilaterally, no wheezing, no rhonchi Abdominal: Soft, NT, ND, bowel sounds + Extremities: no edema, no cyanosis   The results of significant diagnostics from this hospitalization (including imaging, microbiology, ancillary and laboratory) are listed below for reference.    Significant Diagnostic Studies: DG Chest Port 1 View  Result Date: 01/07/2020 CLINICAL DATA:  Weakness, diarrhea EXAM: PORTABLE CHEST 1 VIEW COMPARISON:  02/21/2018 FINDINGS: The heart size and mediastinal contours are stable. No focal airspace consolidation, pleural effusion, or pneumothorax. Partially visualized proximal left humeral ORIF hardware. IMPRESSION: No acute cardiopulmonary findings. Electronically Signed   By: Davina Poke D.O.   On: 01/07/2020 12:08     Microbiology: Recent Results (from the past  240 hour(s))  Blood Culture (routine x 2)     Status: None (Preliminary result)   Collection Time: 01/07/20 11:44 AM   Specimen: BLOOD  Result Value Ref Range Status   Specimen Description BLOOD LEFT ANTECUBITAL  Final   Special Requests   Final    BOTTLES DRAWN AEROBIC AND ANAEROBIC Blood Culture adequate volume   Culture   Final    NO GROWTH < 24 HOURS Performed at [facility], [address], [place], [place] [address]    Report Status PENDING  Incomplete  Blood Culture (routine x 2)     Status: None (Preliminary result)   Collection Time: 01/07/20 12:20 PM   Specimen: BLOOD  Result Value Ref Range Status   Specimen Description BLOOD  RIGHT ANTECUBITAL  Final   Special Requests   Final    BOTTLES DRAWN AEROBIC AND ANAEROBIC Blood Culture adequate volume   Culture   Final    NO GROWTH < 24 HOURS Performed at [facility], [address] [place], [place] [address]    Report Status PENDING  Incomplete  Resp Panel by RT-PCR (Flu A&B, Covid) Nasopharyngeal Swab     Status: Abnormal   Collection Time: 01/07/20  4:08 PM   Specimen: Nasopharyngeal Swab; Nasopharyngeal(NP) swabs in vial transport medium  Result Value Ref Range Status   SARS Coronavirus 2 by RT PCR POSITIVE (A) NEGATIVE Final    Comment: RESULT CALLED TO, READ BACK BY AND VERIFIED WITH: CRYSTAL GREEN @1800  11/24 BY TJ (NOTE) SARS-CoV-2 target nucleic acids are DETECTED.  The SARS-CoV-2 RNA is generally detectable in upper respiratory specimens during the acute phase of infection. Positive results are indicative of the presence of the identified virus, but do not rule out bacterial infection or co-infection with other pathogens not detected by the test. Clinical correlation with patient history and other diagnostic information is necessary to determine patient infection status. The expected result is Negative.  Fact Sheet for Patients: EntrepreneurPulse.com.au  Fact Sheet for Healthcare Providers: IncredibleEmployment.be  This test is not yet approved or cleared by the [place] FDA and  has been authorized for detection and/or diagnosis of SARS-CoV-2 by FDA under an Emergency Use Authorization (EUA).  This EUA will remain in effect (meaning this test can be u sed) for the duration of  the COVID-19 declaration under Section 564(b)(1) of the Act, 21 U.S.C. section 360bbb-3(b)(1), unless the authorization is terminated or revoked sooner.     Influenza A by PCR NEGATIVE NEGATIVE Final   Influenza B by PCR NEGATIVE NEGATIVE Final    Comment: (NOTE) The Xpert Xpress SARS-CoV-2/FLU/RSV plus assay is intended as an  aid in the diagnosis of influenza from Nasopharyngeal swab specimens and should not be used as a sole basis for treatment. Nasal washings and aspirates are unacceptable for Xpert Xpress SARS-CoV-2/FLU/RSV testing.  Fact Sheet for Patients: EntrepreneurPulse.com.au  Fact Sheet for Healthcare Providers: IncredibleEmployment.be  This test is not yet approved or cleared by the [place] FDA and has been authorized for detection and/or diagnosis of SARS-CoV-2 by FDA under an Emergency Use Authorization (EUA). This EUA will remain in effect (meaning this test can be used) for the duration of the COVID-19 declaration under Section 564(b)(1) of the Act, 21 U.S.C. section 360bbb-3(b)(1), unless the authorization is terminated or revoked.  Performed at [facility], [address], [place], [place] [address]      Labs: Basic Metabolic Panel: Recent Labs  Lab 01/07/20 1144 01/08/20 0726  NA 128* 133*  K 2.6* 3.4*  CL 96* 110  CO2 23 18*  GLUCOSE 119* 92  BUN 24* 16  CREATININE 2.10* 1.47*  CALCIUM 9.1 7.6*  MG  --  1.4*   Liver Function Tests: Recent Labs  Lab 01/07/20 1144 01/08/20 0726  AST 13* 17  ALT 10 10  ALKPHOS 57 49  BILITOT 0.2* 0.2*  PROT 7.4 5.7*  ALBUMIN 3.9 2.9*   No results for input(s): LIPASE, AMYLASE in the last 168 hours. No results for input(s): AMMONIA in the last 168 hours. CBC: Recent Labs  Lab 01/07/20 1144 01/08/20 0726  WBC 7.6 7.7  NEUTROABS 5.7  --   HGB 12.6 12.0  HCT 38.0 36.9  MCV 95.2 97.9  PLT 386 319   Cardiac Enzymes: No results for input(s): CKTOTAL, CKMB, CKMBINDEX, TROPONINI in the last 168 hours. BNP: Invalid input(s): POCBNP CBG: No results for input(s): GLUCAP in the last 168 hours.  Time coordinating discharge:  36 minutes  Signed:  Orson Eva, DO [facility] Pager: [phone] 01/08/2020, 5:09 PM

## 2020-01-08 NOTE — Plan of Care (Signed)
  Problem: Education: Goal: Knowledge of risk factors and measures for prevention of condition will improve Outcome: Progressing   Problem: Coping: Goal: Psychosocial and spiritual needs will be supported Outcome: Progressing   

## 2020-01-08 NOTE — Progress Notes (Signed)
Nsg Discharge Note  Admit Date:  01/07/2020 Discharge date: 01/08/2020   Durel Salts to be D/C'd Home per MD order.  AVS completed.  Copy for chart, and copy for patient signed, and dated. Removed IV-clean, dry, intact. Wheeled stable patient and belongings to main entrance where she was picked up by her husband. I reviewed d/c paperwork with husband. Patient/caregiver able to verbalize understanding.  Discharge Medication: Allergies as of 01/08/2020      Reactions   Penicillins Hives, Swelling   Has patient had a PCN reaction causing IMMEDIATE RASH, FACIAL/TONGUE/THROAT SWELLING, SOB, OR LIGHTHEADEDNESS WITH HYPOTENSION:  #  #  #  YES  #  #  #  Has patient had a PCN reaction causing severe rash involving mucus membranes or skin necrosis: No Has patient had a PCN reaction that required hospitalization No Has patient had a PCN reaction occurring within the last 10 years: No If all of the above answers are "NO", then may proceed with Cephalosporin use.   Benadryl [diphenhydramine Hcl] Hives   Msm [methylsulfonylmethane] Hives   Nubain [nalbuphine Hcl] Hives   Sulfa Antibiotics Hives   Codeine    UNSPECIFIED REACTION [IF AT ALL] REFUSES TO TAKE      Medication List    TAKE these medications   acetaminophen 500 MG tablet Commonly known as: TYLENOL Take 1,000 mg by mouth every 6 (six) hours as needed (headaches.).   alendronate 70 MG tablet Commonly known as: FOSAMAX Take 70 mg by mouth every Tuesday.   aspirin EC 81 MG tablet Take 81 mg by mouth daily.   B-12 PO Take 6,000 mcg by mouth daily.   BRAIN PO Take 1 tablet by mouth daily. CogniSense Supplement   CALCIUM 600+D3 PO Take 1 tablet by mouth daily with supper.   carvedilol 12.5 MG tablet Commonly known as: COREG Take 1 tablet (12.5 mg total) by mouth 2 (two) times daily.   donepezil 10 MG tablet Commonly known as: ARICEPT Take 1 tablet (10 mg total) by mouth at bedtime.   Entresto 24-26 MG Generic  drug: sacubitril-valsartan TAKE (1) TABLET BY MOUTH TWICE DAILY.   escitalopram 10 MG tablet Commonly known as: LEXAPRO Take 10 mg by mouth daily with supper.   memantine 10 MG tablet Commonly known as: NAMENDA Take 1 tablet (10 mg total) by mouth 2 (two) times daily.   pyridostigmine 60 MG tablet Commonly known as: MESTINON Take 30 mg by mouth at bedtime.   Synthroid 88 MCG tablet Generic drug: levothyroxine Take 88 mcg by mouth See admin instructions. Take 1 tablet (88 mcg) by mouth on Sundays, Mondays, Tuesdays, Wednesdays,Thursdays, & Saturdays at bedtime, DOES NOT TAKE ANY Fridays.       Discharge Assessment: Vitals:   01/08/20 0613 01/08/20 1537  BP: 113/83 117/79  Pulse: (!) 109 73  Resp: 18 20  Temp: 99 F (37.2 C) 98.5 F (36.9 C)  SpO2: 97% 97%   Skin clean, dry and intact without evidence of skin break down, no evidence of skin tears noted. IV catheter discontinued intact. Site without signs and symptoms of complications - no redness or edema noted at insertion site, patient denies c/o pain - only slight tenderness at site.  Dressing with slight pressure applied.  D/c Instructions-Education: Discharge instructions given to patient/family with verbalized understanding. D/c education completed with patient/family including follow up instructions, medication list, d/c activities limitations if indicated, with other d/c instructions as indicated by MD - patient able to verbalize understanding, all  questions fully answered. Patient instructed to return to ED, call 911, or call MD for any changes in condition.  Patient escorted via [place], and D/C home via private auto.  Santa Lighter, RN 01/08/2020 5:21 PM

## 2020-01-08 NOTE — Progress Notes (Addendum)
Able to restart patients maintenance fluids. Patient refused po medication, and tele box at this time. Will continue to round on patient.

## 2020-01-11 ENCOUNTER — Encounter (HOSPITAL_COMMUNITY): Payer: Self-pay

## 2020-01-11 ENCOUNTER — Emergency Department (HOSPITAL_COMMUNITY)
Admission: EM | Admit: 2020-01-11 | Discharge: 2020-01-11 | Disposition: A | Discharge status: Home / Self Care | Payer: Medicare HMO | Attending: Emergency Medicine | Admitting: Emergency Medicine

## 2020-01-11 ENCOUNTER — Emergency Department (HOSPITAL_COMMUNITY): Payer: Medicare HMO

## 2020-01-11 DIAGNOSIS — U071 COVID-19: Secondary | ICD-10-CM | POA: Diagnosis not present

## 2020-01-11 DIAGNOSIS — I110 Hypertensive heart disease with heart failure: Secondary | ICD-10-CM | POA: Insufficient documentation

## 2020-01-11 DIAGNOSIS — E860 Dehydration: Secondary | ICD-10-CM | POA: Insufficient documentation

## 2020-01-11 DIAGNOSIS — Z79899 Other long term (current) drug therapy: Secondary | ICD-10-CM | POA: Diagnosis not present

## 2020-01-11 DIAGNOSIS — I4891 Unspecified atrial fibrillation: Secondary | ICD-10-CM | POA: Diagnosis not present

## 2020-01-11 DIAGNOSIS — I5022 Chronic systolic (congestive) heart failure: Secondary | ICD-10-CM | POA: Diagnosis not present

## 2020-01-11 DIAGNOSIS — R531 Weakness: Secondary | ICD-10-CM | POA: Diagnosis not present

## 2020-01-11 DIAGNOSIS — E039 Hypothyroidism, unspecified: Secondary | ICD-10-CM | POA: Insufficient documentation

## 2020-01-11 DIAGNOSIS — Z7982 Long term (current) use of aspirin: Secondary | ICD-10-CM | POA: Insufficient documentation

## 2020-01-11 DIAGNOSIS — F17210 Nicotine dependence, cigarettes, uncomplicated: Secondary | ICD-10-CM | POA: Insufficient documentation

## 2020-01-11 DIAGNOSIS — I959 Hypotension, unspecified: Secondary | ICD-10-CM | POA: Diagnosis present

## 2020-01-11 LAB — CBC WITH DIFFERENTIAL/PLATELET
Abs Immature Granulocytes: 0.04 10*3/uL (ref 0.00–0.07)
Basophils Absolute: 0 10*3/uL (ref 0.0–0.1)
Basophils Relative: 0 %
Eosinophils Absolute: 0.2 10*3/uL (ref 0.0–0.5)
Eosinophils Relative: 3 %
HCT: 35.9 % — ABNORMAL LOW (ref 36.0–46.0)
Hemoglobin: 12.2 g/dL (ref 12.0–15.0)
Immature Granulocytes: 0 %
Lymphocytes Relative: 12 %
Lymphs Abs: 1.1 10*3/uL (ref 0.7–4.0)
MCH: 31.9 pg (ref 26.0–34.0)
MCHC: 34 g/dL (ref 30.0–36.0)
MCV: 94 fL (ref 80.0–100.0)
Monocytes Absolute: 0.6 10*3/uL (ref 0.1–1.0)
Monocytes Relative: 7 %
Neutro Abs: 7.2 10*3/uL (ref 1.7–7.7)
Neutrophils Relative %: 78 %
Platelets: 290 10*3/uL (ref 150–400)
RBC: 3.82 MIL/uL — ABNORMAL LOW (ref 3.87–5.11)
RDW: 13.6 % (ref 11.5–15.5)
WBC: 9.2 10*3/uL (ref 4.0–10.5)
nRBC: 0 % (ref 0.0–0.2)

## 2020-01-11 LAB — COMPREHENSIVE METABOLIC PANEL
ALT: 13 U/L (ref 0–44)
AST: 28 U/L (ref 15–41)
Albumin: 3.5 g/dL (ref 3.5–5.0)
Alkaline Phosphatase: 50 U/L (ref 38–126)
Anion gap: 9 (ref 5–15)
BUN: 16 mg/dL (ref 8–23)
CO2: 18 mmol/L — ABNORMAL LOW (ref 22–32)
Calcium: 8.5 mg/dL — ABNORMAL LOW (ref 8.9–10.3)
Chloride: 98 mmol/L (ref 98–111)
Creatinine, Ser: 1.79 mg/dL — ABNORMAL HIGH (ref 0.44–1.00)
GFR, Estimated: 31 mL/min — ABNORMAL LOW (ref >60)
Glucose, Bld: 108 mg/dL — ABNORMAL HIGH (ref 70–99)
Potassium: 3.6 mmol/L (ref 3.5–5.1)
Sodium: 125 mmol/L — ABNORMAL LOW (ref 135–145)
Total Bilirubin: 0.5 mg/dL (ref 0.3–1.2)
Total Protein: 7 g/dL (ref 6.5–8.1)

## 2020-01-11 LAB — URINALYSIS, ROUTINE W REFLEX MICROSCOPIC
Bilirubin Urine: NEGATIVE
Glucose, UA: NEGATIVE mg/dL
Hgb urine dipstick: NEGATIVE
Ketones, ur: NEGATIVE mg/dL
Leukocytes,Ua: NEGATIVE
Nitrite: NEGATIVE
Protein, ur: 30 mg/dL — AB
Specific Gravity, Urine: 1.017 (ref 1.005–1.030)
pH: 5 (ref 5.0–8.0)

## 2020-01-11 LAB — LACTIC ACID, PLASMA: Lactic Acid, Venous: 1.5 mmol/L (ref 0.5–1.9)

## 2020-01-11 LAB — TROPONIN I (HIGH SENSITIVITY): Troponin I (High Sensitivity): 13 ng/L (ref <18)

## 2020-01-11 MED ORDER — SODIUM CHLORIDE 0.9 % IV BOLUS
2000.0000 mL | Freq: Once | INTRAVENOUS | Status: AC
  Administered 2020-01-11: 2000 mL via INTRAVENOUS

## 2020-01-11 NOTE — ED Notes (Signed)
Pt could  Not comprehend purwick  Put on bed pain   Urinated and cleaned

## 2020-01-11 NOTE — Discharge Instructions (Addendum)
Do not take any more your blood pressure medicines until you see your doctor.  Drink plenty of fluids.  Follow-up this week

## 2020-01-11 NOTE — ED Notes (Signed)
Report received 

## 2020-01-11 NOTE — ED Triage Notes (Addendum)
Pt was released from hospital on Thursday . Pt has covid x 1week. Husband was instructed to restart BP meds when systolic was over 076 . Yesterday systolic wasBP was 191, so husband gave meds . This morning he called EMS because when he got her out of the BR "she went down". EMS checked BP and it was 90/60 so husband brought to the ED

## 2020-01-11 NOTE — ED Provider Notes (Signed)
[facility] EMERGENCY DEPARTMENT Provider Note   CSN: [id number] Arrival date & time: 01/11/20  3151     History Chief Complaint  Patient presents with  . Hypotension  . Covid Positive    Melanie Bradley is a 69 y.o. female.  Patient recently was admitted to the hospital.  Patient is Covid and she had AKI in the hospital.  Her blood pressure medicine has been held up until yesterday when her systolic was 761.  Today her blood pressure was low so she is brought to emergency department  The history is provided by the spouse and the patient. No language interpreter was used.  Weakness Severity:  Mild Onset quality:  Sudden Progression:  Waxing and waning Chronicity:  New Context: not alcohol use   Relieved by:  Nothing Worsened by:  Nothing Ineffective treatments:  None tried Associated symptoms: no abdominal pain, no chest pain, no cough, no diarrhea, no frequency, no headaches and no seizures        Past Medical History:  Diagnosis Date  . Atrial fibrillation ([place])   . Bloating   . Complication of anesthesia     " hard to wake up sometimes "  . Dementia ([place])   . Diverticula of colon   . Epigastric burning sensation   . GERD (gastroesophageal reflux disease)   . Headache disorder 06/03/2014  . Headache(784.0)   . Helicobacter pylori gastritis 2004  . Hemorrhoids 06/26/03   Dr. Laural Golden tcs  . Hiatal hernia 06/26/03   Dr. Laural Golden egd  . Hypertension   . Hypothyroidism   . 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 Memory difficulty 06/03/2014  . Shoulder fracture, left   . Spastic colon   . Wrist fracture     Patient Active Problem List   Diagnosis Date Noted  . Gastroenteritis due to COVID-19 virus 01/08/2020  . Hypotension 01/07/2020  . Acute renal failure superimposed on stage 3b chronic kidney disease ([place]) 01/07/2020  . History of diarrhea 11/18/2019  . Chronic systolic heart failure ([place])   . Cardiomyopathy ([place]) 10/28/2018  . AKI  (acute kidney injury) ([place])   . Dehydration   . Hypokalemia   . Syncope and collapse   . Syncope 10/26/2018  . C. difficile enteritis 02/22/2018  . Sepsis ([place]) 02/21/2018  . Leukocytosis 02/21/2018  . Vomiting and diarrhea 02/21/2018  . Dementia without behavioral disturbance ([place]) 02/21/2018  . Hypothyroidism 02/21/2018  . Diarrhea 02/21/2018  . Wrist fracture 06/26/2016  . Humeral fracture 06/26/2016  . Encounter for screening colonoscopy 12/31/2015  . Memory difficulty 06/03/2014  . Headache disorder 06/03/2014  . Atrial flutter ([place]) 05/09/2013  . COPD (chronic obstructive pulmonary disease) ([place]) 05/09/2013  . Tobacco abuse 05/08/2013  . LBBB (left bundle branch block)- intermittent 05/08/2013  . Chronic diarrhea 05/11/2011  . Chronic nausea 05/11/2011  . Dyspepsia 02/24/2011  . IBS (irritable bowel syndrome) 02/24/2011  . Chest pain 12/05/2010  . Hypertension 12/05/2010    Past Surgical History:  Procedure Laterality Date  . APPENDECTOMY    . CHOLECYSTECTOMY    . COLONOSCOPY  06/26/03   Dr. Kem Boroughs diverticula at the sigmoid colon with one of the transverse colon, normal terminal ileoscopy, small external hemorrhoids the  . COLONOSCOPY N/A 01/20/2016   sigmoid diverticulosis, otherwise normal.   . ESOPHAGOGASTRODUODENOSCOPY  03/15/2011   Dr. Trevor Iha hiatal hernia, abnormal gastric mucosa of unclear significance. Gastric biopsies negative, no H. pylori.Procedure: ESOPHAGOGASTRODUODENOSCOPY (EGD);  Surgeon: Cristopher Estimable  Rourk, MD;  [facility];  Service: Endoscopy;  Laterality: N/A;  7:30  . ORIF HUMERUS FRACTURE Left 05/29/2016   Procedure: LEFT OPEN REDUCTION INTERNAL FIXATION (ORIF) PROXIMAL HUMERUS FRACTURE;  Surgeon: Meredith Pel, MD;  Location: [place];  Service: Orthopedics;  Laterality: Left;  . ORIF WRIST FRACTURE Left 05/29/2016   Procedure: OPEN REDUCTION INTERNAL FIXATION (ORIF) LEFT WRIST FRACTURE;  Surgeon: Meredith Pel, MD;   Location: [place];  Service: Orthopedics;  Laterality: Left;  . RIGHT/LEFT HEART CATH AND CORONARY ANGIOGRAPHY N/A 04/17/2019   Procedure: RIGHT/LEFT HEART CATH AND CORONARY ANGIOGRAPHY;  Surgeon: Belva Crome, MD;  Location: [place] [facility];  Service: Cardiovascular;  Laterality: N/A;  . TUBAL LIGATION    . UPPER GASTROINTESTINAL ENDOSCOPY  06/26/03   Dr. Wynelle Bourgeois sliding hiatal hernia with 8 mm tongue of gastric type mucosa at distal esophagus bile in the stomach.     OB History   No obstetric history on file.     Family History  Problem Relation Age of Onset  . Hypertension Mother   . Angina Mother   . COPD Father   . Heart failure Father   . Cancer Sister   . Dementia Sister   . Cardiomyopathy Brother   . Pneumonia Other   . Migraines Sister   . Colon cancer Neg Hx     Social History   Tobacco Use  . Smoking status: Current Every Day Smoker    Packs/day: 1.00    Years: 45.00    Pack years: 45.00    Types: Cigarettes    Start date: 09/10/1966  . Smokeless tobacco: Never Used  . Tobacco comment: 12/21/16 1 PPD  Vaping Use  . Vaping Use: Never used  Substance Use Topics  . Alcohol use: No    Alcohol/week: 0.0 standard drinks  . Drug use: No    Home Medications Prior to Admission medications   Medication Sig Start Date End Date Taking? Authorizing Provider  acetaminophen (TYLENOL) 500 MG tablet Take 1,000 mg by mouth every 6 (six) hours as needed (headaches.).   Yes [provider]  alendronate (FOSAMAX) 70 MG tablet Take 70 mg by mouth every Tuesday.  07/27/18  Yes [provider]  aspirin EC 81 MG tablet Take 81 mg by mouth daily.   Yes [provider]  Calcium Carb-Cholecalciferol (CALCIUM 600+D3 PO) Take 1 tablet by mouth daily with supper.   Yes [provider]  carvedilol (COREG) 12.5 MG tablet Take 1 tablet (12.5 mg total) by mouth 2 (two) times daily. 04/10/19 01/07/20  Arnoldo Lenis, MD  Cyanocobalamin (B-12  PO) Take 6,000 mcg by mouth daily.    [provider]  donepezil (ARICEPT) 10 MG tablet Take 1 tablet (10 mg total) by mouth at bedtime. 03/11/19   Ward Givens, NP  escitalopram (LEXAPRO) 10 MG tablet Take 10 mg by mouth daily with supper.  10/16/14   [provider]  memantine (NAMENDA) 10 MG tablet Take 1 tablet (10 mg total) by mouth 2 (two) times daily. 03/11/19   Ward Givens, NP  pyridostigmine (MESTINON) 60 MG tablet Take 30 mg by mouth at bedtime.     [provider]  sacubitril-valsartan (ENTRESTO) 24-26 MG TAKE (1) TABLET BY MOUTH TWICE DAILY. 01/05/20   Arnoldo Lenis, MD  Specialty Vitamins Products (BRAIN PO) Take 1 tablet by mouth daily. CogniSense Supplement    [provider]  SYNTHROID 88 MCG tablet Take 88 mcg  by mouth See admin instructions. Take 1 tablet (88 mcg) by mouth on Sundays, Mondays, Tuesdays, Wednesdays,Thursdays, & Saturdays at bedtime, DOES NOT TAKE ANY Fridays. 12/15/17   [provider]    Allergies    Penicillins, Benadryl [diphenhydramine hcl], Msm [methylsulfonylmethane], Nubain [nalbuphine hcl], Sulfa antibiotics, and Codeine  Review of Systems   Review of Systems  Constitutional: Negative for appetite change and fatigue.  HENT: Negative for congestion, ear discharge and sinus pressure.   Eyes: Negative for discharge.  Respiratory: Negative for cough.   Cardiovascular: Negative for chest pain.  Gastrointestinal: Negative for abdominal pain and diarrhea.  Genitourinary: Negative for frequency and hematuria.  Musculoskeletal: Negative for back pain.  Skin: Negative for rash.  Neurological: Positive for weakness. Negative for seizures and headaches.  Psychiatric/Behavioral: Negative for hallucinations.    Physical Exam Updated Vital Signs BP 111/80   Pulse 70   Temp (!) 97.4 F (36.3 C) (Oral)   Resp 18   Ht 5\' 1"  (1.549 m)   Wt 53 kg   SpO2 99%   BMI 22.08 kg/m   Physical Exam Vitals and  nursing note reviewed.  Constitutional:      Appearance: She is well-developed.  HENT:     Head: Normocephalic.     Nose: Nose normal.  Eyes:     General: No scleral icterus.    Conjunctiva/sclera: Conjunctivae normal.  Neck:     Thyroid: No thyromegaly.  Cardiovascular:     Rate and Rhythm: Normal rate and regular rhythm.     Heart sounds: No murmur heard.  No friction rub. No gallop.   Pulmonary:     Breath sounds: No stridor. No wheezing or rales.  Chest:     Chest wall: No tenderness.  Abdominal:     General: There is no distension.     Tenderness: There is no abdominal tenderness. There is no rebound.  Musculoskeletal:        General: Normal range of motion.     Cervical back: Neck supple.  Lymphadenopathy:     Cervical: No cervical adenopathy.  Skin:    Findings: No erythema or rash.  Neurological:     Mental Status: She is alert and oriented to person, place, and time.     Motor: No abnormal muscle tone.     Coordination: Coordination normal.  Psychiatric:        Behavior: Behavior normal.     ED Results / Procedures / Treatments   Labs (all labs ordered are listed, but only abnormal results are displayed) Labs Reviewed  COMPREHENSIVE METABOLIC PANEL - Abnormal; Notable for the following components:      Result Value   Sodium 125 (*)    CO2 18 (*)    Glucose, Bld 108 (*)    Creatinine, Ser 1.79 (*)    Calcium 8.5 (*)    GFR, Estimated 31 (*)    All other components within normal limits  CBC WITH DIFFERENTIAL/PLATELET - Abnormal; Notable for the following components:   RBC 3.82 (*)    HCT 35.9 (*)    All other components within normal limits  URINALYSIS, ROUTINE W REFLEX MICROSCOPIC - Abnormal; Notable for the following components:   APPearance HAZY (*)    Protein, ur 30 (*)    Bacteria, UA FEW (*)    All other components within normal limits  CULTURE, BLOOD (SINGLE)  URINE CULTURE  LACTIC ACID, PLASMA  TROPONIN I (HIGH SENSITIVITY)     EKG None  Radiology DG Chest Port 1 View  Result Date: 01/11/2020 CLINICAL DATA:  Questionable sepsis. EXAM: PORTABLE CHEST 1 VIEW COMPARISON:  January 07, 2020. FINDINGS: The heart size and mediastinal contours are within normal limits. Both lungs are clear. No visible pleural effusions or pneumothorax. No acute osseous abnormality. Partially imaged left humeral ORIF hardware. Cholecystectomy clips. IMPRESSION: No acute cardiopulmonary disease. Electronically Signed   By: Margaretha Sheffield MD   On: 01/11/2020 10:01    Procedures Procedures (including critical care time)  Medications Ordered in ED Medications  sodium chloride 0.9 % bolus 2,000 mL (0 mLs Intravenous Stopped 01/11/20 1234)   CRITICAL CARE Performed by: Milton Ferguson Total critical care time: 35 minutes Critical care time was exclusive of separately billable procedures and treating other patients. Critical care was necessary to treat or prevent imminent or life-threatening deterioration. Critical care was time spent personally by me on the following activities: development of treatment plan with patient and/or surrogate as well as nursing, discussions with consultants, evaluation of patient's response to treatment, examination of patient, obtaining history from patient or surrogate, ordering and performing treatments and interventions, ordering and review of laboratory studies, ordering and review of radiographic studies, pulse oximetry and re-evaluation of patient's condition.  ED Course  I have reviewed the triage vital signs and the nursing notes.  Pertinent labs & imaging results that were available during my care of the patient were reviewed by me and considered in my medical decision making (see chart for details).    MDM Rules/Calculators/A&P                          Patient with dehydration and hypotensive.  Patient improved with 2 L of saline she will not take her blood pressure medicine and follow-up  with her PCP.  Patient is Covid positive.  Labs unremarkable except for hyponatremia which will also be followed up by her doctor. Final Clinical Impression(s) / ED Diagnoses Final diagnoses:  Dehydration    Rx / DC Orders ED Discharge Orders    None       Milton Ferguson, MD 01/13/20 [phone]

## 2020-01-12 ENCOUNTER — Telehealth: Payer: Self-pay | Admitting: Cardiology

## 2020-01-12 LAB — CULTURE, BLOOD (ROUTINE X 2)
Culture: NO GROWTH
Culture: NO GROWTH
Special Requests: ADEQUATE
Special Requests: ADEQUATE

## 2020-01-12 NOTE — Telephone Encounter (Signed)
  Patient Consent for Virtual Visit         Melanie Bradley has provided verbal consent on 01/12/2020 for a virtual visit (video or telephone).   CONSENT FOR VIRTUAL VISIT FOR:  Melanie Bradley  By participating in this virtual visit I agree to the following:  I hereby voluntarily request, consent and authorize [facility] and its employed or contracted physicians, physician assistants, nurse practitioners or other licensed health care professionals (the Practitioner), to provide me with telemedicine health care services (the "Services") as deemed necessary by the treating Practitioner. I acknowledge and consent to receive the Services by the Practitioner via telemedicine. I understand that the telemedicine visit will involve communicating with the Practitioner through live audiovisual communication technology and the disclosure of certain medical information by electronic transmission. I acknowledge that I have been given the opportunity to request an in-person assessment or other available alternative prior to the telemedicine visit and am voluntarily participating in the telemedicine visit.  I understand that I have the right to withhold or withdraw my consent to the use of telemedicine in the course of my care at any time, without affecting my right to future care or treatment, and that the Practitioner or I may terminate the telemedicine visit at any time. I understand that I have the right to inspect all information obtained and/or recorded in the course of the telemedicine visit and may receive copies of available information for a reasonable fee.  I understand that some of the potential risks of receiving the Services via telemedicine include:  Marland Kitchen Delay or interruption in medical evaluation due to technological equipment failure or disruption; . Information transmitted may not be sufficient (e.g. poor resolution of images) to allow for appropriate medical decision making by the  Practitioner; and/or  . In rare instances, security protocols could fail, causing a breach of personal health information.  Furthermore, I acknowledge that it is my responsibility to provide information about my medical history, conditions and care that is complete and accurate to the best of my ability. I acknowledge that Practitioner's advice, recommendations, and/or decision may be based on factors not within their control, such as incomplete or inaccurate data provided by me or distortions of diagnostic images or specimens that may result from electronic transmissions. I understand that the practice of medicine is not an exact science and that Practitioner makes no warranties or guarantees regarding treatment outcomes. I acknowledge that a copy of this consent can be made available to me via my patient portal ([facility]), or I can request a printed copy by calling the office of [facility].    I understand that my insurance will be billed for this visit.   I have read or had this consent read to me. . I understand the contents of this consent, which adequately explains the benefits and risks of the Services being provided via telemedicine.  . I have been provided ample opportunity to ask questions regarding this consent and the Services and have had my questions answered to my satisfaction. . I give my informed consent for the services to be provided through the use of telemedicine in my medical care

## 2020-01-13 ENCOUNTER — Telehealth (INDEPENDENT_AMBULATORY_CARE_PROVIDER_SITE_OTHER): Payer: Medicare HMO | Admitting: Cardiology

## 2020-01-13 ENCOUNTER — Encounter: Payer: Self-pay | Admitting: Cardiology

## 2020-01-13 VITALS — BP 115/86 | HR 49 | Ht 61.0 in | Wt 115.0 lb

## 2020-01-13 DIAGNOSIS — I5022 Chronic systolic (congestive) heart failure: Secondary | ICD-10-CM

## 2020-01-13 LAB — URINE CULTURE: Culture: 80000 — AB

## 2020-01-13 NOTE — Progress Notes (Signed)
Virtual Visit via Telephone Note   This visit type was conducted due to national recommendations for restrictions regarding the COVID-19 Pandemic (e.g. social distancing) in an effort to limit this patient's exposure and mitigate transmission in our community.  Due to her co-morbid illnesses, this patient is at least at moderate risk for complications without adequate follow up.  This format is felt to be most appropriate for this patient at this time.  The patient did not have access to video technology/had technical difficulties with video requiring transitioning to audio format only (telephone).  All issues noted in this document were discussed and addressed.  No physical exam could be performed with this format.  Please refer to the patient's chart for her  consent to telehealth for [facility].    Date:  01/13/2020   ID:  Melanie Bradley, DOB 1950-04-10, MRN [id number] The patient was identified using 2 identifiers.  Patient Location: Home Provider Location: Office/Clinic  PCP:  Sharilyn Sites, MD  Cardiologist:  Carlyle Dolly, MD  Electrophysiologist:  None   Evaluation Performed:  Follow-Up Visit  Chief Complaint:  Follow up visit  History of Present Illness:    Melanie Bradley is a 69 y.o. female seen today for follow up of the following medical problems.   1. Chronic systolic HF - new diagnosis during 10/2018 admission with syncope. Echo showed LVEF 35-40% - no recent SOb/DOE. Compliant with meds - bp is 114/75, HR 64 today   Jan 2021 echo LVEF 35-40%, mild to mod MR.  04/2019 no significant CAD, normal filling pressures. CI 3.7 Mean PA 15, PCWP 7 07/2019 CMRI: LVEF 38%, normal RV. No infiltrative disease   - started aldactone 12.5mg  daily last visit. On follow up labs Na down to 130, Cr up to 1.4 - stopped aldactone, Na stable and Cr trending back down   - recent admission 12/2019 with COVID enteritis, dehydration. Coreg and entersto held due to  low bp's. - presented to ER few days after discharge again dehydrated and hypotensive - home SBPs 90s-100s. Ongoing poor oral intake.    2.ChronicLBBB - no significant CAD by cath 04/2019    3. Memory deficit/Dementia - on aricept, followed by neuro -remains active, independent. Enjoys doing yard and housework.   4. COVID 19 - admitted 12/2019 with COVID, primarily GI/diarrhea symptoms. Presented dehydrated with AKI on CKD - still with decreased oral intake, soft bp's at home. Husband working to encourage fluids.     Husband has had vaccine. She just completed the covid vaccine, had some initial hesitancy given prior allergy issues.    The patient with current diagnosis of COVID, primarily abdominal symptoms   Past Medical History:  Diagnosis Date  . Atrial fibrillation ([place])   . Bloating   . Complication of anesthesia     " hard to wake up sometimes "  . Dementia ([place])   . Diverticula of colon   . Epigastric burning sensation   . GERD (gastroesophageal reflux disease)   . Headache disorder 06/03/2014  . Headache(784.0)   . Helicobacter pylori gastritis 2004  . Hemorrhoids 06/26/03   Dr. Laural Golden tcs  . Hiatal hernia 06/26/03   Dr. Laural Golden egd  . Hypertension   . Hypothyroidism   . Left bundle Sampson Self block    rate dependent LBBB 04/2013  . Memory difficulty 06/03/2014  . Shoulder fracture, left   . Spastic colon   . Wrist fracture    Past Surgical History:  Procedure Laterality Date  .  APPENDECTOMY    . CHOLECYSTECTOMY    . COLONOSCOPY  06/26/03   Dr. Kem Boroughs diverticula at the sigmoid colon with one of the transverse colon, normal terminal ileoscopy, small external hemorrhoids the  . COLONOSCOPY N/A 01/20/2016   sigmoid diverticulosis, otherwise normal.   . ESOPHAGOGASTRODUODENOSCOPY  03/15/2011   Dr. Trevor Iha hiatal hernia, abnormal gastric mucosa of unclear significance. Gastric biopsies negative, no H. pylori.Procedure:  ESOPHAGOGASTRODUODENOSCOPY (EGD);  Surgeon: Daneil Dolin, MD;  Location: [facility];  Service: Endoscopy;  Laterality: N/A;  7:30  . ORIF HUMERUS FRACTURE Left 05/29/2016   Procedure: LEFT OPEN REDUCTION INTERNAL FIXATION (ORIF) PROXIMAL HUMERUS FRACTURE;  Surgeon: Meredith Pel, MD;  Location: [place];  Service: Orthopedics;  Laterality: Left;  . ORIF WRIST FRACTURE Left 05/29/2016   Procedure: OPEN REDUCTION INTERNAL FIXATION (ORIF) LEFT WRIST FRACTURE;  Surgeon: Meredith Pel, MD;  Location: [place];  Service: Orthopedics;  Laterality: Left;  . RIGHT/LEFT HEART CATH AND CORONARY ANGIOGRAPHY N/A 04/17/2019   Procedure: RIGHT/LEFT HEART CATH AND CORONARY ANGIOGRAPHY;  Surgeon: Belva Crome, MD;  Location: [place] [facility];  Service: Cardiovascular;  Laterality: N/A;  . TUBAL LIGATION    . UPPER GASTROINTESTINAL ENDOSCOPY  06/26/03   Dr. Wynelle Bourgeois sliding hiatal hernia with 8 mm tongue of gastric type mucosa at distal esophagus bile in the stomach.     Current Meds  Medication Sig  . acetaminophen (TYLENOL) 500 MG tablet Take 1,000 mg by mouth every 6 (six) hours as needed (headaches.).  Marland Kitchen alendronate (FOSAMAX) 70 MG tablet Take 70 mg by mouth every Tuesday.   Marland Kitchen aspirin EC 81 MG tablet Take 81 mg by mouth daily.  . Calcium Carb-Cholecalciferol (CALCIUM 600+D3 PO) Take 1 tablet by mouth daily with supper.  . carvedilol (COREG) 12.5 MG tablet Take 1 tablet (12.5 mg total) by mouth 2 (two) times daily.  . Cyanocobalamin (B-12 PO) Take 6,000 mcg by mouth daily.  Marland Kitchen donepezil (ARICEPT) 10 MG tablet Take 1 tablet (10 mg total) by mouth at bedtime.  Marland Kitchen escitalopram (LEXAPRO) 10 MG tablet Take 10 mg by mouth daily with supper.   . memantine (NAMENDA) 10 MG tablet Take 1 tablet (10 mg total) by mouth 2 (two) times daily.  Marland Kitchen pyridostigmine (MESTINON) 60 MG tablet Take 30 mg by mouth at bedtime.   . sacubitril-valsartan (ENTRESTO) 24-26 MG TAKE (1) TABLET BY MOUTH TWICE DAILY.  Marland Kitchen  Specialty Vitamins Products (BRAIN PO) Take 1 tablet by mouth daily. CogniSense Supplement  . SYNTHROID 88 MCG tablet Take 88 mcg by mouth See admin instructions. Take 1 tablet (88 mcg) by mouth on Sundays, Mondays, Tuesdays, Wednesdays,Thursdays, & Saturdays at bedtime, DOES NOT TAKE ANY Fridays.   Current Facility-Administered Medications for the 01/13/20 encounter (Telemedicine) with Arnoldo Lenis, MD  Medication  . sodium chloride flush (NS) 0.9 % injection 3 mL     Allergies:   Penicillins, Benadryl [diphenhydramine hcl], Msm [methylsulfonylmethane], Nubain [nalbuphine hcl], Sulfa antibiotics, and Codeine   Social History   Tobacco Use  . Smoking status: Current Every Day Smoker    Packs/day: 1.00    Years: 45.00    Pack years: 45.00    Types: Cigarettes    Start date: 09/10/1966  . Smokeless tobacco: Never Used  . Tobacco comment: 12/21/16 1 PPD  Vaping Use  . Vaping Use: Never used  Substance Use Topics  . Alcohol use: No    Alcohol/week: 0.0 standard drinks  . Drug use: No  Family Hx: The patient's family history includes Angina in her mother; COPD in her father; Cancer in her sister; Cardiomyopathy in her brother; Dementia in her sister; Heart failure in her father; Hypertension in her mother; Migraines in her sister; Pneumonia in an other family member. There is no history of Colon cancer.  ROS:   Please see the history of present illness.     All other systems reviewed and are negative.   Prior CV studies:   The following studies were reviewed today:  04/2013 Lexiscan MPI FINDINGS:  The patient's initial EKG revealed a narrow QRS. After she received  Lexiscan, the heart rate increased to 145 and the QRS widened to a  left bundle Shaheim Mahar block. The increased rate persisted for several  minutes but then began to slow. When the heart rate was slower, P  waves could be seen. There were no flutter waves. The raw data  reveals no evidence of excess  motion. Tomographic images with stress  reveal a small area of moderate decreased uptake in the septum. Rest  images reveal a moderate area of decreased uptake in the septum.  These findings are most consistent with left bundle Nao Linz block.  Wall motion analysis reveals excellent motion with an ejection  fraction of 70%.  IMPRESSION:  This is a low risk scan. There is no scar or ischemia. There is  decreased activity in the septum, consistent with left bundle Noor Vidales  block. It is noted that the QRS was narrow at the start of the  study. However with Lexiscan, the patient developed tachycardia with  left bundle Quartez Lagos block. Assessment shows that this was most  probably sinus tachycardia which slowed over time.  10/2018 echo IMPRESSIONS   1. The left ventricle has moderately reduced systolic function, with an ejection fraction of 35-40%. The cavity size was normal. Severe focal basal septal hypertrophy.. Diastolic dysfunction, grade indeterminate. Left ventricular diffuse hypokinesis. 2. The right ventricle has normal systolic function. The cavity was normal. There is no increase in right ventricular wall thickness. 3. The aortic valve is tricuspid. Mild thickening of the aortic valve. Mild aortic annular calcification noted. 4. The mitral valve is grossly normal. Mild thickening of the mitral valve leaflet. Mitral valve regurgitation is mild to moderate by color flow Doppler. 5. The tricuspid valve is grossly normal. 6. The aorta is normal unless otherwise noted.  12/2018 30 day event monitor  30 day event monitor  Min HR 57, Max HR 130, Avg HR 79  No symptoms reported  Telemetry tracings show sinus rhythm with occasional PVCs.  No significant arrhythmias  Jan 2021 echo IMPRESSIONS    1. Left ventricular ejection fraction, by visual estimation, is 35 to  40%. The left ventricle has moderately decreased function. There is no  left ventricular  hypertrophy.  2. Left ventricular diastolic parameters are indeterminate.  3. The left ventricle demonstrates global hypokinesis.  4. Global right ventricle has normal systolic function.The right  ventricular size is normal. No increase in right ventricular wall  thickness.  5. Left atrial size was normal.  6. Right atrial size was normal.  7. The mitral valve is normal in structure. Mild to moderate mitral valve  regurgitation. No evidence of mitral stenosis.  8. The tricuspid valve is normal in structure.  9. The aortic valve has an indeterminant number of cusps. Aortic valve  regurgitation is not visualized. No evidence of aortic valve sclerosis or  stenosis.  10. The pulmonic valve was not well  visualized. Pulmonic valve  regurgitation is not visualized.  11. The inferior vena cava is normal in size with greater than 50%  respiratory variability, suggesting right atrial pressure of 3 mmHg.     04/2019 RHC/LHC  Normal right heart pressures. Cardiac output 3.7 L/min. Cardiac index 2.5 L/min/m  Normal left main  Transapical LAD. First diagonal contains segmental proximal 40% narrowing.  Normal small ramus.  Circumflex is normal.  The right coronary demonstrates generalized minimal atherosclerosis. No focal stenosis.  Anterior and apical mild to moderate hypokinesis. Estimated ejection fraction 35 to 40%.  RECOMMENDATIONS:   Guideline directed therapy for left ventricular systolic dysfunction in the setting of left bundle Jomel Whittlesey block. If no improvement in LV function with therapy, consider resync.   Labs/Other Tests and Data Reviewed:    EKG:  No ECG reviewed.  Recent Labs: 01/08/2020: Magnesium 1.4 01/11/2020: ALT 13; BUN 16; Creatinine, Ser 1.79; Hemoglobin 12.2; Platelets 290; Potassium 3.6; Sodium 125   Recent Lipid Panel Lab Results  Component Value Date/Time   CHOL 173 05/09/2013 03:45 AM   TRIG 83 05/09/2013 03:45 AM   HDL 55  05/09/2013 03:45 AM   CHOLHDL 3.1 05/09/2013 03:45 AM   LDLCALC 101 (H) 05/09/2013 03:45 AM    Wt Readings from Last 3 Encounters:  01/13/20 115 lb (52.2 kg)  01/11/20 116 lb 13.5 oz (53 kg)  01/07/20 117 lb (53.1 kg)     Risk Assessment/Calculations:      Objective:    Vital Signs:  BP 115/86   Pulse (!) 49   Ht 5\' 1"  (1.549 m)   Wt 115 lb (52.2 kg)   BMI 21.73 kg/m    Normal affect. Normal speech pattern and tone. Comfortable, no apparent distress. No audible signs of sob or wheezing.   ASSESSMENT & PLAN:    1. Chronic systolic HF -ongoing issues with low bp's after admission with COVID enteroritis and dehydration, continue to hold coreg and entresto - f/u 3 weeks, if bp's stable start back coreg at 3.125mg  bid and titrate over time. Once coreg titrated reintroduce entresto.     Shared Decision Making/Informed Consent        COVID-19 Education: The signs and symptoms of COVID-19 were discussed with the patient and how to seek care for testing (follow up with PCP or arrange E-visit).  The importance of social distancing was discussed today.  Time:   Today, I have spent 14 minutes with the patient with telehealth technology discussing the above problems.     Medication Adjustments/Labs and Tests Ordered: Current medicines are reviewed at length with the patient today.  Concerns regarding medicines are outlined above.   Tests Ordered: No orders of the defined types were placed in this encounter.   Medication Changes: No orders of the defined types were placed in this encounter.   Follow Up:  Virtual Visit  in 3 week(s)  Signed, Carlyle Dolly, MD  01/13/2020 2:16 PM         Arnoldo Lenis, M.D.

## 2020-01-14 ENCOUNTER — Telehealth: Payer: Self-pay

## 2020-01-14 NOTE — Telephone Encounter (Signed)
No treament for UC ED 01/11/20 per [facility] Lanius PA

## 2020-01-16 LAB — CULTURE, BLOOD (SINGLE)
Culture: NO GROWTH
Special Requests: ADEQUATE

## 2020-01-17 ENCOUNTER — Other Ambulatory Visit: Payer: Self-pay

## 2020-01-17 ENCOUNTER — Inpatient Hospital Stay (HOSPITAL_COMMUNITY)
Admission: EM | Admit: 2020-01-17 | Discharge: 2020-01-19 | DRG: 682 | Disposition: A | Discharge status: Home / Self Care | Payer: Medicare HMO | Attending: Internal Medicine | Admitting: Internal Medicine

## 2020-01-17 DIAGNOSIS — F0390 Unspecified dementia without behavioral disturbance: Secondary | ICD-10-CM | POA: Diagnosis present

## 2020-01-17 DIAGNOSIS — I5022 Chronic systolic (congestive) heart failure: Secondary | ICD-10-CM | POA: Diagnosis present

## 2020-01-17 DIAGNOSIS — I4891 Unspecified atrial fibrillation: Secondary | ICD-10-CM | POA: Diagnosis present

## 2020-01-17 DIAGNOSIS — A0839 Other viral enteritis: Secondary | ICD-10-CM | POA: Diagnosis not present

## 2020-01-17 DIAGNOSIS — N1832 Chronic kidney disease, stage 3b: Secondary | ICD-10-CM | POA: Diagnosis not present

## 2020-01-17 DIAGNOSIS — Z8249 Family history of ischemic heart disease and other diseases of the circulatory system: Secondary | ICD-10-CM

## 2020-01-17 DIAGNOSIS — K5730 Diverticulosis of large intestine without perforation or abscess without bleeding: Secondary | ICD-10-CM | POA: Diagnosis present

## 2020-01-17 DIAGNOSIS — I130 Hypertensive heart and chronic kidney disease with heart failure and stage 1 through stage 4 chronic kidney disease, or unspecified chronic kidney disease: Secondary | ICD-10-CM | POA: Diagnosis present

## 2020-01-17 DIAGNOSIS — Z882 Allergy status to sulfonamides status: Secondary | ICD-10-CM

## 2020-01-17 DIAGNOSIS — J449 Chronic obstructive pulmonary disease, unspecified: Secondary | ICD-10-CM | POA: Diagnosis present

## 2020-01-17 DIAGNOSIS — F17210 Nicotine dependence, cigarettes, uncomplicated: Secondary | ICD-10-CM | POA: Diagnosis present

## 2020-01-17 DIAGNOSIS — Z79899 Other long term (current) drug therapy: Secondary | ICD-10-CM | POA: Diagnosis not present

## 2020-01-17 DIAGNOSIS — Z7983 Long term (current) use of bisphosphonates: Secondary | ICD-10-CM

## 2020-01-17 DIAGNOSIS — Z7982 Long term (current) use of aspirin: Secondary | ICD-10-CM | POA: Diagnosis not present

## 2020-01-17 DIAGNOSIS — U071 COVID-19: Secondary | ICD-10-CM | POA: Diagnosis present

## 2020-01-17 DIAGNOSIS — E039 Hypothyroidism, unspecified: Secondary | ICD-10-CM | POA: Diagnosis present

## 2020-01-17 DIAGNOSIS — Z79890 Hormone replacement therapy (postmenopausal): Secondary | ICD-10-CM | POA: Diagnosis not present

## 2020-01-17 DIAGNOSIS — E876 Hypokalemia: Secondary | ICD-10-CM | POA: Diagnosis present

## 2020-01-17 DIAGNOSIS — E860 Dehydration: Secondary | ICD-10-CM | POA: Diagnosis present

## 2020-01-17 DIAGNOSIS — Z885 Allergy status to narcotic agent status: Secondary | ICD-10-CM

## 2020-01-17 DIAGNOSIS — Z880 Allergy status to penicillin: Secondary | ICD-10-CM

## 2020-01-17 DIAGNOSIS — Z720 Tobacco use: Secondary | ICD-10-CM | POA: Diagnosis not present

## 2020-01-17 DIAGNOSIS — K219 Gastro-esophageal reflux disease without esophagitis: Secondary | ICD-10-CM | POA: Diagnosis present

## 2020-01-17 DIAGNOSIS — K580 Irritable bowel syndrome with diarrhea: Secondary | ICD-10-CM | POA: Diagnosis present

## 2020-01-17 DIAGNOSIS — E871 Hypo-osmolality and hyponatremia: Secondary | ICD-10-CM | POA: Diagnosis present

## 2020-01-17 DIAGNOSIS — N179 Acute kidney failure, unspecified: Principal | ICD-10-CM | POA: Diagnosis present

## 2020-01-17 DIAGNOSIS — Z888 Allergy status to other drugs, medicaments and biological substances status: Secondary | ICD-10-CM

## 2020-01-17 DIAGNOSIS — Z825 Family history of asthma and other chronic lower respiratory diseases: Secondary | ICD-10-CM

## 2020-01-17 LAB — CBC
HCT: 40.2 % (ref 36.0–46.0)
Hemoglobin: 13.7 g/dL (ref 12.0–15.0)
MCH: 32 pg (ref 26.0–34.0)
MCHC: 34.1 g/dL (ref 30.0–36.0)
MCV: 93.9 fL (ref 80.0–100.0)
Platelets: 389 10*3/uL (ref 150–400)
RBC: 4.28 MIL/uL (ref 3.87–5.11)
RDW: 13.7 % (ref 11.5–15.5)
WBC: 7.3 10*3/uL (ref 4.0–10.5)
nRBC: 0 % (ref 0.0–0.2)

## 2020-01-17 LAB — COMPREHENSIVE METABOLIC PANEL
ALT: 10 U/L (ref 0–44)
AST: 15 U/L (ref 15–41)
Albumin: 4 g/dL (ref 3.5–5.0)
Alkaline Phosphatase: 70 U/L (ref 38–126)
Anion gap: 12 (ref 5–15)
BUN: 41 mg/dL — ABNORMAL HIGH (ref 8–23)
CO2: 18 mmol/L — ABNORMAL LOW (ref 22–32)
Calcium: 8.2 mg/dL — ABNORMAL LOW (ref 8.9–10.3)
Chloride: 102 mmol/L (ref 98–111)
Creatinine, Ser: 4.51 mg/dL — ABNORMAL HIGH (ref 0.44–1.00)
GFR, Estimated: 10 mL/min — ABNORMAL LOW (ref >60)
Glucose, Bld: 114 mg/dL — ABNORMAL HIGH (ref 70–99)
Potassium: 2.6 mmol/L — CL (ref 3.5–5.1)
Sodium: 132 mmol/L — ABNORMAL LOW (ref 135–145)
Total Bilirubin: 0.4 mg/dL (ref 0.3–1.2)
Total Protein: 7.8 g/dL (ref 6.5–8.1)

## 2020-01-17 LAB — RESP PANEL BY RT-PCR (FLU A&B, COVID) ARPGX2
Influenza A by PCR: NEGATIVE
Influenza B by PCR: NEGATIVE
SARS Coronavirus 2 by RT PCR: POSITIVE — AB

## 2020-01-17 MED ORDER — ONDANSETRON HCL 4 MG PO TABS: 4.0000 mg | ORAL_TABLET | Freq: Four times a day (QID) | ORAL | Status: DC | PRN

## 2020-01-17 MED ORDER — MEMANTINE HCL 10 MG PO TABS
10.0000 mg | ORAL_TABLET | Freq: Two times a day (BID) | ORAL | Status: DC
  Administered 2020-01-17 – 2020-01-19 (×3): 10 mg via ORAL
  Filled 2020-01-17 (×4): qty 1

## 2020-01-17 MED ORDER — ACETAMINOPHEN 650 MG RE SUPP: 650.0000 mg | Freq: Four times a day (QID) | RECTAL | Status: DC | PRN

## 2020-01-17 MED ORDER — PYRIDOSTIGMINE BROMIDE 60 MG PO TABS
30.0000 mg | ORAL_TABLET | Freq: Every day | ORAL | Status: DC
  Administered 2020-01-17: 30 mg via ORAL
  Filled 2020-01-17 (×2): qty 1

## 2020-01-17 MED ORDER — POTASSIUM CHLORIDE 10 MEQ/100ML IV SOLN
10.0000 meq | Freq: Once | INTRAVENOUS | Status: AC
  Administered 2020-01-17: 10 meq via INTRAVENOUS
  Filled 2020-01-17: qty 100

## 2020-01-17 MED ORDER — ACETAMINOPHEN 325 MG PO TABS: 650.0000 mg | ORAL_TABLET | Freq: Four times a day (QID) | ORAL | Status: DC | PRN

## 2020-01-17 MED ORDER — ASPIRIN EC 81 MG PO TBEC
81.0000 mg | DELAYED_RELEASE_TABLET | Freq: Every day | ORAL | Status: DC
  Administered 2020-01-18 – 2020-01-19 (×2): 81 mg via ORAL
  Filled 2020-01-17 (×2): qty 1

## 2020-01-17 MED ORDER — TRAZODONE HCL 50 MG PO TABS
50.0000 mg | ORAL_TABLET | Freq: Every evening | ORAL | Status: DC | PRN
  Administered 2020-01-17: 50 mg via ORAL
  Filled 2020-01-17: qty 1

## 2020-01-17 MED ORDER — MAGNESIUM SULFATE 2 GM/50ML IV SOLN
2.0000 g | INTRAVENOUS | Status: AC
  Administered 2020-01-17: 2 g via INTRAVENOUS
  Filled 2020-01-17: qty 50

## 2020-01-17 MED ORDER — DONEPEZIL HCL 10 MG PO TABS: 10.0000 mg | ORAL_TABLET | Freq: Every day | ORAL | Status: DC

## 2020-01-17 MED ORDER — SODIUM CHLORIDE 0.9 % IV SOLN: INTRAVENOUS | Status: DC

## 2020-01-17 MED ORDER — LEVOTHYROXINE SODIUM 88 MCG PO TABS
88.0000 ug | ORAL_TABLET | ORAL | Status: DC
  Administered 2020-01-18 – 2020-01-19 (×2): 88 ug via ORAL
  Filled 2020-01-17 (×2): qty 1

## 2020-01-17 MED ORDER — ESCITALOPRAM OXALATE 10 MG PO TABS
10.0000 mg | ORAL_TABLET | Freq: Every day | ORAL | Status: DC
  Administered 2020-01-17 – 2020-01-18 (×2): 10 mg via ORAL
  Filled 2020-01-17 (×2): qty 1

## 2020-01-17 MED ORDER — POTASSIUM CHLORIDE 10 MEQ/100ML IV SOLN
10.0000 meq | INTRAVENOUS | Status: AC
  Administered 2020-01-17 (×4): 10 meq via INTRAVENOUS
  Filled 2020-01-17 (×4): qty 100

## 2020-01-17 MED ORDER — MELATONIN 3 MG PO TABS
9.0000 mg | ORAL_TABLET | Freq: Once | ORAL | Status: AC
  Administered 2020-01-17: 9 mg via ORAL
  Filled 2020-01-17: qty 3

## 2020-01-17 MED ORDER — MELATONIN 5 MG PO TABS
10.0000 mg | ORAL_TABLET | Freq: Once | ORAL | Status: DC
  Filled 2020-01-17: qty 2

## 2020-01-17 MED ORDER — HEPARIN SODIUM (PORCINE) 5000 UNIT/ML IJ SOLN
5000.0000 [IU] | Freq: Three times a day (TID) | INTRAMUSCULAR | Status: DC
  Administered 2020-01-17 – 2020-01-18 (×3): 5000 [IU] via SUBCUTANEOUS
  Filled 2020-01-17 (×5): qty 1

## 2020-01-17 MED ORDER — POTASSIUM CHLORIDE CRYS ER 20 MEQ PO TBCR
40.0000 meq | EXTENDED_RELEASE_TABLET | Freq: Once | ORAL | Status: AC
  Administered 2020-01-17: 40 meq via ORAL
  Filled 2020-01-17: qty 2

## 2020-01-17 MED ORDER — SODIUM CHLORIDE 0.9 % IV BOLUS
500.0000 mL | Freq: Once | INTRAVENOUS | Status: AC
  Administered 2020-01-17: 500 mL via INTRAVENOUS

## 2020-01-17 MED ORDER — SODIUM CHLORIDE 0.9 % IV SOLN: Freq: Once | INTRAVENOUS | Status: AC

## 2020-01-17 MED ORDER — ONDANSETRON HCL 4 MG/2ML IJ SOLN: 4.0000 mg | Freq: Four times a day (QID) | INTRAMUSCULAR | Status: DC | PRN

## 2020-01-17 NOTE — ED Provider Notes (Signed)
[facility] EMERGENCY DEPARTMENT Provider Note   CSN: [id number] Arrival date & time: 01/17/20  1155     History Chief Complaint  Patient presents with  . Dehydration    Melanie Bradley is a 69 y.o. female.  HPI   This patient is a very pleasant 69 year old female, she does have a history of dementia, the majority of the history is obtained from her husband.  The patient has recently been evaluated and diagnosed with COVID-19, she was admitted to the hospital on January 07, 2020, she was discharged the next day then seen again several days later with some low blood pressure after having a poor appetite and not eating or drinking very much.  Over the last several days she continues to have a very poor appetite eating very small amounts of food, drinking small amounts of liquid but really having no appetite.  The patient herself has no complaints and in fact the spouse states there has been no fevers or chills nausea or vomiting, the occasional small amount of diarrhea.  She was advised to come in for evaluation if the blood pressure was low and it was around 90 systolic this morning that the husband reports,  Level 5 caveat applies secondary to dementia  Past Medical History:  Diagnosis Date  . Atrial fibrillation ([place])   . Bloating   . Complication of anesthesia     " hard to wake up sometimes "  . Dementia ([place])   . Diverticula of colon   . Epigastric burning sensation   . GERD (gastroesophageal reflux disease)   . Headache disorder 06/03/2014  . Headache(784.0)   . Helicobacter pylori gastritis 2004  . Hemorrhoids 06/26/03   Dr. Laural Golden tcs  . Hiatal hernia 06/26/03   Dr. Laural Golden egd  . Hypertension   . Hypothyroidism   . 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 Memory difficulty 06/03/2014  . Shoulder fracture, left   . Spastic colon   . Wrist fracture     Patient Active Problem List   Diagnosis Date Noted  . Gastroenteritis due to COVID-19 virus  01/08/2020  . Hypotension 01/07/2020  . Acute renal failure superimposed on stage 3b chronic kidney disease ([place]) 01/07/2020  . History of diarrhea 11/18/2019  . Chronic systolic heart failure ([place])   . Cardiomyopathy ([place]) 10/28/2018  . AKI (acute kidney injury) ([place])   . Dehydration   . Hypokalemia   . Syncope and collapse   . Syncope 10/26/2018  . C. difficile enteritis 02/22/2018  . Sepsis ([place]) 02/21/2018  . Leukocytosis 02/21/2018  . Vomiting and diarrhea 02/21/2018  . Dementia without behavioral disturbance ([place]) 02/21/2018  . Hypothyroidism 02/21/2018  . Diarrhea 02/21/2018  . Wrist fracture 06/26/2016  . Humeral fracture 06/26/2016  . Encounter for screening colonoscopy 12/31/2015  . Memory difficulty 06/03/2014  . Headache disorder 06/03/2014  . Atrial flutter ([place]) 05/09/2013  . COPD (chronic obstructive pulmonary disease) ([place]) 05/09/2013  . Tobacco abuse 05/08/2013  . LBBB (left bundle branch block)- intermittent 05/08/2013  . Chronic diarrhea 05/11/2011  . Chronic nausea 05/11/2011  . Dyspepsia 02/24/2011  . IBS (irritable bowel syndrome) 02/24/2011  . Chest pain 12/05/2010  . Hypertension 12/05/2010    Past Surgical History:  Procedure Laterality Date  . APPENDECTOMY    . CHOLECYSTECTOMY    . COLONOSCOPY  06/26/03   Dr. Kem Boroughs diverticula at the sigmoid colon with one of the transverse colon, normal terminal ileoscopy, small external  hemorrhoids the  . COLONOSCOPY N/A 01/20/2016   sigmoid diverticulosis, otherwise normal.   . ESOPHAGOGASTRODUODENOSCOPY  03/15/2011   Dr. Trevor Iha hiatal hernia, abnormal gastric mucosa of unclear significance. Gastric biopsies negative, no H. pylori.Procedure: ESOPHAGOGASTRODUODENOSCOPY (EGD);  Surgeon: Daneil Dolin, MD;  Location[facility];  Service: Endoscopy;  Laterality: N/A;  7:30  . ORIF HUMERUS FRACTURE Left 05/29/2016   Procedure: LEFT OPEN REDUCTION INTERNAL FIXATION (ORIF) PROXIMAL HUMERUS FRACTURE;   Surgeon: Meredith Pel, MD;  Location: [place];  Service: Orthopedics;  Laterality: Left;  . ORIF WRIST FRACTURE Left 05/29/2016   Procedure: OPEN REDUCTION INTERNAL FIXATION (ORIF) LEFT WRIST FRACTURE;  Surgeon: Meredith Pel, MD;  Location: [place];  Service: Orthopedics;  Laterality: Left;  . RIGHT/LEFT HEART CATH AND CORONARY ANGIOGRAPHY N/A 04/17/2019   Procedure: RIGHT/LEFT HEART CATH AND CORONARY ANGIOGRAPHY;  Surgeon: Belva Crome, MD;  Location: [place] [facility];  Service: Cardiovascular;  Laterality: N/A;  . TUBAL LIGATION    . UPPER GASTROINTESTINAL ENDOSCOPY  06/26/03   Dr. Wynelle Bourgeois sliding hiatal hernia with 8 mm tongue of gastric type mucosa at distal esophagus bile in the stomach.     OB History   No obstetric history on file.     Family History  Problem Relation Age of Onset  . Hypertension Mother   . Angina Mother   . COPD Father   . Heart failure Father   . Cancer Sister   . Dementia Sister   . Cardiomyopathy Brother   . Pneumonia Other   . Migraines Sister   . Colon cancer Neg Hx     Social History   Tobacco Use  . Smoking status: Current Every Day Smoker    Packs/day: 1.00    Years: 45.00    Pack years: 45.00    Types: Cigarettes    Start date: 09/10/1966  . Smokeless tobacco: Never Used  . Tobacco comment: 12/21/16 1 PPD  Vaping Use  . Vaping Use: Never used  Substance Use Topics  . Alcohol use: No    Alcohol/week: 0.0 standard drinks  . Drug use: No    Home Medications Prior to Admission medications   Medication Sig Start Date End Date Taking? Authorizing Provider  acetaminophen (TYLENOL) 500 MG tablet Take 1,000 mg by mouth every 6 (six) hours as needed (headaches.).    [provider]  alendronate (FOSAMAX) 70 MG tablet Take 70 mg by mouth every Tuesday.  07/27/18   [provider]  aspirin EC 81 MG tablet Take 81 mg by mouth daily.    [provider]  Calcium Carb-Cholecalciferol (CALCIUM 600+D3 PO)  Take 1 tablet by mouth daily with supper.    [provider]  carvedilol (COREG) 12.5 MG tablet Take 1 tablet (12.5 mg total) by mouth 2 (two) times daily. 04/10/19 01/13/20  Arnoldo Lenis, MD  Cyanocobalamin (B-12 PO) Take 6,000 mcg by mouth daily.    [provider]  donepezil (ARICEPT) 10 MG tablet Take 1 tablet (10 mg total) by mouth at bedtime. 03/11/19   Ward Givens, NP  escitalopram (LEXAPRO) 10 MG tablet Take 10 mg by mouth daily with supper.  10/16/14   [provider]  memantine (NAMENDA) 10 MG tablet Take 1 tablet (10 mg total) by mouth 2 (two) times daily. 03/11/19   Ward Givens, NP  pyridostigmine (MESTINON) 60 MG tablet Take 30 mg by mouth at bedtime.     [provider]  sacubitril-valsartan Delene Loll) 24-26  MG TAKE (1) TABLET BY MOUTH TWICE DAILY. 01/05/20   Arnoldo Lenis, MD  Specialty Vitamins Products (BRAIN PO) Take 1 tablet by mouth daily. [employer] Supplement    [provider]  SYNTHROID 88 MCG tablet Take 88 mcg by mouth See admin instructions. Take 1 tablet (88 mcg) by mouth on Sundays, Mondays, Tuesdays, Wednesdays,Thursdays, & Saturdays at bedtime, DOES NOT TAKE ANY Fridays. 12/15/17   [provider]    Allergies    Penicillins, Benadryl [diphenhydramine hcl], Msm [methylsulfonylmethane], Nubain [nalbuphine hcl], Sulfa antibiotics, and Codeine  Review of Systems   Review of Systems  Unable to perform ROS: Dementia    Physical Exam Updated Vital Signs BP 91/73   Pulse 83   Temp 97.9 F (36.6 C) (Oral)   Resp 16   Ht 1.549 m (5\' 1" )   Wt 52.2 kg   SpO2 95%   BMI 21.73 kg/m   Physical Exam Vitals and nursing note reviewed.  Constitutional:      General: She is not in acute distress.    Appearance: She is well-developed.  HENT:     Head: Normocephalic and atraumatic.     Mouth/Throat:     Pharynx: No oropharyngeal exudate.     Comments: Mucous membranes appear normal and moist Eyes:      General: No scleral icterus.       Right eye: No discharge.        Left eye: No discharge.     Conjunctiva/sclera: Conjunctivae normal.     Pupils: Pupils are equal, round, and reactive to light.  Neck:     Thyroid: No thyromegaly.     Vascular: No JVD.  Cardiovascular:     Rate and Rhythm: Normal rate and regular rhythm.     Heart sounds: Normal heart sounds. No murmur heard.  No friction rub. No gallop.      Comments: Good pulses at the radial arteries bilaterally, good heart sounds, no murmurs, no Pulmonary:     Effort: Pulmonary effort is normal. No respiratory distress.     Breath sounds: Normal breath sounds. No wheezing or rales.     Comments: Lung sounds are clear Abdominal:     General: Bowel sounds are normal. There is no distension.     Palpations: Abdomen is soft. There is no mass.     Tenderness: There is no abdominal tenderness.  Musculoskeletal:        General: No tenderness. Normal range of motion.     Cervical back: Normal range of motion and neck supple.     Right lower leg: No edema.     Left lower leg: No edema.  Lymphadenopathy:     Cervical: No cervical adenopathy.  Skin:    General: Skin is warm and dry.     Findings: No erythema or rash.  Neurological:     Mental Status: She is alert.     Coordination: Coordination normal.  Psychiatric:        Behavior: Behavior normal.     ED Results / Procedures / Treatments   Labs (all labs ordered are listed, but only abnormal results are displayed) Labs Reviewed  COMPREHENSIVE METABOLIC PANEL - Abnormal; Notable for the following components:      Result Value   Sodium 132 (*)    Potassium 2.6 (*)    CO2 18 (*)    Glucose, Bld 114 (*)    BUN 41 (*)    Creatinine, Ser 4.51 (*)  Calcium 8.2 (*)    GFR, Estimated 10 (*)    All other components within normal limits  CBC    EKG None  Radiology No results found.  Procedures .Critical Care Performed by: Noemi Chapel, MD Authorized by:  Noemi Chapel, MD   Critical care provider statement:    Critical care time (minutes):  35   Critical care was necessary to treat or prevent imminent or life-threatening deterioration of the following conditions:  Renal failure and metabolic crisis   Critical care was time spent personally by me on the following activities:  Discussions with consultants, evaluation of patient's response to treatment, examination of patient, ordering and performing treatments and interventions, ordering and review of laboratory studies, ordering and review of radiographic studies, pulse oximetry, re-evaluation of patient's condition, obtaining history from patient or surrogate and review of old charts   (including critical care time)  Medications Ordered in ED Medications  magnesium sulfate IVPB 2 g 50 mL (has no administration in time range)  potassium chloride 10 mEq in 100 mL IVPB (has no administration in time range)  potassium chloride 10 mEq in 100 mL IVPB (10 mEq Intravenous New Bag/Given 01/17/20 1355)  sodium chloride 0.9 % bolus 500 mL (0 mLs Intravenous Stopped 01/17/20 1346)  0.9 %  sodium chloride infusion ( Intravenous New Bag/Given 01/17/20 1356)    ED Course  I have reviewed the triage vital signs and the nursing notes.  Pertinent labs & imaging results that were available during my care of the patient were reviewed by me and considered in my medical decision making (see chart for details).    MDM Rules/Calculators/A&P                          This patient appears to have at least historically some mild dehydration, her blood pressure here is over 161 systolic, she does not appear to be in any distress and states that she feels comfortable, she wants to go home.  I think it is reasonable to check electrolytes and give a small amount of IV fluid as she likely has some level of dehydration, she is agreeable, labs pending  Unfortunately the laboratory work-up shows that the patient has a  potassium of 2.6.  To treat this I gave the patient 2 g of magnesium and 2 rounds of potassium chloride, she will need to be admitted to the hospital for her severe hypokalemia along with this new acute kidney injury.  Her laboratory work-up also shows a creatinine of 4.5, this is new and significantly elevated from prior levels.  Her BUN is 41, CBC shows no leukocytosis or anemia.  IV fluids given to treat for the dehydration and likely prerenal cause of her symptoms  I discussed the case with Dr. Manuella Ghazi of the hospitalist service who has been kind enough to admit this patient.  Melanie Bradley was evaluated in Emergency Department on 01/17/2020 for the symptoms described in the history of present illness. She was evaluated in the context of the global COVID-19 pandemic, which necessitated consideration that the patient might be at risk for infection with the SARS-CoV-2 virus that causes COVID-19. Institutional protocols and algorithms that pertain to the evaluation of patients at risk for COVID-19 are in a state of rapid change based on information released by regulatory bodies including the CDC and federal and state organizations. These policies and algorithms were followed during the patient's care in the ED.  Final Clinical Impression(s) / ED Diagnoses Final diagnoses:  Acute renal failure, unspecified acute renal failure type ([place])  Hypokalemia      Noemi Chapel, MD 01/17/20 1413

## 2020-01-17 NOTE — ED Triage Notes (Signed)
Pt. Has not been able to eat or drink for a week. Pt. Husband states the pts. Blood pressure was 83/67.

## 2020-01-17 NOTE — Progress Notes (Signed)
Has been asking to go home nonstop.  Alert and oriented x 2 but very anxious and pulled iv thinking she was leaving.  Asks to go home constantly.  Husband is here and will spend the night and requested something to help her sleep. Sent chat to Dr. Manuella Ghazi for prn sleep med.

## 2020-01-17 NOTE — ED Notes (Signed)
Date and time results received: 01/17/20 & 1343  Test: Potassium Critical Value: 2.6  Name of Provider Notified: Noemi Chapel, MD   Orders Received? Or Actions Taken?: MD Notified

## 2020-01-17 NOTE — H&P (Signed)
History and Physical    Melanie Bradley ZCH:[id number] DOB: [birth year] DOA: 01/17/2020  PCP: Sharilyn Sites, MD   Patient coming from: Home  Chief Complaint: Progressive weakness with poor oral intake  HPI: Melanie Bradley is a 69 y.o. female with medical history significant for dementia, chronic systolic congestive heart failure with LVEF 35-40%, left bundle branch block, hypertension, hypothyroidism, and CKD stage IIIb who was recently diagnosed with COVID-19 infection.  She was discharged recently from the hospital on 11/25 after treatment for this and then was seen in the ED several days after with low blood pressures and poor appetite with poor intake.  She continues to have very poor appetite and eats very small amounts of food and drink.  According to the spouse, there have been no fevers, chills, nausea, vomiting, but there have been occasional small amounts of diarrhea.  Her husband noted that her blood pressures were low at around 90 systolic this morning which prompted the ED visit.  The patient herself does not appear to have any complaints and constantly voices that she would like to go home.   ED Course: Vital signs stable and patient is afebrile.  Potassium is noted to be 2.6 and creatinine is 4.5 with baseline usually around 1.1-1.4.  BUN is also elevated at 41 and sodium is 132.  She appears dry and has been started on some IV fluid as well as some potassium supplementation.  Review of Systems: Difficult to obtain given patient condition.  Past Medical History:  Diagnosis Date  . Atrial fibrillation ([place])   . Bloating   . Complication of anesthesia     " hard to wake up sometimes "  . Dementia ([place])   . Diverticula of colon   . Epigastric burning sensation   . GERD (gastroesophageal reflux disease)   . Headache disorder 06/03/2014  . Headache(784.0)   . Helicobacter pylori gastritis 2004  . Hemorrhoids 06/26/03   Dr. Laural Golden tcs  . Hiatal hernia 06/26/03   Dr. Laural Golden  egd  . Hypertension   . Hypothyroidism   . 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 Memory difficulty 06/03/2014  . Shoulder fracture, left   . Spastic colon   . Wrist fracture     Past Surgical History:  Procedure Laterality Date  . APPENDECTOMY    . CHOLECYSTECTOMY    . COLONOSCOPY  06/26/03   Dr. Kem Boroughs diverticula at the sigmoid colon with one of the transverse colon, normal terminal ileoscopy, small external hemorrhoids the  . COLONOSCOPY N/A 01/20/2016   sigmoid diverticulosis, otherwise normal.   . ESOPHAGOGASTRODUODENOSCOPY  03/15/2011   Dr. Trevor Iha hiatal hernia, abnormal gastric mucosa of unclear significance. Gastric biopsies negative, no H. pylori.Procedure: ESOPHAGOGASTRODUODENOSCOPY (EGD);  Surgeon: Daneil Dolin, MD;  [facility];  Service: Endoscopy;  Laterality: N/A;  7:30  . ORIF HUMERUS FRACTURE Left 05/29/2016   Procedure: LEFT OPEN REDUCTION INTERNAL FIXATION (ORIF) PROXIMAL HUMERUS FRACTURE;  Surgeon: Meredith Pel, MD;  Location: [facility];  Service: Orthopedics;  Laterality: Left;  . ORIF WRIST FRACTURE Left 05/29/2016   Procedure: OPEN REDUCTION INTERNAL FIXATION (ORIF) LEFT WRIST FRACTURE;  Surgeon: Meredith Pel, MD;  Location: [facility];  Service: Orthopedics;  Laterality: Left;  . RIGHT/LEFT HEART CATH AND CORONARY ANGIOGRAPHY N/A 04/17/2019   Procedure: RIGHT/LEFT HEART CATH AND CORONARY ANGIOGRAPHY;  Surgeon: Belva Crome, MD;  Location: [facility];  Service: Cardiovascular;  Laterality: N/A;  .  TUBAL LIGATION    . UPPER GASTROINTESTINAL ENDOSCOPY  06/26/03   Dr. Wynelle Bourgeois sliding hiatal hernia with 8 mm tongue of gastric type mucosa at distal esophagus bile in the stomach.     reports that she has been smoking cigarettes. She started smoking about 53 years ago. She has a 45.00 pack-year smoking history. She has never used smokeless tobacco. She reports that she does not drink alcohol and does not use  drugs.  Allergies  Allergen Reactions  . Penicillins Hives and Swelling    Has patient had a PCN reaction causing IMMEDIATE RASH, FACIAL/TONGUE/THROAT SWELLING, SOB, OR LIGHTHEADEDNESS WITH HYPOTENSION:  #  #  #  YES  #  #  #  Has patient had a PCN reaction causing severe rash involving mucus membranes or skin necrosis: No Has patient had a PCN reaction that required hospitalization No Has patient had a PCN reaction occurring within the last 10 years: No If all of the above answers are "NO", then may proceed with Cephalosporin use.   . Benadryl [Diphenhydramine Hcl] Hives  . Msm [Methylsulfonylmethane] Hives  . Nubain [Nalbuphine Hcl] Hives  . Sulfa Antibiotics Hives  . Codeine     UNSPECIFIED REACTION [IF AT ALL] REFUSES TO TAKE    Family History  Problem Relation Age of Onset  . Hypertension Mother   . Angina Mother   . COPD Father   . Heart failure Father   . Cancer Sister   . Dementia Sister   . Cardiomyopathy Brother   . Pneumonia Other   . Migraines Sister   . Colon cancer Neg Hx     Prior to Admission medications   Medication Sig Start Date End Date Taking? Authorizing Provider  acetaminophen (TYLENOL) 500 MG tablet Take 1,000 mg by mouth every 6 (six) hours as needed (headaches.).    [provider]  alendronate (FOSAMAX) 70 MG tablet Take 70 mg by mouth every Tuesday.  07/27/18   [provider]  aspirin EC 81 MG tablet Take 81 mg by mouth daily.    [provider]  Calcium Carb-Cholecalciferol (CALCIUM 600+D3 PO) Take 1 tablet by mouth daily with supper.    [provider]  carvedilol (COREG) 12.5 MG tablet Take 1 tablet (12.5 mg total) by mouth 2 (two) times daily. 04/10/19 01/13/20  Arnoldo Lenis, MD  Cyanocobalamin (B-12 PO) Take 6,000 mcg by mouth daily.    [provider]  donepezil (ARICEPT) 10 MG tablet Take 1 tablet (10 mg total) by mouth at bedtime. 03/11/19   Ward Givens, NP  escitalopram (LEXAPRO) 10 MG  tablet Take 10 mg by mouth daily with supper.  10/16/14   [provider]  memantine (NAMENDA) 10 MG tablet Take 1 tablet (10 mg total) by mouth 2 (two) times daily. 03/11/19   Ward Givens, NP  pyridostigmine (MESTINON) 60 MG tablet Take 30 mg by mouth at bedtime.     [provider]  sacubitril-valsartan (ENTRESTO) 24-26 MG TAKE (1) TABLET BY MOUTH TWICE DAILY. 01/05/20   Arnoldo Lenis, MD  Specialty Vitamins Products (BRAIN PO) Take 1 tablet by mouth daily. CogniSense Supplement    [provider]  SYNTHROID 88 MCG tablet Take 88 mcg by mouth See admin instructions. Take 1 tablet (88 mcg) by mouth on Sundays, Mondays, Tuesdays, Wednesdays,Thursdays, & Saturdays at bedtime, DOES NOT TAKE ANY Fridays. 12/15/17   [provider]    Physical Exam: Vitals:   01/17/20 1315 01/17/20 1330 01/17/20 1345  01/17/20 1400  BP:  115/88  91/73  Pulse: 62 63 80 83  Resp: 11 13 13 16   Temp:      TempSrc:      SpO2: 97% 96% 100% 95%  Weight:      Height:        Constitutional: NAD, calm, comfortable Vitals:   01/17/20 1315 01/17/20 1330 01/17/20 1345 01/17/20 1400  BP:  115/88  91/73  Pulse: 62 63 80 83  Resp: 11 13 13 16   Temp:      TempSrc:      SpO2: 97% 96% 100% 95%  Weight:      Height:       Eyes: lids and conjunctivae normal, wears glasses Neck: normal, supple Respiratory: clear to auscultation bilaterally. Normal respiratory effort. No accessory muscle use.  Currently on room air. Cardiovascular: Regular rate and rhythm, no murmurs. No extremity edema. Abdomen: no tenderness, no distention. Bowel sounds positive.  Musculoskeletal:  No joint deformity upper and lower extremities.   Skin: no rashes, lesions, ulcers.  Psychiatric: Difficult to assess  Labs on Admission: I have personally reviewed following labs and imaging studies  CBC: Recent Labs  Lab 01/11/20 0932 01/17/20 1244  WBC 9.2 7.3  NEUTROABS 7.2  --   HGB 12.2 13.7  HCT  35.9* 40.2  MCV 94.0 93.9  PLT 290 696   Basic Metabolic Panel: Recent Labs  Lab 01/11/20 0932 01/17/20 1244  NA 125* 132*  K 3.6 2.6*  CL 98 102  CO2 18* 18*  GLUCOSE 108* 114*  BUN 16 41*  CREATININE 1.79* 4.51*  CALCIUM 8.5* 8.2*   GFR: Estimated Creatinine Clearance: 9.1 mL/min (A) (by C-G formula based on SCr of 4.51 mg/dL (H)). Liver Function Tests: Recent Labs  Lab 01/11/20 0932 01/17/20 1244  AST 28 15  ALT 13 10  ALKPHOS 50 70  BILITOT 0.5 0.4  PROT 7.0 7.8  ALBUMIN 3.5 4.0   No results for input(s): LIPASE, AMYLASE in the last 168 hours. No results for input(s): AMMONIA in the last 168 hours. Coagulation Profile: No results for input(s): INR, PROTIME in the last 168 hours. Cardiac Enzymes: No results for input(s): CKTOTAL, CKMB, CKMBINDEX, TROPONINI in the last 168 hours. BNP (last 3 results) No results for input(s): PROBNP in the last 8760 hours. HbA1C: No results for input(s): HGBA1C in the last 72 hours. CBG: No results for input(s): GLUCAP in the last 168 hours. Lipid Profile: No results for input(s): CHOL, HDL, LDLCALC, TRIG, CHOLHDL, LDLDIRECT in the last 72 hours. Thyroid Function Tests: No results for input(s): TSH, T4TOTAL, FREET4, T3FREE, THYROIDAB in the last 72 hours. Anemia Panel: No results for input(s): VITAMINB12, FOLATE, FERRITIN, TIBC, IRON, RETICCTPCT in the last 72 hours. Urine analysis:    Component Value Date/Time   COLORURINE YELLOW 01/11/2020 1113   APPEARANCEUR HAZY (A) 01/11/2020 1113   LABSPEC 1.017 01/11/2020 1113   PHURINE 5.0 01/11/2020 1113   GLUCOSEU NEGATIVE 01/11/2020 1113   HGBUR NEGATIVE 01/11/2020 1113   BILIRUBINUR NEGATIVE 01/11/2020 1113   KETONESUR NEGATIVE 01/11/2020 1113   PROTEINUR 30 (A) 01/11/2020 1113   NITRITE NEGATIVE 01/11/2020 1113   LEUKOCYTESUR NEGATIVE 01/11/2020 1113    Radiological Exams on Admission: No results found.    Assessment/Plan Active Problems:   AKI (acute kidney  injury) ([place])    AKI on CKD stage IIIb-likely prerenal -In setting of poor oral intake -Baseline creatinine 1.1-1.4 -Avoid nephrotoxic agents -Monitor strict I's and O's and plan  to consult nephrology if oliguric -Continue aggressive IV fluids for hydration -Recheck a.m. labs  Hypokalemia -Likely related to poor oral intake in the setting of ongoing mild diarrhea -Replete IV and p.o. as tolerated -Continue to monitor closely -Monitor on telemetry  Ongoing diarrhea in the setting of COVID-19 enteritis -Patient has finished isolation with no further need for this necessary unless mandated by hospital -Repeat test pending -In the setting of IBS -Very likely contributing to hypokalemia  Mild hyponatremia -Related to volume depletion -Continue IV normal saline and monitor  Chronic systolic CHF -2D echocardiogram with LVEF 35-40% with indeterminate diastolic function noted on 02/2019 -Currently appears dry -Plan to hold Entresto and carvedilol in the setting of hypotension -Monitor daily weights with IV fluid administration  Hypothyroidism -Continue Synthroid  Dementia -Continue Aricept and Namenda -Monitor for behavioral disturbances  Ongoing tobacco abuse -Counseled on cessation  DVT prophylaxis: Heparin Code Status: Full Family Communication: Discussed with husband at bedside. Disposition Plan:Admit for IVF hydration and treatment of hypokalemia Consults called:None Admission status: Inpatient, [facility]  If 7PM-7AM, please contact night-coverage [email or web address]  01/17/2020, 2:07 PM

## 2020-01-18 ENCOUNTER — Encounter (HOSPITAL_COMMUNITY): Payer: Self-pay | Admitting: Internal Medicine

## 2020-01-18 DIAGNOSIS — A0839 Other viral enteritis: Secondary | ICD-10-CM

## 2020-01-18 DIAGNOSIS — F0390 Unspecified dementia without behavioral disturbance: Secondary | ICD-10-CM | POA: Diagnosis not present

## 2020-01-18 DIAGNOSIS — N1832 Chronic kidney disease, stage 3b: Secondary | ICD-10-CM

## 2020-01-18 DIAGNOSIS — E876 Hypokalemia: Secondary | ICD-10-CM | POA: Diagnosis not present

## 2020-01-18 DIAGNOSIS — Z720 Tobacco use: Secondary | ICD-10-CM

## 2020-01-18 DIAGNOSIS — U071 COVID-19: Secondary | ICD-10-CM | POA: Diagnosis not present

## 2020-01-18 DIAGNOSIS — N179 Acute kidney failure, unspecified: Secondary | ICD-10-CM | POA: Diagnosis not present

## 2020-01-18 LAB — BASIC METABOLIC PANEL
Anion gap: 7 (ref 5–15)
BUN: 36 mg/dL — ABNORMAL HIGH (ref 8–23)
CO2: 18 mmol/L — ABNORMAL LOW (ref 22–32)
Calcium: 7.7 mg/dL — ABNORMAL LOW (ref 8.9–10.3)
Chloride: 108 mmol/L (ref 98–111)
Creatinine, Ser: 3.01 mg/dL — ABNORMAL HIGH (ref 0.44–1.00)
GFR, Estimated: 16 mL/min — ABNORMAL LOW (ref >60)
Glucose, Bld: 106 mg/dL — ABNORMAL HIGH (ref 70–99)
Potassium: 3.3 mmol/L — ABNORMAL LOW (ref 3.5–5.1)
Sodium: 133 mmol/L — ABNORMAL LOW (ref 135–145)

## 2020-01-18 LAB — CBC
HCT: 34.6 % — ABNORMAL LOW (ref 36.0–46.0)
Hemoglobin: 11.2 g/dL — ABNORMAL LOW (ref 12.0–15.0)
MCH: 31.3 pg (ref 26.0–34.0)
MCHC: 32.4 g/dL (ref 30.0–36.0)
MCV: 96.6 fL (ref 80.0–100.0)
Platelets: 333 10*3/uL (ref 150–400)
RBC: 3.58 MIL/uL — ABNORMAL LOW (ref 3.87–5.11)
RDW: 13.6 % (ref 11.5–15.5)
WBC: 7.2 10*3/uL (ref 4.0–10.5)
nRBC: 0 % (ref 0.0–0.2)

## 2020-01-18 LAB — GLUCOSE, CAPILLARY: Glucose-Capillary: 97 mg/dL (ref 70–99)

## 2020-01-18 LAB — MAGNESIUM: Magnesium: 2.1 mg/dL (ref 1.7–2.4)

## 2020-01-18 MED ORDER — HALOPERIDOL LACTATE 5 MG/ML IJ SOLN
5.0000 mg | Freq: Four times a day (QID) | INTRAMUSCULAR | Status: DC | PRN
  Administered 2020-01-18: 5 mg via INTRAVENOUS
  Filled 2020-01-18: qty 1

## 2020-01-18 MED ORDER — LORAZEPAM 2 MG/ML IJ SOLN: 0.5000 mg | Freq: Every evening | INTRAMUSCULAR | Status: DC | PRN

## 2020-01-18 MED ORDER — NICOTINE 21 MG/24HR TD PT24
21.0000 mg | MEDICATED_PATCH | Freq: Every day | TRANSDERMAL | Status: DC
  Administered 2020-01-18 – 2020-01-19 (×2): 21 mg via TRANSDERMAL
  Filled 2020-01-18 (×2): qty 1

## 2020-01-18 MED ORDER — POTASSIUM CHLORIDE IN NACL 20-0.9 MEQ/L-% IV SOLN: INTRAVENOUS | Status: DC

## 2020-01-18 MED ORDER — LORAZEPAM 2 MG/ML IJ SOLN
0.5000 mg | Freq: Four times a day (QID) | INTRAMUSCULAR | Status: DC | PRN
  Administered 2020-01-18: 0.5 mg via INTRAVENOUS
  Filled 2020-01-18: qty 1

## 2020-01-18 MED ORDER — POTASSIUM CHLORIDE CRYS ER 20 MEQ PO TBCR
40.0000 meq | EXTENDED_RELEASE_TABLET | Freq: Once | ORAL | Status: AC
  Administered 2020-01-18: 40 meq via ORAL
  Filled 2020-01-18: qty 2

## 2020-01-18 NOTE — Progress Notes (Signed)
Has been constantly asking to go home today with husband at bedside.  Received order for ativan due to has trying to get up and pull at IV line.  Smokes 1 pack a day and received order for nicoderm patch and for agitation,  ativan.  Ativan not effective and received order for haldol.

## 2020-01-18 NOTE — Progress Notes (Signed)
PROGRESS NOTE  TOMECA HELM HQI:[id number] DOB: 1950-09-07 DOA: 01/17/2020 PCP: Sharilyn Sites, MD  Brief History:  69 y.o. female with medical history of sCHF EF 35-40%, dementia, COPD, CKD 3b hypertension, IBS, remote atrial flutter, hypothyroidism and recent COVID 19 infection presenting with low BP.  Notably, patient was recently admitted to the hospital from 01/07/2020 to 01/08/2020 with COVID-19 enteritis and acute on chronic renal failure.  She remained stable on room air without any pulmonary symptoms.  She was rehydrated with improvement of her renal function.  She was discharged in stable condition with SBP 110-120s.  She was instructed to hold her Entresto and carvedilol for an additional day after discharge.  She presented back to the emergency department on 01/11/2020 secondary to low blood pressures once again with elevated serum creatinine.  Her serum creatinine was noted to be 1.79 11/28/202.  she was given 2 L in the emergency department and discharged home in stable condition.  Unfortunately, the patient continued to have poor oral intake.  She did follow-up with her cardiologist, Dr. Harl Bowie with instructions to continue holding her Entresto and carvedilol.   According to the spouse, there have been no fevers, chills, nausea, vomiting, but there have been occasional small amounts of diarrhea Unfortunately, the patient's blood pressures remain low with SBP 90.  As result, the patient was brought to emergency department for further evaluation.  ED Course: Vital signs stable and patient is afebrile.  Potassium is noted to be 2.6 and creatinine is 4.5 with baseline usually around 1.1-1.4.  BUN is also elevated at 41 and sodium is 132.  She appears dry and has been started on some IV fluid as well as some potassium supplementation.  Assessment/Plan: Acute on chronic renal failure--CKD stage IIIb -Baseline creatinine 1.1-1.4 -Presented with serum creatinine 4.51 -Secondary to  volume depletion -Holding Entresto secondary to elevated serum creatinine  Hypokalemia -Repleted -mag--2.1  Hyponatremia -due to volume depletion -improved with IVF  Ongoing diarrhea in the setting of COVID-19 enteritis -Patient has finished isolation with no further need for this necessary unless mandated by hospital -Repeat test pending -In the setting of IBS -Very likely contributing to hypokalemia  Chronic systolic CHF -1/32/4401 echo EF 35-40%, indeterminate diastolic function, global HK, mild to moderate MR -Clinically euvolemic presently -Holding carvedilol  -Holding Entresto   Hypothyroidism -Continue Synthroid  Dementia without behavioral disturbance -Continue Aricept and Namenda  Tobacco abuse I have discussed tobacco cessation with the patient. I have counseled the patient regarding the negative impacts of continued tobacco use including but not limited to lung cancer, COPD, and cardiovascular disease. I have discussed alternatives to tobacco and modalities that may help facilitate tobacco cessation including but not limited to biofeedback, hypnosis, and medications. Total time spent with tobacco counseling was 4 minutes.   Status is: Inpatient  Remains inpatient appropriate because:IV treatments appropriate due to intensity of illness or inability to take PO   Dispo: The patient is from: Home              Anticipated d/c is to: Home              Anticipated d/c date is: 1 day              Patient currently not medically stable for d/c        Family Communication:   Spouse updated at bedside 12/5  Consultants:  none  Code Status:  FULL  DVT Prophylaxis:  [place] Heparin   Procedures: As Listed in Progress Note Above  Antibiotics: None       Subjective: Patient denies fevers, chills, headache, chest pain, dyspnea, nausea, vomiting, diarrhea, abdominal pain, dysuria, hematuria, hematochezia, and melena.   Objective: Vitals:    01/17/20 1430 01/17/20 1541 01/17/20 1952 01/17/20 2024  BP: 98/81 104/84 (!) 115/93   Pulse: 70  89   Resp: 16 18 18    Temp:  97.9 F (36.6 C) (!) 97.5 F (36.4 C)   TempSrc:  Oral Oral   SpO2: 100%  98% 98%  Weight:      Height:        Intake/Output Summary (Last 24 hours) at 01/18/2020 1137 Last data filed at 01/18/2020 0900 Gross per 24 hour  Intake 2423.5 ml  Output --  Net 2423.5 ml   Weight change:  Exam:   General:  Pt is alert, follows commands appropriately, not in acute distress  HEENT: No icterus, No thrush, No neck mass, [place]/AT  Cardiovascular: RRR, S1/S2, no rubs, no gallops  Respiratory: diminished BS,  Bibasilar rales.  Abdomen: Soft/+BS, non tender, non distended, no guarding  Extremities: No edema, No lymphangitis, No petechiae, No rashes, no synovitis   Data Reviewed: I have personally reviewed following labs and imaging studies Basic Metabolic Panel: Recent Labs  Lab 01/17/20 1244 01/18/20 0744  NA 132* 133*  K 2.6* 3.3*  CL 102 108  CO2 18* 18*  GLUCOSE 114* 106*  BUN 41* 36*  CREATININE 4.51* 3.01*  CALCIUM 8.2* 7.7*  MG  --  2.1   Liver Function Tests: Recent Labs  Lab 01/17/20 1244  AST 15  ALT 10  ALKPHOS 70  BILITOT 0.4  PROT 7.8  ALBUMIN 4.0   No results for input(s): LIPASE, AMYLASE in the last 168 hours. No results for input(s): AMMONIA in the last 168 hours. Coagulation Profile: No results for input(s): INR, PROTIME in the last 168 hours. CBC: Recent Labs  Lab 01/17/20 1244 01/18/20 0744  WBC 7.3 7.2  HGB 13.7 11.2*  HCT 40.2 34.6*  MCV 93.9 96.6  PLT 389 333   Cardiac Enzymes: No results for input(s): CKTOTAL, CKMB, CKMBINDEX, TROPONINI in the last 168 hours. BNP: Invalid input(s): POCBNP CBG: No results for input(s): GLUCAP in the last 168 hours. HbA1C: No results for input(s): HGBA1C in the last 72 hours. Urine analysis:    Component Value Date/Time   COLORURINE YELLOW 01/11/2020 1113    APPEARANCEUR HAZY (A) 01/11/2020 1113   LABSPEC 1.017 01/11/2020 1113   PHURINE 5.0 01/11/2020 1113   GLUCOSEU NEGATIVE 01/11/2020 1113   HGBUR NEGATIVE 01/11/2020 1113   [place] 01/11/2020 1113   KETONESUR NEGATIVE 01/11/2020 1113   PROTEINUR 30 (A) 01/11/2020 1113   NITRITE NEGATIVE 01/11/2020 1113   LEUKOCYTESUR NEGATIVE 01/11/2020 1113   Sepsis Labs: @LABRCNTIP (procalcitonin:4,lacticidven:4) ) Recent Results (from the past 240 hour(s))  Blood culture (routine single)     Status: None   Collection Time: 01/11/20  9:32 AM   Specimen: BLOOD RIGHT HAND  Result Value Ref Range Status   Specimen Description   Final    BLOOD RIGHT HAND BOTTLES DRAWN AEROBIC AND ANAEROBIC   Special Requests Blood Culture adequate volume  Final   Culture   Final    NO GROWTH 5 DAYS Performed at [facility], [address], [place], [place] [address]    Report Status 01/16/2020 FINAL  Final  Urine culture     Status:  Abnormal   Collection Time: 01/11/20 11:13 AM   Specimen: Urine, Catheterized  Result Value Ref Range Status   Specimen Description   Final    URINE, CATHETERIZED Performed at [facility], [address] [place], [place] [address]    Special Requests   Final    NONE Performed at [facility], [address] [place], [place] [address]    Culture (A)  Final    80,000 COLONIES/mL LACTOBACILLUS SPECIES Standardized susceptibility testing for this organism is not available. Performed at [facility], [place] [address], [place], [place] [address]    Report Status 01/13/2020 FINAL  Final  Resp Panel by RT-PCR (Flu A&B, Covid) Nasopharyngeal Swab     Status: Abnormal   Collection Time: 01/17/20  2:25 PM   Specimen: Nasopharyngeal Swab; Nasopharyngeal(NP) swabs in vial transport medium  Result Value Ref Range Status   SARS Coronavirus 2 by RT PCR POSITIVE (A) NEGATIVE Final    Comment: RESULT CALLED TO, READ BACK BY AND VERIFIED WITH: BLEVINS @ [id number] ON [id number] BY  HENDERSON L (NOTE) SARS-CoV-2 target nucleic acids are DETECTED.  The SARS-CoV-2 RNA is generally detectable in upper respiratory specimens during the acute phase of infection. Positive results are indicative of the presence of the identified virus, but do not rule out bacterial infection or co-infection with other pathogens not detected by the test. Clinical correlation with patient history and other diagnostic information is necessary to determine patient infection status. The expected result is Negative.  Fact Sheet for Patients: EntrepreneurPulse.com.au  Fact Sheet for Healthcare Providers: IncredibleEmployment.be  This test is not yet approved or cleared by the [place] FDA and  has been authorized for detection and/or diagnosis of SARS-CoV-2 by FDA under an Emergency Use Authorization (EUA).  This EUA will remain in effect (meaning this test  can be used) for the duration of  the COVID-19 declaration under Section 564(b)(1) of the Act, 21 U.S.C. section 360bbb-3(b)(1), unless the authorization is terminated or revoked sooner.     Influenza A by PCR NEGATIVE NEGATIVE Final   Influenza B by PCR NEGATIVE NEGATIVE Final    Comment: (NOTE) The Xpert Xpress SARS-CoV-2/FLU/RSV plus assay is intended as an aid in the diagnosis of influenza from Nasopharyngeal swab specimens and should not be used as a sole basis for treatment. Nasal washings and aspirates are unacceptable for Xpert Xpress SARS-CoV-2/FLU/RSV testing.  Fact Sheet for Patients: EntrepreneurPulse.com.au  Fact Sheet for Healthcare Providers: IncredibleEmployment.be  This test is not yet approved or cleared by the [place] FDA and has been authorized for detection and/or diagnosis of SARS-CoV-2 by FDA under an Emergency Use Authorization (EUA). This EUA will remain in effect (meaning this test can be used) for the duration of  the COVID-19 declaration under Section 564(b)(1) of the Act, 21 U.S.C. section 360bbb-3(b)(1), unless the authorization is terminated or revoked.  Performed at [facility], [address] [place], [place] [address]      Scheduled Meds: . aspirin EC  81 mg Oral Daily  . escitalopram  10 mg Oral Q supper  . heparin  5,000 Units Subcutaneous Q8H  . levothyroxine  88 mcg Oral Once per day on Sun Mon Tue Wed Thu Sat  . memantine  10 mg Oral BID  . pyridostigmine  30 mg Oral QHS   Continuous Infusions: . sodium chloride 125 mL/hr at 01/18/20 3762    Procedures/Studies: DG Chest Port 1 View  Result Date: 01/11/2020 CLINICAL DATA:  Questionable sepsis.  EXAM: PORTABLE CHEST 1 VIEW COMPARISON:  January 07, 2020. FINDINGS: The heart size and mediastinal contours are within normal limits. Both lungs are clear. No visible pleural effusions or pneumothorax. No acute osseous abnormality. Partially imaged left humeral ORIF hardware. Cholecystectomy clips. IMPRESSION: No acute cardiopulmonary disease. Electronically Signed   By: Margaretha Sheffield MD   On: 01/11/2020 10:01   DG Chest Port 1 View  Result Date: 01/07/2020 CLINICAL DATA:  Weakness, diarrhea EXAM: PORTABLE CHEST 1 VIEW COMPARISON:  02/21/2018 FINDINGS: The heart size and mediastinal contours are stable. No focal airspace consolidation, pleural effusion, or pneumothorax. Partially visualized proximal left humeral ORIF hardware. IMPRESSION: No acute cardiopulmonary findings. Electronically Signed   By: Davina Poke D.O.   On: 01/07/2020 12:08    Orson Eva, DO  [facility]  If 7PM-7AM, please contact night-coverage [email or web address] Password [email or web address] 01/18/2020, 11:37 AM   LOS: 1 day

## 2020-01-18 NOTE — Progress Notes (Addendum)
MD notified of husband request to not wake the pt for medications or procedures. Husband stated the pt has not slept in two days and is only resting after ativan and haldol administration.

## 2020-01-19 DIAGNOSIS — F0390 Unspecified dementia without behavioral disturbance: Secondary | ICD-10-CM | POA: Diagnosis not present

## 2020-01-19 DIAGNOSIS — E876 Hypokalemia: Secondary | ICD-10-CM | POA: Diagnosis not present

## 2020-01-19 DIAGNOSIS — N179 Acute kidney failure, unspecified: Secondary | ICD-10-CM | POA: Diagnosis not present

## 2020-01-19 DIAGNOSIS — U071 COVID-19: Secondary | ICD-10-CM | POA: Diagnosis not present

## 2020-01-19 LAB — BASIC METABOLIC PANEL
Anion gap: 4 — ABNORMAL LOW (ref 5–15)
BUN: 22 mg/dL (ref 8–23)
CO2: 16 mmol/L — ABNORMAL LOW (ref 22–32)
Calcium: 7.6 mg/dL — ABNORMAL LOW (ref 8.9–10.3)
Chloride: 120 mmol/L — ABNORMAL HIGH (ref 98–111)
Creatinine, Ser: 1.8 mg/dL — ABNORMAL HIGH (ref 0.44–1.00)
GFR, Estimated: 30 mL/min — ABNORMAL LOW (ref >60)
Glucose, Bld: 106 mg/dL — ABNORMAL HIGH (ref 70–99)
Potassium: 4.4 mmol/L (ref 3.5–5.1)
Sodium: 140 mmol/L (ref 135–145)

## 2020-01-19 LAB — MAGNESIUM: Magnesium: 1.8 mg/dL (ref 1.7–2.4)

## 2020-01-19 NOTE — Progress Notes (Signed)
Pt discharged via [place] to POV in care of her husband with all personal belongings in her and husband's possession. Husband stated understanding of discharge instructions given.

## 2020-01-19 NOTE — Progress Notes (Signed)
Pt alert and cooperative this am,oriented to self only. Denies any complaints. States, "I slept good, I'm ready to go home." Ate 25% of breakfast.  Pt cooperative with lab draw this am. Husband remains at bedside, awaiting lab results and hoping that pt can be discharged home today.

## 2020-01-19 NOTE — Discharge Summary (Signed)
Physician Discharge Summary  Melanie Bradley KDX:[id number] DOB: [birth year] DOA: 01/17/2020  PCP: Sharilyn Sites, MD  Admit date: 01/17/2020 Discharge date: 01/19/2020  Admitted From: Home Disposition:  Home   Recommendations for Outpatient Follow-up:  1. Follow up with PCP in 1-2 weeks 2. Please obtain BMP/CBC in one week     Discharge Condition: Stable CODE STATUS: FULL Diet recommendation: Heart Healthy    Brief/Interim Summary: Acute on chronic renal failure--CKD stage IIIb -Baseline creatinine 1.1-1.4 -Presented with serum creatinine 4.51 -Secondary to volume depletion -Holding Entresto secondary to elevated serum creatinine--follow up with Dr. Harl Bowie (cards) before restarting -improved with IVF -serum creatinine 1.80 on day of d/c -due to patient's dementia and agitation, benefit of going home outweighs staying longer for IVF as pt is clinically improved and able to tolerate po intake -outpt follow up for BMP within one week  Hypokalemia -Repleted -mag--2.1  Hyponatremia -due to volume depletion -improved with IVF  Ongoing diarrhea in the setting of COVID-19 enteritis -Patient has finished isolation with no further need for this necessaryunless mandated by hospital -only very small vol loose stool during hospitalization without abd pain or leukocytosis -patient has component of IBS-d contributing -Very likely contributing to hypokalemia  Chronic systolic CHF -9/76/7341 echo EF 35-40%, indeterminate diastolic function, global HK, mild to moderate MR -Clinically euvolemic presently -Holding carvedilol  -Holding Entresto  -follow up with cards, Dr. Harl Bowie before restarting--see his note on 67/30/21  Hypothyroidism -Continue Synthroid  Dementia without behavioral disturbance -Continue Aricept and Namenda  Tobacco abuse I have discussed tobacco cessation with the patient. I have counseled the patient regarding the negative impacts of continued  tobacco use including but not limited to lung cancer, COPD, and cardiovascular disease. I have discussed alternatives to tobacco and modalities that may help facilitate tobacco cessation including but not limited to biofeedback, hypnosis, and medications. Total time spent with tobacco counseling was 4 minutes.   Discharge Diagnoses:  69 y.o.femalewith medical history ofsCHF EF 35-40%,dementia, COPD, CKD 3b hypertension, IBS, remote atrial flutter, hypothyroidism and recent COVID 19 infection presenting with low BP.  Notably, patient was recently admitted to the hospital from 01/07/2020 to 01/08/2020 with COVID-19 enteritis and acute on chronic renal failure.  She remained stable on room air without any pulmonary symptoms.  She was rehydrated with improvement of her renal function.  She was discharged in stable condition with SBP 110-120s.  She was instructed to hold her Entresto and carvedilol for an additional day after discharge.  She presented back to the emergency department on 01/11/2020 secondary to low blood pressures once again with elevated serum creatinine.  Her serum creatinine was noted to be 1.79 11/28/202.  she was given 2 L in the emergency department and discharged home in stable condition.  Unfortunately, the patient continued to have poor oral intake.  She did follow-up with her cardiologist, Dr. Harl Bowie with instructions to continue holding her Entresto and carvedilol.  According to the spouse, there have been no fevers, chills, nausea, vomiting, but there have been occasional small amounts of diarrhea Unfortunately, the patient's blood pressures remain low with SBP 90.  As result, the patient was brought to emergency department for further evaluation.  ED Course:Vital signs stable and patient is afebrile. Potassium is noted to be 2.6 and creatinine is 4.5 with baseline usually around 1.1-1.4. BUN is also elevated at 41 and sodium is 132. She appears dry and has been started on  some IV fluid as well as some potassium supplementation.  Discharge Instructions   Allergies as of 01/19/2020      Reactions   Penicillins Hives, Swelling   Has patient had a PCN reaction causing IMMEDIATE RASH, FACIAL/TONGUE/THROAT SWELLING, SOB, OR LIGHTHEADEDNESS WITH HYPOTENSION:  #  #  #  YES  #  #  #  Has patient had a PCN reaction causing severe rash involving mucus membranes or skin necrosis: No Has patient had a PCN reaction that required hospitalization No Has patient had a PCN reaction occurring within the last 10 years: No If all of the above answers are "NO", then may proceed with Cephalosporin use.   Benadryl [diphenhydramine Hcl] Hives   Msm [methylsulfonylmethane] Hives   Nubain [nalbuphine Hcl] Hives   Sulfa Antibiotics Hives   Codeine    UNSPECIFIED REACTION [IF AT ALL] REFUSES TO TAKE      Medication List    STOP taking these medications   carvedilol 12.5 MG tablet Commonly known as: COREG   donepezil 10 MG tablet Commonly known as: ARICEPT   Entresto 24-26 MG Generic drug: sacubitril-valsartan     TAKE these medications   acetaminophen 500 MG tablet Commonly known as: TYLENOL Take 1,000 mg by mouth every 6 (six) hours as needed (headaches.).   alendronate 70 MG tablet Commonly known as: FOSAMAX Take 70 mg by mouth every Tuesday.   aspirin EC 81 MG tablet Take 81 mg by mouth daily.   B-12 PO Take 6,000 mcg by mouth daily.   BRAIN PO Take 1 tablet by mouth daily. CogniSense Supplement   CALCIUM 600+D3 PO Take 1 tablet by mouth daily with supper.   escitalopram 10 MG tablet Commonly known as: LEXAPRO Take 10 mg by mouth daily with supper.   memantine 10 MG tablet Commonly known as: NAMENDA Take 1 tablet (10 mg total) by mouth 2 (two) times daily.   pyridostigmine 60 MG tablet Commonly known as: MESTINON Take 30 mg by mouth at bedtime.   Synthroid 88 MCG tablet Generic drug: levothyroxine Take 88 mcg by mouth See admin instructions.  Take 1 tablet (88 mcg) by mouth on Sundays, Mondays, Tuesdays, Wednesdays,Thursdays, & Saturdays at bedtime, DOES NOT TAKE ANY Fridays.       Allergies  Allergen Reactions  . Penicillins Hives and Swelling    Has patient had a PCN reaction causing IMMEDIATE RASH, FACIAL/TONGUE/THROAT SWELLING, SOB, OR LIGHTHEADEDNESS WITH HYPOTENSION:  #  #  #  YES  #  #  #  Has patient had a PCN reaction causing severe rash involving mucus membranes or skin necrosis: No Has patient had a PCN reaction that required hospitalization No Has patient had a PCN reaction occurring within the last 10 years: No If all of the above answers are "NO", then may proceed with Cephalosporin use.   . Benadryl [Diphenhydramine Hcl] Hives  . Msm [Methylsulfonylmethane] Hives  . Nubain [Nalbuphine Hcl] Hives  . Sulfa Antibiotics Hives  . Codeine     UNSPECIFIED REACTION [IF AT ALL] REFUSES TO TAKE    Consultations:  none   Procedures/Studies: DG Chest Port 1 View  Result Date: 01/11/2020 CLINICAL DATA:  Questionable sepsis. EXAM: PORTABLE CHEST 1 VIEW COMPARISON:  January 07, 2020. FINDINGS: The heart size and mediastinal contours are within normal limits. Both lungs are clear. No visible pleural effusions or pneumothorax. No acute osseous abnormality. Partially imaged left humeral ORIF hardware. Cholecystectomy clips. IMPRESSION: No acute cardiopulmonary disease. Electronically Signed   By: Margaretha Sheffield MD   On: 01/11/2020 10:01  DG Chest Port 1 View  Result Date: 01/07/2020 CLINICAL DATA:  Weakness, diarrhea EXAM: PORTABLE CHEST 1 VIEW COMPARISON:  02/21/2018 FINDINGS: The heart size and mediastinal contours are stable. No focal airspace consolidation, pleural effusion, or pneumothorax. Partially visualized proximal left humeral ORIF hardware. IMPRESSION: No acute cardiopulmonary findings. Electronically Signed   By: Davina Poke D.O.   On: 01/07/2020 12:08        Discharge Exam: Vitals:    01/19/20 0539 01/19/20 0900  BP: 124/88 127/90  Pulse: 73 77  Resp: 18 16  Temp: 98.2 F (36.8 C)   SpO2: 98% 96%   Vitals:   01/17/20 2024 01/18/20 1500 01/19/20 0539 01/19/20 0900  BP:  126/90 124/88 127/90  Pulse:  78 73 77  Resp:  20 18 16   Temp:  98.7 F (37.1 C) 98.2 F (36.8 C)   TempSrc:      SpO2: 98% 99% 98% 96%  Weight:      Height:        General: Pt is alert, awake, not in acute distress Cardiovascular: RRR, S1/S2 +, no rubs, no gallops Respiratory: diminished BS.  Bibasilar rales. No wheeze Abdominal: Soft, NT, ND, bowel sounds + Extremities: no edema, no cyanosis   The results of significant diagnostics from this hospitalization (including imaging, microbiology, ancillary and laboratory) are listed below for reference.    Significant Diagnostic Studies: DG Chest Port 1 View  Result Date: 01/11/2020 CLINICAL DATA:  Questionable sepsis. EXAM: PORTABLE CHEST 1 VIEW COMPARISON:  January 07, 2020. FINDINGS: The heart size and mediastinal contours are within normal limits. Both lungs are clear. No visible pleural effusions or pneumothorax. No acute osseous abnormality. Partially imaged left humeral ORIF hardware. Cholecystectomy clips. IMPRESSION: No acute cardiopulmonary disease. Electronically Signed   By: Margaretha Sheffield MD   On: 01/11/2020 10:01   DG Chest Port 1 View  Result Date: 01/07/2020 CLINICAL DATA:  Weakness, diarrhea EXAM: PORTABLE CHEST 1 VIEW COMPARISON:  02/21/2018 FINDINGS: The heart size and mediastinal contours are stable. No focal airspace consolidation, pleural effusion, or pneumothorax. Partially visualized proximal left humeral ORIF hardware. IMPRESSION: No acute cardiopulmonary findings. Electronically Signed   By: Davina Poke D.O.   On: 01/07/2020 12:08     Microbiology: Recent Results (from the past 240 hour(s))  Blood culture (routine single)     Status: None   Collection Time: 01/11/20  9:32 AM   Specimen: BLOOD RIGHT HAND   Result Value Ref Range Status   Specimen Description   Final    BLOOD RIGHT HAND BOTTLES DRAWN AEROBIC AND ANAEROBIC   Special Requests Blood Culture adequate volume  Final   Culture   Final    NO GROWTH 5 DAYS Performed at [facility], [address], [place], [place] [address]    Report Status 01/16/2020 FINAL  Final  Urine culture     Status: Abnormal   Collection Time: 01/11/20 11:13 AM   Specimen: Urine, Catheterized  Result Value Ref Range Status   Specimen Description   Final    URINE, CATHETERIZED Performed at [facility], [address] [place], [place] [address]    Special Requests   Final    NONE Performed at [facility], [address] [place], [place] [address]    Culture (A)  Final    80,000 COLONIES/mL LACTOBACILLUS SPECIES Standardized susceptibility testing for this organism is not available. Performed at [facility], [place] [address], [place], [place] [address]    Report  Status 01/13/2020 FINAL  Final  Resp Panel by RT-PCR (Flu A&B, Covid) Nasopharyngeal Swab     Status: Abnormal   Collection Time: 01/17/20  2:25 PM   Specimen: Nasopharyngeal Swab; Nasopharyngeal(NP) swabs in vial transport medium  Result Value Ref Range Status   SARS Coronavirus 2 by RT PCR POSITIVE (A) NEGATIVE Final    Comment: RESULT CALLED TO, READ BACK BY AND VERIFIED WITH: BLEVINS @ 1517 ON [id number] BY HENDERSON L (NOTE) SARS-CoV-2 target nucleic acids are DETECTED.  The SARS-CoV-2 RNA is generally detectable in upper respiratory specimens during the acute phase of infection. Positive results are indicative of the presence of the identified virus, but do not rule out bacterial infection or co-infection with other pathogens not detected by the test. Clinical correlation with patient history and other diagnostic information is necessary to determine patient infection status. The expected result is Negative.  Fact Sheet for  Patients: EntrepreneurPulse.com.au  Fact Sheet for Healthcare Providers: IncredibleEmployment.be  This test is not yet approved or cleared by the [place] FDA and  has been authorized for detection and/or diagnosis of SARS-CoV-2 by FDA under an Emergency Use Authorization (EUA).  This EUA will remain in effect (meaning this test  can be used) for the duration of  the COVID-19 declaration under Section 564(b)(1) of the Act, 21 U.S.C. section 360bbb-3(b)(1), unless the authorization is terminated or revoked sooner.     Influenza A by PCR NEGATIVE NEGATIVE Final   Influenza B by PCR NEGATIVE NEGATIVE Final    Comment: (NOTE) The Xpert Xpress SARS-CoV-2/FLU/RSV plus assay is intended as an aid in the diagnosis of influenza from Nasopharyngeal swab specimens and should not be used as a sole basis for treatment. Nasal washings and aspirates are unacceptable for Xpert Xpress SARS-CoV-2/FLU/RSV testing.  Fact Sheet for Patients: EntrepreneurPulse.com.au  Fact Sheet for Healthcare Providers: IncredibleEmployment.be  This test is not yet approved or cleared by the [place] FDA and has been authorized for detection and/or diagnosis of SARS-CoV-2 by FDA under an Emergency Use Authorization (EUA). This EUA will remain in effect (meaning this test can be used) for the duration of the COVID-19 declaration under Section 564(b)(1) of the Act, 21 U.S.C. section 360bbb-3(b)(1), unless the authorization is terminated or revoked.  Performed at [facility], [address] [place], [place] [address]      Labs: Basic Metabolic Panel: Recent Labs  Lab 01/17/20 1244 01/17/20 1244 01/18/20 0744 01/19/20 1008  NA 132*  --  133* 140  K 2.6*   < > 3.3* 4.4  CL 102  --  108 120*  CO2 18*  --  18* 16*  GLUCOSE 114*  --  106* 106*  BUN 41*  --  36* 22  CREATININE 4.51*  --  3.01* 1.80*  CALCIUM 8.2*  --  7.7*  7.6*  MG  --   --  2.1 1.8   < > = values in this interval not displayed.   Liver Function Tests: Recent Labs  Lab 01/17/20 1244  AST 15  ALT 10  ALKPHOS 70  BILITOT 0.4  PROT 7.8  ALBUMIN 4.0   No results for input(s): LIPASE, AMYLASE in the last 168 hours. No results for input(s): AMMONIA in the last 168 hours. CBC: Recent Labs  Lab 01/17/20 1244 01/18/20 0744  WBC 7.3 7.2  HGB 13.7 11.2*  HCT 40.2 34.6*  MCV 93.9 96.6  PLT 389 333   Cardiac Enzymes: No results for input(s): CKTOTAL, CKMB, CKMBINDEX, TROPONINI  in the last 168 hours. BNP: Invalid input(s): POCBNP CBG: Recent Labs  Lab 01/18/20 2029  GLUCAP 97    Time coordinating discharge:  36 minutes  Signed:  Orson Eva, DO [facility] Pager: [phone] 01/19/2020, 12:52 PM

## 2020-01-19 NOTE — TOC Initial Note (Signed)
Transition of Care [facility]) - Initial/Assessment Note    Patient Details  Name: Melanie Bradley MRN: [id number] Date of Birth: Apr 03, 1950  [facility] CM/SW Contact:    Salome Arnt, [place] Phone Number: 01/19/2020, 11:16 AM  Clinical Narrative:  Pt admitted with acute kidney injury. Assessment completed due to high risk readmission score. Pt's husband completed assessment and per chart, pt is oriented to self only. Pt's husband indicates pt is fairly independent with ADLs at home. She does have diagnosis of dementia and he said pt is okay at home for him to do short errands. Pt recently had COVID. LCSW discussed d/c plan and husband plans on pt returning home. He is working on scheduling PCP appointment for Friday. TOC will continue to follow.                   Expected Discharge Plan: Home/Self Care Barriers to Discharge: Continued Medical Work up   Patient Goals and CMS Choice Patient states their goals for this hospitalization and ongoing recovery are:: return home   Choice offered to / list presented to : Spouse  Expected Discharge Plan and Services Expected Discharge Plan: Home/Self Care       Living arrangements for the past 2 months: Single Family Home                 DME Arranged: N/A         HH Arranged: NA          Prior Living Arrangements/Services Living arrangements for the past 2 months: Single Family Home Lives with:: Spouse Patient language and need for interpreter reviewed:: Yes Do you feel safe going back to the place where you live?: Yes      Need for Family Participation in Patient Care: Yes (Comment) Care giver support system in place?: Yes (comment)   Criminal Activity/Legal Involvement Pertinent to Current Situation/Hospitalization: No - Comment as needed  Activities of Daily Living Home Assistive Devices/Equipment: None ADL Screening (condition at time of admission) Patient's cognitive ability adequate to safely  complete daily activities?: No Is the patient deaf or have difficulty hearing?: No Does the patient have difficulty seeing, even when wearing glasses/contacts?: No Does the patient have difficulty concentrating, remembering, or making decisions?: No Patient able to express need for assistance with ADLs?: Yes Does the patient have difficulty dressing or bathing?: No Independently performs ADLs?: Yes (appropriate for developmental age) Does the patient have difficulty walking or climbing stairs?: No Weakness of Legs: None Weakness of Arms/Hands: None  Permission Sought/Granted                  Emotional Assessment   Attitude/Demeanor/Rapport: Unable to Assess Affect (typically observed): Unable to Assess Orientation: : Oriented to Self Alcohol / Substance Use: Not Applicable Psych Involvement: No (comment)  Admission diagnosis:  Hypokalemia [E87.6] AKI (acute kidney injury) ([place]) [N17.9] Acute renal failure, unspecified acute renal failure type [facility]) [N17.9] Patient Active Problem List   Diagnosis Date Noted  . Gastroenteritis due to COVID-19 virus 01/08/2020  . Hypotension 01/07/2020  . Acute renal failure superimposed on stage 3b chronic kidney disease ([place]) 01/07/2020  . History of diarrhea 11/18/2019  . Chronic systolic heart failure ([place])   . Cardiomyopathy ([place]) 10/28/2018  . AKI (acute kidney injury) ([place])   . Dehydration   . Hypokalemia   . Syncope and collapse   . Syncope 10/26/2018  . C. difficile enteritis 02/22/2018  . Sepsis ([place]) 02/21/2018  .  Leukocytosis 02/21/2018  . Vomiting and diarrhea 02/21/2018  . Dementia without behavioral disturbance ([place]) 02/21/2018  . Hypothyroidism 02/21/2018  . Diarrhea 02/21/2018  . Wrist fracture 06/26/2016  . Humeral fracture 06/26/2016  . Encounter for screening colonoscopy 12/31/2015  . Memory difficulty 06/03/2014  . Headache disorder 06/03/2014  . Atrial flutter ([place]) 05/09/2013  . COPD (chronic obstructive  pulmonary disease) ([place]) 05/09/2013  . Tobacco abuse 05/08/2013  . LBBB (left bundle branch block)- intermittent 05/08/2013  . Chronic diarrhea 05/11/2011  . Chronic nausea 05/11/2011  . Dyspepsia 02/24/2011  . IBS (irritable bowel syndrome) 02/24/2011  . Chest pain 12/05/2010  . Hypertension 12/05/2010   PCP:  Sharilyn Sites, MD Pharmacy:   [facility], [place] [address] Phone: [phone] Fax: [phone]     Social Determinants of Health (SDOH) Interventions    Readmission Risk Interventions Readmission Risk Prevention Plan 01/19/2020  Transportation Screening Complete  HRI or Home Care Consult Complete  Social Work Consult for [place] Planning/Counseling Complete  Palliative Care Screening Not Applicable  Medication Review Press photographer) Complete  Some recent data might be hidden

## 2020-02-02 NOTE — Progress Notes (Signed)
This is a telemedicine visit Patient location: Home Provider location: Office  Cardiology Office Note  Date: 02/03/2020   ID: Melanie, Bradley 1950/12/05, MRN [id number]  PCP:  Sharilyn Sites, MD  Cardiologist:  Carlyle Dolly, MD Electrophysiologist:  None   Chief Complaint: Follow-up chronic systolic heart failure  History of Present Illness: Melanie Bradley is a 69 y.o. female with a history of chronic systolic heart failure, chronic LBBB, dementia/memory deficit, recent Covid infection 12/2019.  Last encounter with Dr. Harl Bowie 01/13/2020 via telemedicine visit.  Prior admission September 2020 for syncope with new diagnosis HFrEF.  Echo with EF 35 to 40%.  No recent shortness of breath or DOE.  She was compliant with her medications. Repeat echo January 2021 LVEF 35 to 40%, mild to moderate MR.  Cardiac catheterization 04/2019: No significant CAD.  Normal filling pressures, cardiac index 3.7, mean PA 15, PCWP 7.  Cardiac MRI 08/03/2019 LVEF 38%.  Normal RV.  No infiltrative disease.  Previously had been started on Aldactone 12.5 mg daily.  Subsequently had hyponatremia with elevated creatinine.  Was stopped.  Sodium stabilizing and creatinine trended back down.  Subsequent admission November 2021 with Covid enteritis/dehydration.  Presented later to ER after discharge with dehydration and hypotension.  On Aricept and followed by neuro for dementia.  Orders were to continue to hold Coreg and Entresto and follow-up in 3 weeks.  Blood pressures remained stable would start Coreg back at 3.125 mg twice daily and titrate over time.  Once Coreg titrated will reintroduce Entresto.  Recent admission 01/17/2020 with acute on chronic renal failure stage IIIb.  Baseline creatinine was 1.1-1.4.  She presented with a serum creatinine of 4.51 secondary to volume depletion.  Melanie Bradley was being held secondary to elevated serum creatinine.  Advised to follow-up with Dr. Harl Bowie cards before restarting.   Serum creatinine returned to 1.8 on day of discharge.  She was hypokalemic which was repleted.  She was also hyponatremic due to volume depletion.  Was having ongoing diarrhea in the setting of COVID-19 enteritis.  Continue to hold carvedilol and Entresto due to volume depletion.  Encouraged to stop smoking.  Past Medical History:  Diagnosis Date  . Atrial fibrillation ([place])   . Bloating   . Complication of anesthesia     " hard to wake up sometimes "  . Dementia ([place])   . Diverticula of colon   . Epigastric burning sensation   . GERD (gastroesophageal reflux disease)   . Headache disorder 06/03/2014  . Headache(784.0)   . Helicobacter pylori gastritis 2004  . Hemorrhoids 06/26/03   Dr. Laural Golden [facility]  . Hiatal hernia 06/26/03   Dr. Laural Golden egd  . Hypertension   . Hypothyroidism   . 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 Memory difficulty 06/03/2014  . Shoulder fracture, left   . Spastic colon   . Wrist fracture     Past Surgical History:  Procedure Laterality Date  . APPENDECTOMY    . CHOLECYSTECTOMY    . COLONOSCOPY  06/26/03   Dr. Kem Boroughs diverticula at the sigmoid colon with one of the transverse colon, normal terminal ileoscopy, small external hemorrhoids the  . COLONOSCOPY N/A 01/20/2016   sigmoid diverticulosis, otherwise normal.   . ESOPHAGOGASTRODUODENOSCOPY  03/15/2011   Dr. Trevor Iha hiatal hernia, abnormal gastric mucosa of unclear significance. Gastric biopsies negative, no H. pylori.Procedure: ESOPHAGOGASTRODUODENOSCOPY (EGD);  Surgeon: Daneil Dolin, MD;  Location: [facility];  Service: Endoscopy;  Laterality: N/A;  7:30  . ORIF HUMERUS FRACTURE Left 05/29/2016   Procedure: LEFT OPEN REDUCTION INTERNAL FIXATION (ORIF) PROXIMAL HUMERUS FRACTURE;  Surgeon: Meredith Pel, MD;  Location: [facility];  Service: Orthopedics;  Laterality: Left;  . ORIF WRIST FRACTURE Left 05/29/2016   Procedure: OPEN REDUCTION INTERNAL FIXATION (ORIF) LEFT WRIST  FRACTURE;  Surgeon: Meredith Pel, MD;  Location: [facility];  Service: Orthopedics;  Laterality: Left;  . RIGHT/LEFT HEART CATH AND CORONARY ANGIOGRAPHY N/A 04/17/2019   Procedure: RIGHT/LEFT HEART CATH AND CORONARY ANGIOGRAPHY;  Surgeon: Belva Crome, MD;  Location: [facility];  Service: Cardiovascular;  Laterality: N/A;  . TUBAL LIGATION    . UPPER GASTROINTESTINAL ENDOSCOPY  06/26/03   Dr. Wynelle Bourgeois sliding hiatal hernia with 8 mm tongue of gastric type mucosa at distal esophagus bile in the stomach.    Current Outpatient Medications  Medication Sig Dispense Refill  . acetaminophen (TYLENOL) 500 MG tablet Take 1,000 mg by mouth every 6 (six) hours as needed (headaches.).    Marland Kitchen alendronate (FOSAMAX) 70 MG tablet Take 70 mg by mouth every Tuesday.     Marland Kitchen aspirin EC 81 MG tablet Take 81 mg by mouth daily.    . Calcium Carb-Cholecalciferol (CALCIUM 600+D3 PO) Take 1 tablet by mouth daily with supper.    . Cyanocobalamin (B-12 PO) Take 6,000 mcg by mouth daily.    Marland Kitchen donepezil (ARICEPT) 10 MG tablet Take 10 mg by mouth at bedtime.    Marland Kitchen escitalopram (LEXAPRO) 10 MG tablet Take 10 mg by mouth daily with supper.   0  . fluocinonide cream (LIDEX) 1.61 % Apply 1 application topically 2 (two) times daily.    . megestrol (MEGACE) 20 MG tablet Take 20 mg by mouth 2 (two) times daily.    . memantine (NAMENDA) 10 MG tablet Take 1 tablet (10 mg total) by mouth 2 (two) times daily. 180 tablet 3  . pyridostigmine (MESTINON) 60 MG tablet Take 30 mg by mouth at bedtime.     Marland Kitchen Specialty Vitamins Products (BRAIN PO) Take 1 tablet by mouth daily. CogniSense Supplement    . SYNTHROID 88 MCG tablet Take 88 mcg by mouth See admin instructions. Take 1 tablet (88 mcg) by mouth on Sundays, Mondays, Tuesdays, Wednesdays,Thursdays, & Saturdays at bedtime, DOES NOT TAKE ANY Fridays.     Current Facility-Administered Medications  Medication Dose Route Frequency Provider Last Rate Last Admin  . sodium chloride  flush (NS) 0.9 % injection 3 mL  3 mL Intravenous Q12H Branch, Alphonse Guild, MD       Allergies:  Penicillins, Benadryl [diphenhydramine hcl], Msm [methylsulfonylmethane], Nubain [nalbuphine hcl], Sulfa antibiotics, and Codeine   Social History: The patient  reports that she has been smoking cigarettes. She started smoking about 53 years ago. She has a 45.00 pack-year smoking history. She has never used smokeless tobacco. She reports that she does not drink alcohol and does not use drugs.   Family History: The patient's family history includes Angina in her mother; COPD in her father; Cancer in her sister; Cardiomyopathy in her brother; Dementia in her sister; Heart failure in her father; Hypertension in her mother; Migraines in her sister; Pneumonia in an other family member.   ROS:  Please see the history of present illness. Otherwise, complete review of systems is positive for none.  All other systems are reviewed and negative.   Physical Exam: VS:  BP 107/80   Pulse 94   Ht 5\' 1"  (1.549  m)   Wt 120 lb (54.4 kg)   BMI 22.67 kg/m , BMI Body mass index is 22.67 kg/m.  Wt Readings from Last 3 Encounters:  02/03/20 120 lb (54.4 kg)  01/17/20 115 lb (52.2 kg)  01/13/20 115 lb (52.2 kg)    Spoke to husband via phone.  Patient has dementia.  ECG: No EKG today due to telemedicine visit  Recent Labwork: 01/17/2020: ALT 10; AST 15 01/18/2020: Hemoglobin 11.2; Platelets 333 01/19/2020: BUN 22; Creatinine, Ser 1.80; Magnesium 1.8; Potassium 4.4; Sodium 140     Component Value Date/Time   CHOL 173 05/09/2013 0345   TRIG 83 05/09/2013 0345   HDL 55 05/09/2013 0345   CHOLHDL 3.1 05/09/2013 0345   VLDL 17 05/09/2013 0345   LDLCALC 101 (H) 05/09/2013 0345    Other Studies Reviewed Today:  04/2013 Lexiscan MPI FINDINGS:  The patient's initial EKG revealed a narrow QRS. After she received  Lexiscan, the heart rate increased to 145 and the QRS widened to a  left bundle branch block. The  increased rate persisted for several  minutes but then began to slow. When the heart rate was slower, P  waves could be seen. There were no flutter waves. The raw data  reveals no evidence of excess motion. Tomographic images with stress  reveal a small area of moderate decreased uptake in the septum. Rest  images reveal a moderate area of decreased uptake in the septum.  These findings are most consistent with left bundle branch block.  Wall motion analysis reveals excellent motion with an ejection  fraction of 70%.  IMPRESSION:  This is a low risk scan. There is no scar or ischemia. There is  decreased activity in the septum, consistent with left bundle branch  block. It is noted that the QRS was narrow at the start of the  study. However with Lexiscan, the patient developed tachycardia with  left bundle branch block. Assessment shows that this was most  probably sinus tachycardia which slowed over time.  10/2018 echo IMPRESSIONS   1. The left ventricle has moderately reduced systolic function, with an ejection fraction of 35-40%. The cavity size was normal. Severe focal basal septal hypertrophy.. Diastolic dysfunction, grade indeterminate. Left ventricular diffuse hypokinesis. 2. The right ventricle has normal systolic function. The cavity was normal. There is no increase in right ventricular wall thickness. 3. The aortic valve is tricuspid. Mild thickening of the aortic valve. Mild aortic annular calcification noted. 4. The mitral valve is grossly normal. Mild thickening of the mitral valve leaflet. Mitral valve regurgitation is mild to moderate by color flow Doppler. 5. The tricuspid valve is grossly normal. 6. The aorta is normal unless otherwise noted.  12/2018 30 day event monitor  30 day event monitor  Min HR 57, Max HR 130, Avg HR 79  No symptoms reported  Telemetry tracings show sinus rhythm with occasional PVCs.  No significant  arrhythmias  Jan 2021 echo IMPRESSIONS    1. Left ventricular ejection fraction, by visual estimation, is 35 to  40%. The left ventricle has moderately decreased function. There is no  left ventricular hypertrophy.  2. Left ventricular diastolic parameters are indeterminate.  3. The left ventricle demonstrates global hypokinesis.  4. Global right ventricle has normal systolic function.The right  ventricular size is normal. No increase in right ventricular wall  thickness.  5. Left atrial size was normal.  6. Right atrial size was normal.  7. The mitral valve is normal in structure. Mild  to moderate mitral valve  regurgitation. No evidence of mitral stenosis.  8. The tricuspid valve is normal in structure.  9. The aortic valve has an indeterminant number of cusps. Aortic valve  regurgitation is not visualized. No evidence of aortic valve sclerosis or  stenosis.  10. The pulmonic valve was not well visualized. Pulmonic valve  regurgitation is not visualized.  11. The inferior vena cava is normal in size with greater than 50%  respiratory variability, suggesting right atrial pressure of 3 mmHg.     04/2019 RHC/LHC  Normal right heart pressures. Cardiac output 3.7 L/min. Cardiac index 2.5 L/min/m  Normal left main  Transapical LAD. First diagonal contains segmental proximal 40% narrowing.  Normal small ramus.  Circumflex is normal.  The right coronary demonstrates generalized minimal atherosclerosis. No focal stenosis.  Anterior and apical mild to moderate hypokinesis. Estimated ejection fraction 35 to 40%.  RECOMMENDATIONS:   Guideline directed therapy for left ventricular systolic dysfunction in the setting of left bundle branch block. If no improvement in LV function with therapy, consider resync.   Assessment and Plan:  1. Chronic systolic heart failure ([place])   2. Essential hypertension   3. CAD in native artery    1. Chronic systolic  heart failure ([place]) Spoke with husband today.  Patient continues with soft blood pressures per husband's statement.  Husband states day before yesterday her systolic blood pressure was in the 90s.  States he gave her more fluid with liquid IV p.o. which increased her blood pressure to 324 systolic.  Today's blood pressure is 107/80.  We will continue to hold carvedilol and Entresto until she sees Dr. Harl Bowie on January 10 at 1:40 PM at [facility] office.  2. Essential hypertension Blood pressure today per home blood pressure is 107/80.  Continue hydration with liquid IV per doctor's instructions.  We will continue to hold carvedilol and Entresto due to soft blood pressures.  3.  CAD Cardiac catheterization 04/17/2019 first diagonal lesion 50% stenosis.  RCA exhibits minimal luminal irregularities.  LCx second obtuse marginal vessel small in size.  Continue aspirin 81 mg daily.     Medication Adjustments/Labs and Tests Ordered: Current medicines are reviewed at length with the patient today.  Concerns regarding medicines are outlined above.   Disposition: Follow-up with with Dr. Harl Bowie at scheduled visit January 10 at 1:40 PM in [place] office.  Spoke to patient's husband 10 minutes during telemedicine visit   Signed, Levell July, NP 02/03/2020 9:44 AM    [facility] [place], [place], [place] [address] Phone: [phone]; Fax: [phone]

## 2020-02-03 ENCOUNTER — Telehealth (INDEPENDENT_AMBULATORY_CARE_PROVIDER_SITE_OTHER): Payer: Medicare HMO | Admitting: Family Medicine

## 2020-02-03 ENCOUNTER — Encounter: Payer: Self-pay | Admitting: Family Medicine

## 2020-02-03 VITALS — BP 107/80 | HR 94 | Ht 61.0 in | Wt 120.0 lb

## 2020-02-03 DIAGNOSIS — I10 Essential (primary) hypertension: Secondary | ICD-10-CM | POA: Diagnosis not present

## 2020-02-03 DIAGNOSIS — I2510 Atherosclerotic heart disease of native coronary artery without angina pectoris: Secondary | ICD-10-CM | POA: Diagnosis not present

## 2020-02-03 DIAGNOSIS — I5022 Chronic systolic (congestive) heart failure: Secondary | ICD-10-CM

## 2020-02-03 NOTE — Patient Instructions (Addendum)
Medication Instructions:   Your physician recommends that you continue on your current medications as directed. Please refer to the Current Medication list given to you today.  Labwork:  None  Testing/Procedures:  None  Follow-Up:  Your physician recommends that you schedule a follow-up appointment in: as planned  Any Other Special Instructions Will Be Listed Below (If Applicable).  If you need a refill on your cardiac medications before your next appointment, please call your pharmacy. 

## 2020-02-23 ENCOUNTER — Other Ambulatory Visit: Payer: Self-pay

## 2020-02-23 ENCOUNTER — Ambulatory Visit: Payer: Medicare HMO | Admitting: Cardiology

## 2020-02-23 ENCOUNTER — Encounter: Payer: Self-pay | Admitting: Cardiology

## 2020-02-23 VITALS — HR 100 | Ht 61.0 in | Wt 112.0 lb

## 2020-02-23 DIAGNOSIS — I5022 Chronic systolic (congestive) heart failure: Secondary | ICD-10-CM | POA: Diagnosis not present

## 2020-02-23 NOTE — Progress Notes (Signed)
Clinical Summary Ms. Bonnet is a 70 y.o.female seen today for follow up of the following medical problems.   1. Chronic systolic HF - new diagnosis during 10/2018 admission with syncope. Echo showed LVEF 35-40% - no recent SOb/DOE. Compliant with meds - bp is 114/75, HR 64 today   Jan 2021 echo LVEF 35-40%, mild to mod MR.  04/2019 no significant CAD, normal filling pressures. CI 3.7 Mean PA 15, PCWP 7 07/2019 CMRI: LVEF 38%, normal RV. No infiltrative disease    -has had recent long term GI issues, partly chronic and partly exacerbated by recent COVID infection. Has lost 13 lbs, chronic intermittent diarrhea that has led to low bp's and intermittent issues with dehydration. Recent admission with dehydration, significant AKI - off all CHF meds due to low bp's, recent AKI - ongoing soft bp's at home. Ongoing diarrhea, husband works to encourage hydration but patient only partly consistent     2.ChronicLBBB - no significant CAD by cath 04/2019    3. Memory deficit/Dementia - on aricept, followed by neuro -remains active, independent. Enjoys doing yard and housework.   4. COVID 19 - admitted 12/2019 with COVID, primarily GI/diarrhea symptoms. Presented dehydrated with AKI on CKD - still with decreased oral intake, soft bp's at home. Husband working to encourage fluids.   - intermittent diarrhea ongoing. 2-3 times loose watery stools.  - increasing appetite but still not back at baseline, started on megace  5. CKD - recent admission 01/2020 with AKI on CKD, Cr up to 4.5 in setting of deyhdyration in setting of ongoing diarrhea related to COVid enteritis - entresto held     Past Medical History:  Diagnosis Date  . Atrial fibrillation ([place])   . Bloating   . Complication of anesthesia     " hard to wake up sometimes "  . Dementia ([place])   . Diverticula of colon   . Epigastric burning sensation   . GERD (gastroesophageal reflux disease)   .  Headache disorder 06/03/2014  . Headache(784.0)   . Helicobacter pylori gastritis 2004  . Hemorrhoids 06/26/03   Dr. Laural Golden [facility]  . Hiatal hernia 06/26/03   Dr. Laural Golden [facility]  . Hypertension   . Hypothyroidism   . Left bundle Obed Samek block    rate dependent LBBB 04/2013  . Memory difficulty 06/03/2014  . Shoulder fracture, left   . Spastic colon   . Wrist fracture      Allergies  Allergen Reactions  . Penicillins Hives and Swelling    Has patient had a PCN reaction causing IMMEDIATE RASH, FACIAL/TONGUE/THROAT SWELLING, SOB, OR LIGHTHEADEDNESS WITH HYPOTENSION:  #  #  #  YES  #  #  #  Has patient had a PCN reaction causing severe rash involving mucus membranes or skin necrosis: No Has patient had a PCN reaction that required hospitalization No Has patient had a PCN reaction occurring within the last 10 years: No If all of the above answers are "NO", then may proceed with Cephalosporin use.   . Benadryl [Diphenhydramine Hcl] Hives  . Msm [Methylsulfonylmethane] Hives  . Nubain [Nalbuphine Hcl] Hives  . Sulfa Antibiotics Hives  . Codeine     UNSPECIFIED REACTION [IF AT ALL] REFUSES TO TAKE     Current Outpatient Medications  Medication Sig Dispense Refill  . acetaminophen (TYLENOL) 500 MG tablet Take 1,000 mg by mouth every 6 (six) hours as needed (headaches.).    Marland Kitchen alendronate (FOSAMAX) 70 MG tablet Take 70 mg  by mouth every Tuesday.     Marland Kitchen aspirin EC 81 MG tablet Take 81 mg by mouth daily.    . Calcium Carb-Cholecalciferol (CALCIUM 600+D3 PO) Take 1 tablet by mouth daily with supper.    . Cyanocobalamin (B-12 PO) Take 6,000 mcg by mouth daily.    Marland Kitchen donepezil (ARICEPT) 10 MG tablet Take 10 mg by mouth at bedtime.    Marland Kitchen escitalopram (LEXAPRO) 10 MG tablet Take 10 mg by mouth daily with supper.   0  . fluocinonide cream (LIDEX) [id number] % Apply 1 application topically 2 (two) times daily.    . megestrol (MEGACE) 20 MG tablet Take 20 mg by mouth 2 (two) times daily.    . memantine  (NAMENDA) 10 MG tablet Take 1 tablet (10 mg total) by mouth 2 (two) times daily. 180 tablet 3  . pyridostigmine (MESTINON) 60 MG tablet Take 30 mg by mouth at bedtime.     Marland Kitchen Specialty Vitamins Products (BRAIN PO) Take 1 tablet by mouth daily. CogniSense Supplement    . SYNTHROID 88 MCG tablet Take 88 mcg by mouth See admin instructions. Take 1 tablet (88 mcg) by mouth on Sundays, Mondays, Tuesdays, Wednesdays,Thursdays, & Saturdays at bedtime, DOES NOT TAKE ANY Fridays.     Current Facility-Administered Medications  Medication Dose Route Frequency Provider Last Rate Last Admin  . sodium chloride flush (NS) 0.9 % injection 3 mL  3 mL Intravenous Q12H Saniah Schroeter, Alphonse Guild, MD         Past Surgical History:  Procedure Laterality Date  . APPENDECTOMY    . CHOLECYSTECTOMY    . COLONOSCOPY  06/26/03   Dr. Kem Boroughs diverticula at the sigmoid colon with one of the transverse colon, normal terminal ileoscopy, small external hemorrhoids the  . COLONOSCOPY N/A 01/20/2016   sigmoid diverticulosis, otherwise normal.   . ESOPHAGOGASTRODUODENOSCOPY  03/15/2011   Dr. Trevor Iha hiatal hernia, abnormal gastric mucosa of unclear significance. Gastric biopsies negative, no H. pylori.Procedure: ESOPHAGOGASTRODUODENOSCOPY (EGD);  Surgeon: Daneil Dolin, MD;  [facility];  Service: Endoscopy;  Laterality: N/A;  7:30  . ORIF HUMERUS FRACTURE Left 05/29/2016   Procedure: LEFT OPEN REDUCTION INTERNAL FIXATION (ORIF) PROXIMAL HUMERUS FRACTURE;  Surgeon: Meredith Pel, MD;  Location: [facility];  Service: Orthopedics;  Laterality: Left;  . ORIF WRIST FRACTURE Left 05/29/2016   Procedure: OPEN REDUCTION INTERNAL FIXATION (ORIF) LEFT WRIST FRACTURE;  Surgeon: Meredith Pel, MD;  Location: [facility];  Service: Orthopedics;  Laterality: Left;  . RIGHT/LEFT HEART CATH AND CORONARY ANGIOGRAPHY N/A 04/17/2019   Procedure: RIGHT/LEFT HEART CATH AND CORONARY ANGIOGRAPHY;  Surgeon: Belva Crome, MD;  Location:  [facility];  Service: Cardiovascular;  Laterality: N/A;  . TUBAL LIGATION    . UPPER GASTROINTESTINAL ENDOSCOPY  06/26/03   Dr. Wynelle Bourgeois sliding hiatal hernia with 8 mm tongue of gastric type mucosa at distal esophagus bile in the stomach.     Allergies  Allergen Reactions  . Penicillins Hives and Swelling    Has patient had a PCN reaction causing IMMEDIATE RASH, FACIAL/TONGUE/THROAT SWELLING, SOB, OR LIGHTHEADEDNESS WITH HYPOTENSION:  #  #  #  YES  #  #  #  Has patient had a PCN reaction causing severe rash involving mucus membranes or skin necrosis: No Has patient had a PCN reaction that required hospitalization No Has patient had a PCN reaction occurring within the last 10 years: No If all of the above answers are "NO", then may proceed with Cephalosporin  use.   . Benadryl [Diphenhydramine Hcl] Hives  . Msm [Methylsulfonylmethane] Hives  . Nubain [Nalbuphine Hcl] Hives  . Sulfa Antibiotics Hives  . Codeine     UNSPECIFIED REACTION [IF AT ALL] REFUSES TO TAKE      Family History  Problem Relation Age of Onset  . Hypertension Mother   . Angina Mother   . COPD Father   . Heart failure Father   . Cancer Sister   . Dementia Sister   . Cardiomyopathy Brother   . Pneumonia Other   . Migraines Sister   . Colon cancer Neg Hx      Social History Ms. Siguenza reports that she has been smoking cigarettes. She started smoking about 53 years ago. She has a 45.00 pack-year smoking history. She has never used smokeless tobacco. Ms. Leatherwood reports no history of alcohol use.   Review of Systems CONSTITUTIONAL: No weight loss, fever, chills, weakness or fatigue.  HEENT: Eyes: No visual loss, blurred vision, double vision or yellow sclerae.No hearing loss, sneezing, congestion, runny nose or sore throat.  SKIN: No rash or itching.  CARDIOVASCULAR: per hpi RESPIRATORY: No shortness of breath, cough or sputum.  GASTROINTESTINAL: No anorexia, nausea, vomiting or  diarrhea. No abdominal pain or blood.  GENITOURINARY: No burning on urination, no polyuria NEUROLOGICAL: No headache, dizziness, syncope, paralysis, ataxia, numbness or tingling in the extremities. No change in bowel or bladder control.  MUSCULOSKELETAL: No muscle, back pain, joint pain or stiffness.  LYMPHATICS: No enlarged nodes. No history of splenectomy.  PSYCHIATRIC: No history of depression or anxiety.  ENDOCRINOLOGIC: No reports of sweating, cold or heat intolerance. No polyuria or polydipsia.  Marland Kitchen   Physical Examination Today's Vitals   02/23/20 1344  Pulse: 100  SpO2: 96%  Weight: 112 lb (50.8 kg)  Height: 5\' 1"  (1.549 m)   Body mass index is 21.16 kg/m.  Gen: resting comfortably, no acute distress HEENT: no scleral icterus, pupils equal round and reactive, no palptable cervical adenopathy,  CV: RRR, no m/r/g, no jvd Resp: Clear to auscultation bilaterally GI: abdomen is soft, non-tender, non-distended, normal bowel sounds, no hepatosplenomegaly MSK: extremities are warm, no edema.  Skin: warm, no rash Neuro:  no focal deficits Psych: appropriate affect   Diagnostic Studies  04/2013 Lexiscan MPI FINDINGS:  The patient's initial EKG revealed a narrow QRS. After she received  Lexiscan, the heart rate increased to 145 and the QRS widened to a  left bundle Braedin Millhouse block. The increased rate persisted for several  minutes but then began to slow. When the heart rate was slower, P  waves could be seen. There were no flutter waves. The raw data  reveals no evidence of excess motion. Tomographic images with stress  reveal a small area of moderate decreased uptake in the septum. Rest  images reveal a moderate area of decreased uptake in the septum.  These findings are most consistent with left bundle Kathryn Cosby block.  Wall motion analysis reveals excellent motion with an ejection  fraction of 70%.  IMPRESSION:  This is a low risk scan. There is no scar or ischemia.  There is  decreased activity in the septum, consistent with left bundle Dionisia Pacholski  block. It is noted that the QRS was narrow at the start of the  study. However with Lexiscan, the patient developed tachycardia with  left bundle Caleen Taaffe block. Assessment shows that this was most  probably sinus tachycardia which slowed over time.  10/2018 echo IMPRESSIONS   1. The  left ventricle has moderately reduced systolic function, with an ejection fraction of 35-40%. The cavity size was normal. Severe focal basal septal hypertrophy.. Diastolic dysfunction, grade indeterminate. Left ventricular diffuse hypokinesis. 2. The right ventricle has normal systolic function. The cavity was normal. There is no increase in right ventricular wall thickness. 3. The aortic valve is tricuspid. Mild thickening of the aortic valve. Mild aortic annular calcification noted. 4. The mitral valve is grossly normal. Mild thickening of the mitral valve leaflet. Mitral valve regurgitation is mild to moderate by color flow Doppler. 5. The tricuspid valve is grossly normal. 6. The aorta is normal unless otherwise noted.  12/2018 30 day event monitor  30 day event monitor  Min HR 57, Max HR 130, Avg HR 79  No symptoms reported  Telemetry tracings show sinus rhythm with occasional PVCs.  No significant arrhythmias  Jan 2021 echo IMPRESSIONS    1. Left ventricular ejection fraction, by visual estimation, is 35 to  40%. The left ventricle has moderately decreased function. There is no  left ventricular hypertrophy.  2. Left ventricular diastolic parameters are indeterminate.  3. The left ventricle demonstrates global hypokinesis.  4. Global right ventricle has normal systolic function.The right  ventricular size is normal. No increase in right ventricular wall  thickness.  5. Left atrial size was normal.  6. Right atrial size was normal.  7. The mitral valve is normal in structure. Mild to  moderate mitral valve  regurgitation. No evidence of mitral stenosis.  8. The tricuspid valve is normal in structure.  9. The aortic valve has an indeterminant number of cusps. Aortic valve  regurgitation is not visualized. No evidence of aortic valve sclerosis or  stenosis.  10. The pulmonic valve was not well visualized. Pulmonic valve  regurgitation is not visualized.  11. The inferior vena cava is normal in size with greater than 50%  respiratory variability, suggesting right atrial pressure of 3 mmHg.     04/2019 RHC/LHC  Normal right heart pressures. Cardiac output 3.7 L/min. Cardiac index 2.5 L/min/m  Normal left main  Transapical LAD. First diagonal contains segmental proximal 40% narrowing.  Normal small ramus.  Circumflex is normal.  The right coronary demonstrates generalized minimal atherosclerosis. No focal stenosis.  Anterior and apical mild to moderate hypokinesis. Estimated ejection fraction 35 to 40%.  RECOMMENDATIONS:   Guideline directed therapy for left ventricular systolic dysfunction in the setting of left bundle Sandrine Bloodsworth block. If no improvement in LV function with therapy, consider resync.    Assessment and Plan   1. Chronic systolic HF -ongoing issues with low bp's after admission with COVID enteroritis and dehydration, recent admission with significant AKI - bp's remains soft at times, continue to hold home CHF regimen - they are to to f/u with GI - is bp stabilizes would try to restart her meds, likely low dose toprol to begin with.  -request labs from pcp, may need to repeat next visit     Arnoldo Lenis, M.D.

## 2020-02-23 NOTE — Patient Instructions (Signed)
Medication Instructions:  Your physician recommends that you continue on your current medications as directed. Please refer to the Current Medication list given to you today.  *If you need a refill on your cardiac medications before your next appointment, please call your pharmacy*   Lab Work: None today If you have labs (blood work) drawn today and your tests are completely normal, you will receive your results only by: Marland Kitchen MyChart Message (if you have MyChart) OR . A paper copy in the mail If you have any lab test that is abnormal or we need to change your treatment, we will call you to review the results.   Testing/Procedures: None today   Follow-Up: At [facility], you and your health needs are our priority.  As part of our continuing mission to provide you with exceptional heart care, we have created designated Provider Care Teams.  These Care Teams include your primary Cardiologist (physician) and Advanced Practice Providers (APPs -  Physician Assistants and Nurse Practitioners) who all work together to provide you with the care you need, when you need it.  We recommend signing up for the patient portal called "MyChart".  Sign up information is provided on this After Visit Summary.  MyChart is used to connect with patients for Virtual Visits (Telemedicine).  Patients are able to view lab/test results, encounter notes, upcoming appointments, etc.  Non-urgent messages can be sent to your provider as well.   To learn more about what you can do with MyChart, go to [email or web address].    Your next appointment:   6 week(s)  The format for your next appointment:   In Person  Provider:   Carlyle Dolly, MD   Other Instructions None      Thank you for choosing [facility] !

## 2020-02-24 ENCOUNTER — Other Ambulatory Visit: Payer: Self-pay

## 2020-02-24 ENCOUNTER — Telehealth: Payer: Self-pay | Admitting: Internal Medicine

## 2020-02-24 DIAGNOSIS — R197 Diarrhea, unspecified: Secondary | ICD-10-CM

## 2020-02-24 NOTE — Telephone Encounter (Signed)
patient husband called and said that his wife is having diarrhea, she had c-diff last year and he thinks she needs to be checked again prior to her upcoming office visit, can they get a sample kit?

## 2020-02-24 NOTE — Telephone Encounter (Signed)
Absolutely. Can check for Cdiff.

## 2020-02-24 NOTE — Telephone Encounter (Signed)
Spoke with pts spouse. He is aware that orders were placed and he can pick containers up from [facility].

## 2020-02-24 NOTE — Telephone Encounter (Signed)
Pt was in the hospital 3 times after thanksgiving and pts doctors weren't concerned with the small amount of diarrhea pt had. On last week, pt starting having diarrhea 3 times daily. Stool is watery and pt reports no pain, no weakness at this time. Pt would like to test for C diff. Please advise.

## 2020-02-26 LAB — C. DIFFICILE GDH AND TOXIN A/B
GDH ANTIGEN: NOT DETECTED
MICRO NUMBER:: 11413343
SPECIMEN QUALITY:: ADEQUATE
TOXIN A AND B: NOT DETECTED

## 2020-03-07 ENCOUNTER — Ambulatory Visit (INDEPENDENT_AMBULATORY_CARE_PROVIDER_SITE_OTHER): Payer: Medicare HMO

## 2020-03-07 ENCOUNTER — Encounter: Payer: Self-pay | Admitting: Emergency Medicine

## 2020-03-07 ENCOUNTER — Ambulatory Visit
Admission: EM | Admit: 2020-03-07 | Discharge: 2020-03-07 | Disposition: A | Discharge status: Home / Self Care | Payer: Medicare HMO | Attending: Family Medicine | Admitting: Family Medicine

## 2020-03-07 ENCOUNTER — Other Ambulatory Visit: Payer: Self-pay

## 2020-03-07 DIAGNOSIS — M25572 Pain in left ankle and joints of left foot: Secondary | ICD-10-CM | POA: Diagnosis not present

## 2020-03-07 DIAGNOSIS — S82832A Other fracture of upper and lower end of left fibula, initial encounter for closed fracture: Secondary | ICD-10-CM

## 2020-03-07 DIAGNOSIS — M25472 Effusion, left ankle: Secondary | ICD-10-CM

## 2020-03-07 DIAGNOSIS — W19XXXA Unspecified fall, initial encounter: Secondary | ICD-10-CM

## 2020-03-07 NOTE — Discharge Instructions (Addendum)
You have a small fracture to the L ankle  We have placed a splint to the area  May take tylenol and ibuprofen for pain and swelling. May apply ice to the area.  Follow up with orthopedics next week

## 2020-03-07 NOTE — ED Triage Notes (Signed)
Fell yesterday swelling to left foot/ankle

## 2020-03-10 ENCOUNTER — Encounter: Payer: Self-pay | Admitting: Gastroenterology

## 2020-03-10 ENCOUNTER — Ambulatory Visit: Payer: Medicare HMO | Admitting: Gastroenterology

## 2020-03-10 ENCOUNTER — Other Ambulatory Visit: Payer: Self-pay

## 2020-03-10 VITALS — BP 118/76 | HR 48 | Temp 97.0°F | Ht 61.0 in | Wt 112.2 lb

## 2020-03-10 DIAGNOSIS — K529 Noninfective gastroenteritis and colitis, unspecified: Secondary | ICD-10-CM | POA: Diagnosis not present

## 2020-03-10 NOTE — Patient Instructions (Addendum)
Let's avoid Lomotil for now. Start Imodium 2 milligrams (1 tablet) before breakfast and can have an additional dose before dinner. Monitor for constipation, abdominal pain, worsening diarrhea.   Please call me if no improvement with this.  We will see you in 4-6 weeks for close follow-up!  I enjoyed seeing you again today! As you know, I value our relationship and want to provide genuine, compassionate, and quality care. I welcome your feedback. If you receive a survey regarding your visit,  I greatly appreciate you taking time to fill this out. See you next time!  Annitta Needs, PhD, ANP-BC [facility]

## 2020-03-10 NOTE — Progress Notes (Signed)
Referring Provider: Sharilyn Sites, MD Primary Care Physician:  Sharilyn Sites, MD Primary GI: Dr. Gala Romney   Chief Complaint  Patient presents with  . Diarrhea    Some daily, was having several episodes per day    HPI:   Melanie Bradley is a 70 y.o. female presenting today with a history of acute onset of diarrhea shortly after eating at a local restaurant in Sept 2020, requiring hospitalization as she had failed outpatient supportive therapy. Cdiff negative, GI pathogen panel negative. Empirically treated with antibiotic therapy. TSH wasnormal. Last colonoscopy in 2017 with sigmoid diverticulosis, otherwise normal. 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 Last seen in Oct 2021.   Presents today with recurrent diarrhea concerns. Around Thanksgiving had Covid and diarrhea. Diarrhea much improved. She had called in several weeks ago and having large amounts of diarrhea. In last 3-4 days had one time of messing up britches. Cdiff negative Feb 25, 2020. Was taking Lomotil, but this made her sleepy. Feels like she had been back to normal around Thanksgiving. 4-5 stools that are soft per day now. Not watery. Eating better. Taking Megace BID.   Past Medical History:  Diagnosis Date  . Atrial fibrillation ([place])   . Bloating   . Complication of anesthesia     " hard to wake up sometimes "  . Dementia ([place])   . Diverticula of colon   . Epigastric burning sensation   . GERD (gastroesophageal reflux disease)   . Headache disorder 06/03/2014  . Headache(784.0)   . Helicobacter pylori gastritis 2004  . Hemorrhoids 06/26/03   Dr. Laural Golden tcs  . Hiatal hernia 06/26/03   Dr. Laural Golden egd  . Hypertension   . Hypothyroidism   . 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 Memory difficulty 06/03/2014  . Shoulder fracture, left   . Spastic colon   . Wrist fracture     Past Surgical History:  Procedure Laterality Date  . APPENDECTOMY    . CHOLECYSTECTOMY     . COLONOSCOPY  06/26/03   Dr. Kem Boroughs diverticula at the sigmoid colon with one of the transverse colon, normal terminal ileoscopy, small external hemorrhoids the  . COLONOSCOPY N/A 01/20/2016   sigmoid diverticulosis, otherwise normal.   . ESOPHAGOGASTRODUODENOSCOPY  03/15/2011   Dr. Trevor Iha hiatal hernia, abnormal gastric mucosa of unclear significance. Gastric biopsies negative, no H. pylori.Procedure: ESOPHAGOGASTRODUODENOSCOPY (EGD);  Surgeon: Daneil Dolin, MD;  [facility];  Service: Endoscopy;  Laterality: N/A;  7:30  . ORIF HUMERUS FRACTURE Left 05/29/2016   Procedure: LEFT OPEN REDUCTION INTERNAL FIXATION (ORIF) PROXIMAL HUMERUS FRACTURE;  Surgeon: Meredith Pel, MD;  Location: [place];  Service: Orthopedics;  Laterality: Left;  . ORIF WRIST FRACTURE Left 05/29/2016   Procedure: OPEN REDUCTION INTERNAL FIXATION (ORIF) LEFT WRIST FRACTURE;  Surgeon: Meredith Pel, MD;  Location: [place];  Service: Orthopedics;  Laterality: Left;  . RIGHT/LEFT HEART CATH AND CORONARY ANGIOGRAPHY N/A 04/17/2019   Procedure: RIGHT/LEFT HEART CATH AND CORONARY ANGIOGRAPHY;  Surgeon: Belva Crome, MD;  Location: [place] [facility];  Service: Cardiovascular;  Laterality: N/A;  . TUBAL LIGATION    . UPPER GASTROINTESTINAL ENDOSCOPY  06/26/03   Dr. Wynelle Bourgeois sliding hiatal hernia with 8 mm tongue of gastric type mucosa at distal esophagus bile in the stomach.    Current Outpatient Medications  Medication Sig Dispense Refill  . acetaminophen (TYLENOL) 500 MG tablet Take 1,000  mg by mouth every 6 (six) hours as needed (headaches.).    Marland Kitchen alendronate (FOSAMAX) 70 MG tablet Take 70 mg by mouth every Tuesday.     Marland Kitchen aspirin EC 81 MG tablet Take 81 mg by mouth daily.    . Calcium Carb-Cholecalciferol (CALCIUM 600+D3 PO) Take 1 tablet by mouth daily with supper.    . Cyanocobalamin (B-12 PO) Take 6,000 mcg by mouth daily.    Marland Kitchen donepezil (ARICEPT) 10 MG tablet Take 10 mg by mouth at  bedtime.    Marland Kitchen escitalopram (LEXAPRO) 10 MG tablet Take 10 mg by mouth daily with supper.   0  . fluocinonide cream (LIDEX) 0.92 % Apply 1 application topically 2 (two) times daily.    . megestrol (MEGACE) 20 MG tablet Take 20 mg by mouth 2 (two) times daily.    . memantine (NAMENDA) 10 MG tablet Take 1 tablet (10 mg total) by mouth 2 (two) times daily. 180 tablet 3  . pyridostigmine (MESTINON) 60 MG tablet Take 30 mg by mouth at bedtime.     Marland Kitchen Specialty Vitamins Products (BRAIN PO) Take 1 tablet by mouth daily. CogniSense Supplement    . SYNTHROID 88 MCG tablet Take 88 mcg by mouth See admin instructions. Take 1 tablet (88 mcg) by mouth on Sundays, Mondays, Tuesdays, Wednesdays,Thursdays, & Saturdays at bedtime, DOES NOT TAKE ANY Fridays.     Current Facility-Administered Medications  Medication Dose Route Frequency Provider Last Rate Last Admin  . sodium chloride flush (NS) 0.9 % injection 3 mL  3 mL Intravenous Q12H Arnoldo Lenis, MD        Allergies as of 03/10/2020 - Review Complete 03/10/2020  Allergen Reaction Noted  . Penicillins Hives and Swelling 07/28/2010  . Benadryl [diphenhydramine hcl] Hives 12/02/2010  . Msm [methylsulfonylmethane] Hives 06/02/2014  . Nubain [nalbuphine hcl] Hives 06/02/2014  . Sulfa antibiotics Hives 07/28/2010  . Codeine  07/28/2010    Family History  Problem Relation Age of Onset  . Hypertension Mother   . Angina Mother   . COPD Father   . Heart failure Father   . Cancer Sister   . Dementia Sister   . Cardiomyopathy Brother   . Pneumonia Other   . Migraines Sister   . Colon cancer Neg Hx     Social History   Socioeconomic History  . Marital status: Married    Spouse name: Not on file  . Number of children: Not on file  . Years of education: 27  . Highest education level: Not on file  Occupational History  . Not on file  Tobacco Use  . Smoking status: Current Every Day Smoker    Packs/day: 1.00    Years: 45.00    Pack years:  45.00    Types: Cigarettes    Start date: 09/10/1966  . Smokeless tobacco: Never Used  . Tobacco comment: 12/21/16 1 PPD  Vaping Use  . Vaping Use: Never used  Substance and Sexual Activity  . Alcohol use: No    Alcohol/week: 0.0 standard drinks  . Drug use: No  . Sexual activity: Yes  Other Topics Concern  . Not on file  Social History Narrative   Patient is right handed.   Patient drinks 2 cups of caffeine daily.   Education [employer]   Social Determinants of Health   Financial Resource Strain: Not on file  Food Insecurity: Not on file  Transportation Needs: Not on file  Physical Activity: Not on file  Stress:  Not on file  Social Connections: Not on file    Review of Systems: Gen: Denies fever, chills, anorexia. Denies fatigue, weakness, weight loss.  CV: Denies chest pain, palpitations, syncope, peripheral edema, and claudication. Resp: Denies dyspnea at rest, cough, wheezing, coughing up blood, and pleurisy. GI: see HPI Derm: Denies rash, itching, dry skin Psych: Denies depression, anxiety, memory loss, confusion. No homicidal or suicidal ideation.  Heme: Denies bruising, bleeding, and enlarged lymph nodes.  Physical Exam: BP 118/76   Pulse (!) 48   Temp (!) 97 F (36.1 C)   Ht 5\' 1"  (1.549 m)   Wt 112 lb 3.2 oz (50.9 kg)   BMI 21.20 kg/m  General:   Alert and oriented. No distress noted. Pleasant and cooperative.  Head:  Normocephalic and atraumatic. Eyes:  Conjuctiva clear without scleral icterus. Mouth:  Mask in place Abdomen:  +BS, soft, non-tender and non-distended. Limited with patient in chair. Msk:  Left ankle/leg in cast after recent fall.  Extremities:  Without edema. Neurologic:  Alert and  oriented x4 Psych:  Alert and cooperative. Normal mood and affect.  Lab Results  Component Value Date   WBC 7.2 01/18/2020   HGB 11.2 (L) 01/18/2020   HCT 34.6 (L) 01/18/2020   MCV 96.6 01/18/2020   PLT 333 01/18/2020   Lab Results  Component Value  Date   FERRITIN 52 01/08/2020     ASSESSMENT: Melanie Bradley is a 70 y.o. female presenting today  with a history of acute onset of diarrhea shortly after eating at a local restaurant in Sept 2020, requiring hospitalization as she had failed outpatient supportive therapy. Cdiff negative, GI pathogen panel negative at that time and empirically treated with antibiotic therapy. She improved with supportive measures and had been doing well until around Thanksgiving, when hospitalized with Covid, dehydration, diarrhea.   Although not near her baseline, she is improving. Likely post-infectious presentation now, with diarrhea worsening in setting of COVID. Last colonoscopy in 2017 with sigmoid diverticulosis, otherwise normal. Lomotil caused drowsiness, so we will avoid this. She may use Imodium once to twice per day as needed and call if no improvement. As of note, Cdiff negative.   May need colonoscopy if no improvement over next few weeks. Weight loss noted since last visit, but she is similar to weight a year ago and tends to fluctuate around this baseline. Low threshold for colonoscopy if persistent diarrhea or weight loss.  Mild normocytic anemia noted in setting of hospitalization, acute illness. Will recheck CBC at next visit.    PLAN:  Imodium once to twice a day Continue hydrating as she is doing Call if no improvement Return in 4-6 weeks   Annitta Needs, PhD, [facility]

## 2020-03-10 NOTE — ED Provider Notes (Signed)
[place]   [id number] 03/07/20 Arrival Time: 1211  [id number] PAIN  SUBJECTIVE: History from: patient and family. Melanie Bradley is a 70 y.o. female complains of left ankle pain that began yesterday after she fell.  Reports that there is swelling to the left foot and ankle to the lateral aspect.  Has applied ice and taken Tylenol for this with some relief. Describes the pain as intermittent and achy in character. Symptoms are made worse with activity.  Denies similar symptoms in the past.  Denies fever, chills, erythema, ecchymosis, weakness, numbness and tingling, saddle paresthesias, loss of bowel or bladder function.      ROS: As per HPI.  All other pertinent ROS negative.     Past Medical History:  Diagnosis Date  . Atrial fibrillation ([place])   . Bloating   . Complication of anesthesia     " hard to wake up sometimes "  . Dementia ([place])   . Diverticula of colon   . Epigastric burning sensation   . GERD (gastroesophageal reflux disease)   . Headache disorder 06/03/2014  . Headache(784.0)   . Helicobacter pylori gastritis 2004  . Hemorrhoids 06/26/03   Dr. Laural Golden tcs  . Hiatal hernia 06/26/03   Dr. Laural Golden egd  . Hypertension   . Hypothyroidism   . 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 Memory difficulty 06/03/2014  . Shoulder fracture, left   . Spastic colon   . Wrist fracture    Past Surgical History:  Procedure Laterality Date  . APPENDECTOMY    . CHOLECYSTECTOMY    . COLONOSCOPY  06/26/03   Dr. Kem Boroughs diverticula at the sigmoid colon with one of the transverse colon, normal terminal ileoscopy, small external hemorrhoids the  . COLONOSCOPY N/A 01/20/2016   sigmoid diverticulosis, otherwise normal.   . ESOPHAGOGASTRODUODENOSCOPY  03/15/2011   Dr. Trevor Iha hiatal hernia, abnormal gastric mucosa of unclear significance. Gastric biopsies negative, no H. pylori.Procedure: ESOPHAGOGASTRODUODENOSCOPY (EGD);  Surgeon: Daneil Dolin,  MD;  [facility];  Service: Endoscopy;  Laterality: N/A;  7:30  . ORIF HUMERUS FRACTURE Left 05/29/2016   Procedure: LEFT OPEN REDUCTION INTERNAL FIXATION (ORIF) PROXIMAL HUMERUS FRACTURE;  Surgeon: Meredith Pel, MD;  Location: [place];  Service: Orthopedics;  Laterality: Left;  . ORIF WRIST FRACTURE Left 05/29/2016   Procedure: OPEN REDUCTION INTERNAL FIXATION (ORIF) LEFT WRIST FRACTURE;  Surgeon: Meredith Pel, MD;  Location: [place];  Service: Orthopedics;  Laterality: Left;  . RIGHT/LEFT HEART CATH AND CORONARY ANGIOGRAPHY N/A 04/17/2019   Procedure: RIGHT/LEFT HEART CATH AND CORONARY ANGIOGRAPHY;  Surgeon: Belva Crome, MD;  Location: [place] [facility];  Service: Cardiovascular;  Laterality: N/A;  . TUBAL LIGATION    . UPPER GASTROINTESTINAL ENDOSCOPY  06/26/03   Dr. Wynelle Bourgeois sliding hiatal hernia with 8 mm tongue of gastric type mucosa at distal esophagus bile in the stomach.   Allergies  Allergen Reactions  . Penicillins Hives and Swelling    Has patient had a PCN reaction causing IMMEDIATE RASH, FACIAL/TONGUE/THROAT SWELLING, SOB, OR LIGHTHEADEDNESS WITH HYPOTENSION:  #  #  #  YES  #  #  #  Has patient had a PCN reaction causing severe rash involving mucus membranes or skin necrosis: No Has patient had a PCN reaction that required hospitalization No Has patient had a PCN reaction occurring within the last 10 years: No If all of the above answers are "NO", then may proceed with Cephalosporin  use.   . Benadryl [Diphenhydramine Hcl] Hives  . Msm [Methylsulfonylmethane] Hives  . Nubain [Nalbuphine Hcl] Hives  . Sulfa Antibiotics Hives  . Codeine     UNSPECIFIED REACTION [IF AT ALL] REFUSES TO TAKE   Current Facility-Administered Medications on File Prior to Encounter  Medication Dose Route Frequency Provider Last Rate Last Admin  . sodium chloride flush (NS) 0.9 % injection 3 mL  3 mL Intravenous Q12H Branch, Alphonse Guild, MD       Current Outpatient  Medications on File Prior to Encounter  Medication Sig Dispense Refill  . acetaminophen (TYLENOL) 500 MG tablet Take 1,000 mg by mouth every 6 (six) hours as needed (headaches.).    Marland Kitchen alendronate (FOSAMAX) 70 MG tablet Take 70 mg by mouth every Tuesday.     Marland Kitchen aspirin EC 81 MG tablet Take 81 mg by mouth daily.    . Calcium Carb-Cholecalciferol (CALCIUM 600+D3 PO) Take 1 tablet by mouth daily with supper.    . Cyanocobalamin (B-12 PO) Take 6,000 mcg by mouth daily.    Marland Kitchen donepezil (ARICEPT) 10 MG tablet Take 10 mg by mouth at bedtime.    Marland Kitchen escitalopram (LEXAPRO) 10 MG tablet Take 10 mg by mouth daily with supper.   0  . fluocinonide cream (LIDEX) 8.45 % Apply 1 application topically 2 (two) times daily.    . megestrol (MEGACE) 20 MG tablet Take 20 mg by mouth 2 (two) times daily.    . memantine (NAMENDA) 10 MG tablet Take 1 tablet (10 mg total) by mouth 2 (two) times daily. 180 tablet 3  . pyridostigmine (MESTINON) 60 MG tablet Take 30 mg by mouth at bedtime.     Marland Kitchen Specialty Vitamins Products (BRAIN PO) Take 1 tablet by mouth daily. CogniSense Supplement    . SYNTHROID 88 MCG tablet Take 88 mcg by mouth See admin instructions. Take 1 tablet (88 mcg) by mouth on Sundays, Mondays, Tuesdays, Wednesdays,Thursdays, & Saturdays at bedtime, DOES NOT TAKE ANY Fridays.     Social History   Socioeconomic History  . Marital status: Married    Spouse name: Not on file  . Number of children: Not on file  . Years of education: 82  . Highest education level: Not on file  Occupational History  . Not on file  Tobacco Use  . Smoking status: Current Every Day Smoker    Packs/day: 1.00    Years: 45.00    Pack years: 45.00    Types: Cigarettes    Start date: 09/10/1966  . Smokeless tobacco: Never Used  . Tobacco comment: 12/21/16 1 PPD  Vaping Use  . Vaping Use: Never used  Substance and Sexual Activity  . Alcohol use: No    Alcohol/week: 0.0 standard drinks  . Drug use: No  . Sexual activity: Yes   Other Topics Concern  . Not on file  Social History Narrative   Patient is right handed.   Patient drinks 2 cups of caffeine daily.   Education [employer]   Social Determinants of Health   Financial Resource Strain: Not on file  Food Insecurity: Not on file  Transportation Needs: Not on file  Physical Activity: Not on file  Stress: Not on file  Social Connections: Not on file  Intimate Partner Violence: Not on file   Family History  Problem Relation Age of Onset  . Hypertension Mother   . Angina Mother   . COPD Father   . Heart failure Father   . Cancer  Sister   . Dementia Sister   . Cardiomyopathy Brother   . Pneumonia Other   . Migraines Sister   . Colon cancer Neg Hx     OBJECTIVE:  Vitals:   03/07/20 1306 03/07/20 1314  BP: (!) 125/91   Pulse: (!) 106   Resp: 17   Temp: 98.1 F (36.7 C)   TempSrc: Oral   SpO2: 96%   Weight:  112 lb (50.8 kg)  Height:  5\' 1"  (1.549 m)    General appearance: ALERT; in no acute distress.  Head: NCAT Lungs: Normal respiratory effort CV: pulses 2+ bilaterally. Cap refill < 2 seconds Musculoskeletal:  Inspection: Skin warm, dry, clear and intact Erythema, effusion to lateral left ankle Palpation: Lateral left ankle tender to palpation ROM: Limited ROM active and passive to left foot and ankle Skin: warm and dry Neurologic: Ambulates without difficulty; Sensation intact about the upper/ lower extremities Psychological: alert and cooperative; normal mood and affect  DIAGNOSTIC STUDIES:  No results found.   ASSESSMENT & PLAN:  1. Other closed fracture of distal end of left fibula, initial encounter   2. Pain and swelling of left ankle    X-ray today shows closed fracture of the distal end of the left fibula Splint placed today States that they have walker at home Ambulatory referral to Ortho care refill placed today If you do not hear from them, call them on Monday Continue conservative management of rest, ice,  and gentle stretches Take ibuprofen as needed for pain relief (may cause abdominal discomfort, ulcers, and GI bleeds avoid taking with other NSAIDs)  Follow up with PCP if symptoms persist Return or go to the ER if you have any new or worsening symptoms (fever, chills, chest pain, abdominal pain, changes in bowel or bladder habits, pain radiating into lower legs)  Reviewed expectations re: course of current medical issues. Questions answered. Outlined signs and symptoms indicating need for more acute intervention. Patient verbalized understanding. After Visit Summary given.       Faustino Congress, NP 03/10/20 [phone]

## 2020-03-12 ENCOUNTER — Encounter: Payer: Self-pay | Admitting: Orthopedic Surgery

## 2020-03-12 ENCOUNTER — Other Ambulatory Visit: Payer: Self-pay

## 2020-03-12 ENCOUNTER — Ambulatory Visit (INDEPENDENT_AMBULATORY_CARE_PROVIDER_SITE_OTHER): Payer: Medicare HMO | Admitting: Orthopedic Surgery

## 2020-03-12 VITALS — BP 126/89 | HR 104 | Ht 61.0 in | Wt 111.0 lb

## 2020-03-12 DIAGNOSIS — W19XXXA Unspecified fall, initial encounter: Secondary | ICD-10-CM

## 2020-03-12 DIAGNOSIS — S82839A Other fracture of upper and lower end of unspecified fibula, initial encounter for closed fracture: Secondary | ICD-10-CM | POA: Diagnosis not present

## 2020-03-12 NOTE — Patient Instructions (Signed)
Instructions  1.  You have sustained an ankle sprain; small fracture that can be treated like an ankle sprain 2.  I encourage you to stay on your feet and gradually remove your walking boot as tolerated.   3.  Below are some exercises that you can complete on your own to improve your symptoms.  4.  As an alternative, you can search for ankle sprain exercises online, and can see some demonstrations on YouTube  5.  If you are having difficulty with these exercises, we can also prescribe formal physical therapy  Ankle Exercises Ask your health care provider which exercises are safe for you. Do exercises exactly as told by your health care provider and adjust them as directed. It is normal to feel mild stretching, pulling, tightness, or mild discomfort as you do these exercises. Stop right away if you feel sudden pain or your pain gets worse. Do not begin these exercises until told by your health care provider.  Stretching and range-of-motion exercises These exercises warm up your muscles and joints and improve the movement and flexibility of your ankle. These exercises may also help to relieve pain.  Dorsiflexion/plantar flexion  1. Sit with your L knee straight or bent. Do not rest your foot on anything. 2. Flex your left ankle to tilt the top of your foot toward your shin. This is called dorsiflexion. 3. Hold this position for 5 seconds. 4. Point your toes downward to tilt the top of your foot away from your shin. This is called plantar flexion. 5. Hold this position for 5 seconds. Repeat 10 times. Complete this exercise 2-3 times a day.  As tolerated  Ankle alphabet  1. Sit with your L foot supported at your lower leg. ? Do not rest your foot on anything. ? Make sure your foot has room to move freely. 2. Think of your L foot as a paintbrush: ? Move your foot to trace each letter of the alphabet in the air. Keep your hip and knee still while you trace the letters. Trace every letter from  A to Z. ? Make the letters as large as you can without causing or increasing any discomfort.  Repeat 2-3 times. Complete this exercise 2-3 times a day.   Strengthening exercises These exercises build strength and endurance in your ankle. Endurance is the ability to use your muscles for a long time, even after they get tired. Dorsiflexors These are muscles that lift your foot up. 1. Secure a rubber exercise band or tube to an object, such as a table leg, that will stay still when the band is pulled. Secure the other end around your L foot. 2. Sit on the floor, facing the object with your L leg extended. The band or tube should be slightly tense when your foot is relaxed. 3. Slowly flex your L ankle and toes to bring your foot toward your shin. 4. Hold this position for 5 seconds. 5. Slowly return your foot to the starting position, controlling the band as you do that. Repeat 10 times. Complete this exercise 2-3 times a day.  Plantar flexors These are muscles that push your foot down. 1. Sit on the floor with your L leg extended. 2. Loop a rubber exercise band or tube around the ball of your L foot. The ball of your foot is on the walking surface, right under your toes. The band or tube should be slightly tense when your foot is relaxed. 3. Slowly point your toes downward,  pushing them away from you. 4. Hold this position for 5 seconds. 5. Slowly release the tension in the band or tube, controlling smoothly until your foot is back in the starting position. Repeat 10 times. Complete this exercise 2-3 times a day.  Towel curls  1. Sit in a chair on a non-carpeted surface, and put your feet on the floor. 2. Place a towel in front of your feet. 3. Keeping your heel on the floor, put your L foot on the towel. 4. Pull the towel toward you by grabbing the towel with your toes and curling them under. Keep your heel on the floor. 5. Let your toes relax. 6. Grab the towel again. Keep pulling the  towel until it is completely underneath your foot. Repeat 10 times. Complete this exercise 2-3 times a day.  Standing plantar flexion This is an exercise in which you use your toes to lift your body's weight while standing. 1. Stand with your feet shoulder-width apart. 2. Keep your weight spread evenly over the width of your feet while you rise up on your toes. Use a wall or table to steady yourself if needed, but try not to use it for support. 3. If this exercise is too easy, try these options: ? Shift your weight toward your L leg until you feel challenged. ? If told by your health care provider, lift your uninjured leg off the floor. 4. Hold this position for 5 seconds. Repeat 10 times. Complete this exercise 2-3 times a day.  Tandem walking 1. Stand with one foot directly in front of the other. 2. Slowly raise your back foot up, lifting your heel before your toes, and place it directly in front of your other foot. 3. Continue to walk in this heel-to-toe way. Have a countertop or wall nearby to use if needed to keep your balance, but try not to hold onto anything for support.  Repeat 10 times. Complete this exercise 2-3 times a day.   Document Revised: 10/27/2017 Document Reviewed: 10/29/2017 Elsevier Patient Education  [place].

## 2020-03-12 NOTE — Progress Notes (Signed)
New Patient Visit  Assessment: Melanie Bradley is a 70 y.o. female with the following: Left distal fibula avulsion fracture   Plan: This represents a stable injury.  Do not need to continue with full immobilization.  She was transition to a walking boot in clinic today.  I provided her with ankle sprain exercises for her to initiate range of motion.  She can start with weightbearing as tolerated, understanding that she may need to continue to use the knee Rollator.  Follow-up in 4 weeks for repeat evaluation including x-rays.   Follow-up: Return in about 4 weeks (around 04/09/2020).  Subjective:  Chief Complaint  Patient presents with  . Foot Injury    Had a fall on 03/06/20. Pt reports she is not having pain.,     History of Present Illness: MADALYNE Bradley is a 70 y.o. female who presents for evaluation of her left ankle.  She reported falling a few days ago, while getting out of the car.  She rolled her ankle.  She was seen at a local urgent care facility, where x-rays demonstrated a small distal avulsion fracture.  She was placed in a splint, and told to remain nonweightbearing.  She has done well up putting weight on her left ankle.  The pain continues to improve.   Review of Systems: No fevers or chills No numbness or tingling No chest pain No shortness of breath No bowel or bladder dysfunction No GI distress No headaches   Medical History:  Past Medical History:  Diagnosis Date  . Atrial fibrillation ([place])   . Bloating   . Complication of anesthesia     " hard to wake up sometimes "  . Dementia ([place])   . Diverticula of colon   . Epigastric burning sensation   . GERD (gastroesophageal reflux disease)   . Headache disorder 06/03/2014  . Headache(784.0)   . Helicobacter pylori gastritis 2004  . Hemorrhoids 06/26/03   Dr. Laural Golden tcs  . Hiatal hernia 06/26/03   Dr. Laural Golden egd  . Hypertension   . Hypothyroidism   . Left bundle branch block    rate dependent  LBBB 04/2013  . Memory difficulty 06/03/2014  . Shoulder fracture, left   . Spastic colon   . Wrist fracture     Past Surgical History:  Procedure Laterality Date  . APPENDECTOMY    . CHOLECYSTECTOMY    . COLONOSCOPY  06/26/03   Dr. Kem Boroughs diverticula at the sigmoid colon with one of the transverse colon, normal terminal ileoscopy, small external hemorrhoids the  . COLONOSCOPY N/A 01/20/2016   sigmoid diverticulosis, otherwise normal.   . ESOPHAGOGASTRODUODENOSCOPY  03/15/2011   Dr. Trevor Iha hiatal hernia, abnormal gastric mucosa of unclear significance. Gastric biopsies negative, no H. pylori.Procedure: ESOPHAGOGASTRODUODENOSCOPY (EGD);  Surgeon: Daneil Dolin, MD;  [facility];  Service: Endoscopy;  Laterality: N/A;  7:30  . ORIF HUMERUS FRACTURE Left 05/29/2016   Procedure: LEFT OPEN REDUCTION INTERNAL FIXATION (ORIF) PROXIMAL HUMERUS FRACTURE;  Surgeon: Meredith Pel, MD;  Location: [facility];  Service: Orthopedics;  Laterality: Left;  . ORIF WRIST FRACTURE Left 05/29/2016   Procedure: OPEN REDUCTION INTERNAL FIXATION (ORIF) LEFT WRIST FRACTURE;  Surgeon: Meredith Pel, MD;  Location: [facility];  Service: Orthopedics;  Laterality: Left;  . RIGHT/LEFT HEART CATH AND CORONARY ANGIOGRAPHY N/A 04/17/2019   Procedure: RIGHT/LEFT HEART CATH AND CORONARY ANGIOGRAPHY;  Surgeon: Belva Crome, MD;  Location: [facility];  Service: Cardiovascular;  Laterality: N/A;  .  TUBAL LIGATION    . UPPER GASTROINTESTINAL ENDOSCOPY  06/26/03   Dr. Wynelle Bourgeois sliding hiatal hernia with 8 mm tongue of gastric type mucosa at distal esophagus bile in the stomach.    Family History  Problem Relation Age of Onset  . Hypertension Mother   . Angina Mother   . COPD Father   . Heart failure Father   . Cancer Sister   . Dementia Sister   . Cardiomyopathy Brother   . Pneumonia Other   . Migraines Sister   . Colon cancer Neg Hx    Social History   Tobacco Use  . Smoking  status: Current Every Day Smoker    Packs/day: 1.00    Years: 45.00    Pack years: 45.00    Types: Cigarettes    Start date: 09/10/1966  . Smokeless tobacco: Never Used  . Tobacco comment: 12/21/16 1 PPD  Vaping Use  . Vaping Use: Never used  Substance Use Topics  . Alcohol use: No    Alcohol/week: 0.0 standard drinks  . Drug use: No    Allergies  Allergen Reactions  . Penicillins Hives and Swelling    Has patient had a PCN reaction causing IMMEDIATE RASH, FACIAL/TONGUE/THROAT SWELLING, SOB, OR LIGHTHEADEDNESS WITH HYPOTENSION:  #  #  #  YES  #  #  #  Has patient had a PCN reaction causing severe rash involving mucus membranes or skin necrosis: No Has patient had a PCN reaction that required hospitalization No Has patient had a PCN reaction occurring within the last 10 years: No If all of the above answers are "NO", then may proceed with Cephalosporin use.   . Benadryl [Diphenhydramine Hcl] Hives  . Msm [Methylsulfonylmethane] Hives  . Nubain [Nalbuphine Hcl] Hives  . Sulfa Antibiotics Hives  . Codeine     UNSPECIFIED REACTION [IF AT ALL] REFUSES TO TAKE    Current Meds  Medication Sig  . acetaminophen (TYLENOL) 500 MG tablet Take 1,000 mg by mouth every 6 (six) hours as needed (headaches.).  Marland Kitchen alendronate (FOSAMAX) 70 MG tablet Take 70 mg by mouth every Tuesday.   Marland Kitchen aspirin EC 81 MG tablet Take 81 mg by mouth daily.  . Calcium Carb-Cholecalciferol (CALCIUM 600+D3 PO) Take 1 tablet by mouth daily with supper.  . Cyanocobalamin (B-12 PO) Take 6,000 mcg by mouth daily.  Marland Kitchen donepezil (ARICEPT) 10 MG tablet Take 10 mg by mouth at bedtime.  Marland Kitchen escitalopram (LEXAPRO) 10 MG tablet Take 10 mg by mouth daily with supper.   . fluocinonide cream (LIDEX) [id number] % Apply 1 application topically 2 (two) times daily.  . megestrol (MEGACE) 20 MG tablet Take 20 mg by mouth 2 (two) times daily.  . memantine (NAMENDA) 10 MG tablet Take 1 tablet (10 mg total) by mouth 2 (two) times daily.  Marland Kitchen  pyridostigmine (MESTINON) 60 MG tablet Take 30 mg by mouth at bedtime.   Marland Kitchen Specialty Vitamins Products (BRAIN PO) Take 1 tablet by mouth daily. CogniSense Supplement  . SYNTHROID 88 MCG tablet Take 88 mcg by mouth See admin instructions. Take 1 tablet (88 mcg) by mouth on Sundays, Mondays, Tuesdays, Wednesdays,Thursdays, & Saturdays at bedtime, DOES NOT TAKE ANY Fridays.   Current Facility-Administered Medications for the 03/12/20 encounter (Office Visit) with Mordecai Rasmussen, MD  Medication  . sodium chloride flush (NS) 0.9 % injection 3 mL    Objective: BP 126/89   Pulse (!) 104   Ht 5\' 1"  (1.549 m)   Wt 111  lb (50.3 kg)   BMI 20.97 kg/m   Physical Exam:  General: Alert and oriented.  No acute distress.  Mild dementia Gait: Unable to ambulate.  Uses a knee Rollator.  Evaluation of left ankle demonstrates significant swelling of the lateral ankle.  Ecchymosis is appreciated in dependent position including the posterior and inferior to the lateral malleolus.  She also has some ecchymosis over her toes.  2+ DP pulse.  Limited active motion due to pain.  Easily gets to a plantigrade position without discomfort.  Sensation is intact over the dorsal aspect of her foot.  IMAGING: I personally reviewed images previously obtained from the ED  Small distal avulsion fracture off the left distal fibula  New Medications:  No orders of the defined types were placed in this encounter.     Mordecai Rasmussen, MD  03/12/2020 10:20 AM

## 2020-03-15 ENCOUNTER — Other Ambulatory Visit (HOSPITAL_COMMUNITY)
Admission: RE | Admit: 2020-03-15 | Discharge: 2020-03-15 | Disposition: A | Discharge status: Home / Self Care | Payer: Medicare HMO | Source: Ambulatory Visit | Attending: Cardiology | Admitting: Cardiology

## 2020-04-02 ENCOUNTER — Ambulatory Visit: Payer: Medicare HMO | Admitting: Orthopedic Surgery

## 2020-04-05 ENCOUNTER — Ambulatory Visit: Payer: Medicare HMO | Admitting: Cardiology

## 2020-04-05 ENCOUNTER — Encounter: Payer: Self-pay | Admitting: Cardiology

## 2020-04-05 VITALS — BP 130/90 | HR 88 | Ht 61.0 in | Wt 120.0 lb

## 2020-04-05 DIAGNOSIS — I5022 Chronic systolic (congestive) heart failure: Secondary | ICD-10-CM

## 2020-04-05 MED ORDER — METOPROLOL SUCCINATE ER 25 MG PO TB24: 12.5000 mg | ORAL_TABLET | Freq: Every day | ORAL | 1 refills | Status: DC

## 2020-04-05 NOTE — Progress Notes (Signed)
Clinical Summary Ms. Ploch is a 70 y.o.female seen today for follow up of the following medical problems.   1. Chronic systolic HF - new diagnosis during 10/2018 admission with syncope. Echo showed LVEF 35-40% - no recent SOb/DOE. Compliant with meds - bp is 114/75, HR 64 today   Jan 2021 echo LVEF 35-40%, mild to mod MR.  04/2019 no significant CAD, normal filling pressures. CI 3.7 Mean PA 15, PCWP 7 07/2019 CMRI: LVEF 38%, normal RV. No infiltrative disease    -has had recent long term GI issues, partly chronic and partly exacerbated by recent COVID infection. Has lost 13 lbs, chronic intermittent diarrhea that has led to low bp's and intermittent issues with dehydration. Recent admission with dehydration, significant AKI - off all CHF meds due to low bp's, recent AKI   - GI issues have improved, appetite and weight increasnig. Bp's have trended upward back to prior baseline.     2.ChronicLBBB - no significant CAD by cath 04/2019    3. Memory deficit/Dementia - on aricept, followed by neuro -remains active, independent. Enjoys doing yard and housework.   4. COVID 19 - admitted 12/2019 with COVID, primarily GI/diarrhea symptoms. Presented dehydrated with AKI on CKD -still with decreased oral intake, soft bp's at home. Husband working to encourage fluids.  - intermittent diarrhea ongoing. 2-3 times loose watery stools.  - increasing appetite but still not back at baseline, started on megace  5. CKD - recent admission 01/2020 with AKI on CKD, Cr up to 4.5 in setting of deyhdyration in setting of ongoing diarrhea related to COVid enteritis - entresto held  6. Distal fibula fracture - occurred after recent fall   Past Medical History:  Diagnosis Date  . Atrial fibrillation ([place])   . Bloating   . Complication of anesthesia     " hard to wake up sometimes "  . Dementia ([place])   . Diverticula of colon   . Epigastric burning  sensation   . GERD (gastroesophageal reflux disease)   . Headache disorder 06/03/2014  . Headache(784.0)   . Helicobacter pylori gastritis 2004  . Hemorrhoids 06/26/03   Dr. Laural Golden tcs  . Hiatal hernia 06/26/03   Dr. Laural Golden egd  . Hypertension   . Hypothyroidism   . Left bundle Martice Doty block    rate dependent LBBB 04/2013  . Memory difficulty 06/03/2014  . Shoulder fracture, left   . Spastic colon   . Wrist fracture      Allergies  Allergen Reactions  . Penicillins Hives and Swelling    Has patient had a PCN reaction causing IMMEDIATE RASH, FACIAL/TONGUE/THROAT SWELLING, SOB, OR LIGHTHEADEDNESS WITH HYPOTENSION:  #  #  #  YES  #  #  #  Has patient had a PCN reaction causing severe rash involving mucus membranes or skin necrosis: No Has patient had a PCN reaction that required hospitalization No Has patient had a PCN reaction occurring within the last 10 years: No If all of the above answers are "NO", then may proceed with Cephalosporin use.   . Benadryl [Diphenhydramine Hcl] Hives  . Msm [Methylsulfonylmethane] Hives  . Nubain [Nalbuphine Hcl] Hives  . Sulfa Antibiotics Hives  . Codeine     UNSPECIFIED REACTION [IF AT ALL] REFUSES TO TAKE     Current Outpatient Medications  Medication Sig Dispense Refill  . acetaminophen (TYLENOL) 500 MG tablet Take 1,000 mg by mouth every 6 (six) hours as needed (headaches.).    Marland Kitchen alendronate (  FOSAMAX) 70 MG tablet Take 70 mg by mouth every Tuesday.     Marland Kitchen aspirin EC 81 MG tablet Take 81 mg by mouth daily.    . Calcium Carb-Cholecalciferol (CALCIUM 600+D3 PO) Take 1 tablet by mouth daily with supper.    . Cyanocobalamin (B-12 PO) Take 6,000 mcg by mouth daily.    Marland Kitchen donepezil (ARICEPT) 10 MG tablet Take 10 mg by mouth at bedtime.    Marland Kitchen escitalopram (LEXAPRO) 10 MG tablet Take 10 mg by mouth daily with supper.   0  . fluocinonide cream (LIDEX) 3.01 % Apply 1 application topically 2 (two) times daily.    . megestrol (MEGACE) 20 MG tablet Take  20 mg by mouth 2 (two) times daily.    . memantine (NAMENDA) 10 MG tablet Take 1 tablet (10 mg total) by mouth 2 (two) times daily. 180 tablet 3  . pyridostigmine (MESTINON) 60 MG tablet Take 30 mg by mouth at bedtime.     Marland Kitchen Specialty Vitamins Products (BRAIN PO) Take 1 tablet by mouth daily. CogniSense Supplement    . SYNTHROID 88 MCG tablet Take 88 mcg by mouth See admin instructions. Take 1 tablet (88 mcg) by mouth on Sundays, Mondays, Tuesdays, Wednesdays,Thursdays, & Saturdays at bedtime, DOES NOT TAKE ANY Fridays.     Current Facility-Administered Medications  Medication Dose Route Frequency Provider Last Rate Last Admin  . sodium chloride flush (NS) 0.9 % injection 3 mL  3 mL Intravenous Q12H Jennifer Payes, Alphonse Guild, MD         Past Surgical History:  Procedure Laterality Date  . APPENDECTOMY    . CHOLECYSTECTOMY    . COLONOSCOPY  06/26/03   Dr. Kem Boroughs diverticula at the sigmoid colon with one of the transverse colon, normal terminal ileoscopy, small external hemorrhoids the  . COLONOSCOPY N/A 01/20/2016   sigmoid diverticulosis, otherwise normal.   . ESOPHAGOGASTRODUODENOSCOPY  03/15/2011   Dr. Trevor Iha hiatal hernia, abnormal gastric mucosa of unclear significance. Gastric biopsies negative, no H. pylori.Procedure: ESOPHAGOGASTRODUODENOSCOPY (EGD);  Surgeon: Daneil Dolin, MD;  [facility];  Service: Endoscopy;  Laterality: N/A;  7:30  . ORIF HUMERUS FRACTURE Left 05/29/2016   Procedure: LEFT OPEN REDUCTION INTERNAL FIXATION (ORIF) PROXIMAL HUMERUS FRACTURE;  Surgeon: Meredith Pel, MD;  Location: [facility];  Service: Orthopedics;  Laterality: Left;  . ORIF WRIST FRACTURE Left 05/29/2016   Procedure: OPEN REDUCTION INTERNAL FIXATION (ORIF) LEFT WRIST FRACTURE;  Surgeon: Meredith Pel, MD;  Location: [facility];  Service: Orthopedics;  Laterality: Left;  . RIGHT/LEFT HEART CATH AND CORONARY ANGIOGRAPHY N/A 04/17/2019   Procedure: RIGHT/LEFT HEART CATH AND CORONARY  ANGIOGRAPHY;  Surgeon: Belva Crome, MD;  Location: [facility];  Service: Cardiovascular;  Laterality: N/A;  . TUBAL LIGATION    . UPPER GASTROINTESTINAL ENDOSCOPY  06/26/03   Dr. Wynelle Bourgeois sliding hiatal hernia with 8 mm tongue of gastric type mucosa at distal esophagus bile in the stomach.     Allergies  Allergen Reactions  . Penicillins Hives and Swelling    Has patient had a PCN reaction causing IMMEDIATE RASH, FACIAL/TONGUE/THROAT SWELLING, SOB, OR LIGHTHEADEDNESS WITH HYPOTENSION:  #  #  #  YES  #  #  #  Has patient had a PCN reaction causing severe rash involving mucus membranes or skin necrosis: No Has patient had a PCN reaction that required hospitalization No Has patient had a PCN reaction occurring within the last 10 years: No If all of the above answers  are "NO", then may proceed with Cephalosporin use.   . Benadryl [Diphenhydramine Hcl] Hives  . Msm [Methylsulfonylmethane] Hives  . Nubain [Nalbuphine Hcl] Hives  . Sulfa Antibiotics Hives  . Codeine     UNSPECIFIED REACTION [IF AT ALL] REFUSES TO TAKE      Family History  Problem Relation Age of Onset  . Hypertension Mother   . Angina Mother   . COPD Father   . Heart failure Father   . Cancer Sister   . Dementia Sister   . Cardiomyopathy Brother   . Pneumonia Other   . Migraines Sister   . Colon cancer Neg Hx      Social History Ms. Vanvoorhis reports that she has been smoking cigarettes. She started smoking about 53 years ago. She has a 45.00 pack-year smoking history. She has never used smokeless tobacco. Ms. Frappier reports no history of alcohol use.   Review of Systems CONSTITUTIONAL: No weight loss, fever, chills, weakness or fatigue.  HEENT: Eyes: No visual loss, blurred vision, double vision or yellow sclerae.No hearing loss, sneezing, congestion, runny nose or sore throat.  SKIN: No rash or itching.  CARDIOVASCULAR: per hpi RESPIRATORY: No shortness of breath, cough or sputum.   GASTROINTESTINAL: No anorexia, nausea, vomiting or diarrhea. No abdominal pain or blood.  GENITOURINARY: No burning on urination, no polyuria NEUROLOGICAL: No headache, dizziness, syncope, paralysis, ataxia, numbness or tingling in the extremities. No change in bowel or bladder control.  MUSCULOSKELETAL: No muscle, back pain, joint pain or stiffness.  LYMPHATICS: No enlarged nodes. No history of splenectomy.  PSYCHIATRIC: No history of depression or anxiety.  ENDOCRINOLOGIC: No reports of sweating, cold or heat intolerance. No polyuria or polydipsia.  Marland Kitchen   Physical Examination Today's Vitals   04/05/20 1314  BP: 130/90  Pulse: 88  SpO2: 98%  Weight: 120 lb (54.4 kg)  Height: 5\' 1"  (1.549 m)   Body mass index is 22.67 kg/m.  Gen: resting comfortably, no acute distress HEENT: no scleral icterus, pupils equal round and reactive, no palptable cervical adenopathy,  CV: RRR, no m/r/g, no jvd Resp: Clear to auscultation bilaterally GI: abdomen is soft, non-tender, non-distended, normal bowel sounds, no hepatosplenomegaly MSK: extremities are warm, no edema.  Skin: warm, no rash Neuro:  no focal deficits Psych: appropriate affect   Diagnostic Studies 04/2013 Lexiscan MPI FINDINGS:  The patient's initial EKG revealed a narrow QRS. After she received  Lexiscan, the heart rate increased to 145 and the QRS widened to a  left bundle Javiel Canepa block. The increased rate persisted for several  minutes but then began to slow. When the heart rate was slower, P  waves could be seen. There were no flutter waves. The raw data  reveals no evidence of excess motion. Tomographic images with stress  reveal a small area of moderate decreased uptake in the septum. Rest  images reveal a moderate area of decreased uptake in the septum.  These findings are most consistent with left bundle Arryanna Holquin block.  Wall motion analysis reveals excellent motion with an ejection  fraction of 70%.   IMPRESSION:  This is a low risk scan. There is no scar or ischemia. There is  decreased activity in the septum, consistent with left bundle Ralyn Stlaurent  block. It is noted that the QRS was narrow at the start of the  study. However with Lexiscan, the patient developed tachycardia with  left bundle Jaxn Chiquito block. Assessment shows that this was most  probably sinus tachycardia which slowed over  time.  10/2018 echo IMPRESSIONS   1. The left ventricle has moderately reduced systolic function, with an ejection fraction of 35-40%. The cavity size was normal. Severe focal basal septal hypertrophy.. Diastolic dysfunction, grade indeterminate. Left ventricular diffuse hypokinesis. 2. The right ventricle has normal systolic function. The cavity was normal. There is no increase in right ventricular wall thickness. 3. The aortic valve is tricuspid. Mild thickening of the aortic valve. Mild aortic annular calcification noted. 4. The mitral valve is grossly normal. Mild thickening of the mitral valve leaflet. Mitral valve regurgitation is mild to moderate by color flow Doppler. 5. The tricuspid valve is grossly normal. 6. The aorta is normal unless otherwise noted.  12/2018 30 day event monitor  30 day event monitor  Min HR 57, Max HR 130, Avg HR 79  No symptoms reported  Telemetry tracings show sinus rhythm with occasional PVCs.  No significant arrhythmias  Jan 2021 echo IMPRESSIONS    1. Left ventricular ejection fraction, by visual estimation, is 35 to  40%. The left ventricle has moderately decreased function. There is no  left ventricular hypertrophy.  2. Left ventricular diastolic parameters are indeterminate.  3. The left ventricle demonstrates global hypokinesis.  4. Global right ventricle has normal systolic function.The right  ventricular size is normal. No increase in right ventricular wall  thickness.  5. Left atrial size was normal.  6. Right atrial size  was normal.  7. The mitral valve is normal in structure. Mild to moderate mitral valve  regurgitation. No evidence of mitral stenosis.  8. The tricuspid valve is normal in structure.  9. The aortic valve has an indeterminant number of cusps. Aortic valve  regurgitation is not visualized. No evidence of aortic valve sclerosis or  stenosis.  10. The pulmonic valve was not well visualized. Pulmonic valve  regurgitation is not visualized.  11. The inferior vena cava is normal in size with greater than 50%  respiratory variability, suggesting right atrial pressure of 3 mmHg.     04/2019 RHC/LHC  Normal right heart pressures. Cardiac output 3.7 L/min. Cardiac index 2.5 L/min/m  Normal left main  Transapical LAD. First diagonal contains segmental proximal 40% narrowing.  Normal small ramus.  Circumflex is normal.  The right coronary demonstrates generalized minimal atherosclerosis. No focal stenosis.  Anterior and apical mild to moderate hypokinesis. Estimated ejection fraction 35 to 40%.  RECOMMENDATIONS:   Guideline directed therapy for left ventricular systolic dysfunction in the setting of left bundle Yamileth Hayse block. If no improvement in LV function with therapy, consider resync.     Assessment and Plan   1. Chronic systolic HF -off all CHF meds due to recent issues with low bp's due to chronic diarrhea - GI symptoms resolving, bp's have increased - restart toprol 12.5mg  daily. If toelrates would then start back her entresto 24/26mg  bid, followed by her aldactone 12.5mg  daily. Family will call us in 1 week with home bp's - no symptoms     Arnoldo Lenis, M.D.

## 2020-04-05 NOTE — Patient Instructions (Signed)
Medication Instructions:   Your physician has recommended you make the following change in your medication:   Start metoprolol succinate (toprol xl) 12.5 mg by mouth daily  Continue other medications the same  Labwork:  none  Testing/Procedures:  none  Follow-Up:  Your physician recommends that you schedule a follow-up appointment in: 4 months at the [place] office.   Any Other Special Instructions Will Be Listed Below (If Applicable).  Check your blood pressure and heart rate at home for one week and call with your readings.   If you need a refill on your cardiac medications before your next appointment, please call your pharmacy.

## 2020-04-09 ENCOUNTER — Ambulatory Visit (INDEPENDENT_AMBULATORY_CARE_PROVIDER_SITE_OTHER): Payer: Medicare HMO | Admitting: Orthopedic Surgery

## 2020-04-09 ENCOUNTER — Ambulatory Visit: Payer: Medicare HMO

## 2020-04-09 ENCOUNTER — Encounter: Payer: Self-pay | Admitting: Orthopedic Surgery

## 2020-04-09 ENCOUNTER — Other Ambulatory Visit: Payer: Self-pay

## 2020-04-09 DIAGNOSIS — E230 Hypopituitarism: Secondary | ICD-10-CM | POA: Insufficient documentation

## 2020-04-09 DIAGNOSIS — S82839A Other fracture of upper and lower end of unspecified fibula, initial encounter for closed fracture: Secondary | ICD-10-CM

## 2020-04-09 DIAGNOSIS — E559 Vitamin D deficiency, unspecified: Secondary | ICD-10-CM | POA: Insufficient documentation

## 2020-04-09 DIAGNOSIS — S82832D Other fracture of upper and lower end of left fibula, subsequent encounter for closed fracture with routine healing: Secondary | ICD-10-CM | POA: Diagnosis not present

## 2020-04-09 DIAGNOSIS — D4410 Neoplasm of uncertain behavior of unspecified adrenal gland: Secondary | ICD-10-CM | POA: Insufficient documentation

## 2020-04-09 DIAGNOSIS — M9390 Osteochondropathy, unspecified of unspecified site: Secondary | ICD-10-CM | POA: Insufficient documentation

## 2020-04-09 NOTE — Progress Notes (Signed)
Orthopaedic Clinic Return  Assessment: Melanie Bradley is a 70 y.o. female with the following: Avulsion fracture of left distal fibula  Plan: Melanie Bradley has done well with her injury.  Based on radiographs, there is no additional injury noted at this time.  The avulsion fragment is healing appropriately.  She can transition into a regular shoe.  Continue with weightbearing as tolerated.  No further follow-up is needed, they can contact the clinic if they have any further issues.   Follow-up: Return if symptoms worsen or fail to improve.   Subjective:  Chief Complaint  Patient presents with  . Ankle Injury    Left ankle fracture     History of Present Illness: Melanie Bradley is a 70 y.o. female who returns to clinic today for repeat evaluation of her left ankle.  She sustained a distal left fibula avulsion fracture approximately 1 month ago.  She has continued to use the walking boot, but according to her husband, she has taken the boot off and walked a little bit in the house.  They have noticed some residual swelling from time to time.  She is not taking any medications.  No additional injuries noted.  Review of Systems: No fevers or chills No numbness or tingling No chest pain No shortness of breath No bowel or bladder dysfunction No GI distress No headaches  Objective: There were no vitals taken for this visit.  Physical Exam:  Alert and oriented.  No acute distress.  Some confusion, which is baseline for her.  Evaluation of the left ankle demonstrates mild residual swelling over the lateral aspect of her ankle.  There is no tenderness to palpation at the distal fibula.  She has full and painless range of motion of the ankle.  She tolerates inversion at the ankle.  Toes are warm and well-perfused.  Sensation is intact throughout the left foot.   IMAGING: I personally ordered and reviewed the following images:   X-ray left ankle demonstrates no acute injuries.   The mortise remains intact.  There is a small avulsion fragment within the lateral gutter.  Bone appears to be osteopenic.  Impression: Healing left distal fibula avulsion fracture, in stable position without interval displacement.  Mordecai Rasmussen, MD 04/09/2020 10:11 AM

## 2020-04-13 ENCOUNTER — Telehealth: Payer: Self-pay | Admitting: Cardiology

## 2020-04-13 NOTE — Telephone Encounter (Signed)
I will forward to Dr.Branch for review. 

## 2020-04-13 NOTE — Telephone Encounter (Signed)
New message    Patient husband calling in her blood pressure reading for the week    131/93 hr 82 127/90 hr 84 133/92 hr 81

## 2020-04-14 ENCOUNTER — Other Ambulatory Visit: Payer: Self-pay

## 2020-04-14 ENCOUNTER — Other Ambulatory Visit (HOSPITAL_COMMUNITY)
Admission: RE | Admit: 2020-04-14 | Discharge: 2020-04-14 | Disposition: A | Discharge status: Home / Self Care | Payer: Medicare HMO | Source: Ambulatory Visit | Attending: Cardiology | Admitting: Cardiology

## 2020-04-14 DIAGNOSIS — Z79899 Other long term (current) drug therapy: Secondary | ICD-10-CM

## 2020-04-14 LAB — BASIC METABOLIC PANEL
Anion gap: 9 (ref 5–15)
BUN: 18 mg/dL (ref 8–23)
CO2: 25 mmol/L (ref 22–32)
Calcium: 8.6 mg/dL — ABNORMAL LOW (ref 8.9–10.3)
Chloride: 99 mmol/L (ref 98–111)
Creatinine, Ser: 1.49 mg/dL — ABNORMAL HIGH (ref 0.44–1.00)
GFR, Estimated: 38 mL/min — ABNORMAL LOW (ref >60)
Glucose, Bld: 97 mg/dL (ref 70–99)
Potassium: 3.8 mmol/L (ref 3.5–5.1)
Sodium: 133 mmol/L — ABNORMAL LOW (ref 135–145)

## 2020-04-14 NOTE — Telephone Encounter (Signed)
Id like to restart her entresto but would need some blood first. Can she get a bmet this week  Zandra Abts MD

## 2020-04-14 NOTE — Telephone Encounter (Signed)
Patient will be coming this week for BMET. Orders placed.

## 2020-04-19 ENCOUNTER — Other Ambulatory Visit: Payer: Self-pay | Admitting: Adult Health

## 2020-04-22 ENCOUNTER — Encounter: Payer: Self-pay | Admitting: Gastroenterology

## 2020-04-22 ENCOUNTER — Other Ambulatory Visit: Payer: Self-pay

## 2020-04-22 ENCOUNTER — Ambulatory Visit: Payer: Medicare HMO | Admitting: Gastroenterology

## 2020-04-22 VITALS — BP 118/92 | HR 80 | Temp 96.6°F | Ht 61.0 in | Wt 115.8 lb

## 2020-04-22 DIAGNOSIS — K529 Noninfective gastroenteritis and colitis, unspecified: Secondary | ICD-10-CM | POA: Diagnosis not present

## 2020-04-22 DIAGNOSIS — D649 Anemia, unspecified: Secondary | ICD-10-CM | POA: Insufficient documentation

## 2020-04-22 LAB — CBC WITH DIFFERENTIAL/PLATELET
Absolute Monocytes: 1113 cells/uL — ABNORMAL HIGH (ref 200–950)
Basophils Absolute: 104 cells/uL (ref 0–200)
Basophils Relative: 1 %
Eosinophils Absolute: 489 cells/uL (ref 15–500)
Eosinophils Relative: 4.7 %
HCT: 36.3 % (ref 35.0–45.0)
Hemoglobin: 11.8 g/dL (ref 11.7–15.5)
Lymphs Abs: 2236 cells/uL (ref 850–3900)
MCH: 30.9 pg (ref 27.0–33.0)
MCHC: 32.5 g/dL (ref 32.0–36.0)
MCV: 95 fL (ref 80.0–100.0)
MPV: 9.9 fL (ref 7.5–12.5)
Monocytes Relative: 10.7 %
Neutro Abs: 6458 cells/uL (ref 1500–7800)
Neutrophils Relative %: 62.1 %
Platelets: 483 10*3/uL — ABNORMAL HIGH (ref 140–400)
RBC: 3.82 10*6/uL (ref 3.80–5.10)
RDW: 13 % (ref 11.0–15.0)
Total Lymphocyte: 21.5 %
WBC: 10.4 10*3/uL (ref 3.8–10.8)

## 2020-04-22 NOTE — Progress Notes (Signed)
Referring Provider: Sharilyn Sites, MD Primary Care Physician:  Sharilyn Sites, MD Primary GI: Dr. Gala Romney    Chief Complaint  Patient presents with  . Diarrhea    Doing better    HPI:   Melanie Bradley is a 70 y.o. female presenting today with a history of acute onset of diarrhea shortly after eating at a local restaurant in Sept 2020, requiring hospitalization as she had failed outpatient supportive therapy. Cdiff negative, GI pathogen panel negative. Empirically treated with antibiotic therapy. TSH wasnormal. Last colonoscopy in 2017 with sigmoid diverticulosis, otherwise normal. 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  Doing well today on Imodium BID. No abdominal pain, rectal bleeding, changes in appetite. States she feels great and has lots of energy. Weight staying within her baseline. No dysphagia. Husband present.   Mild normocytic anemia noted during acute illness at time of hospitalization in Dec 2021. Will recheck today.   Past Medical History:  Diagnosis Date  . Atrial fibrillation ([place])   . Bloating   . Complication of anesthesia     " hard to wake up sometimes "  . Dementia ([place])   . Diverticula of colon   . Epigastric burning sensation   . GERD (gastroesophageal reflux disease)   . Headache disorder 06/03/2014  . Headache(784.0)   . Helicobacter pylori gastritis 2004  . Hemorrhoids 06/26/03   Dr. Laural Golden tcs  . Hiatal hernia 06/26/03   Dr. Laural Golden egd  . Hypertension   . Hypothyroidism   . 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 Memory difficulty 06/03/2014  . Shoulder fracture, left   . Spastic colon   . Wrist fracture     Past Surgical History:  Procedure Laterality Date  . APPENDECTOMY    . CHOLECYSTECTOMY    . COLONOSCOPY  06/26/03   Dr. Kem Boroughs diverticula at the sigmoid colon with one of the transverse colon, normal terminal ileoscopy, small external hemorrhoids the  . COLONOSCOPY  N/A 01/20/2016   sigmoid diverticulosis, otherwise normal.   . ESOPHAGOGASTRODUODENOSCOPY  03/15/2011   Dr. Trevor Iha hiatal hernia, abnormal gastric mucosa of unclear significance. Gastric biopsies negative, no H. pylori.Procedure: ESOPHAGOGASTRODUODENOSCOPY (EGD);  Surgeon: Daneil Dolin, MD;  [facility];  Service: Endoscopy;  Laterality: N/A;  7:30  . ORIF HUMERUS FRACTURE Left 05/29/2016   Procedure: LEFT OPEN REDUCTION INTERNAL FIXATION (ORIF) PROXIMAL HUMERUS FRACTURE;  Surgeon: Meredith Pel, MD;  Location: [place];  Service: Orthopedics;  Laterality: Left;  . ORIF WRIST FRACTURE Left 05/29/2016   Procedure: OPEN REDUCTION INTERNAL FIXATION (ORIF) LEFT WRIST FRACTURE;  Surgeon: Meredith Pel, MD;  Location: [place];  Service: Orthopedics;  Laterality: Left;  . RIGHT/LEFT HEART CATH AND CORONARY ANGIOGRAPHY N/A 04/17/2019   Procedure: RIGHT/LEFT HEART CATH AND CORONARY ANGIOGRAPHY;  Surgeon: Belva Crome, MD;  Location: [place] [facility];  Service: Cardiovascular;  Laterality: N/A;  . TUBAL LIGATION    . UPPER GASTROINTESTINAL ENDOSCOPY  06/26/03   Dr. Wynelle Bourgeois sliding hiatal hernia with 8 mm tongue of gastric type mucosa at distal esophagus bile in the stomach.    Current Outpatient Medications  Medication Sig Dispense Refill  . acetaminophen (TYLENOL) 500 MG tablet Take 1,000 mg by mouth every 6 (six) hours as needed (headaches.).    Marland Kitchen alendronate (FOSAMAX) 70 MG tablet Take 70 mg by mouth every Tuesday.     Marland Kitchen aspirin EC 81 MG tablet Take  81 mg by mouth daily.    . Calcium Carb-Cholecalciferol (CALCIUM 600+D3 PO) Take 1 tablet by mouth daily with supper.    . Cyanocobalamin (B-12 PO) Take 6,000 mcg by mouth daily.    Marland Kitchen donepezil (ARICEPT) 10 MG tablet TAKE 1 TABLET BY MOUTH AT BEDTIME. 90 tablet 0  . escitalopram (LEXAPRO) 10 MG tablet Take 10 mg by mouth daily with supper.   0  . fluocinonide cream (LIDEX) 5.62 % Apply 1 application topically 2 (two) times  daily.    . megestrol (MEGACE) 20 MG tablet Take 20 mg by mouth 2 (two) times daily.    . memantine (NAMENDA) 10 MG tablet TAKE 1 TABLET BY MOUTH TWICE DAILY. 180 tablet 0  . metoprolol succinate (TOPROL XL) 25 MG 24 hr tablet Take 0.5 tablets (12.5 mg total) by mouth daily. 45 tablet 1  . pyridostigmine (MESTINON) 60 MG tablet Take 30 mg by mouth at bedtime.     Marland Kitchen Specialty Vitamins Products (BRAIN PO) Take 1 tablet by mouth daily. CogniSense Supplement    . SYNTHROID 88 MCG tablet Take 88 mcg by mouth See admin instructions. Take 1 tablet (88 mcg) by mouth on Sundays, Mondays, Tuesdays, Wednesdays,Thursdays, & Saturdays at bedtime, DOES NOT TAKE ANY Fridays.     Current Facility-Administered Medications  Medication Dose Route Frequency Provider Last Rate Last Admin  . sodium chloride flush (NS) 0.9 % injection 3 mL  3 mL Intravenous Q12H Arnoldo Lenis, MD        Allergies as of 04/22/2020 - Review Complete 04/22/2020  Allergen Reaction Noted  . Penicillins Hives and Swelling 07/28/2010  . Benadryl [diphenhydramine hcl] Hives 12/02/2010  . Msm [methylsulfonylmethane] Hives 06/02/2014  . Nubain [nalbuphine hcl] Hives 06/02/2014  . Sulfa antibiotics Hives 07/28/2010  . Codeine  07/28/2010    Family History  Problem Relation Age of Onset  . Hypertension Mother   . Angina Mother   . COPD Father   . Heart failure Father   . Cancer Sister   . Dementia Sister   . Cardiomyopathy Brother   . Pneumonia Other   . Migraines Sister   . Colon cancer Neg Hx     Social History   Socioeconomic History  . Marital status: Married    Spouse name: Not on file  . Number of children: Not on file  . Years of education: 4  . Highest education level: Not on file  Occupational History  . Not on file  Tobacco Use  . Smoking status: Current Every Day Smoker    Packs/day: 1.00    Years: 45.00    Pack years: 45.00    Types: Cigarettes    Start date: 09/10/1966  . Smokeless tobacco: Never  Used  . Tobacco comment: 12/21/16 1 PPD  Vaping Use  . Vaping Use: Never used  Substance and Sexual Activity  . Alcohol use: No    Alcohol/week: 0.0 standard drinks  . Drug use: No  . Sexual activity: Yes  Other Topics Concern  . Not on file  Social History Narrative   Patient is right handed.   Patient drinks 2 cups of caffeine daily.   Education [employer]   Social Determinants of Health   Financial Resource Strain: Not on file  Food Insecurity: Not on file  Transportation Needs: Not on file  Physical Activity: Not on file  Stress: Not on file  Social Connections: Not on file    Review of Systems: Gen: Denies fever,  chills, anorexia. Denies fatigue, weakness, weight loss.  CV: Denies chest pain, palpitations, syncope, peripheral edema, and claudication. Resp: Denies dyspnea at rest, cough, wheezing, coughing up blood, and pleurisy. GI: see HPI  Derm: Denies rash, itching, dry skin Psych: Denies depression, anxiety, memory loss, confusion. No homicidal or suicidal ideation.  Heme: Denies bruising, bleeding, and enlarged lymph nodes.  Physical Exam: BP (!) 118/92   Pulse 80   Temp (!) 96.6 F (35.9 C) (Temporal)   Ht 5\' 1"  (1.549 m)   Wt 115 lb 12.8 oz (52.5 kg)   BMI 21.88 kg/m  General:   Alert and oriented. No distress noted. Pleasant and cooperative.  Head:  Normocephalic and atraumatic. Eyes:  Conjuctiva clear without scleral icterus. Mouth:  Mask in place Abdomen:  +BS, soft, non-tender and non-distended. No rebound or guarding. No HSM or masses noted. Msk:  Symmetrical without gross deformities. Normal posture. Extremities:  Without edema. Neurologic:  Alert and  oriented x4 Psych:  Alert and cooperative. Normal mood and affect.  ASSESSMENT: Melanie Bradley is a 70 y.o. female presenting today with a history of acute onset of diarrhea shortly after eating at a local restaurant in Sept 2020, requiring hospitalization as she had failed outpatient  supportive therapy. Cdiff negative, GI pathogen panel negative at that time and empirically treated with antibiotic therapy. She improved with supportive measures and had been doing well until around Thanksgiving, when hospitalized with Covid, dehydration, diarrhea. When last seen in Jan 2022, she was slowly improving.   Now taking Imodium BID, feeling wonderful and active per patient. No alarm signs/symptoms. Weight improved from last visit and overall within baseline despite fluctuating intermittently.  Mild normocytic anemia noted in setting of acute illness in Dec 2021. Will recheck today. Will need colonoscopy if IDA. We also discussed colonoscopy if any recurrent diarrhea despite supportive measures.   PLAN:  CBC today Imodium BID prn Return in 6 months   Annitta Needs, PhD, [facility]

## 2020-04-22 NOTE — Patient Instructions (Signed)
I am glad you are doing well!  Please have blood work done today.  We can see you in 6 months. If the blood work shows any anemia, we will need to do further evaluation.  I enjoyed seeing you again today! As you know, I value our relationship and want to provide genuine, compassionate, and quality care. I welcome your feedback. If you receive a survey regarding your visit,  I greatly appreciate you taking time to fill this out. See you next time!  Annitta Needs, PhD, ANP-BC [facility]

## 2020-04-27 ENCOUNTER — Telehealth: Payer: Self-pay | Admitting: *Deleted

## 2020-04-27 DIAGNOSIS — I5022 Chronic systolic (congestive) heart failure: Secondary | ICD-10-CM

## 2020-04-27 MED ORDER — SPIRONOLACTONE 25 MG PO TABS: 12.5000 mg | ORAL_TABLET | Freq: Every day | ORAL | 1 refills | Status: DC

## 2020-04-27 NOTE — Telephone Encounter (Signed)
-----   Message from Arnoldo Lenis, MD sent at 04/26/2020  9:15 AM EDT ----- Kidneys continue to improve, not quit back to normal. Would hold on entresto for now but would start aldactone 12.5 mg daily. Can she get a bmet in 2 weeks  Zandra Abts MD

## 2020-04-27 NOTE — Telephone Encounter (Signed)
Pt and husband voiced understanding - updated med list and placed lab orders

## 2020-04-28 NOTE — Progress Notes (Signed)
Cc'ed to pcp °

## 2020-05-03 ENCOUNTER — Other Ambulatory Visit: Payer: Self-pay

## 2020-05-03 DIAGNOSIS — Z79899 Other long term (current) drug therapy: Secondary | ICD-10-CM

## 2020-05-03 DIAGNOSIS — D649 Anemia, unspecified: Secondary | ICD-10-CM

## 2020-05-13 ENCOUNTER — Other Ambulatory Visit (HOSPITAL_COMMUNITY)
Admission: RE | Admit: 2020-05-13 | Discharge: 2020-05-13 | Disposition: A | Discharge status: Home / Self Care | Payer: Medicare HMO | Source: Ambulatory Visit | Attending: Cardiology | Admitting: Cardiology

## 2020-05-13 ENCOUNTER — Other Ambulatory Visit: Payer: Self-pay

## 2020-05-13 DIAGNOSIS — Z79899 Other long term (current) drug therapy: Secondary | ICD-10-CM

## 2020-05-13 LAB — BASIC METABOLIC PANEL
Anion gap: 9 (ref 5–15)
BUN: 24 mg/dL — ABNORMAL HIGH (ref 8–23)
CO2: 22 mmol/L (ref 22–32)
Calcium: 9.3 mg/dL (ref 8.9–10.3)
Chloride: 99 mmol/L (ref 98–111)
Creatinine, Ser: 1.6 mg/dL — ABNORMAL HIGH (ref 0.44–1.00)
GFR, Estimated: 35 mL/min — ABNORMAL LOW (ref >60)
Glucose, Bld: 95 mg/dL (ref 70–99)
Potassium: 4.1 mmol/L (ref 3.5–5.1)
Sodium: 130 mmol/L — ABNORMAL LOW (ref 135–145)

## 2020-05-18 ENCOUNTER — Telehealth: Payer: Self-pay | Admitting: *Deleted

## 2020-05-18 DIAGNOSIS — I10 Essential (primary) hypertension: Secondary | ICD-10-CM

## 2020-05-18 NOTE — Telephone Encounter (Signed)
-----   Message from Arnoldo Lenis, MD sent at 05/17/2020  4:09 PM EDT ----- Aldactone caused some decrease in her sodium and kidney function. Does not appear able to tolerate at this time. Would d/c aldactone, continue other meds. Repeat a bmet in 1 month  J BrancH MD

## 2020-05-20 NOTE — Telephone Encounter (Signed)
Pt voiced understanding - lab orders placed for [facility] - updated medication list

## 2020-06-23 ENCOUNTER — Other Ambulatory Visit: Payer: Self-pay

## 2020-06-23 DIAGNOSIS — D649 Anemia, unspecified: Secondary | ICD-10-CM

## 2020-06-23 DIAGNOSIS — Z79899 Other long term (current) drug therapy: Secondary | ICD-10-CM

## 2020-07-01 ENCOUNTER — Other Ambulatory Visit (HOSPITAL_COMMUNITY)
Admission: RE | Admit: 2020-07-01 | Discharge: 2020-07-01 | Disposition: A | Discharge status: Home / Self Care | Payer: Medicare HMO | Source: Ambulatory Visit | Attending: Cardiology | Admitting: Cardiology

## 2020-07-01 ENCOUNTER — Other Ambulatory Visit: Payer: Self-pay

## 2020-07-01 DIAGNOSIS — I10 Essential (primary) hypertension: Secondary | ICD-10-CM | POA: Diagnosis present

## 2020-07-01 LAB — BASIC METABOLIC PANEL
Anion gap: 7 (ref 5–15)
BUN: 23 mg/dL (ref 8–23)
CO2: 25 mmol/L (ref 22–32)
Calcium: 9.4 mg/dL (ref 8.9–10.3)
Chloride: 103 mmol/L (ref 98–111)
Creatinine, Ser: 1.65 mg/dL — ABNORMAL HIGH (ref 0.44–1.00)
GFR, Estimated: 34 mL/min — ABNORMAL LOW (ref >60)
Glucose, Bld: 92 mg/dL (ref 70–99)
Potassium: 3.9 mmol/L (ref 3.5–5.1)
Sodium: 135 mmol/L (ref 135–145)

## 2020-07-01 NOTE — Addendum Note (Signed)
Addended by: Levonne Hubert on: 07/01/2020 09:44 AM   Modules accepted: Orders

## 2020-07-14 ENCOUNTER — Telehealth: Payer: Self-pay | Admitting: *Deleted

## 2020-07-14 NOTE — Telephone Encounter (Signed)
-----   Message from Arnoldo Lenis, MD sent at 07/13/2020 12:44 PM EDT ----- Renal function remains decreased but stable, continue working on hydration. No med changes   Zandra Abts MD

## 2020-07-15 NOTE — Telephone Encounter (Signed)
Pt aware.

## 2020-07-20 LAB — CBC WITH DIFFERENTIAL/PLATELET
Absolute Monocytes: 646 cells/uL (ref 200–950)
Basophils Absolute: 107 cells/uL (ref 0–200)
Basophils Relative: 1.5 %
Eosinophils Absolute: 341 cells/uL (ref 15–500)
Eosinophils Relative: 4.8 %
HCT: 39.7 % (ref 35.0–45.0)
Hemoglobin: 13.1 g/dL (ref 11.7–15.5)
Lymphs Abs: 2244 cells/uL (ref 850–3900)
MCH: 30 pg (ref 27.0–33.0)
MCHC: 33 g/dL (ref 32.0–36.0)
MCV: 91.1 fL (ref 80.0–100.0)
MPV: 10.1 fL (ref 7.5–12.5)
Monocytes Relative: 9.1 %
Neutro Abs: 3763 cells/uL (ref 1500–7800)
Neutrophils Relative %: 53 %
Platelets: 407 10*3/uL — ABNORMAL HIGH (ref 140–400)
RBC: 4.36 10*6/uL (ref 3.80–5.10)
RDW: 12.8 % (ref 11.0–15.0)
Total Lymphocyte: 31.6 %
WBC: 7.1 10*3/uL (ref 3.8–10.8)

## 2020-07-20 LAB — HEPATIC FUNCTION PANEL
AG Ratio: 1.5 (calc) (ref 1.0–2.5)
ALT: 8 U/L (ref 6–29)
AST: 12 U/L (ref 10–35)
Albumin: 4.2 g/dL (ref 3.6–5.1)
Alkaline phosphatase (APISO): 46 U/L (ref 37–153)
Bilirubin, Direct: 0.1 mg/dL (ref 0.0–0.2)
Globulin: 2.8 g/dL (calc) (ref 1.9–3.7)
Indirect Bilirubin: 0.3 mg/dL (calc) (ref 0.2–1.2)
Total Bilirubin: 0.4 mg/dL (ref 0.2–1.2)
Total Protein: 7 g/dL (ref 6.1–8.1)

## 2020-08-02 ENCOUNTER — Encounter: Payer: Self-pay | Admitting: Neurology

## 2020-08-02 ENCOUNTER — Ambulatory Visit: Payer: Medicare HMO | Admitting: Neurology

## 2020-08-02 VITALS — BP 128/88 | HR 67 | Ht 61.0 in | Wt 115.6 lb

## 2020-08-02 DIAGNOSIS — F0280 Dementia in other diseases classified elsewhere without behavioral disturbance: Secondary | ICD-10-CM

## 2020-08-02 DIAGNOSIS — G300 Alzheimer's disease with early onset: Secondary | ICD-10-CM | POA: Diagnosis not present

## 2020-08-02 MED ORDER — MEMANTINE HCL 10 MG PO TABS: 10.0000 mg | ORAL_TABLET | Freq: Two times a day (BID) | ORAL | 3 refills | Status: DC

## 2020-08-02 MED ORDER — DONEPEZIL HCL 10 MG PO TABS: 10.0000 mg | ORAL_TABLET | Freq: Every day | ORAL | 3 refills | Status: DC

## 2020-08-02 NOTE — Progress Notes (Signed)
Reason for visit: Alzheimer's disease  Melanie Bradley is an 70 y.o. female  History of present illness:  Melanie Bradley is a 70 year old right-handed white female with a history of a progressive memory disturbance consistent with Alzheimer's disease.  The patient comes in with her husband today who indicates that she has not had any significant alteration in her functional level since last seen.  She continues to be quite active, she will cut the grass, she will do housework, and gardening.  The patient sleeps well at night.  She remains on Aricept and Namenda.  She was in the hospital in November and then again in December 2021 with dehydration and worsening renal failure.  The patient does apparently drive a half a mile from the house to go to a cemetery, but otherwise she does not do any other driving.  There have been no safety issues noted by the husband.  She returns for further evaluation.  Past Medical History:  Diagnosis Date   Atrial fibrillation ([facility])    Bloating    Complication of anesthesia     " hard to wake up sometimes "   Dementia ([place])    Diverticula of colon    Epigastric burning sensation    GERD (gastroesophageal reflux disease)    Headache disorder 06/03/2014   [id number](875.6)    Helicobacter pylori gastritis 2004   Hemorrhoids 06/26/03   Dr. Laural Golden tcs   Hiatal hernia 06/26/03   Dr. 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 Kem Boroughs diverticula at the sigmoid colon with one of the transverse colon, normal terminal ileoscopy, small external hemorrhoids the   COLONOSCOPY N/A 01/20/2016   sigmoid diverticulosis, otherwise normal.    ESOPHAGOGASTRODUODENOSCOPY  03/15/2011   Dr.  Trevor Iha hiatal hernia, abnormal gastric mucosa of unclear significance. Gastric biopsies negative, no H. pylori.Procedure: ESOPHAGOGASTRODUODENOSCOPY (EGD);  Surgeon: Daneil Dolin, MD;  [facility];  Service: Endoscopy;  Laterality: N/A;  7:30   ORIF HUMERUS FRACTURE Left 05/29/2016   Procedure: LEFT OPEN REDUCTION INTERNAL FIXATION (ORIF) PROXIMAL HUMERUS FRACTURE;  Surgeon: Meredith Pel, MD;  Location: [place];  Service: Orthopedics;  Laterality: Left;   ORIF WRIST FRACTURE Left 05/29/2016   Procedure: OPEN REDUCTION INTERNAL FIXATION (ORIF) LEFT WRIST FRACTURE;  Surgeon: Meredith Pel, MD;  Location: [place];  Service: Orthopedics;  Laterality: Left;   RIGHT/LEFT HEART CATH AND CORONARY ANGIOGRAPHY N/A 04/17/2019   Procedure: RIGHT/LEFT HEART CATH AND CORONARY ANGIOGRAPHY;  Surgeon: Belva Crome, MD;  Location: [place] [facility];  Service: Cardiovascular;  Laterality: N/A;   TUBAL LIGATION     UPPER GASTROINTESTINAL ENDOSCOPY  06/26/03   Dr. Wynelle Bourgeois sliding hiatal hernia with 8 mm tongue of gastric type mucosa at distal esophagus bile in the stomach.    Family History  Problem Relation Age of Onset   Hypertension Mother    Angina Mother    COPD Father    Heart failure Father    Cancer Sister    Dementia Sister    Cardiomyopathy Brother    Pneumonia Other    Migraines Sister  Colon cancer Neg Hx     Social history:  reports that she has been smoking cigarettes. She started smoking about 53 years ago. She has a 45.00 pack-year smoking history. She has never used smokeless tobacco. She reports that she does not drink alcohol and does not use drugs.    Allergies  Allergen Reactions   Penicillins Hives and Swelling    Has patient had a PCN reaction causing IMMEDIATE RASH, FACIAL/TONGUE/THROAT SWELLING, SOB, OR LIGHTHEADEDNESS WITH HYPOTENSION:  #  #  #  YES  #  #  #  Has patient had a PCN reaction causing severe rash involving mucus membranes or skin  necrosis: No Has patient had a PCN reaction that required hospitalization No Has patient had a PCN reaction occurring within the last 10 years: No If all of the above answers are "NO", then may proceed with Cephalosporin use.    Benadryl [Diphenhydramine Hcl] Hives   Msm [Methylsulfonylmethane] Hives   Nubain [Nalbuphine Hcl] Hives   Sulfa Antibiotics Hives   Codeine     UNSPECIFIED REACTION [IF AT ALL] REFUSES TO TAKE    Medications:  Prior to Admission medications   Medication Sig Start Date End Date Taking? Authorizing Provider  acetaminophen (TYLENOL) 500 MG tablet Take 1,000 mg by mouth every 6 (six) hours as needed (headaches.).   Yes [provider]  alendronate (FOSAMAX) 70 MG tablet Take 70 mg by mouth every Tuesday.  07/27/18  Yes [provider]  aspirin EC 81 MG tablet Take 81 mg by mouth daily.   Yes [provider]  Calcium Carb-Cholecalciferol (CALCIUM 600+D3 PO) Take 1 tablet by mouth daily with supper.   Yes [provider]  Cyanocobalamin (B-12 PO) Take 6,000 mcg by mouth daily.   Yes [provider]  donepezil (ARICEPT) 10 MG tablet TAKE 1 TABLET BY MOUTH AT BEDTIME. 04/19/20  Yes Kathrynn Ducking, MD  escitalopram (LEXAPRO) 10 MG tablet Take 10 mg by mouth daily with supper.  10/16/14  Yes [provider]  fluocinonide cream (LIDEX) 7.56 % Apply 1 application topically 2 (two) times daily. 01/23/20  Yes [provider]  megestrol (MEGACE) 20 MG tablet Take 20 mg by mouth 2 (two) times daily. 01/20/20  Yes [provider]  memantine (NAMENDA) 10 MG tablet TAKE 1 TABLET BY MOUTH TWICE DAILY. 04/19/20  Yes Kathrynn Ducking, MD  metoprolol succinate (TOPROL XL) 25 MG 24 hr tablet Take 0.5 tablets (12.5 mg total) by mouth daily. 04/05/20  Yes BranchAlphonse Guild, MD  pyridostigmine (MESTINON) 60 MG tablet Take 30 mg by mouth at bedtime.    Yes [provider]  Specialty Vitamins Products (BRAIN PO)  Take 1 tablet by mouth daily. CogniSense Supplement   Yes [provider]  SYNTHROID 88 MCG tablet Take 88 mcg by mouth See admin instructions. Take 1 tablet (88 mcg) by mouth on Sundays, Mondays, Tuesdays, Wednesdays,Thursdays, & Saturdays at bedtime, DOES NOT TAKE ANY Fridays. 12/15/17  Yes [provider]    ROS:  Out of a complete 14 system review of symptoms, the patient complains only of the following symptoms, and all other reviewed systems are negative.  Memory disturbance  Blood pressure 128/88, pulse 67, height 5\' 1"  (1.549 m), weight 115 lb 9.6 oz (52.4 kg).  Physical Exam  General: The patient is alert and cooperative at the time of the examination.  Skin: No significant peripheral edema is noted.   Neurologic Exam  Mental status: The  patient is alert and oriented x 1 at the time of the examination (oriented to person). The Mini-Mental status examination done today shows a total score of 7/30.   Cranial nerves: Facial symmetry is present. Speech is normal, no aphasia or dysarthria is noted. Extraocular movements are full. Visual fields are full.  Motor: The patient has good strength in all 4 extremities.  Sensory examination: Soft touch sensation is symmetric on the face, arms, and legs.  Coordination: The patient has good finger-nose-finger bilaterally.  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  Gait and station: The patient has a normal gait. Tandem gait could not be performed due to apraxia. Romberg is negative, but is slightly unsteady. No drift is seen.  Reflexes: Deep tendon reflexes are symmetric.   Assessment/Plan:  1.  Alzheimer's disease  The patient functions much better than her Mini-Mental status examination would suggest.  She seems to be fairly stable in terms of her ability to function at home.  The patient will remain on Aricept and Namenda, she will follow-up in 9 months.  The husband needs to scrutinize her driving  abilities closely.  The patient will stop taking the Mestinon at night, it is not clear why the medication was added in the past.  The patient does report some occasional episodes of diarrhea, if this worsens the Aricept dose will need to be reduced.  The patient can follow-up with Dr. Brett Fairy in the future.  Jill Alexanders MD 08/02/2020 9:56 AM  [facility] [address] [place], [place] [address]  Phone [phone] Fax [phone]

## 2020-08-13 ENCOUNTER — Encounter: Payer: Self-pay | Admitting: Cardiology

## 2020-08-13 ENCOUNTER — Ambulatory Visit: Payer: Medicare HMO | Admitting: Cardiology

## 2020-08-13 ENCOUNTER — Other Ambulatory Visit: Payer: Self-pay

## 2020-08-13 VITALS — BP 116/68 | HR 72 | Ht 61.0 in | Wt 118.0 lb

## 2020-08-13 DIAGNOSIS — I5022 Chronic systolic (congestive) heart failure: Secondary | ICD-10-CM | POA: Diagnosis not present

## 2020-08-13 MED ORDER — METOPROLOL SUCCINATE ER 25 MG PO TB24: 25.0000 mg | ORAL_TABLET | Freq: Every day | ORAL | 3 refills | Status: DC

## 2020-08-13 NOTE — Patient Instructions (Signed)
Medication Instructions:  Your physician has recommended you make the following change in your medication:  INCREASE Toprol-XL to 25 mg daily  *If you need a refill on your cardiac medications before your next appointment, please call your pharmacy*   Lab Work: None If you have labs (blood work) drawn today and your tests are completely normal, you will receive your results only by: [place] (if you have MyChart) OR A paper copy in the mail If you have any lab test that is abnormal or we need to change your treatment, we will call you to review the results.   Testing/Procedures: None   Follow-Up: At [facility], you and your health needs are our priority.  As part of our continuing mission to provide you with exceptional heart care, we have created designated Provider Care Teams.  These Care Teams include your primary Cardiologist (physician) and Advanced Practice Providers (APPs -  Physician Assistants and Nurse Practitioners) who all work together to provide you with the care you need, when you need it.  We recommend signing up for the patient portal called "MyChart".  Sign up information is provided on this After Visit Summary.  MyChart is used to connect with patients for Virtual Visits (Telemedicine).  Patients are able to view lab/test results, encounter notes, upcoming appointments, etc.  Non-urgent messages can be sent to your provider as well.   To learn more about what you can do with MyChart, go to [email or web address].    Your next appointment:   4 month(s)  The format for your next appointment:   In Person  Provider:   Carlyle Dolly, MD   Other Instructions

## 2020-08-13 NOTE — Progress Notes (Signed)
Clinical Summary Ms. Attwood is a 70 y.o.female seen today for follow up of the following medical problems.      1. Chronic systolic HF - new diagnosis during 10/2018 admission with syncope. Echo showed LVEF 35-40% - no recent SOb/DOE. Compliant with meds - bp is 114/75, HR 64 today     Jan 2021 echo LVEF 35-40%, mild to mod MR.    04/2019 no significant CAD, normal filling pressures. CI 3.7 Mean PA 15, PCWP 7 07/2019 CMRI: LVEF 38%, normal RV. No infiltrative disease       -has had recent long term GI issues, partly chronic and partly exacerbated by recent COVID infection. Has lost 13 lbs, chronic intermittent diarrhea that has led to low bp's and intermittent issues with dehydration. Recent admission with dehydration, significant AKI - off all CHF meds due to low bp's, recent AKI    - we restarted aldactone, caused decrease in sodium and elevation in Cr. We discontinued - 06/2020 Cr 1.65 Na 135 K 3.9 - no recent LE edema, no SOB/DOE - upcoming labs with pcp   2. CHronic diarrhea - some ongoing symptoms, though improving.     3. Chronic LBBB - no significant CAD by cath 04/2019       4.  Memory deficit/Dementia - on aricept, followed by neuro -remains active, independent. Enjoys doing yard and housework.     5. COVID 19 - admitted 12/2019 with COVID, primarily GI/diarrhea symptoms. Presented dehydrated with AKI on CKD - still with decreased oral intake, soft bp's at home. Husband working to encourage fluids.    - intermittent diarrhea ongoing. 2-3 times loose watery stools. - increasing appetite but still not back at baseline, started on megace    6. CKD - recent admission 01/2020 with AKI on CKD, Cr up to 4.5 in setting of deyhdyration in setting of ongoing diarrhea related to COVid enteritis - entresto held    7. Distal fibula fracture - occurred after recent fall   Past Medical History:  Diagnosis Date   Atrial fibrillation (HCC)    Bloating     Complication of anesthesia     " hard to wake up sometimes "   Dementia ([place])    Diverticula of colon    Epigastric burning sensation    GERD (gastroesophageal reflux disease)    Headache disorder 06/03/2014   JQGBEEFE(071.2)    Helicobacter pylori gastritis 2004   Hemorrhoids 06/26/03   Dr. Laural Golden tcs   Hiatal hernia 06/26/03   Dr. Laural Golden egd   Hypertension    Hypothyroidism    Left bundle Batoul Limes block    rate dependent LBBB 04/2013   Memory difficulty 06/03/2014   Shoulder fracture, left    Spastic colon    Wrist fracture      Allergies  Allergen Reactions   Penicillins Hives and Swelling    Has patient had a PCN reaction causing IMMEDIATE RASH, FACIAL/TONGUE/THROAT SWELLING, SOB, OR LIGHTHEADEDNESS WITH HYPOTENSION:  #  #  #  YES  #  #  #  Has patient had a PCN reaction causing severe rash involving mucus membranes or skin necrosis: No Has patient had a PCN reaction that required hospitalization No Has patient had a PCN reaction occurring within the last 10 years: No If all of the above answers are "NO", then may proceed with Cephalosporin use.    Benadryl [Diphenhydramine Hcl] Hives   Msm [Methylsulfonylmethane] Hives   Nubain [Nalbuphine Hcl] Hives   Sulfa  Antibiotics Hives   Codeine     UNSPECIFIED REACTION [IF AT ALL] REFUSES TO TAKE     Current Outpatient Medications  Medication Sig Dispense Refill   acetaminophen (TYLENOL) 500 MG tablet Take 1,000 mg by mouth every 6 (six) hours as needed (headaches.).     alendronate (FOSAMAX) 70 MG tablet Take 70 mg by mouth every Tuesday.      aspirin EC 81 MG tablet Take 81 mg by mouth daily.     Calcium Carb-Cholecalciferol (CALCIUM 600+D3 PO) Take 1 tablet by mouth daily with supper.     Cyanocobalamin (B-12 PO) Take 6,000 mcg by mouth daily.     donepezil (ARICEPT) 10 MG tablet Take 1 tablet (10 mg total) by mouth at bedtime. 90 tablet 3   escitalopram (LEXAPRO) 10 MG tablet Take 10 mg by mouth daily with supper.   0    fluocinonide cream (LIDEX) 2.29 % Apply 1 application topically 2 (two) times daily.     megestrol (MEGACE) 20 MG tablet Take 20 mg by mouth 2 (two) times daily.     memantine (NAMENDA) 10 MG tablet Take 1 tablet (10 mg total) by mouth 2 (two) times daily. 180 tablet 3   metoprolol succinate (TOPROL XL) 25 MG 24 hr tablet Take 0.5 tablets (12.5 mg total) by mouth daily. 45 tablet 1   Specialty Vitamins Products (BRAIN PO) Take 1 tablet by mouth daily. CogniSense Supplement     SYNTHROID 88 MCG tablet Take 88 mcg by mouth See admin instructions. Take 1 tablet (88 mcg) by mouth on Sundays, Mondays, Tuesdays, Wednesdays,Thursdays, & Saturdays at bedtime, DOES NOT TAKE ANY Fridays.     Current Facility-Administered Medications  Medication Dose Route Frequency Provider Last Rate Last Admin   sodium chloride flush (NS) 0.9 % injection 3 mL  3 mL Intravenous Q12H Arnoldo Lenis, MD         Past Surgical History:  Procedure Laterality Date   APPENDECTOMY     CHOLECYSTECTOMY     COLONOSCOPY  06/26/03   Dr. Kem Boroughs diverticula at the sigmoid colon with one of the transverse colon, normal terminal ileoscopy, small external hemorrhoids the   COLONOSCOPY N/A 01/20/2016   sigmoid diverticulosis, otherwise normal.    ESOPHAGOGASTRODUODENOSCOPY  03/15/2011   Dr. Trevor Iha hiatal hernia, abnormal gastric mucosa of unclear significance. Gastric biopsies negative, no H. pylori.Procedure: ESOPHAGOGASTRODUODENOSCOPY (EGD);  Surgeon: Daneil Dolin, MD;  [facility];  Service: Endoscopy;  Laterality: N/A;  7:30   ORIF HUMERUS FRACTURE Left 05/29/2016   Procedure: LEFT OPEN REDUCTION INTERNAL FIXATION (ORIF) PROXIMAL HUMERUS FRACTURE;  Surgeon: Meredith Pel, MD;  Location: [facility];  Service: Orthopedics;  Laterality: Left;   ORIF WRIST FRACTURE Left 05/29/2016   Procedure: OPEN REDUCTION INTERNAL FIXATION (ORIF) LEFT WRIST FRACTURE;  Surgeon: Meredith Pel, MD;  Location: [facility];   Service: Orthopedics;  Laterality: Left;   RIGHT/LEFT HEART CATH AND CORONARY ANGIOGRAPHY N/A 04/17/2019   Procedure: RIGHT/LEFT HEART CATH AND CORONARY ANGIOGRAPHY;  Surgeon: Belva Crome, MD;  Location: [facility];  Service: Cardiovascular;  Laterality: N/A;   TUBAL LIGATION     UPPER GASTROINTESTINAL ENDOSCOPY  06/26/03   Dr. Wynelle Bourgeois sliding hiatal hernia with 8 mm tongue of gastric type mucosa at distal esophagus bile in the stomach.     Allergies  Allergen Reactions   Penicillins Hives and Swelling    Has patient had a PCN reaction causing IMMEDIATE RASH, FACIAL/TONGUE/THROAT SWELLING, SOB, OR  LIGHTHEADEDNESS WITH HYPOTENSION:  #  #  #  YES  #  #  #  Has patient had a PCN reaction causing severe rash involving mucus membranes or skin necrosis: No Has patient had a PCN reaction that required hospitalization No Has patient had a PCN reaction occurring within the last 10 years: No If all of the above answers are "NO", then may proceed with Cephalosporin use.    Benadryl [Diphenhydramine Hcl] Hives   Msm [Methylsulfonylmethane] Hives   Nubain [Nalbuphine Hcl] Hives   Sulfa Antibiotics Hives   Codeine     UNSPECIFIED REACTION [IF AT ALL] REFUSES TO TAKE      Family History  Problem Relation Age of Onset   Hypertension Mother    Angina Mother    COPD Father    Heart failure Father    Cancer Sister    Dementia Sister    Cardiomyopathy Brother    Pneumonia Other    Migraines Sister    Colon cancer Neg Hx      Social History Ms. Diver reports that she has been smoking cigarettes. She started smoking about 53 years ago. She has a 45.00 pack-year smoking history. She has never used smokeless tobacco. Ms. Wooley reports no history of alcohol use.   Review of Systems CONSTITUTIONAL: No weight loss, fever, chills, weakness or fatigue.  HEENT: Eyes: No visual loss, blurred vision, double vision or yellow sclerae.No hearing loss, sneezing, congestion, runny  nose or sore throat.  SKIN: No rash or itching.  CARDIOVASCULAR: per hpi RESPIRATORY: No shortness of breath, cough or sputum.  GASTROINTESTINAL: No anorexia, nausea, vomiting or diarrhea. No abdominal pain or blood.  GENITOURINARY: No burning on urination, no polyuria NEUROLOGICAL: No headache, dizziness, syncope, paralysis, ataxia, numbness or tingling in the extremities. No change in bowel or bladder control.  MUSCULOSKELETAL: No muscle, back pain, joint pain or stiffness.  LYMPHATICS: No enlarged nodes. No history of splenectomy.  PSYCHIATRIC: No history of depression or anxiety.  ENDOCRINOLOGIC: No reports of sweating, cold or heat intolerance. No polyuria or polydipsia.  Marland Kitchen   Physical Examination Today's Vitals   08/13/20 1011  BP: 116/68  Pulse: 72  SpO2: 99%  Weight: 118 lb (53.5 kg)  Height: 5\' 1"  (1.549 m)   Body mass index is 22.3 kg/m.  Gen: resting comfortably, no acute distress HEENT: no scleral icterus, pupils equal round and reactive, no palptable cervical adenopathy,  CV: RRR, no m/r/g, no jvd Resp: Clear to auscultation bilaterally GI: abdomen is soft, non-tender, non-distended, normal bowel sounds, no hepatosplenomegaly MSK: extremities are warm, no edema.  Skin: warm, no rash Neuro:  no focal deficits Psych: appropriate affect   Diagnostic Studies  04/2013 Lexiscan MPI FINDINGS:   The patient's initial EKG revealed a narrow QRS. After she received   Lexiscan, the heart rate increased to 145 and the QRS widened to a   left bundle Gethsemane Fischler block. The increased rate persisted for several   minutes but then began to slow. When the heart rate was slower, P   waves could be seen. There were no flutter waves. The raw data   reveals no evidence of excess motion. Tomographic images with stress   reveal a small area of moderate decreased uptake in the septum. Rest   images reveal a moderate area of decreased uptake in the septum.   These findings are most  consistent with left bundle Danton Palmateer block.   Wall motion analysis reveals excellent motion with an ejection  fraction of 70%.   IMPRESSION:   This is a low risk scan. There is no scar or ischemia. There is   decreased activity in the septum, consistent with left bundle Vale Mousseau   block. It is noted that the QRS was narrow at the start of the   study. However with Lexiscan, the patient developed tachycardia with   left bundle Chele Cornell block. Assessment shows that this was most   probably sinus tachycardia which slowed over time.   10/2018 echo IMPRESSIONS      1. The left ventricle has moderately reduced systolic function, with an ejection fraction of 35-40%. The cavity size was normal. Severe focal basal septal hypertrophy.. Diastolic dysfunction, grade indeterminate. Left ventricular diffuse hypokinesis.  2. The right ventricle has normal systolic function. The cavity was normal. There is no increase in right ventricular wall thickness.  3. The aortic valve is tricuspid. Mild thickening of the aortic valve. Mild aortic annular calcification noted.  4. The mitral valve is grossly normal. Mild thickening of the mitral valve leaflet. Mitral valve regurgitation is mild to moderate by color flow Doppler.  5. The tricuspid valve is grossly normal.  6. The aorta is normal unless otherwise noted.   12/2018 30 day event monitor 30 day event monitor Min HR 57, Max HR 130, Avg HR 79 No symptoms reported Telemetry tracings show sinus rhythm with occasional PVCs. No significant arrhythmias   Jan 2021 echo IMPRESSIONS     1. Left ventricular ejection fraction, by visual estimation, is 35 to  40%. The left ventricle has moderately decreased function. There is no  left ventricular hypertrophy.   2. Left ventricular diastolic parameters are indeterminate.   3. The left ventricle demonstrates global hypokinesis.   4. Global right ventricle has normal systolic function.The right  ventricular size is  normal. No increase in right ventricular wall  thickness.   5. Left atrial size was normal.   6. Right atrial size was normal.   7. The mitral valve is normal in structure. Mild to moderate mitral valve  regurgitation. No evidence of mitral stenosis.   8. The tricuspid valve is normal in structure.   9. The aortic valve has an indeterminant number of cusps. Aortic valve  regurgitation is not visualized. No evidence of aortic valve sclerosis or  stenosis.  10. The pulmonic valve was not well visualized. Pulmonic valve  regurgitation is not visualized.  11. The inferior vena cava is normal in size with greater than 50%  respiratory variability, suggesting right atrial pressure of 3 mmHg.       04/2019 RHC/LHC Normal right heart pressures.  Cardiac output 3.7 L/min.  Cardiac index 2.5 L/min/m Normal left main Transapical LAD.  First diagonal contains segmental proximal 40% narrowing. Normal small ramus. Circumflex is normal. The right coronary demonstrates generalized minimal atherosclerosis.  No focal stenosis. Anterior and apical mild to moderate hypokinesis.  Estimated ejection fraction 35 to 40%.   RECOMMENDATIONS:   Guideline directed therapy for left ventricular systolic dysfunction in the setting of left bundle Shilynn Hoch block.  If no improvement in LV function with therapy, consider resync.       Assessment and Plan  Chronic systolic HF -recent limitations on medication regimen due to soft bp's and renal dysfunction - currently just on toprol. Recent retrial of aldactone causes drop in Na and elevation in Cr. Have not tried restarting entresto due to renal function. With labile renal function avoiding farxiga. Im not sure her bp would tolerate  hydral/imdur but could consider in the future - increase toprol to 25mg  daily       Arnoldo Lenis, M.D.

## 2020-11-02 ENCOUNTER — Ambulatory Visit: Payer: Medicare HMO | Admitting: Gastroenterology

## 2020-11-02 ENCOUNTER — Encounter: Payer: Self-pay | Admitting: Gastroenterology

## 2020-11-02 ENCOUNTER — Other Ambulatory Visit: Payer: Self-pay

## 2020-11-02 VITALS — BP 123/91 | HR 74 | Temp 97.1°F | Ht 61.0 in | Wt 119.6 lb

## 2020-11-02 DIAGNOSIS — Z87898 Personal history of other specified conditions: Secondary | ICD-10-CM

## 2020-11-02 DIAGNOSIS — K589 Irritable bowel syndrome without diarrhea: Secondary | ICD-10-CM

## 2020-11-02 NOTE — Patient Instructions (Signed)
I am glad you are doing so well!  Please call with any diarrhea, abdominal pain, weight loss, difficulty swallowing, or blood in your stool.  We will see you in 1 year!  I enjoyed seeing you again today! As you know, I value our relationship and want to provide genuine, compassionate, and quality care. I welcome your feedback. If you receive a survey regarding your visit,  I greatly appreciate you taking time to fill this out. See you next time!  Annitta Needs, PhD, ANP-BC [facility]

## 2020-11-02 NOTE — Progress Notes (Signed)
Referring Provider: Sharilyn Sites, MD Primary Care Physician:  Sharilyn Sites, MD Primary GI: Dr. Gala Romney   Chief Complaint  Patient presents with   Diarrhea    some    HPI:   Melanie Bradley is a 70 y.o. female presenting today with a history of acute onset of diarrhea shortly after eating at a local restaurant in Sept 2020, requiring hospitalization as she had failed outpatient supportive therapy. Cdiff negative, GI pathogen panel negative. Empirically treated with antibiotic therapy. Last colonoscopy in 2017 with sigmoid diverticulosis, otherwise normal. 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  She returns today with her husband doing well. No longer taking imodium. Rare diarrhea. Feeling well. Good appetite. No N/V. No rectal bleeding.   Past Medical History:  Diagnosis Date   Atrial fibrillation (HCC)    Bloating    Complication of anesthesia     " hard to wake up sometimes "   Dementia ([place])    Diverticula of colon    Epigastric burning sensation    GERD (gastroesophageal reflux disease)    Headache disorder 06/03/2014   RFFMBWGY(659.9)    Helicobacter pylori gastritis 2004   Hemorrhoids 06/26/03   Dr. Laural Golden tcs   Hiatal hernia 06/26/03   Dr. 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 Kem Boroughs diverticula at the sigmoid colon with one of the transverse colon, normal terminal ileoscopy, small external hemorrhoids the   COLONOSCOPY N/A 01/20/2016   sigmoid diverticulosis, otherwise normal.    ESOPHAGOGASTRODUODENOSCOPY  03/15/2011   Dr. Trevor Iha hiatal hernia, abnormal gastric mucosa of unclear significance. Gastric biopsies negative, no H.  pylori.Procedure: ESOPHAGOGASTRODUODENOSCOPY (EGD);  Surgeon: Daneil Dolin, MD;  [facility];  Service: Endoscopy;  Laterality: N/A;  7:30   ORIF HUMERUS FRACTURE Left 05/29/2016   Procedure: LEFT OPEN REDUCTION INTERNAL FIXATION (ORIF) PROXIMAL HUMERUS FRACTURE;  Surgeon: Meredith Pel, MD;  Location: [facility];  Service: Orthopedics;  Laterality: Left;   ORIF WRIST FRACTURE Left 05/29/2016   Procedure: OPEN REDUCTION INTERNAL FIXATION (ORIF) LEFT WRIST FRACTURE;  Surgeon: Meredith Pel, MD;  Location: [facility];  Service: Orthopedics;  Laterality: Left;   RIGHT/LEFT HEART CATH AND CORONARY ANGIOGRAPHY N/A 04/17/2019   Procedure: RIGHT/LEFT HEART CATH AND CORONARY ANGIOGRAPHY;  Surgeon: Belva Crome, MD;  Location: [facility];  Service: Cardiovascular;  Laterality: N/A;   TUBAL LIGATION     UPPER GASTROINTESTINAL ENDOSCOPY  06/26/03   Dr. Wynelle Bourgeois sliding hiatal hernia with 8 mm tongue of gastric type mucosa at distal esophagus bile in the stomach.    Current Outpatient Medications  Medication Sig Dispense Refill   acetaminophen (TYLENOL) 500 MG tablet Take 1,000 mg by mouth every 6 (six) hours as needed (headaches.).     alendronate (FOSAMAX) 70 MG tablet Take 70 mg by mouth every Tuesday.      aspirin EC 81 MG tablet Take 81 mg by mouth daily.     Calcium Carb-Cholecalciferol (CALCIUM 600+D3 PO) Take 1 tablet by mouth daily with supper.     Cyanocobalamin (B-12 PO) Take 6,000 mcg  by mouth daily.     donepezil (ARICEPT) 10 MG tablet Take 1 tablet (10 mg total) by mouth at bedtime. 90 tablet 3   escitalopram (LEXAPRO) 10 MG tablet Take 10 mg by mouth daily with supper.   0   megestrol (MEGACE) 20 MG tablet Take 20 mg by mouth 2 (two) times daily.     memantine (NAMENDA) 10 MG tablet Take 1 tablet (10 mg total) by mouth 2 (two) times daily. 180 tablet 3   metoprolol succinate (TOPROL XL) 25 MG 24 hr tablet Take 1 tablet (25 mg total) by mouth daily. 90 tablet 3    Specialty Vitamins Products (BRAIN PO) Take 1 tablet by mouth daily. CogniSense Supplement     SYNTHROID 88 MCG tablet Take 88 mcg by mouth See admin instructions. Take 1 tablet (88 mcg) by mouth on Sundays, Mondays, Tuesdays, Wednesdays,Thursdays, & Saturdays at bedtime, DOES NOT TAKE ANY Fridays.     Current Facility-Administered Medications  Medication Dose Route Frequency Provider Last Rate Last Admin   sodium chloride flush (NS) 0.9 % injection 3 mL  3 mL Intravenous Q12H Arnoldo Lenis, MD        Allergies as of 11/02/2020 - Review Complete 11/02/2020  Allergen Reaction Noted   Penicillins Hives and Swelling 07/28/2010   Benadryl [diphenhydramine hcl] Hives 12/02/2010   Msm [methylsulfonylmethane] Hives 06/02/2014   Nubain [nalbuphine hcl] Hives 06/02/2014   Sulfa antibiotics Hives 07/28/2010   Codeine  07/28/2010    Family History  Problem Relation Age of Onset   Hypertension Mother    Angina Mother    COPD Father    Heart failure Father    Cancer Sister    Dementia Sister    Cardiomyopathy Brother    Pneumonia Other    Migraines Sister    Colon cancer Neg Hx     Social History   Socioeconomic History   Marital status: Married    Spouse name: [name]   Number of children: Not on file   Years of education: 12   Highest education level: Not on file  Occupational History   Not on file  Tobacco Use   Smoking status: Every Day    Packs/day: 1.00    Years: 45.00    Pack years: 45.00    Types: Cigarettes    Start date: 09/10/1966   Smokeless tobacco: Never   Tobacco comments:    12/21/16 1 PPD  Vaping Use   Vaping Use: Never used  Substance and Sexual Activity   Alcohol use: No    Alcohol/week: 0.0 standard drinks   Drug use: No   Sexual activity: Yes  Other Topics Concern   Not on file  Social History Narrative   Patient is right handed.   Patient drinks 1-2 cups of caffeine daily.   Social Determinants of Health   Financial Resource Strain: Not on  file  Food Insecurity: Not on file  Transportation Needs: Not on file  Physical Activity: Not on file  Stress: Not on file  Social Connections: Not on file    Review of Systems: Gen: Denies fever, chills, anorexia. Denies fatigue, weakness, weight loss.  CV: Denies chest pain, palpitations, syncope, peripheral edema, and claudication. Resp: Denies dyspnea at rest, cough, wheezing, coughing up blood, and pleurisy. GI: see HPI Derm: Denies rash, itching, dry skin Psych: Denies depression, anxiety, memory loss, confusion. No homicidal or suicidal ideation.  Heme: Denies bruising, bleeding, and enlarged lymph nodes.  Physical Exam: BP Marland Kitchen)  123/91   Pulse 74   Temp (!) 97.1 F (36.2 C) (Temporal)   Ht 5\' 1"  (1.549 m)   Wt 119 lb 9.6 oz (54.3 kg)   BMI 22.60 kg/m  General:   Alert and oriented. No distress noted. Pleasant and cooperative.  Head:  Normocephalic and atraumatic. Eyes:  Conjuctiva clear without scleral icterus. Mouth:  mask in place Abdomen:  +BS, soft, non-tender and non-distended. No rebound or guarding. No HSM or masses noted. Msk:  Symmetrical without gross deformities. Normal posture. Extremities:  Without edema. Neurologic:  Alert and  oriented x4 Psych:  Alert and cooperative. Normal mood and affect.  ASSESSMENT/PLAN: Melanie Bradley is a 70 y.o. female presenting today with history of diarrhea in 2020 and was dealing with post-infectious IBS thereafter. She has continued to do well and now with diarrhea resolved. Last colonoscopy in 2017.   As she is doing well, we will see her back in 1 year. She is to call with any concerns in the meantime. Would pursue colonoscopy if recurrent or persistent diarrhea.   Annitta Needs, PhD, ANP-BC [facility]

## 2020-12-01 ENCOUNTER — Other Ambulatory Visit (HOSPITAL_COMMUNITY): Payer: Self-pay | Admitting: Family Medicine

## 2020-12-01 DIAGNOSIS — Z1231 Encounter for screening mammogram for malignant neoplasm of breast: Secondary | ICD-10-CM

## 2020-12-09 ENCOUNTER — Other Ambulatory Visit: Payer: Self-pay

## 2020-12-09 ENCOUNTER — Ambulatory Visit (HOSPITAL_COMMUNITY)
Admission: RE | Admit: 2020-12-09 | Discharge: 2020-12-09 | Disposition: A | Discharge status: Home / Self Care | Payer: Medicare HMO | Source: Ambulatory Visit | Attending: Family Medicine | Admitting: Family Medicine

## 2020-12-09 DIAGNOSIS — Z1231 Encounter for screening mammogram for malignant neoplasm of breast: Secondary | ICD-10-CM | POA: Insufficient documentation

## 2020-12-17 ENCOUNTER — Other Ambulatory Visit: Payer: Self-pay

## 2020-12-17 ENCOUNTER — Encounter: Payer: Self-pay | Admitting: Cardiology

## 2020-12-17 ENCOUNTER — Ambulatory Visit: Payer: Medicare HMO | Admitting: Cardiology

## 2020-12-17 VITALS — BP 140/90 | HR 63 | Ht 61.0 in | Wt 123.0 lb

## 2020-12-17 DIAGNOSIS — I5022 Chronic systolic (congestive) heart failure: Secondary | ICD-10-CM

## 2020-12-17 DIAGNOSIS — I447 Left bundle-branch block, unspecified: Secondary | ICD-10-CM | POA: Diagnosis not present

## 2020-12-17 NOTE — Progress Notes (Signed)
Clinical Summary Ms. Fuente is a 70 y.o.female seen today for follow up of the following medical problems.      1. Chronic systolic HF - new diagnosis during 10/2018 admission with syncope. Echo showed LVEF 35-40% - no recent SOb/DOE. Compliant with meds - bp is 114/75, HR 64 today     Jan 2021 echo LVEF 35-40%, mild to mod MR.    04/2019 no significant CAD, normal filling pressures. CI 3.7 Mean PA 15, PCWP 7 07/2019 CMRI: LVEF 38%, normal RV. No infiltrative disease       -has had recent long term GI issues, partly chronic and partly exacerbated by recent COVID infection. Has lost 13 lbs, chronic intermittent diarrhea that has led to low bp's and intermittent issues with dehydration. Recent admission with dehydration, significant AKI - off all CHF meds due to low bp's, recent AKI     - we restarted aldactone, caused decrease in sodium and elevation in Cr. We discontinued  -  no recent SOB/DOE - no LE edema    2. CHronic diarrhea - has resolved     3. Chronic LBBB - no significant CAD by cath 04/2019       4.  Memory deficit/Dementia - on aricept, followed by neuro -remains active, independent. Enjoys doing yard and housework.     5. COVID 19 - admitted 12/2019 with COVID, primarily GI/diarrhea symptoms. Presented dehydrated with AKI on CKD     6. CKD -  admission 01/2020 with AKI on CKD, Cr up to 4.5 in setting of deyhdyration in setting of ongoing diarrhea related to COVid enteritis - entresto held        Past Medical History:  Diagnosis Date   Atrial fibrillation ([place])    Bloating    Complication of anesthesia     " hard to wake up sometimes "   Dementia ([place])    Diverticula of colon    Epigastric burning sensation    GERD (gastroesophageal reflux disease)    Headache disorder 06/03/2014   [id number](132.4)    Helicobacter pylori gastritis 2004   Hemorrhoids 06/26/03   Dr. Laural Golden tcs   Hiatal hernia 06/26/03   Dr. Laural Golden egd   Hypertension     Hypothyroidism    Left bundle Kashia Brossard block    rate dependent LBBB 04/2013   Memory difficulty 06/03/2014   Shoulder fracture, left    Spastic colon    Wrist fracture      Allergies  Allergen Reactions   Penicillins Hives and Swelling    Has patient had a PCN reaction causing IMMEDIATE RASH, FACIAL/TONGUE/THROAT SWELLING, SOB, OR LIGHTHEADEDNESS WITH HYPOTENSION:  #  #  #  YES  #  #  #  Has patient had a PCN reaction causing severe rash involving mucus membranes or skin necrosis: No Has patient had a PCN reaction that required hospitalization No Has patient had a PCN reaction occurring within the last 10 years: No If all of the above answers are "NO", then may proceed with Cephalosporin use.    Benadryl [Diphenhydramine Hcl] Hives   Msm [Methylsulfonylmethane] Hives   Nubain [Nalbuphine Hcl] Hives   Sulfa Antibiotics Hives   Codeine     UNSPECIFIED REACTION [IF AT ALL] REFUSES TO TAKE     Current Outpatient Medications  Medication Sig Dispense Refill   acetaminophen (TYLENOL) 500 MG tablet Take 1,000 mg by mouth every 6 (six) hours as needed (headaches.).     alendronate (FOSAMAX) 70 MG  tablet Take 70 mg by mouth every Tuesday.      aspirin EC 81 MG tablet Take 81 mg by mouth daily.     Calcium Carb-Cholecalciferol (CALCIUM 600+D3 PO) Take 1 tablet by mouth daily with supper.     Cyanocobalamin (B-12 PO) Take 6,000 mcg by mouth daily.     donepezil (ARICEPT) 10 MG tablet Take 1 tablet (10 mg total) by mouth at bedtime. 90 tablet 3   escitalopram (LEXAPRO) 10 MG tablet Take 10 mg by mouth daily with supper.   0   megestrol (MEGACE) 20 MG tablet Take 20 mg by mouth 2 (two) times daily.     memantine (NAMENDA) 10 MG tablet Take 1 tablet (10 mg total) by mouth 2 (two) times daily. 180 tablet 3   metoprolol succinate (TOPROL XL) 25 MG 24 hr tablet Take 1 tablet (25 mg total) by mouth daily. 90 tablet 3   Specialty Vitamins Products (BRAIN PO) Take 1 tablet by mouth daily. CogniSense  Supplement     SYNTHROID 88 MCG tablet Take 88 mcg by mouth See admin instructions. Take 1 tablet (88 mcg) by mouth on Sundays, Mondays, Tuesdays, Wednesdays,Thursdays, & Saturdays at bedtime, DOES NOT TAKE ANY Fridays.     Current Facility-Administered Medications  Medication Dose Route Frequency Provider Last Rate Last Admin   sodium chloride flush (NS) 0.9 % injection 3 mL  3 mL Intravenous Q12H Arnoldo Lenis, MD         Past Surgical History:  Procedure Laterality Date   APPENDECTOMY     CHOLECYSTECTOMY     COLONOSCOPY  06/26/03   Dr. Kem Boroughs diverticula at the sigmoid colon with one of the transverse colon, normal terminal ileoscopy, small external hemorrhoids the   COLONOSCOPY N/A 01/20/2016   sigmoid diverticulosis, otherwise normal.    ESOPHAGOGASTRODUODENOSCOPY  03/15/2011   Dr. Trevor Iha hiatal hernia, abnormal gastric mucosa of unclear significance. Gastric biopsies negative, no H. pylori.Procedure: ESOPHAGOGASTRODUODENOSCOPY (EGD);  Surgeon: Daneil Dolin, MD;  [facility];  Service: Endoscopy;  Laterality: N/A;  7:30   ORIF HUMERUS FRACTURE Left 05/29/2016   Procedure: LEFT OPEN REDUCTION INTERNAL FIXATION (ORIF) PROXIMAL HUMERUS FRACTURE;  Surgeon: Meredith Pel, MD;  Location: [facility];  Service: Orthopedics;  Laterality: Left;   ORIF WRIST FRACTURE Left 05/29/2016   Procedure: OPEN REDUCTION INTERNAL FIXATION (ORIF) LEFT WRIST FRACTURE;  Surgeon: Meredith Pel, MD;  Location: [facility];  Service: Orthopedics;  Laterality: Left;   RIGHT/LEFT HEART CATH AND CORONARY ANGIOGRAPHY N/A 04/17/2019   Procedure: RIGHT/LEFT HEART CATH AND CORONARY ANGIOGRAPHY;  Surgeon: Belva Crome, MD;  Location: [facility];  Service: Cardiovascular;  Laterality: N/A;   TUBAL LIGATION     UPPER GASTROINTESTINAL ENDOSCOPY  06/26/03   Dr. Wynelle Bourgeois sliding hiatal hernia with 8 mm tongue of gastric type mucosa at distal esophagus bile in the stomach.      Allergies  Allergen Reactions   Penicillins Hives and Swelling    Has patient had a PCN reaction causing IMMEDIATE RASH, FACIAL/TONGUE/THROAT SWELLING, SOB, OR LIGHTHEADEDNESS WITH HYPOTENSION:  #  #  #  YES  #  #  #  Has patient had a PCN reaction causing severe rash involving mucus membranes or skin necrosis: No Has patient had a PCN reaction that required hospitalization No Has patient had a PCN reaction occurring within the last 10 years: No If all of the above answers are "NO", then may proceed with Cephalosporin use.  Benadryl [Diphenhydramine Hcl] Hives   Msm [Methylsulfonylmethane] Hives   Nubain [Nalbuphine Hcl] Hives   Sulfa Antibiotics Hives   Codeine     UNSPECIFIED REACTION [IF AT ALL] REFUSES TO TAKE      Family History  Problem Relation Age of Onset   Hypertension Mother    Angina Mother    COPD Father    Heart failure Father    Cancer Sister    Dementia Sister    Cardiomyopathy Brother    Pneumonia Other    Migraines Sister    Colon cancer Neg Hx      Social History Ms. Cottam reports that she has been smoking cigarettes. She started smoking about 54 years ago. She has a 45.00 pack-year smoking history. She has never used smokeless tobacco. Ms. Guinther reports no history of alcohol use.   Review of Systems CONSTITUTIONAL: No weight loss, fever, chills, weakness or fatigue.  HEENT: Eyes: No visual loss, blurred vision, double vision or yellow sclerae.No hearing loss, sneezing, congestion, runny nose or sore throat.  SKIN: No rash or itching.  CARDIOVASCULAR: per hpi RESPIRATORY: No shortness of breath, cough or sputum.  GASTROINTESTINAL: No anorexia, nausea, vomiting or diarrhea. No abdominal pain or blood.  GENITOURINARY: No burning on urination, no polyuria NEUROLOGICAL: No headache, dizziness, syncope, paralysis, ataxia, numbness or tingling in the extremities. No change in bowel or bladder control.  MUSCULOSKELETAL: No muscle, back  pain, joint pain or stiffness.  LYMPHATICS: No enlarged nodes. No history of splenectomy.  PSYCHIATRIC: No history of depression or anxiety.  ENDOCRINOLOGIC: No reports of sweating, cold or heat intolerance. No polyuria or polydipsia.  Marland Kitchen   Physical Examination Today's Vitals   12/17/20 1054  BP: 140/90  Pulse: 63  SpO2: 99%  Weight: 123 lb (55.8 kg)  Height: 5\' 1"  (1.549 m)   Body mass index is 23.24 kg/m.  Gen: resting comfortably, no acute distress HEENT: no scleral icterus, pupils equal round and reactive, no palptable cervical adenopathy,  CV: RRR, no m/r/g no jvd Resp: Clear to auscultation bilaterally GI: abdomen is soft, non-tender, non-distended, normal bowel sounds, no hepatosplenomegaly MSK: extremities are warm, no edema.  Skin: warm, no rash Neuro:  no focal deficits Psych: appropriate affect   Diagnostic Studies 04/2013 Lexiscan MPI FINDINGS:   The patient's initial EKG revealed a narrow QRS. After she received   Lexiscan, the heart rate increased to 145 and the QRS widened to a   left bundle Tritia Endo block. The increased rate persisted for several   minutes but then began to slow. When the heart rate was slower, P   waves could be seen. There were no flutter waves. The raw data   reveals no evidence of excess motion. Tomographic images with stress   reveal a small area of moderate decreased uptake in the septum. Rest   images reveal a moderate area of decreased uptake in the septum.   These findings are most consistent with left bundle Zailynn Brandel block.   Wall motion analysis reveals excellent motion with an ejection   fraction of 70%.   IMPRESSION:   This is a low risk scan. There is no scar or ischemia. There is   decreased activity in the septum, consistent with left bundle Jaleeya Mcnelly   block. It is noted that the QRS was narrow at the start of the   study. However with Lexiscan, the patient developed tachycardia with   left bundle Chrisopher Pustejovsky block. Assessment shows  that this was most  probably sinus tachycardia which slowed over time.   10/2018 echo IMPRESSIONS      1. The left ventricle has moderately reduced systolic function, with an ejection fraction of 35-40%. The cavity size was normal. Severe focal basal septal hypertrophy.. Diastolic dysfunction, grade indeterminate. Left ventricular diffuse hypokinesis.  2. The right ventricle has normal systolic function. The cavity was normal. There is no increase in right ventricular wall thickness.  3. The aortic valve is tricuspid. Mild thickening of the aortic valve. Mild aortic annular calcification noted.  4. The mitral valve is grossly normal. Mild thickening of the mitral valve leaflet. Mitral valve regurgitation is mild to moderate by color flow Doppler.  5. The tricuspid valve is grossly normal.  6. The aorta is normal unless otherwise noted.   12/2018 30 day event monitor 30 day event monitor Min HR 57, Max HR 130, Avg HR 79 No symptoms reported Telemetry tracings show sinus rhythm with occasional PVCs. No significant arrhythmias   Jan 2021 echo IMPRESSIONS     1. Left ventricular ejection fraction, by visual estimation, is 35 to  40%. The left ventricle has moderately decreased function. There is no  left ventricular hypertrophy.   2. Left ventricular diastolic parameters are indeterminate.   3. The left ventricle demonstrates global hypokinesis.   4. Global right ventricle has normal systolic function.The right  ventricular size is normal. No increase in right ventricular wall  thickness.   5. Left atrial size was normal.   6. Right atrial size was normal.   7. The mitral valve is normal in structure. Mild to moderate mitral valve  regurgitation. No evidence of mitral stenosis.   8. The tricuspid valve is normal in structure.   9. The aortic valve has an indeterminant number of cusps. Aortic valve  regurgitation is not visualized. No evidence of aortic valve sclerosis or   stenosis.  10. The pulmonic valve was not well visualized. Pulmonic valve  regurgitation is not visualized.  11. The inferior vena cava is normal in size with greater than 50%  respiratory variability, suggesting right atrial pressure of 3 mmHg.       04/2019 RHC/LHC Normal right heart pressures.  Cardiac output 3.7 L/min.  Cardiac index 2.5 L/min/m Normal left main Transapical LAD.  First diagonal contains segmental proximal 40% narrowing. Normal small ramus. Circumflex is normal. The right coronary demonstrates generalized minimal atherosclerosis.  No focal stenosis. Anterior and apical mild to moderate hypokinesis.  Estimated ejection fraction 35 to 40%.   RECOMMENDATIONS:   Guideline directed therapy for left ventricular systolic dysfunction in the setting of left bundle Vergene Marland block.  If no improvement in LV function with therapy, consider resync.            Assessment and Plan  Chronic systolic HF -recent limitations on medication regimen due to soft bp's and renal dysfunction - currently just on toprol. Recent retrial of aldactone causes drop in Na and elevation in Cr. Have not tried restarting entresto due to renal function. - HR 63, would not titrate toprol further - request pcp labs, pending renal function restart entresto vs try hydral/nitrates instead.        Arnoldo Lenis, M.D.

## 2020-12-17 NOTE — Patient Instructions (Signed)
Medication Instructions:  Your physician recommends that you continue on your current medications as directed. Please refer to the Current Medication list given to you today.  *If you need a refill on your cardiac medications before your next appointment, please call your pharmacy*   Lab Work: We have requested lab work from your primary care provider If you have labs (blood work) drawn today and your tests are completely normal, you will receive your results only by: [place] (if you have MyChart) OR A paper copy in the mail If you have any lab test that is abnormal or we need to change your treatment, we will call you to review the results.   Testing/Procedures: None   Follow-Up: At [facility], you and your health needs are our priority.  As part of our continuing mission to provide you with exceptional heart care, we have created designated Provider Care Teams.  These Care Teams include your primary Cardiologist (physician) and Advanced Practice Providers (APPs -  Physician Assistants and Nurse Practitioners) who all work together to provide you with the care you need, when you need it.  We recommend signing up for the patient portal called "MyChart".  Sign up information is provided on this After Visit Summary.  MyChart is used to connect with patients for Virtual Visits (Telemedicine).  Patients are able to view lab/test results, encounter notes, upcoming appointments, etc.  Non-urgent messages can be sent to your provider as well.   To learn more about what you can do with MyChart, go to [email or web address].    Your next appointment:   4 month(s)  The format for your next appointment:   In Person  Provider:   Carlyle Dolly, MD    Other Instructions

## 2021-01-10 ENCOUNTER — Other Ambulatory Visit: Payer: Self-pay

## 2021-01-10 ENCOUNTER — Encounter (HOSPITAL_COMMUNITY)
Admission: RE | Admit: 2021-01-10 | Discharge: 2021-01-10 | Disposition: A | Discharge status: Home / Self Care | Payer: Medicare HMO | Source: Ambulatory Visit | Attending: Ophthalmology | Admitting: Ophthalmology

## 2021-01-11 NOTE — H&P (Signed)
Surgical History & Physical  Patient Name: Melanie Bradley DOB: [birth year]  Surgery: Cataract extraction with intraocular lens implant phacoemulsification; Right Eye  Surgeon: Baruch Goldmann MD Surgery Date:  01-14-21 Pre-Op Date:  01-03-21  HPI: A 13 Yr. old female patient is referred by Dr Jorja Loa for cataract eval. Pt has Dementia, Hx taken from pt and her husband. 1. The patient complains of blurry vison OU started last year The condition's severity increased since last visit. Symptoms occur when the patient is inside and outside. This is negatively affecting the patient's quality of life. HPI Completed by Dr. Baruch Goldmann  Medical History: Dry Eyes Cataracts Recurrent corneal erosion 2012  Review of Systems Diverticulitis, Dementia High Blood Pressure Thyroid Problems  Negative Allergic/Immunologic Negative Cardiovascular Negative Constitutional Negative Ear, Nose, Mouth & Throat Negative Endocrine Negative Eyes Negative Gastrointestinal Negative Genitourinary Negative Hemotologic/Lymphatic Negative Integumentary Negative Musculoskeletal Negative Neurological Negative Psychiatry Negative Respiratory  Social   Current every day smoker   Medication Synthroid, Toprol XL, Vitamin D2, Aricep, Mementine, Donepezil, Calcium, Escitalopram, Metoprolol, Megestrol, Aspirin, B-12, Coginsonse,   Sx/Procedures Gallbladder Sx, Nasal Surgery, Broken Arm, Wrist Sx,   Drug Allergies  Sulfa, Asiphax, PCN, Codeine, Penicillin,   History & Physical: Heent: Cataract, Right Eye NECK: supple without bruits LUNGS: lungs clear to auscultation CV: regular rate and rhythm Abdomen: soft and non-tender  Impression & Plan: Assessment: 1.  NUCLEAR SCLEROSIS AGE RELATED; Both Eyes (H25.13) 2.  BLEPHARITIS; Right Upper Lid, Right Lower Lid, Left Upper Lid, Left Lower Lid (H01.001, H01.002,H01.004,H01.005) 3.  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   Cataract accounts for the patient's decreased vision. This visual impairment is not correctable with a tolerable change in glasses or contact lenses. Cataract surgery with an implantation of a new lens should significantly improve the visual and functional status of the patient. Discussed all risks, benefits, alternatives, and potential complications. Discussed the procedures and recovery. Patient desires to have surgery. A-scan ordered and performed today for intra-ocular lens calculations. The surgery will be performed in order to improve vision for driving, reading, and for eye examinations. Recommend phacoemulsification with intra-ocular lens. Recommend Dextenza for post-operative pain and inflammation. Right Eye worse - first. Dilates poorly - shugarcaine by protocol. Omidira.  2.  Recommend regular lid cleaning.  3.  Discussed condition with patient. ATs PRN

## 2021-01-14 ENCOUNTER — Ambulatory Visit (HOSPITAL_COMMUNITY): Payer: Medicare HMO | Admitting: Anesthesiology

## 2021-01-14 ENCOUNTER — Encounter (HOSPITAL_COMMUNITY): Payer: Self-pay | Admitting: Ophthalmology

## 2021-01-14 ENCOUNTER — Encounter (HOSPITAL_COMMUNITY)
Admission: RE | Disposition: A | Discharge status: Home / Self Care | Payer: Self-pay | Source: Home / Self Care | Attending: Ophthalmology

## 2021-01-14 ENCOUNTER — Other Ambulatory Visit: Payer: Self-pay

## 2021-01-14 ENCOUNTER — Ambulatory Visit (HOSPITAL_COMMUNITY)
Admission: RE | Admit: 2021-01-14 | Discharge: 2021-01-14 | Disposition: A | Discharge status: Home / Self Care | Payer: Medicare HMO | Attending: Ophthalmology | Admitting: Ophthalmology

## 2021-01-14 DIAGNOSIS — F17200 Nicotine dependence, unspecified, uncomplicated: Secondary | ICD-10-CM | POA: Insufficient documentation

## 2021-01-14 DIAGNOSIS — I10 Essential (primary) hypertension: Secondary | ICD-10-CM | POA: Insufficient documentation

## 2021-01-14 DIAGNOSIS — H2513 Age-related nuclear cataract, bilateral: Secondary | ICD-10-CM | POA: Insufficient documentation

## 2021-01-14 DIAGNOSIS — H0100A Unspecified blepharitis right eye, upper and lower eyelids: Secondary | ICD-10-CM | POA: Insufficient documentation

## 2021-01-14 DIAGNOSIS — E039 Hypothyroidism, unspecified: Secondary | ICD-10-CM | POA: Diagnosis not present

## 2021-01-14 DIAGNOSIS — H18523 Epithelial (juvenile) corneal dystrophy, bilateral: Secondary | ICD-10-CM | POA: Insufficient documentation

## 2021-01-14 DIAGNOSIS — H0100B Unspecified blepharitis left eye, upper and lower eyelids: Secondary | ICD-10-CM | POA: Diagnosis not present

## 2021-01-14 DIAGNOSIS — F0390 Unspecified dementia without behavioral disturbance: Secondary | ICD-10-CM | POA: Diagnosis not present

## 2021-01-14 HISTORY — PX: CATARACT EXTRACTION W/PHACO: SHX586

## 2021-01-14 SURGERY — PHACOEMULSIFICATION, CATARACT, WITH IOL INSERTION
Anesthesia: Monitor Anesthesia Care | Site: Eye | Laterality: Right

## 2021-01-14 MED ORDER — EPINEPHRINE PF 1 MG/ML IJ SOLN
INTRAMUSCULAR | Status: AC
  Filled 2021-01-14: qty 2

## 2021-01-14 MED ORDER — LIDOCAINE HCL (PF) 1 % IJ SOLN
INTRAOCULAR | Status: DC | PRN
  Administered 2021-01-14: 1 mL via OPHTHALMIC

## 2021-01-14 MED ORDER — EPINEPHRINE PF 1 MG/ML IJ SOLN
INTRAOCULAR | Status: DC | PRN
  Administered 2021-01-14: 500 mL

## 2021-01-14 MED ORDER — POVIDONE-IODINE 5 % OP SOLN
OPHTHALMIC | Status: DC | PRN
  Administered 2021-01-14: 1 via OPHTHALMIC

## 2021-01-14 MED ORDER — PHENYLEPHRINE HCL 2.5 % OP SOLN
1.0000 [drp] | OPHTHALMIC | Status: AC | PRN
  Administered 2021-01-14 (×3): 1 [drp] via OPHTHALMIC

## 2021-01-14 MED ORDER — BSS IO SOLN
INTRAOCULAR | Status: DC | PRN
  Administered 2021-01-14: 15 mL via INTRAOCULAR

## 2021-01-14 MED ORDER — TROPICAMIDE 1 % OP SOLN
1.0000 [drp] | OPHTHALMIC | Status: AC | PRN
  Administered 2021-01-14 (×3): 1 [drp] via OPHTHALMIC

## 2021-01-14 MED ORDER — SODIUM HYALURONATE 10 MG/ML IO SOLUTION
PREFILLED_SYRINGE | INTRAOCULAR | Status: DC | PRN
  Administered 2021-01-14: 0.85 mL via INTRAOCULAR

## 2021-01-14 MED ORDER — TETRACAINE HCL 0.5 % OP SOLN
1.0000 [drp] | OPHTHALMIC | Status: AC | PRN
  Administered 2021-01-14 (×3): 1 [drp] via OPHTHALMIC

## 2021-01-14 MED ORDER — LIDOCAINE HCL 3.5 % OP GEL
1.0000 "application " | Freq: Once | OPHTHALMIC | Status: AC
  Administered 2021-01-14: 1 via OPHTHALMIC

## 2021-01-14 MED ORDER — SODIUM HYALURONATE 23MG/ML IO SOSY
PREFILLED_SYRINGE | INTRAOCULAR | Status: DC | PRN
  Administered 2021-01-14: 0.6 mL via INTRAOCULAR

## 2021-01-14 MED ORDER — PHENYLEPHRINE-KETOROLAC 1-0.3 % IO SOLN
INTRAOCULAR | Status: AC
  Filled 2021-01-14: qty 4

## 2021-01-14 MED ORDER — STERILE WATER FOR IRRIGATION IR SOLN
Status: DC | PRN
  Administered 2021-01-14: 250 mL

## 2021-01-14 SURGICAL SUPPLY — 11 items
CLOTH BEACON ORANGE TIMEOUT ST (SAFETY) ×1 IMPLANT
EYE SHIELD UNIVERSAL CLEAR (GAUZE/BANDAGES/DRESSINGS) ×1 IMPLANT
GLOVE SURG UNDER POLY LF SZ7 (GLOVE) ×2 IMPLANT
NDL HYPO 18GX1.5 BLUNT FILL (NEEDLE) IMPLANT
NEEDLE HYPO 18GX1.5 BLUNT FILL (NEEDLE) ×2 IMPLANT
PAD ARMBOARD 7.5X6 YLW CONV (MISCELLANEOUS) ×1 IMPLANT
RayOne EMV US (Intraocular Lens) ×1 IMPLANT
SYR TB 1ML LL NO SAFETY (SYRINGE) ×1 IMPLANT
TAPE SURG TRANSPORE 1 IN (GAUZE/BANDAGES/DRESSINGS) IMPLANT
TAPE SURGICAL TRANSPORE 1 IN (GAUZE/BANDAGES/DRESSINGS) ×2
WATER STERILE IRR 250ML POUR (IV SOLUTION) ×1 IMPLANT

## 2021-01-14 NOTE — Transfer of Care (Signed)
Immediate Anesthesia Transfer of Care Note  Patient: Melanie Bradley  Procedure(s) Performed: CATARACT EXTRACTION PHACO AND INTRAOCULAR LENS PLACEMENT (IOC) (Right: Eye)  Patient Location: Short Stay  Anesthesia Type:MAC  Level of Consciousness: awake  Airway & Oxygen Therapy: Patient Spontanous Breathing  Post-op Assessment: Report given to RN  Post vital signs: Reviewed and stable  Last Vitals:  Vitals Value Taken Time  BP    Temp    Pulse    Resp    SpO2      Last Pain:  Vitals:   01/14/21 0816  TempSrc: Oral  PainSc: 0-No pain         Complications: No notable events documented.

## 2021-01-14 NOTE — Interval H&P Note (Signed)
History and Physical Interval Note:  01/14/2021 8:49 AM  Melanie Bradley  has presented today for surgery, with the diagnosis of nuclear cataract right eye.  The various methods of treatment have been discussed with the patient and family. After consideration of risks, benefits and other options for treatment, the patient has consented to  Procedure(s) with comments: CATARACT EXTRACTION PHACO AND INTRAOCULAR LENS PLACEMENT (IOC) (Right) - right as a surgical intervention.  The patient's history has been reviewed, patient examined, no change in status, stable for surgery.  I have reviewed the patient's chart and labs.  Questions were answered to the patient's satisfaction.     Baruch Goldmann

## 2021-01-14 NOTE — Anesthesia Postprocedure Evaluation (Signed)
Anesthesia Post Note  Patient: COURTENEY ALDERETE  Procedure(s) Performed: CATARACT EXTRACTION PHACO AND INTRAOCULAR LENS PLACEMENT (IOC) (Right: Eye)  Patient location during evaluation: Short Stay Anesthesia Type: MAC Level of consciousness: awake and alert Pain management: pain level controlled Vital Signs Assessment: post-procedure vital signs reviewed and stable Respiratory status: spontaneous breathing Cardiovascular status: blood pressure returned to baseline and stable Postop Assessment: no apparent nausea or vomiting Anesthetic complications: no   No notable events documented.   Last Vitals:  Vitals:   01/14/21 0816 01/14/21 0924  BP: (!) 157/101 132/82  Pulse: 72 63  Resp:  18  Temp: 37.1 C 36.5 C  SpO2: 100% 100%    Last Pain:  Vitals:   01/14/21 0924  TempSrc: Oral  PainSc: 0-No pain                 Hoyte Ziebell

## 2021-01-14 NOTE — Anesthesia Preprocedure Evaluation (Signed)
Anesthesia Evaluation  Patient identified by MRN, date of birth, ID band Patient awake and Patient confused    Reviewed: Allergy & Precautions, H&P , NPO status , Patient's Chart, lab work & pertinent test results, reviewed documented beta blocker date and time   History of Anesthesia Complications (+) history of anesthetic complications  Airway Mallampati: II  TM Distance: >3 FB Neck ROM: full    Dental no notable dental hx.    Pulmonary COPD, Current Smoker,    Pulmonary exam normal breath sounds clear to auscultation       Cardiovascular Exercise Tolerance: Good hypertension, + dysrhythmias Atrial Fibrillation  Rhythm:regular Rate:Normal     Neuro/Psych  Headaches, PSYCHIATRIC DISORDERS Dementia    GI/Hepatic negative GI ROS, Neg liver ROS,   Endo/Other  Hypothyroidism   Renal/GU Renal disease  negative genitourinary   Musculoskeletal   Abdominal   Peds  Hematology negative hematology ROS (+)   Anesthesia Other Findings   Reproductive/Obstetrics negative OB ROS                             Anesthesia Physical Anesthesia Plan  ASA: 3  Anesthesia Plan: MAC   Post-op Pain Management:    Induction:   PONV Risk Score and Plan:   Airway Management Planned:   Additional Equipment:   Intra-op Plan:   Post-operative Plan:   Informed Consent: I have reviewed the patients History and Physical, chart, labs and discussed the procedure including the risks, benefits and alternatives for the proposed anesthesia with the patient or authorized representative who has indicated his/her understanding and acceptance.     Dental Advisory Given  Plan Discussed with: CRNA  Anesthesia Plan Comments:         Anesthesia Quick Evaluation

## 2021-01-14 NOTE — Discharge Instructions (Addendum)
Please discharge patient when stable, will follow up today with Dr. Wrzosek at the [facility] [place] office immediately following discharge.  Leave shield in place until visit.  All paperwork with discharge instructions will be given at the office.  [facility] Address:  [address]  [place],  [address]  

## 2021-01-14 NOTE — Op Note (Addendum)
Date of procedure: 01/14/21  Pre-operative diagnosis: Visually significant age-related nuclear cataract, Right Eye (H25.11)  Post-operative diagnosis: Visually significant age-related nuclear cataract, Right Eye  Procedure: Removal of cataract via phacoemulsification and insertion of intra-ocular lens Rayner RAO200E +26.0D into the capsular bag of the Right Eye  Attending surgeon: Gerda Diss. Ramsey Midgett, MD, MA  Anesthesia: MAC, Topical Akten  Complications: None  Estimated Blood Loss: <26m (minimal)  Specimens: None  Implants: As above  Indications:  Visually significant age-related cataract, Right Eye  Procedure:  The patient was seen and identified in the pre-operative area. The operative eye was identified and dilated.  The operative eye was marked.  Topical anesthesia was administered to the operative eye.     The patient was then to the operative suite and placed in the supine position.  A timeout was performed confirming the patient, procedure to be performed, and all other relevant information.   The patient's face was prepped and draped in the usual fashion for intra-ocular surgery.  A lid speculum was placed into the operative eye and the surgical microscope moved into place and focused.  A superotemporal paracentesis was created using a 20 gauge paracentesis blade.  Shugarcaine was injected into the anterior chamber.  Viscoelastic was injected into the anterior chamber.  A temporal clear-corneal main wound incision was created using a 2.422mmicrokeratome.  A continuous curvilinear capsulorrhexis was initiated using an irrigating cystitome and completed using capsulorrhexis forceps.  Hydrodissection and hydrodeliniation were performed.  Viscoelastic was injected into the anterior chamber.  A phacoemulsification handpiece and a chopper as a second instrument were used to remove the nucleus and epinucleus. The irrigation/aspiration handpiece was used to remove any remaining cortical  material.   The capsular bag was reinflated with viscoelastic, checked, and found to be intact.  The intraocular lens was inserted into the capsular bag.  The irrigation/aspiration handpiece was used to remove any remaining viscoelastic.  The clear corneal wound and paracentesis wounds were then hydrated and checked with Weck-Cels to be watertight.  The lid-speculum and drape was removed, and the patient's face was cleaned with a wet and dry 4x4.  A clear shield was taped over the eye. The patient was taken to the post-operative care unit in good condition, having tolerated the procedure well.  Post-Op Instructions: [facility] and will receive all other orders and instructions.

## 2021-01-17 ENCOUNTER — Encounter (HOSPITAL_COMMUNITY): Payer: Self-pay | Admitting: Ophthalmology

## 2021-01-18 ENCOUNTER — Encounter (HOSPITAL_COMMUNITY): Payer: Self-pay

## 2021-01-18 ENCOUNTER — Encounter (HOSPITAL_COMMUNITY)
Admission: RE | Admit: 2021-01-18 | Discharge: 2021-01-18 | Disposition: A | Discharge status: Home / Self Care | Payer: Medicare HMO | Source: Ambulatory Visit | Attending: Ophthalmology | Admitting: Ophthalmology

## 2021-01-18 ENCOUNTER — Other Ambulatory Visit: Payer: Self-pay

## 2021-01-20 NOTE — H&P (Signed)
Surgical History & Physical  Patient Name: Melanie Bradley DOB: 08/13/1950  Surgery: Cataract extraction with intraocular lens implant phacoemulsification; Left Eye  Surgeon: Baruch Goldmann MD Surgery Date:  01-24-21 Pre-Op Date:  01-17-21  HPI: A 89 Yr. old female patient The patient is returning after cataract surgery. The right eye is affected. Status post cataract surgery, which began 3 days ago: 01/14/21 Since the last visit, the affected area is doing well. The patient's vision is improved. Patient is following medication instructions. Pt taking PO combo TID OD. Pt denies any eye pain or increase floaters/flashes of light. The patient complains of blurry vision in the left eye that began last year The condition's severity increased since last visit. Symptoms occur when the patient is inside and outside. This is This is negatively affecting the patient's quality of life and the patient is unable to function adequately in life with the current level of vision. HPI was performed by Baruch Goldmann .  Medical History: Dry Eyes Cataracts Recurrent corneal erosion 2012 Diverticulitis, Dementia High Blood Pressure Thyroid Problems  Review of Systems Negative Allergic/Immunologic Negative Cardiovascular Negative Constitutional Negative Ear, Nose, Mouth & Throat Negative Endocrine Negative Eyes Negative Gastrointestinal Negative Genitourinary Negative Hemotologic/Lymphatic Negative Integumentary Negative Musculoskeletal Negative Neurological Negative Psychiatry Negative Respiratory  Social   Current every day smoker   Medication Prednisolone-Moxifloxacin-Bromfenac, Synthroid, Toprol XL, Vitamin D2, Aricep, Mementine, Donepezil, Calcium, Escitalopram, Metoprolol, Megestrol, Aspirin, B-12, Coginsonse,   Sx/Procedures Phaco c IOL OD, Gallbladder Sx, Nasal Surgery, Broken Arm, Wrist Sx,   Drug Allergies  Sulfa, Asiphax, PCN, Codeine, Penicillin,   History & Physical: Heent:  Cataract, Left Eye NECK: supple without bruits LUNGS: lungs clear to auscultation CV: regular rate and rhythm Abdomen: soft and non-tender  Impression & Plan: Assessment: 1.  CATARACT EXTRACTION STATUS; Right Eye (Z98.41) 2.  NUCLEAR SCLEROSIS AGE RELATED; , Left Eye (H25.12)  Plan: 1.  1 week after cataract surgery. Doing well with improved vision and normal eye pressure. Call with any problems or concerns. Continue Pred-Moxi-Brom 2x/day for 3 more weeks.  2.  Cataract accounts for the patient's decreased vision. This visual impairment is not correctable with a tolerable change in glasses or contact lenses. Cataract surgery with an implantation of a new lens should significantly improve the visual and functional status of the patient. Discussed all risks, benefits, alternatives, and potential complications. Discussed the procedures and recovery. Patient desires to have surgery. A-scan ordered and performed today for intra-ocular lens calculations. The surgery will be performed in order to improve vision for driving, reading, and for eye examinations. Recommend phacoemulsification with intra-ocular lens. Recommend Dextenza for post-operative pain and inflammation. Left Eye. Surgery required to correct imbalance of vision. Dilates well - shugarcaine by protocol.

## 2021-01-23 ENCOUNTER — Encounter (HOSPITAL_COMMUNITY): Payer: Self-pay | Admitting: Anesthesiology

## 2021-01-24 ENCOUNTER — Encounter (HOSPITAL_COMMUNITY): Admission: RE | Payer: Self-pay | Source: Home / Self Care

## 2021-01-24 ENCOUNTER — Ambulatory Visit (HOSPITAL_COMMUNITY): Admission: RE | Admit: 2021-01-24 | Payer: Medicare HMO | Source: Home / Self Care | Admitting: Ophthalmology

## 2021-01-24 SURGERY — PHACOEMULSIFICATION, CATARACT, WITH IOL INSERTION
Anesthesia: Monitor Anesthesia Care | Laterality: Left

## 2021-01-24 MED ORDER — EPINEPHRINE PF 1 MG/ML IJ SOLN
INTRAMUSCULAR | Status: AC
  Filled 2021-01-24: qty 1

## 2021-01-24 MED ORDER — PHENYLEPHRINE-KETOROLAC 1-0.3 % IO SOLN
INTRAOCULAR | Status: AC
  Filled 2021-01-24: qty 4

## 2021-04-11 ENCOUNTER — Encounter (HOSPITAL_COMMUNITY)
Admission: RE | Admit: 2021-04-11 | Discharge: 2021-04-11 | Disposition: A | Discharge status: Home / Self Care | Payer: Medicare HMO | Source: Ambulatory Visit | Attending: Ophthalmology | Admitting: Ophthalmology

## 2021-04-11 ENCOUNTER — Encounter (HOSPITAL_COMMUNITY): Payer: Self-pay

## 2021-04-12 NOTE — H&P (Signed)
Surgical History & Physical  Patient Name: Melanie Bradley DOB: Sep 06, 1950  Surgery: Cataract extraction with intraocular lens implant phacoemulsification; Left Eye  Surgeon: Baruch Goldmann MD Surgery Date:  04-15-21 Pre-Op Date:  04-04-21  HPI: A 52 Yr. old female patient The patient is returning after cataract surgery. The right eye is affected. Status post cataract surgery, which began 3 days ago: 01/14/21 Since the last visit, the affected area is doing well. The patient's vision is improved. Patient is following medication instructions. Pt taking PO combo TID OD. Pt denies any eye pain or increase floaters/flashes of light. The patient complains of blurry vision in the left eye that began last year The condition's severity increased since last visit. Symptoms occur when the patient is inside and outside. This is This is negatively affecting the patient's quality of life and the patient is unable to function adequately in life with the current level of vision. HPI was performed by Baruch Goldmann .  Medical History: Dry Eyes Cataracts Recurrent corneal erosion 2012 Diverticulitis, Dementia High Blood Pressure Thyroid Problems  Review of Systems Negative Allergic/Immunologic Negative Cardiovascular Negative Constitutional Negative Ear, Nose, Mouth & Throat Negative Endocrine Negative Eyes Negative Gastrointestinal Negative Genitourinary Negative Hemotologic/Lymphatic Negative Integumentary Negative Musculoskeletal Negative Neurological Negative Psychiatry Negative Respiratory  Social   Current every day smoker   Medication Prednisolone-Moxifloxacin-Bromfenac, Synthroid, Toprol XL, Vitamin D2, Aricep, Mementine, Donepezil, Calcium, Escitalopram, Metoprolol, Megestrol, Aspirin, B-12, Coginsonse,   Sx/Procedures Phaco c IOL OD, Gallbladder Sx, Nasal Surgery, Broken Arm, Wrist Sx,   Drug Allergies  Sulfa, Asiphax, PCN, Codeine, Penicillin,   History & Physical: Heent:  Cataract, Left Eye NECK: supple without bruits LUNGS: lungs clear to auscultation CV: regular rate and rhythm Abdomen: soft and non-tender  Impression & Plan: Assessment: 1.  CATARACT EXTRACTION STATUS; Right Eye (Z98.41) 2.  NUCLEAR SCLEROSIS AGE RELATED; , Left Eye (H25.12)  Plan: 1.  1 week after cataract surgery. Doing well with improved vision and normal eye pressure. Call with any problems or concerns. Continue Pred-Moxi-Brom 2x/day for 3 more weeks.  2.  Cataract accounts for the patient's decreased vision. This visual impairment is not correctable with a tolerable change in glasses or contact lenses. Cataract surgery with an implantation of a new lens should significantly improve the visual and functional status of the patient. Discussed all risks, benefits, alternatives, and potential complications. Discussed the procedures and recovery. Patient desires to have surgery. A-scan ordered and performed today for intra-ocular lens calculations. The surgery will be performed in order to improve vision for driving, reading, and for eye examinations. Recommend phacoemulsification with intra-ocular lens. Recommend Dextenza for post-operative pain and inflammation. Left Eye. Surgery required to correct imbalance of vision. Dilates well - shugarcaine by protocol.

## 2021-04-15 ENCOUNTER — Ambulatory Visit (HOSPITAL_COMMUNITY): Payer: Medicare HMO | Admitting: Anesthesiology

## 2021-04-15 ENCOUNTER — Encounter (HOSPITAL_COMMUNITY)
Admission: RE | Disposition: A | Discharge status: Home / Self Care | Payer: Self-pay | Source: Home / Self Care | Attending: Ophthalmology

## 2021-04-15 ENCOUNTER — Ambulatory Visit (HOSPITAL_COMMUNITY)
Admission: RE | Admit: 2021-04-15 | Discharge: 2021-04-15 | Disposition: A | Discharge status: Home / Self Care | Payer: Medicare HMO | Attending: Ophthalmology | Admitting: Ophthalmology

## 2021-04-15 ENCOUNTER — Ambulatory Visit (HOSPITAL_BASED_OUTPATIENT_CLINIC_OR_DEPARTMENT_OTHER): Payer: Medicare HMO | Admitting: Anesthesiology

## 2021-04-15 DIAGNOSIS — F0390 Unspecified dementia without behavioral disturbance: Secondary | ICD-10-CM | POA: Diagnosis not present

## 2021-04-15 DIAGNOSIS — Z79899 Other long term (current) drug therapy: Secondary | ICD-10-CM | POA: Insufficient documentation

## 2021-04-15 DIAGNOSIS — I10 Essential (primary) hypertension: Secondary | ICD-10-CM | POA: Insufficient documentation

## 2021-04-15 DIAGNOSIS — R519 Headache, unspecified: Secondary | ICD-10-CM | POA: Insufficient documentation

## 2021-04-15 DIAGNOSIS — H25812 Combined forms of age-related cataract, left eye: Secondary | ICD-10-CM | POA: Diagnosis present

## 2021-04-15 DIAGNOSIS — J449 Chronic obstructive pulmonary disease, unspecified: Secondary | ICD-10-CM

## 2021-04-15 DIAGNOSIS — Z9841 Cataract extraction status, right eye: Secondary | ICD-10-CM | POA: Insufficient documentation

## 2021-04-15 DIAGNOSIS — E039 Hypothyroidism, unspecified: Secondary | ICD-10-CM | POA: Insufficient documentation

## 2021-04-15 HISTORY — PX: CATARACT EXTRACTION W/PHACO: SHX586

## 2021-04-15 SURGERY — PHACOEMULSIFICATION, CATARACT, WITH IOL INSERTION
Anesthesia: Monitor Anesthesia Care | Site: Eye | Laterality: Left

## 2021-04-15 MED ORDER — LIDOCAINE HCL (PF) 2 % IJ SOLN
INTRAMUSCULAR | Status: AC
  Filled 2021-04-15: qty 5

## 2021-04-15 MED ORDER — TROPICAMIDE 1 % OP SOLN
1.0000 [drp] | OPHTHALMIC | Status: AC | PRN
  Administered 2021-04-15 (×3): 1 [drp] via OPHTHALMIC
  Filled 2021-04-15: qty 2

## 2021-04-15 MED ORDER — EPINEPHRINE PF 1 MG/ML IJ SOLN
INTRAMUSCULAR | Status: AC
  Filled 2021-04-15: qty 2

## 2021-04-15 MED ORDER — POVIDONE-IODINE 5 % OP SOLN
OPHTHALMIC | Status: DC | PRN
  Administered 2021-04-15: 1 via OPHTHALMIC

## 2021-04-15 MED ORDER — SODIUM HYALURONATE 23MG/ML IO SOSY
PREFILLED_SYRINGE | INTRAOCULAR | Status: DC | PRN
  Administered 2021-04-15: 0.6 mL via INTRAOCULAR

## 2021-04-15 MED ORDER — EPHEDRINE 5 MG/ML INJ
INTRAVENOUS | Status: AC
  Filled 2021-04-15: qty 5

## 2021-04-15 MED ORDER — PROPOFOL 10 MG/ML IV BOLUS
INTRAVENOUS | Status: DC | PRN
  Administered 2021-04-15: 150 mg via INTRAVENOUS

## 2021-04-15 MED ORDER — GLYCOPYRROLATE PF 0.2 MG/ML IJ SOSY
PREFILLED_SYRINGE | INTRAMUSCULAR | Status: DC | PRN
  Administered 2021-04-15: .1 mg via INTRAVENOUS

## 2021-04-15 MED ORDER — TETRACAINE HCL 0.5 % OP SOLN
1.0000 [drp] | OPHTHALMIC | Status: AC | PRN
  Administered 2021-04-15 (×3): 1 [drp] via OPHTHALMIC

## 2021-04-15 MED ORDER — STERILE WATER FOR IRRIGATION IR SOLN
Status: DC | PRN
  Administered 2021-04-15: 250 mL

## 2021-04-15 MED ORDER — FENTANYL CITRATE (PF) 100 MCG/2ML IJ SOLN
INTRAMUSCULAR | Status: AC
  Filled 2021-04-15: qty 2

## 2021-04-15 MED ORDER — GLYCOPYRROLATE PF 0.2 MG/ML IJ SOSY
PREFILLED_SYRINGE | INTRAMUSCULAR | Status: AC
  Filled 2021-04-15: qty 1

## 2021-04-15 MED ORDER — LIDOCAINE 2% (20 MG/ML) 5 ML SYRINGE
INTRAMUSCULAR | Status: DC | PRN
  Administered 2021-04-15: 60 mg via INTRAVENOUS

## 2021-04-15 MED ORDER — PROPOFOL 10 MG/ML IV BOLUS
INTRAVENOUS | Status: AC
  Filled 2021-04-15: qty 20

## 2021-04-15 MED ORDER — BSS IO SOLN
INTRAOCULAR | Status: DC | PRN
Start: 2021-04-15 — End: 2021-04-15
  Administered 2021-04-15: 15 mL via INTRAOCULAR

## 2021-04-15 MED ORDER — ONDANSETRON HCL 4 MG/2ML IJ SOLN
INTRAMUSCULAR | Status: DC | PRN
Start: 2021-04-15 — End: 2021-04-15
  Administered 2021-04-15: 4 mg via INTRAVENOUS

## 2021-04-15 MED ORDER — LACTATED RINGERS IV SOLN: INTRAVENOUS | Status: DC | PRN

## 2021-04-15 MED ORDER — EPHEDRINE SULFATE-NACL 50-0.9 MG/10ML-% IV SOSY
PREFILLED_SYRINGE | INTRAVENOUS | Status: DC | PRN
Start: 2021-04-15 — End: 2021-04-15
  Administered 2021-04-15: 5 mg via INTRAVENOUS

## 2021-04-15 MED ORDER — FENTANYL CITRATE (PF) 100 MCG/2ML IJ SOLN
INTRAMUSCULAR | Status: DC | PRN
  Administered 2021-04-15 (×2): 25 ug via INTRAVENOUS

## 2021-04-15 MED ORDER — LIDOCAINE HCL 3.5 % OP GEL
1.0000 "application " | Freq: Once | OPHTHALMIC | Status: AC
  Administered 2021-04-15: 1 via OPHTHALMIC

## 2021-04-15 MED ORDER — EPINEPHRINE PF 1 MG/ML IJ SOLN
INTRAOCULAR | Status: DC | PRN
  Administered 2021-04-15: 1 mL via OPHTHALMIC

## 2021-04-15 MED ORDER — PHENYLEPHRINE 40 MCG/ML (10ML) SYRINGE FOR IV PUSH (FOR BLOOD PRESSURE SUPPORT)
PREFILLED_SYRINGE | INTRAVENOUS | Status: AC
  Filled 2021-04-15: qty 10

## 2021-04-15 MED ORDER — SODIUM HYALURONATE 10 MG/ML IO SOLUTION
PREFILLED_SYRINGE | INTRAOCULAR | Status: DC | PRN
  Administered 2021-04-15: 0.85 mL via INTRAOCULAR

## 2021-04-15 MED ORDER — PHENYLEPHRINE HCL 2.5 % OP SOLN
1.0000 [drp] | OPHTHALMIC | Status: AC | PRN
  Administered 2021-04-15 (×3): 1 [drp] via OPHTHALMIC

## 2021-04-15 MED ORDER — PHENYLEPHRINE 40 MCG/ML (10ML) SYRINGE FOR IV PUSH (FOR BLOOD PRESSURE SUPPORT)
PREFILLED_SYRINGE | INTRAVENOUS | Status: DC | PRN
  Administered 2021-04-15 (×2): 80 ug via INTRAVENOUS

## 2021-04-15 MED ORDER — EPINEPHRINE PF 1 MG/ML IJ SOLN
INTRAOCULAR | Status: DC | PRN
  Administered 2021-04-15: 500 mL

## 2021-04-15 MED ORDER — ONDANSETRON HCL 4 MG/2ML IJ SOLN
INTRAMUSCULAR | Status: AC
  Filled 2021-04-15: qty 2

## 2021-04-15 SURGICAL SUPPLY — 17 items
CATARACT SUITE SIGHTPATH (MISCELLANEOUS) ×2 IMPLANT
CLOTH BEACON ORANGE TIMEOUT ST (SAFETY) ×2 IMPLANT
EYE SHIELD UNIVERSAL CLEAR (GAUZE/BANDAGES/DRESSINGS) ×1 IMPLANT
FEE CATARACT SUITE SIGHTPATH (MISCELLANEOUS) ×1 IMPLANT
GLOVE SURG UNDER POLY LF SZ6.5 (GLOVE) ×2 IMPLANT
GLOVE SURG UNDER POLY LF SZ7 (GLOVE) ×1 IMPLANT
GOWN STRL REUS W/TWL LRG LVL3 (GOWN DISPOSABLE) ×1 IMPLANT
LENS IOL RAYNER 28.5 (Intraocular Lens) ×2 IMPLANT
LENS IOL RAYONE EMV 28.5 (Intraocular Lens) IMPLANT
NDL HYPO 18GX1.5 BLUNT FILL (NEEDLE) ×1 IMPLANT
NEEDLE HYPO 18GX1.5 BLUNT FILL (NEEDLE) ×2 IMPLANT
PAD ARMBOARD 7.5X6 YLW CONV (MISCELLANEOUS) ×2 IMPLANT
RING MALYGIN 7.0 (MISCELLANEOUS) IMPLANT
SYR TB 1ML LL NO SAFETY (SYRINGE) ×2 IMPLANT
TAPE SURG TRANSPORE 1 IN (GAUZE/BANDAGES/DRESSINGS) IMPLANT
TAPE SURGICAL TRANSPORE 1 IN (GAUZE/BANDAGES/DRESSINGS) ×2
WATER STERILE IRR 250ML POUR (IV SOLUTION) ×2 IMPLANT

## 2021-04-15 NOTE — Anesthesia Procedure Notes (Addendum)
Procedure Name: LMA Insertion ?Date/Time: 04/15/2021 12:40 PM ?Performed by: Orlie Dakin, CRNA ?Pre-anesthesia Checklist: Patient identified, Emergency Drugs available, Suction available and Patient being monitored ?Patient Re-evaluated:Patient Re-evaluated prior to induction ?Oxygen Delivery Method: Circle system utilized ?Preoxygenation: Pre-oxygenation with 100% oxygen ?Induction Type: IV induction ?LMA: LMA inserted ?LMA Size: 4.0 ?Tube type: Oral ?Number of attempts: 1 ?Placement Confirmation: positive ETCO2 ?Tube secured with: Tape ?Dental Injury: Teeth and Oropharynx as per pre-operative assessment  ? ? ? ? ?

## 2021-04-15 NOTE — Anesthesia Preprocedure Evaluation (Addendum)
Anesthesia Evaluation  ?Patient identified by MRN, date of birth, ID band ?Patient awake ? ? ? ?Reviewed: ?Allergy & Precautions, NPO status , Patient's Chart, lab work & pertinent test results, reviewed documented beta blocker date and time  ? ?History of Anesthesia Complications ?(+) PROLONGED EMERGENCE and history of anesthetic complications ? ?Airway ?Mallampati: II ? ?TM Distance: >3 FB ?Neck ROM: Full ? ? ? Dental ? ?(+) Dental Advisory Given, Upper Dentures ?  ?Pulmonary ?COPD, Current Smoker and Patient abstained from smoking.,  ?  ?Pulmonary exam normal ?breath sounds clear to auscultation ? ? ? ? ? ? Cardiovascular ?hypertension, Pt. on medications and Pt. on home beta blockers ?Normal cardiovascular exam+ dysrhythmias Atrial Fibrillation  ?Rhythm:Regular Rate:Normal ? ? ?  ?Neuro/Psych ? Headaches, PSYCHIATRIC DISORDERS Dementia   ? GI/Hepatic ?Neg liver ROS, hiatal hernia, GERD  Controlled,  ?Endo/Other  ?Hypothyroidism  ? Renal/GU ?Renal disease  ?negative genitourinary ?  ?Musculoskeletal ?negative musculoskeletal ROS ?(+)  ? Abdominal ?  ?Peds ?negative pediatric ROS ?(+)  Hematology ? ?(+) Blood dyscrasia, anemia ,   ?Anesthesia Other Findings ? ? Reproductive/Obstetrics ?negative OB ROS ? ?  ? ? ? ? ? ? ? ? ? ? ? ? ? ?  ?  ? ? ? ? ? ? ? ?Anesthesia Physical ?Anesthesia Plan ? ?ASA: 3 ? ?Anesthesia Plan: MAC and General  ? ?Post-op Pain Management: Minimal or no pain anticipated  ? ?Induction: Intravenous ? ?PONV Risk Score and Plan: Treatment may vary due to age or medical condition ? ?Airway Management Planned: Nasal Cannula, Natural Airway and LMA ? ?Additional Equipment:  ? ?Intra-op Plan:  ? ?Post-operative Plan:  ? ?Informed Consent: I have reviewed the patients History and Physical, chart, labs and discussed the procedure including the risks, benefits and alternatives for the proposed anesthesia with the patient or authorized representative who has indicated  his/her understanding and acceptance.  ? ? ? ?Dental advisory given ? ?Plan Discussed with: CRNA and Surgeon ? ?Anesthesia Plan Comments: (Patient was not cooperative in [place], discussed with her husband, agreed to get GA with airway)  ? ? ? ? ?Anesthesia Quick Evaluation ? ?

## 2021-04-15 NOTE — Anesthesia Postprocedure Evaluation (Signed)
Anesthesia Post Note ? ?Patient: Melanie Bradley ? ?Procedure(s) Performed: CATARACT EXTRACTION PHACO AND INTRAOCULAR LENS PLACEMENT (IOC) (Left: Eye) ? ?Patient location during evaluation: Phase II ?Anesthesia Type: General ?Level of consciousness: awake and alert ?Pain management: pain level controlled ?Vital Signs Assessment: post-procedure vital signs reviewed and stable ?Respiratory status: spontaneous breathing, nonlabored ventilation and respiratory function stable ?Cardiovascular status: blood pressure returned to baseline and stable ?Postop Assessment: no apparent nausea or vomiting ?Anesthetic complications: no ? ? ?No notable events documented. ? ? ?Last Vitals:  ?Vitals:  ? 04/15/21 1310 04/15/21 1318  ?BP: (!) 145/92 (!) 128/101  ?Pulse: (!) 109 (!) 104  ?Resp: 18 17  ?Temp:    ?SpO2: 100% 100%  ?  ?Last Pain:  ?Vitals:  ? 04/15/21 1318  ?PainSc: 0-No pain  ? ? ?  ?  ?  ?  ?  ?  ? ?Kayana Thoen C Lajuana Patchell ? ? ? ? ?

## 2021-04-15 NOTE — Discharge Instructions (Addendum)
Please discharge patient when stable, will follow up today with Dr. Wrzosek at the [facility] [place] office immediately following discharge.  Leave shield in place until visit.  All paperwork with discharge instructions will be given at the office.  [facility] [place] Address:  [address]  , [place] [address]  

## 2021-04-15 NOTE — Transfer of Care (Signed)
Immediate Anesthesia Transfer of Care Note ? ?Patient: Melanie Bradley ? ?Procedure(s) Performed: CATARACT EXTRACTION PHACO AND INTRAOCULAR LENS PLACEMENT (IOC) (Left: Eye) ? ?Patient Location: PACU ? ?Anesthesia Type:General ? ?Level of Consciousness: drowsy ? ?Airway & Oxygen Therapy: Patient Spontanous Breathing and Patient connected to face mask oxygen ? ?Post-op Assessment: Report given to RN and Post -op Vital signs reviewed and stable ? ?Post vital signs: Reviewed and stable ? ?Last Vitals:  ?Vitals Value Taken Time  ?BP 149/104 04/15/21 1302  ?Temp    ?Pulse    ?Resp 10 04/15/21 1304  ?SpO2    ?Vitals shown include unvalidated device data. ? ?Last Pain:  ?Vitals:  ? 04/15/21 1130  ?PainSc: 0-No pain  ?   ? ?  ? ?Complications: No notable events documented. ?

## 2021-04-15 NOTE — Interval H&P Note (Signed)
History and Physical Interval Note: ? ?04/15/2021 ?11:41 AM ? ?CHE BELOW  has presented today for surgery, with the diagnosis of nuclear sclerosis age related cataract; left eye.  The various methods of treatment have been discussed with the patient and family. After consideration of risks, benefits and other options for treatment, the patient has consented to  Procedure(s) with comments: ?CATARACT EXTRACTION PHACO AND INTRAOCULAR LENS PLACEMENT (IOC) (Left) - CDE:  as a surgical intervention.  The patient's history has been reviewed, patient examined, no change in status, stable for surgery.  I have reviewed the patient's chart and labs.  Questions were answered to the patient's satisfaction.   ? ? ?Baruch Goldmann ? ? ?

## 2021-04-15 NOTE — Op Note (Signed)
Date of procedure: 04/15/21 ? 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 ?Post-operative diagnosis: Visually significant age-related combined cataract, Left Eye (H25.812) ? ?Procedure: Removal of cataract via phacoemulsification and insertion of intra-ocular lens Rayner [id number] +28.5D into the capsular bag of the Left Eye ? ?Attending surgeon: Gerda Diss. Marisa Hua, MD, MA ? ?Anesthesia: MAC, Topical Akten, General ? ?Complications: None ? ?Estimated Blood Loss: <45m (minimal) ? ?Specimens: None ? ?Implants: As above ? ?Indications:  Visually significant age-related cataract, Left Eye ? ?Procedure:  ?The patient was seen and identified in the pre-operative area. The operative eye was identified and dilated.  The operative eye was marked.  Topical anesthesia was administered to the operative eye.    ? ?The patient was then to the operative suite and placed in the supine position. General anesthesia was administered by the anesthesia team. A timeout was performed confirming the patient, procedure to be performed, and all other relevant information.   The patient's face was prepped and draped in the usual fashion for intra-ocular surgery.  A lid speculum was placed into the operative eye and the surgical microscope moved into place and focused.  An inferotemporal paracentesis was created using a 20 gauge paracentesis blade.  Shugarcaine was injected into the anterior chamber.  Viscoelastic was injected into the anterior chamber.  A temporal clear-corneal main wound incision was created using a 2.433mmicrokeratome.  A continuous curvilinear capsulorrhexis was initiated using an irrigating cystitome and completed using capsulorrhexis forceps.  Hydrodissection and hydrodeliniation were performed.  Viscoelastic was injected into the anterior chamber.  A phacoemulsification handpiece and a chopper as a second instrument were used to remove the nucleus and epinucleus. The  irrigation/aspiration handpiece was used to remove any remaining cortical material.  ? ?The capsular bag was reinflated with viscoelastic, checked, and found to be intact.  The intraocular lens was inserted into the capsular bag.  The irrigation/aspiration handpiece was used to remove any remaining viscoelastic.  The clear corneal wound and paracentesis wounds were then hydrated and checked with Weck-Cels to be watertight. The lid-speculum was removed.  The drape was removed.  The patient's face was cleaned with a wet and dry 4x4.    A clear shield was taped over the eye. The patient was taken to the post-operative care unit in good condition, having tolerated the procedure well. ? ?Post-Op Instructions: [facility] and will receive all other orders and instructions. ? ?

## 2021-04-15 NOTE — Addendum Note (Signed)
Addendum  created 04/15/21 1448 by Orlie Dakin, CRNA  ? Clinical Note Signed, Flowsheet accepted, Intraprocedure Blocks edited  ?  ?

## 2021-04-19 ENCOUNTER — Encounter (HOSPITAL_COMMUNITY): Payer: Self-pay | Admitting: Ophthalmology

## 2021-04-22 ENCOUNTER — Ambulatory Visit: Payer: Medicare HMO | Admitting: Cardiology

## 2021-04-22 ENCOUNTER — Other Ambulatory Visit: Payer: Self-pay

## 2021-04-22 ENCOUNTER — Encounter: Payer: Self-pay | Admitting: Cardiology

## 2021-04-22 ENCOUNTER — Other Ambulatory Visit (HOSPITAL_COMMUNITY)
Admission: RE | Admit: 2021-04-22 | Discharge: 2021-04-22 | Disposition: A | Discharge status: Home / Self Care | Payer: Medicare HMO | Source: Ambulatory Visit | Attending: Cardiology | Admitting: Cardiology

## 2021-04-22 VITALS — BP 116/76 | HR 74 | Ht 61.0 in | Wt 137.0 lb

## 2021-04-22 DIAGNOSIS — I10 Essential (primary) hypertension: Secondary | ICD-10-CM

## 2021-04-22 DIAGNOSIS — I5022 Chronic systolic (congestive) heart failure: Secondary | ICD-10-CM | POA: Diagnosis not present

## 2021-04-22 DIAGNOSIS — I110 Hypertensive heart disease with heart failure: Secondary | ICD-10-CM | POA: Insufficient documentation

## 2021-04-22 LAB — BASIC METABOLIC PANEL
Anion gap: 8 (ref 5–15)
BUN: 24 mg/dL — ABNORMAL HIGH (ref 8–23)
CO2: 27 mmol/L (ref 22–32)
Calcium: 8.8 mg/dL — ABNORMAL LOW (ref 8.9–10.3)
Chloride: 102 mmol/L (ref 98–111)
Creatinine, Ser: 1.83 mg/dL — ABNORMAL HIGH (ref 0.44–1.00)
GFR, Estimated: 30 mL/min — ABNORMAL LOW (ref >60)
Glucose, Bld: 98 mg/dL (ref 70–99)
Potassium: 4.1 mmol/L (ref 3.5–5.1)
Sodium: 137 mmol/L (ref 135–145)

## 2021-04-22 LAB — MAGNESIUM: Magnesium: 2.1 mg/dL (ref 1.7–2.4)

## 2021-04-22 NOTE — Progress Notes (Signed)
Clinical Summary Melanie Bradley is a 71 y.o.female seen today for follow up of the following medical problems.      1. Chronic systolic HF - new diagnosis during 10/2018 admission with syncope. Echo showed LVEF 35-40% - no recent SOb/DOE. Compliant with meds - bp is 114/75, HR 64 today     Jan 2021 echo LVEF 35-40%, mild to mod MR.    04/2019 no significant CAD, normal filling pressures. CI 3.7 Mean PA 15, PCWP 7 07/2019 CMRI: LVEF 38%, normal RV. No infiltrative disease       -has had recent long term GI issues, partly chronic and partly exacerbated by recent COVID infection. Has lost 13 lbs, chronic intermittent diarrhea that has led to low bp's and intermittent issues with dehydration. Recent admission with dehydration, significant AKI - off all CHF meds due to low bp's, recent AKI     - we restarted aldactone, caused decrease in sodium and elevation in Cr. We discontinued  - no SOB/DOE. No LE edema - compliant with med       2. Chronic LBBB - no significant CAD by cath 04/2019       3.  Memory deficit/Dementia - on aricept, followed by neuro -remains active, independent. Enjoys doing yard and housework.      4. 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 We have labs from pcp 03/2021 but she and husband report she has not had any blood work in the last few months, unclear situation.      Past Medical History:  Diagnosis Date   Atrial fibrillation (HCC)    Bloating    Complication of anesthesia     " hard to wake up sometimes "   Dementia ([place])    Diverticula of colon    Epigastric burning sensation    GERD (gastroesophageal reflux disease)    Headache disorder 06/03/2014   IRCVELFY(101.7)    Helicobacter pylori gastritis 2004   Hemorrhoids 06/26/03   Dr. Laural Golden tcs   Hiatal hernia 06/26/03   Dr. Laural Golden egd   Hypertension     Hypothyroidism    Left bundle Tyniya Kuyper block    rate dependent LBBB 04/2013   Memory difficulty 06/03/2014   Shoulder fracture, left    Spastic colon    Wrist fracture      Allergies  Allergen Reactions   Penicillins Hives and Swelling    Has patient had a PCN reaction causing IMMEDIATE RASH, FACIAL/TONGUE/THROAT SWELLING, SOB, OR LIGHTHEADEDNESS WITH HYPOTENSION:  #  #  #  YES  #  #  #  Has patient had a PCN reaction causing severe rash involving mucus membranes or skin necrosis: No Has patient had a PCN reaction that required hospitalization No Has patient had a PCN reaction occurring within the last 10 years: No If all of the above answers are "NO", then may proceed with Cephalosporin use.    Benadryl [Diphenhydramine Hcl] Hives   Msm [Methylsulfonylmethane] Hives   Nubain [Nalbuphine Hcl] Hives   Sulfa Antibiotics Hives   Codeine     UNSPECIFIED REACTION [IF AT ALL] REFUSES TO TAKE     Current Outpatient Medications  Medication Sig Dispense Refill   acetaminophen (TYLENOL) 500 MG tablet Take 1,000 mg by mouth every 6 (six) hours as needed (headaches.).     alendronate (FOSAMAX) 70 MG  tablet Take 70 mg by mouth every Tuesday.      aspirin EC 81 MG tablet Take 81 mg by mouth daily.     Calcium Carb-Cholecalciferol (CALCIUM 600+D3 PO) Take 1 tablet by mouth daily with supper.     Cyanocobalamin (B-12 PO) Take 6,000 mcg by mouth daily.     donepezil (ARICEPT) 10 MG tablet Take 1 tablet (10 mg total) by mouth at bedtime. 90 tablet 3   escitalopram (LEXAPRO) 10 MG tablet Take 10 mg by mouth daily with supper.   0   megestrol (MEGACE) 20 MG tablet Take 20 mg by mouth 2 (two) times daily.     memantine (NAMENDA) 10 MG tablet Take 1 tablet (10 mg total) by mouth 2 (two) times daily. 180 tablet 3   metoprolol succinate (TOPROL XL) 25 MG 24 hr tablet Take 1 tablet (25 mg total) by mouth daily. 90 tablet 3   Specialty Vitamins Products (BRAIN PO) Take 1 tablet by mouth daily. Cogni[employer]ense  Supplement     SYNTHROID 88 MCG tablet Take 88 mcg by mouth See admin instructions. Take 1 tablet (88 mcg) by mouth on Sundays, Mondays, Tuesdays, Wednesdays,Thursdays, & Saturdays at bedtime, DOES NOT TAKE ANY Fridays.     Current Facility-Administered Medications  Medication Dose Route Frequency Provider Last Rate Last Admin   sodium chloride flush (NS) 0.9 % injection 3 mL  3 mL Intravenous Q12H Arnoldo Lenis, MD         Past Surgical History:  Procedure Laterality Date   APPENDECTOMY     CATARACT EXTRACTION W/PHACO Right 01/14/2021   Procedure: CATARACT EXTRACTION PHACO AND INTRAOCULAR LENS PLACEMENT ([place]);  Surgeon: Baruch Goldmann, MD;  Location: [facility];  Service: Ophthalmology;  Laterality: Right;  CDE 8.87   CATARACT EXTRACTION W/PHACO Left 04/15/2021   Procedure: CATARACT EXTRACTION PHACO AND INTRAOCULAR LENS PLACEMENT ([facility]);  Surgeon: Baruch Goldmann, MD;  Location: [facility];  Service: Ophthalmology;  Laterality: Left;  CDE: 8.19   CHOLECYSTECTOMY     COLONOSCOPY  06/26/03   Dr. Kem Boroughs diverticula at the sigmoid colon with one of the transverse colon, normal terminal ileoscopy, small external hemorrhoids the   COLONOSCOPY N/A 01/20/2016   sigmoid diverticulosis, otherwise normal.    ESOPHAGOGASTRODUODENOSCOPY  03/15/2011   Dr. Trevor Iha hiatal hernia, abnormal gastric mucosa of unclear significance. Gastric biopsies negative, no H. pylori.Procedure: ESOPHAGOGASTRODUODENOSCOPY (EGD);  Surgeon: Daneil Dolin, MD;  [facility];  Service: Endoscopy;  Laterality: N/A;  7:30   ORIF HUMERUS FRACTURE Left 05/29/2016   Procedure: LEFT OPEN REDUCTION INTERNAL FIXATION (ORIF) PROXIMAL HUMERUS FRACTURE;  Surgeon: Meredith Pel, MD;  Location: [place];  Service: Orthopedics;  Laterality: Left;   ORIF WRIST FRACTURE Left 05/29/2016   Procedure: OPEN REDUCTION INTERNAL FIXATION (ORIF) LEFT WRIST FRACTURE;  Surgeon: Meredith Pel, MD;  Location: [place];  Service:  Orthopedics;  Laterality: Left;   RIGHT/LEFT HEART CATH AND CORONARY ANGIOGRAPHY N/A 04/17/2019   Procedure: RIGHT/LEFT HEART CATH AND CORONARY ANGIOGRAPHY;  Surgeon: Belva Crome, MD;  Location: [place] [facility];  Service: Cardiovascular;  Laterality: N/A;   TUBAL LIGATION     UPPER GASTROINTESTINAL ENDOSCOPY  06/26/03   Dr. Wynelle Bourgeois sliding hiatal hernia with 8 mm tongue of gastric type mucosa at distal esophagus bile in the stomach.     Allergies  Allergen Reactions   Penicillins Hives and Swelling    Has patient had a PCN reaction causing IMMEDIATE RASH, FACIAL/TONGUE/THROAT SWELLING, SOB, OR LIGHTHEADEDNESS  WITH HYPOTENSION:  #  #  #  YES  #  #  #  Has patient had a PCN reaction causing severe rash involving mucus membranes or skin necrosis: No Has patient had a PCN reaction that required hospitalization No Has patient had a PCN reaction occurring within the last 10 years: No If all of the above answers are "NO", then may proceed with Cephalosporin use.    Benadryl [Diphenhydramine Hcl] Hives   Msm [Methylsulfonylmethane] Hives   Nubain [Nalbuphine Hcl] Hives   Sulfa Antibiotics Hives   Codeine     UNSPECIFIED REACTION [IF AT ALL] REFUSES TO TAKE      Family History  Problem Relation Age of Onset   Hypertension Mother    Angina Mother    COPD Father    Heart failure Father    Cancer Sister    Dementia Sister    Cardiomyopathy Brother    Pneumonia Other    Migraines Sister    Colon cancer Neg Hx      Social History Ms. Ace reports that she has been smoking cigarettes. She started smoking about 54 years ago. She has a 45.00 pack-year smoking history. She has never used smokeless tobacco. Ms. Ortner reports no history of alcohol use.   Review of Systems CONSTITUTIONAL: No weight loss, fever, chills, weakness or fatigue.  HEENT: Eyes: No visual loss, blurred vision, double vision or yellow sclerae.No hearing loss, sneezing, congestion, runny nose or  sore throat.  SKIN: No rash or itching.  CARDIOVASCULAR: per hpi RESPIRATORY: No shortness of breath, cough or sputum.  GASTROINTESTINAL: No anorexia, nausea, vomiting or diarrhea. No abdominal pain or blood.  GENITOURINARY: No burning on urination, no polyuria NEUROLOGICAL: No headache, dizziness, syncope, paralysis, ataxia, numbness or tingling in the extremities. No change in bowel or bladder control.  MUSCULOSKELETAL: No muscle, back pain, joint pain or stiffness.  LYMPHATICS: No enlarged nodes. No history of splenectomy.  PSYCHIATRIC: No history of depression or anxiety.  ENDOCRINOLOGIC: No reports of sweating, cold or heat intolerance. No polyuria or polydipsia.  Marland Kitchen   Physical Examination Today's Vitals   04/22/21 1009  BP: 116/76  Pulse: 74  SpO2: 98%  Weight: 137 lb (62.1 kg)  Height: '5\' 1"'$  (1.549 m)   Body mass index is 25.89 kg/m.  Gen: resting comfortably, no acute distress HEENT: no scleral icterus, pupils equal round and reactive, no palptable cervical adenopathy,  CV: RRR, no m/r/g no jvd Resp: Clear to auscultation bilaterally GI: abdomen is soft, non-tender, non-distended, normal bowel sounds, no hepatosplenomegaly MSK: extremities are warm, no edema.  Skin: warm, no rash Neuro:  no focal deficits Psych: appropriate affect   Diagnostic Studies  04/2013 Lexiscan MPI FINDINGS:   The patient's initial EKG revealed a narrow QRS. After she received   Lexiscan, the heart rate increased to 145 and the QRS widened to a   left bundle Malaiyah Achorn block. The increased rate persisted for several   minutes but then began to slow. When the heart rate was slower, P   waves could be seen. There were no flutter waves. The raw data   reveals no evidence of excess motion. Tomographic images with stress   reveal a small area of moderate decreased uptake in the septum. Rest   images reveal a moderate area of decreased uptake in the septum.   These findings are most consistent  with left bundle Teodoro Jeffreys block.   Wall motion analysis reveals excellent motion with an ejection  fraction of 70%.   IMPRESSION:   This is a low risk scan. There is no scar or ischemia. There is   decreased activity in the septum, consistent with left bundle Aidenjames Heckmann   block. It is noted that the QRS was narrow at the start of the   study. However with Lexiscan, the patient developed tachycardia with   left bundle Laiklyn Pilkenton block. Assessment shows that this was most   probably sinus tachycardia which slowed over time.   10/2018 echo IMPRESSIONS      1. The left ventricle has moderately reduced systolic function, with an ejection fraction of 35-40%. The cavity size was normal. Severe focal basal septal hypertrophy.. Diastolic dysfunction, grade indeterminate. Left ventricular diffuse hypokinesis.  2. The right ventricle has normal systolic function. The cavity was normal. There is no increase in right ventricular wall thickness.  3. The aortic valve is tricuspid. Mild thickening of the aortic valve. Mild aortic annular calcification noted.  4. The mitral valve is grossly normal. Mild thickening of the mitral valve leaflet. Mitral valve regurgitation is mild to moderate by color flow Doppler.  5. The tricuspid valve is grossly normal.  6. The aorta is normal unless otherwise noted.   12/2018 30 day event monitor 30 day event monitor Min HR 57, Max HR 130, Avg HR 79 No symptoms reported Telemetry tracings show sinus rhythm with occasional PVCs. No significant arrhythmias   Jan 2021 echo IMPRESSIONS     1. Left ventricular ejection fraction, by visual estimation, is 35 to  40%. The left ventricle has moderately decreased function. There is no  left ventricular hypertrophy.   2. Left ventricular diastolic parameters are indeterminate.   3. The left ventricle demonstrates global hypokinesis.   4. Global right ventricle has normal systolic function.The right  ventricular size is normal. No  increase in right ventricular wall  thickness.   5. Left atrial size was normal.   6. Right atrial size was normal.   7. The mitral valve is normal in structure. Mild to moderate mitral valve  regurgitation. No evidence of mitral stenosis.   8. The tricuspid valve is normal in structure.   9. The aortic valve has an indeterminant number of cusps. Aortic valve  regurgitation is not visualized. No evidence of aortic valve sclerosis or  stenosis.  10. The pulmonic valve was not well visualized. Pulmonic valve  regurgitation is not visualized.  11. The inferior vena cava is normal in size with greater than 50%  respiratory variability, suggesting right atrial pressure of 3 mmHg.       04/2019 RHC/LHC Normal right heart pressures.  Cardiac output 3.7 L/min.  Cardiac index 2.5 L/min/m Normal left main Transapical LAD.  First diagonal contains segmental proximal 40% narrowing. Normal small ramus. Circumflex is normal. The right coronary demonstrates generalized minimal atherosclerosis.  No focal stenosis. Anterior and apical mild to moderate hypokinesis.  Estimated ejection fraction 35 to 40%.   RECOMMENDATIONS:   Guideline directed therapy for left ventricular systolic dysfunction in the setting of left bundle Cleona Doubleday block.  If no improvement in LV function with therapy, consider resync.       Assessment and Plan  Chronic systolic HF -recent limitations on medication regimen due to soft bp's and renal dysfunction - currently just on toprol. Recent retrial of aldactone causes drop in Na and elevation in Cr. - repeat echo and labs. Pending results may consider low dose ARB, I dont think bp's would tolerate entresto at this time.  Pending echo results consider SGLT2i.    F/u 6 months  Arnoldo Lenis, M.D.

## 2021-04-22 NOTE — Patient Instructions (Signed)
Labwork: ?BMET ?MAG ? ?Testing/Procedures: ?Your physician has requested that you have an echocardiogram. Echocardiography is a painless test that uses sound waves to create images of your heart. It provides your doctor with information about the size and shape of your heart and how well your heart?s chambers and valves are working. This procedure takes approximately one hour. There are no restrictions for this procedure. ? ? ?Follow-Up: ?Follow up with Dr. Harl Bowie in 6 months. ? ?Any Other Special Instructions Will Be Listed Below (If Applicable). ? ? ? ? ?If you need a refill on your cardiac medications before your next appointment, please call your pharmacy. ? ?

## 2021-04-25 ENCOUNTER — Telehealth: Payer: Self-pay

## 2021-04-25 NOTE — Telephone Encounter (Signed)
Patient notified and verbalized understanding. Pt had no questions or concerns at this time 

## 2021-04-25 NOTE — Telephone Encounter (Signed)
-----   Message from Arnoldo Lenis, MD sent at 04/24/2021 11:45 AM EDT ----- ?Mild decrease in kidney function since last lab check but not to the level she had last year. Would continue current meds at this time, contineu to work to stay well hydrated. ? ? ?Zandra Abts MD ?

## 2021-05-02 ENCOUNTER — Ambulatory Visit: Payer: Medicare HMO | Admitting: Neurology

## 2021-05-02 NOTE — Progress Notes (Signed)
? ? ?PATIENT: Melanie Bradley ?DOB: 04-21-1950 ? ?REASON FOR VISIT: Follow up for Alzheimer's disease ?HISTORY FROM: Patient, husband ?PRIMARY NEUROLOGIST: Dr. Jannifer Franklin, Dr. Brett Fairy  ? ?ASSESSMENT AND PLAN ?71 y.o. year old female  ? ?Alzheimer's disease  ? ?-Melanie Bradley continues to show a progressive decline in her memory; since the last evaluation, her husband is having to help with more of the household chores, she does not drive ?-Her MMSE cannot be completed, her last score was 8/30 in June 2022 ?-Will continue Aricept and Namenda, refills were sent in  ?-We discussed planning for the future as the memory continues to decline  ?-Continue to follow-up with PCP who can provide refills going forward, will return to our office on an as-needed basis ? ?HISTORY OF PRESENT ILLNESS: ?Today 05/03/21 ?Melanie Bradley here today for follow-up.  On Aricept and Namenda. Husband is having to help with housework. Enjoys planting garden. She helps mow. She doesn't a car, but she says she does. Good appetite. Recently had cataract surgery. Sometimes gets scared laying back or with turning when her husband is driving. No falls. Husband manages her pills. No diarrhea lately. Has done well since off Mestinon. Has gained 15 lbs since June 2022, on Megace. No safety issues. Isn't left alone. Has a pleasant mood.  ? ?HISTORY  ?08/02/2020 Dr. Jannifer Franklin: Melanie Bradley is a 71 year old right-handed white female with a history of a progressive memory disturbance consistent with Alzheimer's disease.  The patient comes in with her husband today who indicates that she has not had any significant alteration in her functional level since last seen.  She continues to be quite active, she will cut the grass, she will do housework, and gardening.  The patient sleeps well at night.  She remains on Aricept and Namenda.  She was in the hospital in November and then again in December 2021 with dehydration and worsening renal failure.  The patient does  apparently drive a half a mile from the house to go to a cemetery, but otherwise she does not do any other driving.  There have been no safety issues noted by the husband.  She returns for further evaluation. ? ?REVIEW OF SYSTEMS: Out of a complete 14 system review of symptoms, the patient complains only of the following symptoms, and all other reviewed systems are negative. ? ?See HPI ? ?ALLERGIES: ?Allergies  ?Allergen Reactions  ? Penicillins Hives and Swelling  ?  Has patient had a PCN reaction causing IMMEDIATE RASH, FACIAL/TONGUE/THROAT SWELLING, SOB, OR LIGHTHEADEDNESS WITH HYPOTENSION:  #  #  #  YES  #  #  #  ?Has patient had a PCN reaction causing severe rash involving mucus membranes or skin necrosis: No ?Has patient had a PCN reaction that required hospitalization No ?Has patient had a PCN reaction occurring within the last 10 years: No ?If all of the above answers are "NO", then may proceed with Cephalosporin use. ?  ? Benadryl [Diphenhydramine Hcl] Hives  ? Msm [Methylsulfonylmethane] Hives  ? Nubain [Nalbuphine Hcl] Hives  ? Sulfa Antibiotics Hives  ? Codeine   ?  UNSPECIFIED REACTION [IF AT ALL] ?REFUSES TO TAKE  ? ? ?HOME MEDICATIONS: ?Outpatient Medications Prior to Visit  ?Medication Sig Dispense Refill  ? acetaminophen (TYLENOL) 500 MG tablet Take 1,000 mg by mouth every 6 (six) hours as needed (headaches.).    ? alendronate (FOSAMAX) 70 MG tablet Take 70 mg by mouth every Tuesday.     ? aspirin EC 81 MG tablet  Take 81 mg by mouth daily.    ? Calcium Carb-Cholecalciferol (CALCIUM 600+D3 PO) Take 1 tablet by mouth daily with supper.    ? Cyanocobalamin (B-12 PO) Take 6,000 mcg by mouth daily.    ? donepezil (ARICEPT) 10 MG tablet Take 1 tablet (10 mg total) by mouth at bedtime. 90 tablet 3  ? escitalopram (LEXAPRO) 10 MG tablet Take 10 mg by mouth daily with supper.   0  ? megestrol (MEGACE) 20 MG tablet Take 20 mg by mouth 2 (two) times daily.    ? memantine (NAMENDA) 10 MG tablet Take 1 tablet  (10 mg total) by mouth 2 (two) times daily. 180 tablet 3  ? metoprolol succinate (TOPROL XL) 25 MG 24 hr tablet Take 1 tablet (25 mg total) by mouth daily. 90 tablet 3  ? SYNTHROID 88 MCG tablet Take 88 mcg by mouth See admin instructions. Take 1 tablet (88 mcg) by mouth on Sundays, Mondays, Tuesdays, Wednesdays,Thursdays, & Saturdays at bedtime, DOES NOT TAKE ANY Fridays.    ? ?Facility-Administered Medications Prior to Visit  ?Medication Dose Route Frequency Provider Last Rate Last Admin  ? sodium chloride flush (NS) 0.9 % injection 3 mL  3 mL Intravenous Q12H Branch, Alphonse Guild, MD      ? ? ?PAST MEDICAL HISTORY: ?Past Medical History:  ?Diagnosis Date  ? Atrial fibrillation ([place])   ? Bloating   ? Complication of anesthesia   ?  " hard to wake up sometimes "  ? Dementia ([place])   ? Diverticula of colon   ? Epigastric burning sensation   ? GERD (gastroesophageal reflux disease)   ? Headache disorder 06/03/2014  ? Headache(784.0)   ? Helicobacter pylori gastritis 2004  ? Hemorrhoids 06/26/03  ? Dr. Laural Golden tcs  ? Hiatal hernia 06/26/03  ? Dr. Laural Golden egd  ? Hypertension   ? Hypothyroidism   ? Left bundle branch block   ? rate dependent LBBB 04/2013  ? Memory difficulty 06/03/2014  ? Shoulder fracture, left   ? Spastic colon   ? Wrist fracture   ? ? ?PAST SURGICAL HISTORY: ?Past Surgical History:  ?Procedure Laterality Date  ? APPENDECTOMY    ? CATARACT EXTRACTION W/PHACO Right 01/14/2021  ? Procedure: CATARACT EXTRACTION PHACO AND INTRAOCULAR LENS PLACEMENT ([facility]);  Surgeon: Baruch Goldmann, MD;  [facility];  Service: Ophthalmology;  Laterality: Right;  CDE 8.87  ? CATARACT EXTRACTION W/PHACO Left 04/15/2021  ? Procedure: CATARACT EXTRACTION PHACO AND INTRAOCULAR LENS PLACEMENT ([facility]);  Surgeon: Baruch Goldmann, MD;  [facility];  Service: Ophthalmology;  Laterality: Left;  CDE: 8.19  ? CHOLECYSTECTOMY    ? COLONOSCOPY  06/26/03  ? Dr. Kem Boroughs diverticula at the sigmoid colon with one of the transverse colon,  normal terminal ileoscopy, small external hemorrhoids the  ? COLONOSCOPY N/A 01/20/2016  ? sigmoid diverticulosis, otherwise normal.   ? ESOPHAGOGASTRODUODENOSCOPY  03/15/2011  ? Dr. Trevor Iha hiatal hernia, abnormal gastric mucosa of unclear significance. Gastric biopsies negative, no H. pylori.Procedure: ESOPHAGOGASTRODUODENOSCOPY (EGD);  Surgeon: Daneil Dolin, MD;  [facility];  Service: Endoscopy;  Laterality: N/A;  7:30  ? ORIF HUMERUS FRACTURE Left 05/29/2016  ? Procedure: LEFT OPEN REDUCTION INTERNAL FIXATION (ORIF) PROXIMAL HUMERUS FRACTURE;  Surgeon: Meredith Pel, MD;  Location: [place];  Service: Orthopedics;  Laterality: Left;  ? ORIF WRIST FRACTURE Left 05/29/2016  ? Procedure: OPEN REDUCTION INTERNAL FIXATION (ORIF) LEFT WRIST FRACTURE;  Surgeon: Meredith Pel, MD;  Location: [place];  Service:  Orthopedics;  Laterality: Left;  ? RIGHT/LEFT HEART CATH AND CORONARY ANGIOGRAPHY N/A 04/17/2019  ? Procedure: RIGHT/LEFT HEART CATH AND CORONARY ANGIOGRAPHY;  Surgeon: Belva Crome, MD;  Location: [place] [facility];  Service: Cardiovascular;  Laterality: N/A;  ? TUBAL LIGATION    ? UPPER GASTROINTESTINAL ENDOSCOPY  06/26/03  ? Dr. Wynelle Bourgeois sliding hiatal hernia with 8 mm tongue of gastric type mucosa at distal esophagus bile in the stomach.  ? ? ?FAMILY HISTORY: ?Family History  ?Problem Relation Age of Onset  ? Hypertension Mother   ? Angina Mother   ? COPD Father   ? Heart failure Father   ? Cancer Sister   ? Dementia Sister   ? Cardiomyopathy Brother   ? Pneumonia Other   ? Migraines Sister   ? Colon cancer Neg Hx   ? ? ?SOCIAL HISTORY: ?Social History  ? ?Socioeconomic History  ? Marital status: Married  ?  Spouse name: [name]  ? Number of children: Not on file  ? Years of education: 67  ? Highest education level: Not on file  ?Occupational History  ? Not on file  ?Tobacco Use  ? Smoking status: Every Day  ?  Packs/day: 1.00  ?  Years: 45.00  ?  Pack years: 45.00  ?  Types:  Cigarettes  ?  Start date: 09/10/1966  ? Smokeless tobacco: Never  ? Tobacco comments:  ?  12/21/16 1 PPD  ?Vaping Use  ? Vaping Use: Never used  ?Substance and Sexual Activity  ? Alcohol use: No  ?  Alcohol/week: 0.0 stand

## 2021-05-03 ENCOUNTER — Ambulatory Visit: Payer: Medicare HMO | Admitting: Neurology

## 2021-05-03 ENCOUNTER — Encounter: Payer: Self-pay | Admitting: Neurology

## 2021-05-03 VITALS — BP 158/106 | HR 79 | Ht 61.0 in | Wt 137.0 lb

## 2021-05-03 DIAGNOSIS — G300 Alzheimer's disease with early onset: Secondary | ICD-10-CM

## 2021-05-03 DIAGNOSIS — F0390 Unspecified dementia without behavioral disturbance: Secondary | ICD-10-CM | POA: Diagnosis not present

## 2021-05-03 MED ORDER — MEMANTINE HCL 10 MG PO TABS: 10.0000 mg | ORAL_TABLET | Freq: Two times a day (BID) | ORAL | 3 refills | Status: AC

## 2021-05-03 MED ORDER — DONEPEZIL HCL 10 MG PO TABS: 10.0000 mg | ORAL_TABLET | Freq: Every day | ORAL | 3 refills | Status: AC

## 2021-05-03 NOTE — Patient Instructions (Signed)
Great to see you today ?Discuss with primary care weight gain ?Continue Aricept and Namenda ?See back here as needed  ?

## 2021-05-04 ENCOUNTER — Telehealth: Payer: Self-pay | Admitting: Cardiology

## 2021-05-04 NOTE — Telephone Encounter (Signed)
Seen 04/22/21 by Dr.Branch. I called patient to triage, get weight, review meds etc. Left message to return call. ?

## 2021-05-04 NOTE — Telephone Encounter (Addendum)
I spoke with husband. He states he has notices that the last few days Melanie Bradley's leg appear more swollen. Her weight today is 138 lbs ( was 137 lbs at 04/22/21 visit) ? ? ?Husband states she is not wearing compression knee hi's like she used to and she is not elevating legs while sitting.She is also not drinking water.Husband says she drinks 1 cup coffee in the am and 1-2 Mt. Dews per day, no water at all. He has used liquid IV added to water in the past and will try that again. ? ? ? ?I will forward to Dr.Branch for review. ? ? ? ? ? ? ?

## 2021-05-04 NOTE — Telephone Encounter (Signed)
Husband of patient returning call for [facility] ?

## 2021-05-04 NOTE — Telephone Encounter (Signed)
Patient husband walked in and, is concerned about leg swelling doesn't want her to end up in the hospital. Would like Branch's nurse to give him a call.  ?

## 2021-05-06 ENCOUNTER — Ambulatory Visit (HOSPITAL_COMMUNITY)
Admission: RE | Admit: 2021-05-06 | Discharge: 2021-05-06 | Disposition: A | Discharge status: Home / Self Care | Payer: Medicare HMO | Source: Ambulatory Visit | Attending: Cardiology | Admitting: Cardiology

## 2021-05-06 DIAGNOSIS — I5022 Chronic systolic (congestive) heart failure: Secondary | ICD-10-CM | POA: Diagnosis present

## 2021-05-06 LAB — ECHOCARDIOGRAM COMPLETE
Area-P 1/2: 5.84 cm2
S' Lateral: 4.1 cm

## 2021-05-06 NOTE — Telephone Encounter (Signed)
Patient's spouse returned call.  

## 2021-05-06 NOTE — Telephone Encounter (Signed)
Husband & patient came by office - notified both of them - verbalized understanding.   ? ?Husband states that he has bought her some compression socks & keeping her legs elevated at home.  Also working on her hydration.  ?

## 2021-05-06 NOTE — Telephone Encounter (Signed)
If some swelling in legs but no other symptoms such as shortness of breath would not make any changes. She is prone to dehydratino and would be cautious with considerations for any fluid pills to try to improve her edema. If worsening SOB would contact us back, would continue to encourage hydration for her ? ? ?Zandra Abts MD ?

## 2021-05-06 NOTE — Progress Notes (Signed)
*  PRELIMINARY RESULTS* ?Echocardiogram ?2D Echocardiogram has been performed. ? ?Melanie Bradley ?05/06/2021, 4:00 PM ?

## 2021-05-06 NOTE — Telephone Encounter (Signed)
Left message to return call 

## 2021-05-23 ENCOUNTER — Telehealth: Payer: Self-pay

## 2021-05-23 NOTE — Telephone Encounter (Signed)
-----   Message from Arnoldo Lenis, MD sent at 05/16/2021 10:07 AM EDT ----- ?Echo shows heart pumping function remains 30-35% (normal is 50-60%). Essentaily similar to where it has been. With her kidney dysfunction and prior issues with dehydration would continue avoid some of the additional heart failiure medications at this time ? ? ?Zandra Abts MD ?

## 2021-05-23 NOTE — Telephone Encounter (Signed)
Patient notified and verbalized understanding. Patient had no questions or concerns at this time.  

## 2021-05-31 NOTE — Progress Notes (Signed)
Indication for magnesium was chronic systolic heart failure ? ? ?Zandra Abts MD ?

## 2021-09-20 ENCOUNTER — Other Ambulatory Visit (HOSPITAL_COMMUNITY): Payer: Self-pay | Admitting: Endocrinology

## 2021-09-20 DIAGNOSIS — M859 Disorder of bone density and structure, unspecified: Secondary | ICD-10-CM

## 2021-10-03 ENCOUNTER — Ambulatory Visit (HOSPITAL_COMMUNITY)
Admission: RE | Admit: 2021-10-03 | Discharge: 2021-10-03 | Disposition: A | Discharge status: Home / Self Care | Payer: Medicare HMO | Source: Ambulatory Visit | Attending: Endocrinology | Admitting: Endocrinology

## 2021-10-03 DIAGNOSIS — M859 Disorder of bone density and structure, unspecified: Secondary | ICD-10-CM | POA: Insufficient documentation

## 2021-10-03 DIAGNOSIS — Z13820 Encounter for screening for osteoporosis: Secondary | ICD-10-CM | POA: Insufficient documentation

## 2021-10-03 DIAGNOSIS — Z780 Asymptomatic menopausal state: Secondary | ICD-10-CM | POA: Diagnosis not present

## 2021-10-03 DIAGNOSIS — M810 Age-related osteoporosis without current pathological fracture: Secondary | ICD-10-CM | POA: Insufficient documentation

## 2021-10-03 DIAGNOSIS — F0390 Unspecified dementia without behavioral disturbance: Secondary | ICD-10-CM | POA: Insufficient documentation

## 2021-10-31 ENCOUNTER — Encounter: Payer: Self-pay | Admitting: *Deleted

## 2021-11-02 ENCOUNTER — Encounter: Payer: Self-pay | Admitting: Cardiology

## 2021-11-02 ENCOUNTER — Ambulatory Visit: Payer: Medicare HMO | Attending: Cardiology | Admitting: Cardiology

## 2021-11-02 VITALS — BP 130/62 | HR 67 | Ht 61.0 in | Wt 137.8 lb

## 2021-11-02 DIAGNOSIS — I5022 Chronic systolic (congestive) heart failure: Secondary | ICD-10-CM

## 2021-11-02 NOTE — Patient Instructions (Signed)
Medication Instructions:  Your physician recommends that you continue on your current medications as directed. Please refer to the Current Medication list given to you today.   Labwork: None  Testing/Procedures: None  Follow-Up: Follow up with Dr. Branch in 6 months.   Any Other Special Instructions Will Be Listed Below (If Applicable).     If you need a refill on your cardiac medications before your next appointment, please call your pharmacy.  

## 2021-11-02 NOTE — Progress Notes (Signed)
Clinical Summary Melanie Bradley is a 71 y.o.female seen today for follow up of the following medical problems.      1. Chronic systolic HF - new diagnosis during 10/2018 admission with syncope. Echo showed LVEF 35-40% Jan 2021 echo LVEF 35-40%, mild to mod MR.  04/2019 no significant CAD, normal filling pressures. CI 3.7 Mean PA 15, PCWP 7 07/2019 CMRI: LVEF 38%, normal RV. No infiltrative disease   - medical therapy has historically been limited by renal dysfunction, low bp's, and recurrent issues with dehydration.     - we restarted aldactone, caused decrease in sodium and elevation in Cr. We discontinued   04/2021 echo LVEF 30-35% - no SOB, DOE, mild LE edema that is stable with compression stockings.     2. Chronic LBBB - no significant CAD by cath 04/2019       3.  Memory deficit/Dementia - on aricept, followed by neuro -remains active, independent. Enjoys doing yard and housework.       4. CKD -  lats Cr 1.83 04/2021     Past Medical History:  Diagnosis Date   Atrial fibrillation (HCC)    Bloating    Complication of anesthesia     " hard to wake up sometimes "   Dementia ([place])    Diverticula of colon    Epigastric burning sensation    GERD (gastroesophageal reflux disease)    Headache disorder 06/03/2014   OVZCHYIF(027.7)    Helicobacter pylori gastritis 2004   Hemorrhoids 06/26/03   Dr. Laural Golden tcs   Hiatal hernia 06/26/03   Dr. Laural Golden egd   Hypertension    Hypothyroidism    Left bundle Shanie Mauzy block    rate dependent LBBB 04/2013   Memory difficulty 06/03/2014   Shoulder fracture, left    Spastic colon    Wrist fracture      Allergies  Allergen Reactions   Penicillins Hives and Swelling    Has patient had a PCN reaction causing IMMEDIATE RASH, FACIAL/TONGUE/THROAT SWELLING, SOB, OR LIGHTHEADEDNESS WITH HYPOTENSION:  #  #  #  YES  #  #  #  Has patient had a PCN reaction causing severe rash involving mucus membranes or skin necrosis: No Has patient  had a PCN reaction that required hospitalization No Has patient had a PCN reaction occurring within the last 10 years: No If all of the above answers are "NO", then may proceed with Cephalosporin use.    Benadryl [Diphenhydramine Hcl] Hives   Msm [Methylsulfonylmethane] Hives   Nubain [Nalbuphine Hcl] Hives   Sulfa Antibiotics Hives   Codeine     UNSPECIFIED REACTION [IF AT ALL] REFUSES TO TAKE     Current Outpatient Medications  Medication Sig Dispense Refill   acetaminophen (TYLENOL) 500 MG tablet Take 1,000 mg by mouth every 6 (six) hours as needed (headaches.).     alendronate (FOSAMAX) 70 MG tablet Take 70 mg by mouth every Tuesday.      aspirin EC 81 MG tablet Take 81 mg by mouth daily.     Calcium Carb-Cholecalciferol (CALCIUM 600+D3 PO) Take 1 tablet by mouth daily with supper.     Cyanocobalamin (B-12 PO) Take 6,000 mcg by mouth daily.     donepezil (ARICEPT) 10 MG tablet Take 1 tablet (10 mg total) by mouth at bedtime. 90 tablet 3   escitalopram (LEXAPRO) 10 MG tablet Take 10 mg by mouth daily with supper.   0   megestrol (MEGACE) 20 MG tablet  Take 20 mg by mouth 2 (two) times daily.     memantine (NAMENDA) 10 MG tablet Take 1 tablet (10 mg total) by mouth 2 (two) times daily. 180 tablet 3   metoprolol succinate (TOPROL XL) 25 MG 24 hr tablet Take 1 tablet (25 mg total) by mouth daily. 90 tablet 3   SYNTHROID 88 MCG tablet Take 88 mcg by mouth See admin instructions. Take 1 tablet (88 mcg) by mouth on Sundays, Mondays, Tuesdays, Wednesdays,Thursdays, & Saturdays at bedtime, DOES NOT TAKE ANY Fridays.     Current Facility-Administered Medications  Medication Dose Route Frequency Provider Last Rate Last Admin   sodium chloride flush (NS) 0.9 % injection 3 mL  3 mL Intravenous Q12H Arnoldo Lenis, MD         Past Surgical History:  Procedure Laterality Date   APPENDECTOMY     CATARACT EXTRACTION W/PHACO Right 01/14/2021   Procedure: CATARACT EXTRACTION PHACO AND  INTRAOCULAR LENS PLACEMENT ([place]);  Surgeon: Baruch Goldmann, MD;  Location: [facility];  Service: Ophthalmology;  Laterality: Right;  CDE 8.87   CATARACT EXTRACTION W/PHACO Left 04/15/2021   Procedure: CATARACT EXTRACTION PHACO AND INTRAOCULAR LENS PLACEMENT ([facility]);  Surgeon: Baruch Goldmann, MD;  Location: [facility];  Service: Ophthalmology;  Laterality: Left;  CDE: 8.19   CHOLECYSTECTOMY     COLONOSCOPY  06/26/03   Dr. Kem Boroughs diverticula at the sigmoid colon with one of the transverse colon, normal terminal ileoscopy, small external hemorrhoids the   COLONOSCOPY N/A 01/20/2016   sigmoid diverticulosis, otherwise normal.    ESOPHAGOGASTRODUODENOSCOPY  03/15/2011   Dr. Trevor Iha hiatal hernia, abnormal gastric mucosa of unclear significance. Gastric biopsies negative, no H. pylori.Procedure: ESOPHAGOGASTRODUODENOSCOPY (EGD);  Surgeon: Daneil Dolin, MD;  Location: [facility];  Service: Endoscopy;  Laterality: N/A;  7:30   ORIF HUMERUS FRACTURE Left 05/29/2016   Procedure: LEFT OPEN REDUCTION INTERNAL FIXATION (ORIF) PROXIMAL HUMERUS FRACTURE;  Surgeon: Meredith Pel, MD;  Location: [facility];  Service: Orthopedics;  Laterality: Left;   ORIF WRIST FRACTURE Left 05/29/2016   Procedure: OPEN REDUCTION INTERNAL FIXATION (ORIF) LEFT WRIST FRACTURE;  Surgeon: Meredith Pel, MD;  Location: [facility];  Service: Orthopedics;  Laterality: Left;   RIGHT/LEFT HEART CATH AND CORONARY ANGIOGRAPHY N/A 04/17/2019   Procedure: RIGHT/LEFT HEART CATH AND CORONARY ANGIOGRAPHY;  Surgeon: Belva Crome, MD;  Location: [facility];  Service: Cardiovascular;  Laterality: N/A;   TUBAL LIGATION     UPPER GASTROINTESTINAL ENDOSCOPY  06/26/03   Dr. Wynelle Bourgeois sliding hiatal hernia with 8 mm tongue of gastric type mucosa at distal esophagus bile in the stomach.     Allergies  Allergen Reactions   Penicillins Hives and Swelling    Has patient had a PCN reaction causing IMMEDIATE RASH, FACIAL/TONGUE/THROAT  SWELLING, SOB, OR LIGHTHEADEDNESS WITH HYPOTENSION:  #  #  #  YES  #  #  #  Has patient had a PCN reaction causing severe rash involving mucus membranes or skin necrosis: No Has patient had a PCN reaction that required hospitalization No Has patient had a PCN reaction occurring within the last 10 years: No If all of the above answers are "NO", then may proceed with Cephalosporin use.    Benadryl [Diphenhydramine Hcl] Hives   Msm [Methylsulfonylmethane] Hives   Nubain [Nalbuphine Hcl] Hives   Sulfa Antibiotics Hives   Codeine     UNSPECIFIED REACTION [IF AT ALL] REFUSES TO TAKE      Family History  Problem Relation  Age of Onset   Hypertension Mother    Angina Mother    COPD Father    Heart failure Father    Cancer Sister    Dementia Sister    Cardiomyopathy Brother    Pneumonia Other    Migraines Sister    Colon cancer Neg Hx      Social History Melanie Bradley reports that she has been smoking cigarettes. She started smoking about 55 years ago. She has a 45.00 pack-year smoking history. She has never used smokeless tobacco. Melanie Bradley reports no history of alcohol use.   Review of Systems CONSTITUTIONAL: No weight loss, fever, chills, weakness or fatigue.  HEENT: Eyes: No visual loss, blurred vision, double vision or yellow sclerae.No hearing loss, sneezing, congestion, runny nose or sore throat.  SKIN: No rash or itching.  CARDIOVASCULAR: per hpi RESPIRATORY: No shortness of breath, cough or sputum.  GASTROINTESTINAL: No anorexia, nausea, vomiting or diarrhea. No abdominal pain or blood.  GENITOURINARY: No burning on urination, no polyuria NEUROLOGICAL: No headache, dizziness, syncope, paralysis, ataxia, numbness or tingling in the extremities. No change in bowel or bladder control.  MUSCULOSKELETAL: No muscle, back pain, joint pain or stiffness.  LYMPHATICS: No enlarged nodes. No history of splenectomy.  PSYCHIATRIC: No history of depression or anxiety.   ENDOCRINOLOGIC: No reports of sweating, cold or heat intolerance. No polyuria or polydipsia.  Marland Kitchen   Physical Examination Today's Vitals   11/02/21 1017  BP: 130/62  Pulse: 67  SpO2: 100%  Weight: 137 lb 12.8 oz (62.5 kg)  Height: '5\' 1"'$  (1.549 m)   Body mass index is 26.04 kg/m.  Gen: resting comfortably, no acute distress HEENT: no scleral icterus, pupils equal round and reactive, no palptable cervical adenopathy,  CV: RRR, no m/r/ gno jvd Resp: Clear to auscultation bilaterally GI: abdomen is soft, non-tender, non-distended, normal bowel sounds, no hepatosplenomegaly MSK: extremities are warm, no edema.  Skin: warm, no rash Neuro:  no focal deficits Psych: appropriate affect   Diagnostic Studies  04/2013 Lexiscan MPI FINDINGS:   The patient's initial EKG revealed a narrow QRS. After she received   Lexiscan, the heart rate increased to 145 and the QRS widened to a   left bundle Madisson Kulaga block. The increased rate persisted for several   minutes but then began to slow. When the heart rate was slower, P   waves could be seen. There were no flutter waves. The raw data   reveals no evidence of excess motion. Tomographic images with stress   reveal a small area of moderate decreased uptake in the septum. Rest   images reveal a moderate area of decreased uptake in the septum.   These findings are most consistent with left bundle Paullette Mckain block.   Wall motion analysis reveals excellent motion with an ejection   fraction of 70%.   IMPRESSION:   This is a low risk scan. There is no scar or ischemia. There is   decreased activity in the septum, consistent with left bundle Peirce Deveney   block. It is noted that the QRS was narrow at the start of the   study. However with Lexiscan, the patient developed tachycardia with   left bundle Dion Sibal block. Assessment shows that this was most   probably sinus tachycardia which slowed over time.   10/2018 echo IMPRESSIONS      1. The left ventricle  has moderately reduced systolic function, with an ejection fraction of 35-40%. The cavity size was normal. Severe focal basal septal  hypertrophy.. Diastolic dysfunction, grade indeterminate. Left ventricular diffuse hypokinesis.  2. The right ventricle has normal systolic function. The cavity was normal. There is no increase in right ventricular wall thickness.  3. The aortic valve is tricuspid. Mild thickening of the aortic valve. Mild aortic annular calcification noted.  4. The mitral valve is grossly normal. Mild thickening of the mitral valve leaflet. Mitral valve regurgitation is mild to moderate by color flow Doppler.  5. The tricuspid valve is grossly normal.  6. The aorta is normal unless otherwise noted.   12/2018 30 day event monitor 30 day event monitor Min HR 57, Max HR 130, Avg HR 79 No symptoms reported Telemetry tracings show sinus rhythm with occasional PVCs. No significant arrhythmias   Jan 2021 echo IMPRESSIONS     1. Left ventricular ejection fraction, by visual estimation, is 35 to  40%. The left ventricle has moderately decreased function. There is no  left ventricular hypertrophy.   2. Left ventricular diastolic parameters are indeterminate.   3. The left ventricle demonstrates global hypokinesis.   4. Global right ventricle has normal systolic function.The right  ventricular size is normal. No increase in right ventricular wall  thickness.   5. Left atrial size was normal.   6. Right atrial size was normal.   7. The mitral valve is normal in structure. Mild to moderate mitral valve  regurgitation. No evidence of mitral stenosis.   8. The tricuspid valve is normal in structure.   9. The aortic valve has an indeterminant number of cusps. Aortic valve  regurgitation is not visualized. No evidence of aortic valve sclerosis or  stenosis.  10. The pulmonic valve was not well visualized. Pulmonic valve  regurgitation is not visualized.  11. The inferior vena cava  is normal in size with greater than 50%  respiratory variability, suggesting right atrial pressure of 3 mmHg.       04/2019 RHC/LHC Normal right heart pressures.  Cardiac output 3.7 L/min.  Cardiac index 2.5 L/min/m Normal left main Transapical LAD.  First diagonal contains segmental proximal 40% narrowing. Normal small ramus. Circumflex is normal. The right coronary demonstrates generalized minimal atherosclerosis.  No focal stenosis. Anterior and apical mild to moderate hypokinesis.  Estimated ejection fraction 35 to 40%.   RECOMMENDATIONS:   Guideline directed therapy for left ventricular systolic dysfunction in the setting of left bundle Rue Tinnel block.  If no improvement in LV function with therapy, consider resync.    04/3021 echo    1. Left ventricular ejection fraction, by estimation, is 30 to 35%. The  left ventricle has moderately decreased function. The left ventricle  demonstrates global hypokinesis. There is mild left ventricular  hypertrophy. Left ventricular diastolic  parameters are indeterminate.   2. Right ventricular systolic function is normal. The right ventricular  size is normal. Tricuspid regurgitation signal is inadequate for assessing  PA pressure.   3. The mitral valve is normal in structure. Trivial mitral valve  regurgitation.   4. The aortic valve is tricuspid. Aortic valve regurgitation is not  visualized. No aortic stenosis is present.    Assessment and Plan   Chronic systolic HF -recent limitations on medication regimen due to soft bp's and renal dysfunction, recurrent issues with hypovolemia.  - currently just on toprol. Recent retrial of aldactone causes drop in Na and elevation in Cr. - GFR 30 on last check, avoid ACE/ARB/ARNI. GFR is borderline for SGLT2i, with recurrent issues with dehydration also would avoid at this time - continue  current meds, no recent symptoms.       Arnoldo Lenis, M.D.,

## 2021-11-18 ENCOUNTER — Other Ambulatory Visit (HOSPITAL_COMMUNITY): Payer: Self-pay | Admitting: Family Medicine

## 2021-11-18 DIAGNOSIS — Z1231 Encounter for screening mammogram for malignant neoplasm of breast: Secondary | ICD-10-CM

## 2021-12-12 ENCOUNTER — Encounter (HOSPITAL_COMMUNITY): Payer: Self-pay

## 2021-12-12 ENCOUNTER — Ambulatory Visit (HOSPITAL_COMMUNITY)
Admission: RE | Admit: 2021-12-12 | Discharge: 2021-12-12 | Disposition: A | Discharge status: Home / Self Care | Payer: Medicare HMO | Source: Ambulatory Visit | Attending: Family Medicine | Admitting: Family Medicine

## 2021-12-12 DIAGNOSIS — Z1231 Encounter for screening mammogram for malignant neoplasm of breast: Secondary | ICD-10-CM | POA: Diagnosis not present

## 2021-12-20 ENCOUNTER — Ambulatory Visit: Payer: Medicare HMO | Admitting: Internal Medicine

## 2021-12-20 ENCOUNTER — Encounter: Payer: Self-pay | Admitting: Internal Medicine

## 2021-12-20 VITALS — BP 145/96 | HR 63 | Temp 98.4°F | Ht 62.0 in | Wt 132.4 lb

## 2021-12-20 DIAGNOSIS — Z87898 Personal history of other specified conditions: Secondary | ICD-10-CM

## 2021-12-20 NOTE — Patient Instructions (Signed)
It was good to see you again today!  You are doing well from a GI standpoint  No scheduled follow-up here at this time  Would consider 1 more screening colonoscopy in 2027 depending on your overall health  If you have any interim problems, please do not hesitate to call

## 2021-12-20 NOTE — Progress Notes (Signed)
Primary Care Physician:  Sharilyn Sites, MD Primary Gastroenterologist:  Dr. Gala Romney  Pre-Procedure History & Physical: HPI:  Melanie Bradley is a 71 y.o. female here for follow-up.  Self limiting bout of diarrhea last year.  Stool studies negative.  Patient accompanied by her husband today.  They both endorse normal bowel function.  No bleeding.  No nausea or vomiting.  Denies dysphagia or GERD.  On no acid suppressing agents. Be due for 1 more screening colonoscopy 2027 if overall health permits. Past Medical History:  Diagnosis Date   Atrial fibrillation (HCC)    Bloating    Complication of anesthesia     " hard to wake up sometimes "   Dementia ([place])    Diverticula of colon    Epigastric burning sensation    GERD (gastroesophageal reflux disease)    Headache disorder 06/03/2014   [id number](570.1)    Helicobacter pylori gastritis 2004   Hemorrhoids 06/26/03   Dr. Laural Golden tcs   Hiatal hernia 06/26/03   Dr. 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   Memory difficulty 06/03/2014   Shoulder fracture, left    Spastic colon    Wrist fracture     Past Surgical History:  Procedure Laterality Date   APPENDECTOMY     CATARACT EXTRACTION W/PHACO Right 01/14/2021   Procedure: CATARACT EXTRACTION PHACO AND INTRAOCULAR LENS PLACEMENT ([place]);  Surgeon: Baruch Goldmann, MD;  Location: [facility];  Service: Ophthalmology;  Laterality: Right;  CDE 8.87   CATARACT EXTRACTION W/PHACO Left 04/15/2021   Procedure: CATARACT EXTRACTION PHACO AND INTRAOCULAR LENS PLACEMENT ([facility]);  Surgeon: Baruch Goldmann, MD;  Location: [facility];  Service: Ophthalmology;  Laterality: Left;  CDE: 8.19   CHOLECYSTECTOMY     COLONOSCOPY  06/26/03   Dr. Kem Boroughs diverticula at the sigmoid colon with one of the transverse colon, normal terminal ileoscopy, small external hemorrhoids the   COLONOSCOPY N/A 01/20/2016   sigmoid diverticulosis, otherwise normal.     ESOPHAGOGASTRODUODENOSCOPY  03/15/2011   Dr. Trevor Iha hiatal hernia, abnormal gastric mucosa of unclear significance. Gastric biopsies negative, no H. pylori.Procedure: ESOPHAGOGASTRODUODENOSCOPY (EGD);  Surgeon: Daneil Dolin, MD;  [facility];  Service: Endoscopy;  Laterality: N/A;  7:30   ORIF HUMERUS FRACTURE Left 05/29/2016   Procedure: LEFT OPEN REDUCTION INTERNAL FIXATION (ORIF) PROXIMAL HUMERUS FRACTURE;  Surgeon: Meredith Pel, MD;  Location: [facility];  Service: Orthopedics;  Laterality: Left;   ORIF WRIST FRACTURE Left 05/29/2016   Procedure: OPEN REDUCTION INTERNAL FIXATION (ORIF) LEFT WRIST FRACTURE;  Surgeon: Meredith Pel, MD;  Location: [place];  Service: Orthopedics;  Laterality: Left;   RIGHT/LEFT HEART CATH AND CORONARY ANGIOGRAPHY N/A 04/17/2019   Procedure: RIGHT/LEFT HEART CATH AND CORONARY ANGIOGRAPHY;  Surgeon: Belva Crome, MD;  Location: [facility];  Service: Cardiovascular;  Laterality: N/A;   TUBAL LIGATION     UPPER GASTROINTESTINAL ENDOSCOPY  06/26/03   Dr. Wynelle Bourgeois sliding hiatal hernia with 8 mm tongue of gastric type mucosa at distal esophagus bile in the stomach.    Prior to Admission medications   Medication Sig Start Date End Date Taking? Authorizing Provider  acetaminophen (TYLENOL) 500 MG tablet Take 1,000 mg by mouth every 6 (six) hours as needed (headaches.).    [provider]  alendronate (FOSAMAX) 70 MG tablet Take 70 mg by mouth every Tuesday.  Patient not taking: Reported on 11/02/2021  07/27/18   [provider]  aspirin EC 81 MG tablet Take 81 mg by mouth daily.    [provider]  Calcium Carb-Cholecalciferol (CALCIUM 600+D3 PO) Take 1 tablet by mouth daily with supper.    [provider]  Cyanocobalamin (B-12 PO) Take 6,000 mcg by mouth daily.    [provider]  donepezil (ARICEPT) 10 MG tablet Take 1 tablet (10 mg total) by mouth at bedtime. 05/03/21   Suzzanne Cloud, NP   escitalopram (LEXAPRO) 10 MG tablet Take 10 mg by mouth daily with supper.  10/16/14   [provider]  megestrol (MEGACE) 20 MG tablet Take 20 mg by mouth 2 (two) times daily. Patient not taking: Reported on 11/02/2021 01/20/20   [provider]  memantine (NAMENDA) 10 MG tablet Take 1 tablet (10 mg total) by mouth 2 (two) times daily. 05/03/21   Suzzanne Cloud, NP  metoprolol succinate (TOPROL XL) 25 MG 24 hr tablet Take 1 tablet (25 mg total) by mouth daily. 08/13/20   Arnoldo Lenis, MD  SYNTHROID 88 MCG tablet Take 88 mcg by mouth See admin instructions. Take 1 tablet (88 mcg) by mouth on Sundays, Mondays, Tuesdays, Wednesdays,Thursdays, & Saturdays at bedtime, DOES NOT TAKE ANY Fridays. 12/15/17   [provider]    Allergies as of 12/20/2021 - Review Complete 12/20/2021  Allergen Reaction Noted   Penicillins Hives and Swelling 07/28/2010   Benadryl [diphenhydramine hcl] Hives 12/02/2010   Msm [methylsulfonylmethane] Hives 06/02/2014   Nubain [nalbuphine hcl] Hives 06/02/2014   Sulfa antibiotics Hives 07/28/2010   Codeine  07/28/2010    Family History  Problem Relation Age of Onset   Hypertension Mother    Angina Mother    COPD Father    Heart failure Father    Cancer Sister    Dementia Sister    Cardiomyopathy Brother    Pneumonia Other    Migraines Sister    Colon cancer Neg Hx     Social History   Socioeconomic History   Marital status: Married    Spouse name: [name]   Number of children: Not on file   Years of education: 12   Highest education level: Not on file  Occupational History   Not on file  Tobacco Use   Smoking status: Every Day    Packs/day: 0.50    Years: 45.00    Total pack years: 22.50    Types: Cigarettes    Start date: 09/10/1966   Smokeless tobacco: Never   Tobacco comments:    12/21/16 1 PPD  Vaping Use   Vaping Use: Never used  Substance and Sexual Activity   Alcohol use: No    Alcohol/week: 0.0 standard drinks  of alcohol   Drug use: No   Sexual activity: Yes  Other Topics Concern   Not on file  Social History Narrative   Patient is right handed.   Patient drinks 1-2 cups of caffeine daily.   Social Determinants of Health   Financial Resource Strain: Unknown (02/21/2018)   Overall Financial Resource Strain (CARDIA)    Difficulty of Paying Living Expenses: Patient refused  Food Insecurity: Unknown (02/21/2018)   Hunger Vital Sign    Worried About Running Out of Food in the Last Year: Patient refused    [place] in the Last Year: Patient refused  Transportation Needs: Unknown (02/21/2018)   PRAPARE - Hydrologist (Medical): Patient refused    Lack  of Transportation (Non-Medical): Patient refused  Physical Activity: Unknown (02/21/2018)   Exercise Vital Sign    Days of Exercise per Week: Patient refused    Minutes of Exercise per Session: Patient refused  Stress: Unknown (02/21/2018)   [place]them    Feeling of Stress : Patient refused  Social Connections: Unknown (02/21/2018)   Social Connection and Isolation Panel [NHANES]    Frequency of Communication with Friends and Family: Patient refused    Frequency of Social Gatherings with Friends and Family: Patient refused    Attends Religious Services: Patient refused    Active Member of Clubs or Organizations: Patient refused    Attends Archivist Meetings: Patient refused    Marital Status: Patient refused  Intimate Partner Violence: Unknown (02/21/2018)   Humiliation, Afraid, Rape, and Kick questionnaire    Fear of Current or Ex-Partner: Patient refused    Emotionally Abused: Patient refused    Physically Abused: Patient refused    Sexually Abused: Patient refused    Review of Systems: See HPI, otherwise negative ROS  Physical Exam: BP (!) 145/96 (BP Location: Right Arm, Patient Position: Sitting, Cuff Size: Normal)   Pulse 63    Temp 98.4 F (36.9 C) (Temporal)   Ht '5\' 2"'$  (1.575 m)   Wt 132 lb 6.4 oz (60.1 kg)   SpO2 96%   BMI 24.22 kg/m  General:   Alert,  Well-developed, well-nourished, pleasant and cooperative in NAD Neck:  Supple; no masses or thyromegaly. No significant cervical adenopathy. Lungs:  Clear throughout to auscultation.   No wheezes, crackles, or rhonchi. No acute distress. Heart:  Regular rate and rhythm; no murmurs, clicks, rubs,  or gallops. Abdomen: Non-distended, normal bowel sounds.  Soft and nontender without appreciable mass or hepatosplenomegaly.  Pulses:  Normal pulses noted. Extremities:  Without clubbing or edema.  Impression/Plan: 71 year old lady pleasantly demented with a history of self-limiting diarrhea last year likely infectious in origin.  Stool studies negative.  History of diverticulosis on prior colonoscopy 2017. She is virtually devoid of any GI symptoms these days.  We talked about 1 more screening colonoscopy in 2027 (patient would be 75).  We will reassess at that time assess her candidacy.  Recommendations:  No scheduled follow-up at this time.  Tentatively plan for 1 more screening colonoscopy 2027 depending on overall health  Should patient have any interim follow-up, she is to let me know.     Notice: This dictation was prepared with Dragon dictation along with smaller phrase technology. Any transcriptional errors that result from this process are unintentional and may not be corrected upon review.

## 2022-03-23 ENCOUNTER — Other Ambulatory Visit (HOSPITAL_COMMUNITY): Payer: Self-pay | Admitting: Family Medicine

## 2022-03-23 ENCOUNTER — Ambulatory Visit (HOSPITAL_COMMUNITY)
Admission: RE | Admit: 2022-03-23 | Discharge: 2022-03-23 | Disposition: A | Discharge status: Home / Self Care | Payer: Medicare HMO | Source: Ambulatory Visit | Attending: Family Medicine | Admitting: Family Medicine

## 2022-03-23 DIAGNOSIS — M79641 Pain in right hand: Secondary | ICD-10-CM | POA: Diagnosis present

## 2022-03-23 DIAGNOSIS — M19041 Primary osteoarthritis, right hand: Secondary | ICD-10-CM | POA: Insufficient documentation

## 2022-05-08 ENCOUNTER — Ambulatory Visit: Payer: Medicare HMO | Attending: Cardiology | Admitting: Cardiology

## 2022-05-08 ENCOUNTER — Encounter: Payer: Self-pay | Admitting: Cardiology

## 2022-05-08 VITALS — BP 134/78 | HR 64 | Ht 62.0 in | Wt 133.0 lb

## 2022-05-08 DIAGNOSIS — I5022 Chronic systolic (congestive) heart failure: Secondary | ICD-10-CM | POA: Diagnosis not present

## 2022-05-08 MED ORDER — DAPAGLIFLOZIN PROPANEDIOL 10 MG PO TABS
10.0000 mg | ORAL_TABLET | Freq: Every day | ORAL | 11 refills | Status: DC
Start: 2022-05-08 — End: 2023-04-23

## 2022-05-08 NOTE — Progress Notes (Signed)
Clinical Summary Melanie Bradley is a 72 y.o.female seen today for follow up of the following medical problems.      1. Chronic systolic HF - new diagnosis during 10/2018 admission with syncope. Echo showed LVEF 35-40% Jan 2021 echo LVEF 35-40%, mild to mod MR.  04/2019 no significant CAD, normal filling pressures. CI 3.7 Mean PA 15, PCWP 7 07/2019 CMRI: LVEF 38%, normal RV. No infiltrative disease   - medical therapy has historically been limited by renal dysfunction, low bp's, and recurrent issues with dehydration.     - we restarted aldactone, caused decrease in sodium and elevation in Cr. We discontinued   04/2021 echo LVEF 30-35%,  - no recent SOB/DOE, no LE edema.     2. Chronic LBBB - no significant CAD by cath 04/2019       3.  Memory deficit/Dementia - on aricept, followed by neuro -remains active, independent. Enjoys doing yard and housework.       4. CKD -  last Cr 1.5   Past Medical History:  Diagnosis Date   Atrial fibrillation (HCC)    Bloating    Complication of anesthesia     " hard to wake up sometimes "   Dementia ([place])    Diverticula of colon    Epigastric burning sensation    GERD (gastroesophageal reflux disease)    Headache disorder 06/03/2014   [id number])    Helicobacter pylori gastritis 2004   Hemorrhoids 06/26/03   Dr. Laural Golden tcs   Hiatal hernia 06/26/03   Dr. 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   Memory difficulty 06/03/2014   Shoulder fracture, left    Spastic colon    Wrist fracture      Allergies  Allergen Reactions   Penicillins Hives and Swelling    Has patient had a PCN reaction causing IMMEDIATE RASH, FACIAL/TONGUE/THROAT SWELLING, SOB, OR LIGHTHEADEDNESS WITH HYPOTENSION:  #  #  #  YES  #  #  #  Has patient had a PCN reaction causing severe rash involving mucus membranes or skin necrosis: No Has patient had a PCN reaction that required hospitalization  No Has patient had a PCN reaction occurring within the last 10 years: No If all of the above answers are "NO", then may proceed with Cephalosporin use.    Benadryl [Diphenhydramine Hcl] Hives   Msm [Methylsulfonylmethane] Hives   Nubain [Nalbuphine Hcl] Hives   Sulfa Antibiotics Hives   Codeine     UNSPECIFIED REACTION [IF AT ALL] REFUSES TO TAKE     Current Outpatient Medications  Medication Sig Dispense Refill   acetaminophen (TYLENOL) 500 MG tablet Take 1,000 mg by mouth every 6 (six) hours as needed (headaches.).     alendronate (FOSAMAX) 70 MG tablet Take 70 mg by mouth every Tuesday.  (Patient not taking: Reported on 11/02/2021)     aspirin EC 81 MG tablet Take 81 mg by mouth daily.     Calcium Carb-Cholecalciferol (CALCIUM 600+D3 PO) Take 1 tablet by mouth daily with supper.     Cyanocobalamin (B-12 PO) Take 6,000 mcg by mouth daily.     donepezil (ARICEPT) 10 MG tablet Take 1 tablet (10 mg total) by mouth at bedtime. 90 tablet 3   escitalopram (LEXAPRO) 10 MG tablet Take 10 mg by mouth daily with supper.   0   megestrol (MEGACE) 20 MG tablet Take 20  mg by mouth 2 (two) times daily. (Patient not taking: Reported on 11/02/2021)     memantine (NAMENDA) 10 MG tablet Take 1 tablet (10 mg total) by mouth 2 (two) times daily. 180 tablet 3   metoprolol succinate (TOPROL XL) 25 MG 24 hr tablet Take 1 tablet (25 mg total) by mouth daily. 90 tablet 3   SYNTHROID 88 MCG tablet Take 88 mcg by mouth See admin instructions. Take 1 tablet (88 mcg) by mouth on Sundays, Mondays, Tuesdays, Wednesdays,Thursdays, & Saturdays at bedtime, DOES NOT TAKE ANY Fridays.     Current Facility-Administered Medications  Medication Dose Route Frequency Provider Last Rate Last Admin   sodium chloride flush (NS) 0.9 % injection 3 mL  3 mL Intravenous Q12H Arnoldo Lenis, MD         Past Surgical History:  Procedure Laterality Date   APPENDECTOMY     CATARACT EXTRACTION W/PHACO Right 01/14/2021    Procedure: CATARACT EXTRACTION PHACO AND INTRAOCULAR LENS PLACEMENT ([place]);  Surgeon: Baruch Goldmann, MD;  Location: [facility];  Service: Ophthalmology;  Laterality: Right;  CDE 8.87   CATARACT EXTRACTION W/PHACO Left 04/15/2021   Procedure: CATARACT EXTRACTION PHACO AND INTRAOCULAR LENS PLACEMENT ([facility]);  Surgeon: Baruch Goldmann, MD;  Location: [facility];  Service: Ophthalmology;  Laterality: Left;  CDE: 8.19   CHOLECYSTECTOMY     COLONOSCOPY  06/26/03   Dr. Kem Boroughs diverticula at the sigmoid colon with one of the transverse colon, normal terminal ileoscopy, small external hemorrhoids the   COLONOSCOPY N/A 01/20/2016   sigmoid diverticulosis, otherwise normal.    ESOPHAGOGASTRODUODENOSCOPY  03/15/2011   Dr. Trevor Iha hiatal hernia, abnormal gastric mucosa of unclear significance. Gastric biopsies negative, no H. pylori.Procedure: ESOPHAGOGASTRODUODENOSCOPY (EGD);  Surgeon: Daneil Dolin, MD;  [facility];  Service: Endoscopy;  Laterality: N/A;  7:30   ORIF HUMERUS FRACTURE Left 05/29/2016   Procedure: LEFT OPEN REDUCTION INTERNAL FIXATION (ORIF) PROXIMAL HUMERUS FRACTURE;  Surgeon: Meredith Pel, MD;  Location: [facility];  Service: Orthopedics;  Laterality: Left;   ORIF WRIST FRACTURE Left 05/29/2016   Procedure: OPEN REDUCTION INTERNAL FIXATION (ORIF) LEFT WRIST FRACTURE;  Surgeon: Meredith Pel, MD;  Location: [place];  Service: Orthopedics;  Laterality: Left;   RIGHT/LEFT HEART CATH AND CORONARY ANGIOGRAPHY N/A 04/17/2019   Procedure: RIGHT/LEFT HEART CATH AND CORONARY ANGIOGRAPHY;  Surgeon: Belva Crome, MD;  Location: [place] [facility];  Service: Cardiovascular;  Laterality: N/A;   TUBAL LIGATION     UPPER GASTROINTESTINAL ENDOSCOPY  06/26/03   Dr. Wynelle Bourgeois sliding hiatal hernia with 8 mm tongue of gastric type mucosa at distal esophagus bile in the stomach.     Allergies  Allergen Reactions   Penicillins Hives and Swelling    Has patient had a PCN reaction causing  IMMEDIATE RASH, FACIAL/TONGUE/THROAT SWELLING, SOB, OR LIGHTHEADEDNESS WITH HYPOTENSION:  #  #  #  YES  #  #  #  Has patient had a PCN reaction causing severe rash involving mucus membranes or skin necrosis: No Has patient had a PCN reaction that required hospitalization No Has patient had a PCN reaction occurring within the last 10 years: No If all of the above answers are "NO", then may proceed with Cephalosporin use.    Benadryl [Diphenhydramine Hcl] Hives   Msm [Methylsulfonylmethane] Hives   Nubain [Nalbuphine Hcl] Hives   Sulfa Antibiotics Hives   Codeine     UNSPECIFIED REACTION [IF AT ALL] REFUSES TO TAKE      Family  History  Problem Relation Age of Onset   Hypertension Mother    Angina Mother    COPD Father    Heart failure Father    Cancer Sister    Dementia Sister    Cardiomyopathy Brother    Pneumonia Other    Migraines Sister    Colon cancer Neg Hx      Social History Melanie Bradley reports that she has been smoking cigarettes. She started smoking about 55 years ago. She has a 22.50 pack-year smoking history. She has never used smokeless tobacco. Melanie Bradley reports no history of alcohol use.   Review of Systems CONSTITUTIONAL: No weight loss, fever, chills, weakness or fatigue.  HEENT: Eyes: No visual loss, blurred vision, double vision or yellow sclerae.No hearing loss, sneezing, congestion, runny nose or sore throat.  SKIN: No rash or itching.  CARDIOVASCULAR: per hpi RESPIRATORY: No shortness of breath, cough or sputum.  GASTROINTESTINAL: No anorexia, nausea, vomiting or diarrhea. No abdominal pain or blood.  GENITOURINARY: No burning on urination, no polyuria NEUROLOGICAL: No headache, dizziness, syncope, paralysis, ataxia, numbness or tingling in the extremities. No change in bowel or bladder control.  MUSCULOSKELETAL: No muscle, back pain, joint pain or stiffness.  LYMPHATICS: No enlarged nodes. No history of splenectomy.  PSYCHIATRIC: No history of  depression or anxiety.  ENDOCRINOLOGIC: No reports of sweating, cold or heat intolerance. No polyuria or polydipsia.  Marland Kitchen   Physical Examination Today's Vitals   05/08/22 1327  BP: (!) 156/90  Pulse: 64  SpO2: 94%  Weight: 133 lb (60.3 kg)  Height: 5\' 2"  (1.575 m)   Body mass index is 24.33 kg/m.  Gen: resting comfortably, no acute distress HEENT: no scleral icterus, pupils equal round and reactive, no palptable cervical adenopathy,  CV: RRR, no m/rg, no jvd Resp: Clear to auscultation bilaterally GI: abdomen is soft, non-tender, non-distended, normal bowel sounds, no hepatosplenomegaly MSK: extremities are warm, no edema.  Skin: warm, no rash Neuro:  no focal deficits Psych: appropriate affect   Diagnostic Studies  04/2013 Lexiscan MPI FINDINGS:   The patient's initial EKG revealed a narrow QRS. After she received   Lexiscan, the heart rate increased to 145 and the QRS widened to a   left bundle Peggi Yono block. The increased rate persisted for several   minutes but then began to slow. When the heart rate was slower, P   waves could be seen. There were no flutter waves. The raw data   reveals no evidence of excess motion. Tomographic images with stress   reveal a small area of moderate decreased uptake in the septum. Rest   images reveal a moderate area of decreased uptake in the septum.   These findings are most consistent with left bundle Jacquelyne Quarry block.   Wall motion analysis reveals excellent motion with an ejection   fraction of 70%.   IMPRESSION:   This is a low risk scan. There is no scar or ischemia. There is   decreased activity in the septum, consistent with left bundle Franklin Baumbach   block. It is noted that the QRS was narrow at the start of the   study. However with Lexiscan, the patient developed tachycardia with   left bundle Janaa Acero block. Assessment shows that this was most   probably sinus tachycardia which slowed over time.   10/2018 echo IMPRESSIONS      1.  The left ventricle has moderately reduced systolic function, with an ejection fraction of 35-40%. The cavity size was normal. Severe  focal basal septal hypertrophy.. Diastolic dysfunction, grade indeterminate. Left ventricular diffuse hypokinesis.  2. The right ventricle has normal systolic function. The cavity was normal. There is no increase in right ventricular wall thickness.  3. The aortic valve is tricuspid. Mild thickening of the aortic valve. Mild aortic annular calcification noted.  4. The mitral valve is grossly normal. Mild thickening of the mitral valve leaflet. Mitral valve regurgitation is mild to moderate by color flow Doppler.  5. The tricuspid valve is grossly normal.  6. The aorta is normal unless otherwise noted.   12/2018 30 day event monitor 30 day event monitor Min HR 57, Max HR 130, Avg HR 79 No symptoms reported Telemetry tracings show sinus rhythm with occasional PVCs. No significant arrhythmias   Jan 2021 echo IMPRESSIONS     1. Left ventricular ejection fraction, by visual estimation, is 35 to  40%. The left ventricle has moderately decreased function. There is no  left ventricular hypertrophy.   2. Left ventricular diastolic parameters are indeterminate.   3. The left ventricle demonstrates global hypokinesis.   4. Global right ventricle has normal systolic function.The right  ventricular size is normal. No increase in right ventricular wall  thickness.   5. Left atrial size was normal.   6. Right atrial size was normal.   7. The mitral valve is normal in structure. Mild to moderate mitral valve  regurgitation. No evidence of mitral stenosis.   8. The tricuspid valve is normal in structure.   9. The aortic valve has an indeterminant number of cusps. Aortic valve  regurgitation is not visualized. No evidence of aortic valve sclerosis or  stenosis.  10. The pulmonic valve was not well visualized. Pulmonic valve  regurgitation is not visualized.  11. The  inferior vena cava is normal in size with greater than 50%  respiratory variability, suggesting right atrial pressure of 3 mmHg.       04/2019 RHC/LHC Normal right heart pressures.  Cardiac output 3.7 L/min.  Cardiac index 2.5 L/min/m Normal left main Transapical LAD.  First diagonal contains segmental proximal 40% narrowing. Normal small ramus. Circumflex is normal. The right coronary demonstrates generalized minimal atherosclerosis.  No focal stenosis. Anterior and apical mild to moderate hypokinesis.  Estimated ejection fraction 35 to 40%.   RECOMMENDATIONS:   Guideline directed therapy for left ventricular systolic dysfunction in the setting of left bundle Mehki Klumpp block.  If no improvement in LV function with therapy, consider resync.    04/3021 echo    1. Left ventricular ejection fraction, by estimation, is 30 to 35%. The  left ventricle has moderately decreased function. The left ventricle  demonstrates global hypokinesis. There is mild left ventricular  hypertrophy. Left ventricular diastolic  parameters are indeterminate.   2. Right ventricular systolic function is normal. The right ventricular  size is normal. Tricuspid regurgitation signal is inadequate for assessing  PA pressure.   3. The mitral valve is normal in structure. Trivial mitral valve  regurgitation.   4. The aortic valve is tricuspid. Aortic valve regurgitation is not  visualized. No aortic stenosis is present.      Assessment and Plan   Chronic systolic HF -recent limitations on medication regimen due to soft bp's and renal dysfunction, recurrent issues with hypovolemia.  - currently just on toprol. Recent retrial of aldactone caused drop in Na and elevation in Cr. - Cr has improved since last year, we will try farxiga 10mg  dailyt, check bmet/mg in 2 weeks  Arnoldo Lenis, M.D.

## 2022-05-08 NOTE — Patient Instructions (Signed)
Medication Instructions:  Your physician has recommended you make the following change in your medication:  - Start Farxiga 10 mg tablets once daily  *If you need a refill on your cardiac medications before your next appointment, please call your pharmacy*   Lab Work: In 2 weeks: - BMET - MAG If you have labs (blood work) drawn today and your tests are completely normal, you will receive your results only by: [place] (if you have MyChart) OR A paper copy in the mail If you have any lab test that is abnormal or we need to change your treatment, we will call you to review the results.   Testing/Procedures: None   Follow-Up: At [facility], you and your health needs are our priority.  As part of our continuing mission to provide you with exceptional heart care, we have created designated Provider Care Teams.  These Care Teams include your primary Cardiologist (physician) and Advanced Practice Providers (APPs -  Physician Assistants and Nurse Practitioners) who all work together to provide you with the care you need, when you need it.  We recommend signing up for the patient portal called "MyChart".  Sign up information is provided on this After Visit Summary.  MyChart is used to connect with patients for Virtual Visits (Telemedicine).  Patients are able to view lab/test results, encounter notes, upcoming appointments, etc.  Non-urgent messages can be sent to your provider as well.   To learn more about what you can do with MyChart, go to [email or web address].    Your next appointment:   4 month(s)  Provider:   Carlyle Dolly, MD    Other Instructions

## 2022-05-23 ENCOUNTER — Other Ambulatory Visit (HOSPITAL_COMMUNITY)
Admission: RE | Admit: 2022-05-23 | Discharge: 2022-05-23 | Disposition: A | Discharge status: Home / Self Care | Payer: Medicare HMO | Source: Ambulatory Visit | Attending: Cardiology | Admitting: Cardiology

## 2022-05-23 DIAGNOSIS — I5022 Chronic systolic (congestive) heart failure: Secondary | ICD-10-CM | POA: Diagnosis present

## 2022-05-23 LAB — BASIC METABOLIC PANEL
Anion gap: 7 (ref 5–15)
BUN: 22 mg/dL (ref 8–23)
CO2: 32 mmol/L (ref 22–32)
Calcium: 9 mg/dL (ref 8.9–10.3)
Chloride: 100 mmol/L (ref 98–111)
Creatinine, Ser: 1.65 mg/dL — ABNORMAL HIGH (ref 0.44–1.00)
GFR, Estimated: 33 mL/min — ABNORMAL LOW (ref >60)
Glucose, Bld: 74 mg/dL (ref 70–99)
Potassium: 3.2 mmol/L — ABNORMAL LOW (ref 3.5–5.1)
Sodium: 139 mmol/L (ref 135–145)

## 2022-05-23 LAB — MAGNESIUM: Magnesium: 2 mg/dL (ref 1.7–2.4)

## 2022-05-31 ENCOUNTER — Other Ambulatory Visit: Payer: Self-pay | Admitting: Neurology

## 2022-06-05 ENCOUNTER — Telehealth: Payer: Self-pay

## 2022-06-05 MED ORDER — POTASSIUM CHLORIDE CRYS ER 20 MEQ PO TBCR: EXTENDED_RELEASE_TABLET | ORAL | 0 refills | Status: DC

## 2022-06-05 NOTE — Telephone Encounter (Signed)
Patient notified and verbalized understanding. Patient had no questions or concerns at this time. PCP copied 

## 2022-06-05 NOTE — Telephone Encounter (Signed)
-----   Message from Antoine Poche, MD sent at 06/03/2022  8:58 AM EDT ----- Kidney function remains decreased but overall stable from last several checks. Potassium low, can she take potassium chloride Eq daily x 3 days  Dominga Ferry MD

## 2022-07-26 ENCOUNTER — Other Ambulatory Visit (HOSPITAL_COMMUNITY): Payer: Self-pay | Admitting: Internal Medicine

## 2022-07-26 ENCOUNTER — Ambulatory Visit (HOSPITAL_COMMUNITY)
Admission: RE | Admit: 2022-07-26 | Discharge: 2022-07-26 | Disposition: A | Discharge status: Home / Self Care | Payer: Medicare HMO | Source: Ambulatory Visit | Attending: Internal Medicine | Admitting: Internal Medicine

## 2022-07-26 DIAGNOSIS — M25531 Pain in right wrist: Secondary | ICD-10-CM | POA: Diagnosis present

## 2022-07-26 DIAGNOSIS — M25539 Pain in unspecified wrist: Secondary | ICD-10-CM | POA: Diagnosis present

## 2022-07-26 DIAGNOSIS — M79641 Pain in right hand: Secondary | ICD-10-CM

## 2022-07-26 DIAGNOSIS — G8929 Other chronic pain: Secondary | ICD-10-CM | POA: Insufficient documentation

## 2022-07-28 ENCOUNTER — Encounter: Payer: Medicare HMO | Admitting: Orthopedic Surgery

## 2022-08-03 ENCOUNTER — Other Ambulatory Visit (HOSPITAL_COMMUNITY): Payer: Self-pay | Admitting: Family Medicine

## 2022-08-03 ENCOUNTER — Ambulatory Visit (HOSPITAL_COMMUNITY)
Admission: RE | Admit: 2022-08-03 | Discharge: 2022-08-03 | Disposition: A | Discharge status: Home / Self Care | Payer: Medicare HMO | Source: Ambulatory Visit | Attending: Family Medicine | Admitting: Family Medicine

## 2022-08-03 DIAGNOSIS — M79641 Pain in right hand: Secondary | ICD-10-CM | POA: Insufficient documentation

## 2022-09-06 NOTE — Progress Notes (Unsigned)
Cardiology Office Note    Date:  09/07/2022  ID:  Melanie Bradley, DOB 1950-02-29, MRN [id number] Cardiologist: Dina Rich, MD    History of Present Illness:    Melanie Bradley is a 72 y.o. female with past medical history of chronic HFrEF (EF 35-40% in 10/2018 with cath in 04/2019 showing no significant CAD and cMRI in 07/2019 with no infiltrative disease, EF at 30-35% by most recent echo in 04/2021), HTN, LBBB, Stage 3 CKD and dementia who presents to the office today for 15-month follow-up.   She was last examined by Dr. Wyline Mood in 04/2022 and denied any recent chest pain or dyspnea on exertion. Medical therapy for her cardiomyopathy had been limited by renal dysfunction, hypotension and recurrent issues with dehydration. She was just on Toprol-XL 25 mg daily at the time of her office visit as the recent addition of Spironolactone had caused worsening renal function and hyponatremia. It was recommended to try Farxiga 10 mg daily with close follow-up labs. BMET on 05/23/2022 showed K+ was low at 3.2 and creatinine was stable at 1.65.  She was prescribed potassium supplementation for 3 days.  In talking with the patient and her husband today, most history is provided by her husband given her dementia. She does report she overall feels well today. He reports that she has "good and bad days". They do try to stay out and about. She denies any specific chest pain, dyspnea on exertion, orthopnea, or PND. She does have occasional pitting edema but this has improved with wearing compression stockings daily. They eat at local restaurants on a daily basis and we reviewed the role sodium intake could be playing in her edema.  Studies Reviewed:   EKG: EKG is not ordered today.  R/LHC: 04/2019 Normal right heart pressures.  Cardiac output 3.7 L/min.  Cardiac index 2.5 L/min/m Normal left main Transapical LAD.  First diagonal contains segmental proximal 40% narrowing. Normal small  ramus. Circumflex is normal. The right coronary demonstrates generalized minimal atherosclerosis.  No focal stenosis. Anterior and apical mild to moderate hypokinesis.  Estimated ejection fraction 35 to 40%.   RECOMMENDATIONS:   Guideline directed therapy for left ventricular systolic dysfunction in the setting of left bundle branch block.  If no improvement in LV function with therapy, consider resync.  Echocardiogram: 04/2021 IMPRESSIONS     1. Left ventricular ejection fraction, by estimation, is 30 to 35%. The  left ventricle has moderately decreased function. The left ventricle  demonstrates global hypokinesis. There is mild left ventricular  hypertrophy. Left ventricular diastolic  parameters are indeterminate.   2. Right ventricular systolic function is normal. The right ventricular  size is normal. Tricuspid regurgitation signal is inadequate for assessing  PA pressure.   3. The mitral valve is normal in structure. Trivial mitral valve  regurgitation.   4. The aortic valve is tricuspid. Aortic valve regurgitation is not  visualized. No aortic stenosis is present.   Physical Exam:   VS:  BP 110/70   Pulse (!) 55   Ht 5' (1.524 m)   Wt 130 lb (59 kg)   SpO2 96%   BMI 25.39 kg/m    Wt Readings from Last 3 Encounters:  09/07/22 130 lb (59 kg)  05/08/22 133 lb (60.3 kg)  12/20/21 132 lb 6.4 oz (60.1 kg)     GEN: Well nourished, well developed female appearing in no acute distress NECK: No JVD; No carotid bruits CARDIAC: RRR, no murmurs, rubs, gallops RESPIRATORY:  Clear to auscultation without rales, wheezing or rhonchi  ABDOMEN: Appears non-distended. No obvious abdominal masses. EXTREMITIES: No clubbing or cyanosis. No pitting edema.  Distal pedal pulses are 2+ bilaterally.   Assessment and Plan:   1. Chronic HFrEF/NICM - Her EF was at 30 to 35% on most recent imaging in 2023 and prior catheterization in 2021 showed no significant CAD with cardiac MRI showing  no evidence of infiltrative disease. - She appears euvolemic by examination today and denies any recent respiratory issues. By review of notes, medical therapy has been limited given episodes of orthostatic hypotension, dehydration and variable renal function. While she does have a known LBBB, I doubt she would be a candidate for CRT given her dementia but will review with Dr. Wyline Mood. Continue current medical therapy with Toprol-XL 25 mg daily and Farxiga 10 mg daily. She is not on an ACE-I/ARB/ARNI given soft BP and did not tolerate Spironolactone due to dehydration and worsening renal function.   2. LBBB - Noted on EKG tracings. Prior catheterization in 2021 showed normal coronary arteries.  3. HTN - BP is well-controlled at 110/70 during today's visit. Continue current medical therapy with Toprol-XL 25 mg daily.  4. Stage 3 CKD/Hypokalemia - Baseline creatinine 1.6 - 1.8. Stable at 1.65 when checked in 05/2022 but K+ was low at 3.2.  Will recheck a BMET today.   Signed, Ellsworth Lennox, PA-C

## 2022-09-07 ENCOUNTER — Other Ambulatory Visit (HOSPITAL_COMMUNITY)
Admission: RE | Admit: 2022-09-07 | Discharge: 2022-09-07 | Disposition: A | Discharge status: Home / Self Care | Payer: Medicare HMO | Source: Ambulatory Visit | Attending: Student | Admitting: Student

## 2022-09-07 ENCOUNTER — Encounter: Payer: Self-pay | Admitting: Student

## 2022-09-07 ENCOUNTER — Ambulatory Visit: Payer: Medicare HMO | Attending: Student | Admitting: Student

## 2022-09-07 VITALS — BP 110/70 | HR 55 | Ht 60.0 in | Wt 130.0 lb

## 2022-09-07 DIAGNOSIS — I5022 Chronic systolic (congestive) heart failure: Secondary | ICD-10-CM | POA: Insufficient documentation

## 2022-09-07 DIAGNOSIS — I447 Left bundle-branch block, unspecified: Secondary | ICD-10-CM

## 2022-09-07 DIAGNOSIS — Z79899 Other long term (current) drug therapy: Secondary | ICD-10-CM | POA: Insufficient documentation

## 2022-09-07 DIAGNOSIS — I10 Essential (primary) hypertension: Secondary | ICD-10-CM

## 2022-09-07 DIAGNOSIS — N1832 Chronic kidney disease, stage 3b: Secondary | ICD-10-CM

## 2022-09-07 LAB — BASIC METABOLIC PANEL
Anion gap: 5 (ref 5–15)
BUN: 23 mg/dL (ref 8–23)
CO2: 30 mmol/L (ref 22–32)
Calcium: 8.5 mg/dL — ABNORMAL LOW (ref 8.9–10.3)
Chloride: 100 mmol/L (ref 98–111)
Creatinine, Ser: 1.46 mg/dL — ABNORMAL HIGH (ref 0.44–1.00)
GFR, Estimated: 38 mL/min — ABNORMAL LOW (ref >60)
Glucose, Bld: 94 mg/dL (ref 70–99)
Potassium: 4 mmol/L (ref 3.5–5.1)
Sodium: 135 mmol/L (ref 135–145)

## 2022-09-07 NOTE — Patient Instructions (Signed)
Medication Instructions:   Continue current medications.  *If you need a refill on your cardiac medications before your next appointment, please call your pharmacy*   Lab Work:  BMET today.   If you have labs (blood work) drawn today and your tests are completely normal, you will receive your results only by: MyChart Message (if you have MyChart) OR A paper copy in the mail If you have any lab test that is abnormal or we need to change your treatment, we will call you to review the results.  Follow-Up: At [facility], you and your health needs are our priority.  As part of our continuing mission to provide you with exceptional heart care, we have created designated Provider Care Teams.  These Care Teams include your primary Cardiologist (physician) and Advanced Practice Providers (APPs -  Physician Assistants and Nurse Practitioners) who all work together to provide you with the care you need, when you need it.  We recommend signing up for the patient portal called "MyChart".  Sign up information is provided on this After Visit Summary.  MyChart is used to connect with patients for Virtual Visits (Telemedicine).  Patients are able to view lab/test results, encounter notes, upcoming appointments, etc.  Non-urgent messages can be sent to your provider as well.   To learn more about what you can do with MyChart, go to [email or web address].    Your next appointment:   6 month(s)  Provider:   You may see Dina Rich, MD or one of the following Advanced Practice Providers on your designated Care Team:   [place], PA-C  Jacolyn Reedy, [place]

## 2022-09-08 ENCOUNTER — Telehealth: Payer: Self-pay | Admitting: Cardiology

## 2022-09-08 NOTE — Telephone Encounter (Signed)
Pt's husband returning call regarding results. Please advise

## 2022-09-08 NOTE — Telephone Encounter (Signed)
Husband notified of test results

## 2022-11-20 ENCOUNTER — Other Ambulatory Visit (HOSPITAL_COMMUNITY): Payer: Self-pay | Admitting: Family Medicine

## 2022-11-20 DIAGNOSIS — Z1231 Encounter for screening mammogram for malignant neoplasm of breast: Secondary | ICD-10-CM

## 2022-12-15 ENCOUNTER — Ambulatory Visit (HOSPITAL_COMMUNITY)
Admission: RE | Admit: 2022-12-15 | Discharge: 2022-12-15 | Disposition: A | Discharge status: Home / Self Care | Payer: Medicare HMO | Source: Ambulatory Visit | Attending: Family Medicine | Admitting: Family Medicine

## 2022-12-15 DIAGNOSIS — Z1231 Encounter for screening mammogram for malignant neoplasm of breast: Secondary | ICD-10-CM | POA: Insufficient documentation

## 2023-02-28 ENCOUNTER — Encounter: Payer: Self-pay | Admitting: Cardiology

## 2023-02-28 ENCOUNTER — Ambulatory Visit: Payer: Medicare HMO | Attending: Cardiology | Admitting: Cardiology

## 2023-02-28 VITALS — BP 130/64 | HR 62 | Ht 61.0 in | Wt 135.0 lb

## 2023-02-28 DIAGNOSIS — I5022 Chronic systolic (congestive) heart failure: Secondary | ICD-10-CM | POA: Diagnosis not present

## 2023-02-28 NOTE — Progress Notes (Signed)
 Clinical Summary Melanie Bradley is a 73 y.o.female seen today for follow up of the following medical problems.      1. Chronic systolic HF - new diagnosis during 10/2018 admission with syncope. Echo showed LVEF 35-40% Jan 2021 echo LVEF 35-40%, mild to mod MR.  04/2019 no significant CAD, normal filling pressures. CI 3.7 Mean PA 15, PCWP 7 07/2019 CMRI: LVEF 38%, normal RV. No infiltrative disease   - medical therapy has historically been limited by renal dysfunction, low bp's, and recurrent issues with dehydration.     - we restarted aldactone , caused decrease in sodium and elevation in Cr. We discontinued   04/2021 echo LVEF 30-35% - severe dementia, poor ICD candidate.   - no SOB/DOE, slight LE edema. - compliant with meds     2. Chronic LBBB - no significant CAD by cath 04/2019       3.  Memory deficit/Dementia - on aricept , followed by neuro -remains active, independent. Enjoys doing yard and housework.       4. CKD IIIB -  last Cr 1.46, GFR 38.   Past Medical History:  Diagnosis Date   Atrial fibrillation (HCC)    Bloating    Complication of anesthesia     " hard to wake up sometimes "   Dementia (HCC)    Diverticula of colon    Epigastric burning sensation    GERD (gastroesophageal reflux disease)    Headache disorder 06/03/2014   Headache(784.0)    Helicobacter pylori gastritis 2004   Hemorrhoids 06/26/2003   Dr. Homero Luster tcs   HFrEF (heart failure with reduced ejection fraction) (HCC)    a. EF 35-40% in 10/2018 with cath in 04/2019 showing no significant CAD and cMRI in 07/2019 with no infiltrative disease, EF at 30-35% by most recent echo in 04/2021   Hiatal hernia 06/26/2003   Dr. Homero Luster egd   Hypertension    Hypothyroidism    Left bundle Daveda Larock block    rate dependent LBBB 04/2013   Memory difficulty 06/03/2014   Shoulder fracture, left    Spastic colon    Wrist fracture      Allergies  Allergen Reactions   Penicillins Hives and Swelling     Has patient had a PCN reaction causing IMMEDIATE RASH, FACIAL/TONGUE/THROAT SWELLING, SOB, OR LIGHTHEADEDNESS WITH HYPOTENSION:  #  #  #  YES  #  #  #  Has patient had a PCN reaction causing severe rash involving mucus membranes or skin necrosis: No Has patient had a PCN reaction that required hospitalization No Has patient had a PCN reaction occurring within the last 10 years: No If all of the above answers are "NO", then may proceed with Cephalosporin use.    Benadryl [Diphenhydramine Hcl] Hives   Msm [Methylsulfonylmethane] Hives   Nubain [Nalbuphine Hcl] Hives   Sulfa Antibiotics Hives   Codeine     UNSPECIFIED REACTION [IF AT ALL] REFUSES TO TAKE     Current Outpatient Medications  Medication Sig Dispense Refill   acetaminophen  (TYLENOL ) 500 MG tablet Take 1,000 mg by mouth every 6 (six) hours as needed (headaches.).     aspirin  EC 81 MG tablet Take 81 mg by mouth daily.     Calcium  Carb-Cholecalciferol (CALCIUM  600+D3 PO) Take 1 tablet by mouth daily with supper.     clonazePAM  (KLONOPIN ) 0.25 MG disintegrating tablet Take 0.25 mg by mouth 3 (three) times daily as needed.     Cyanocobalamin  (B-12 PO) Take  6,000 mcg by mouth daily.     dapagliflozin  propanediol (FARXIGA ) 10 MG TABS tablet Take 1 tablet (10 mg total) by mouth daily before breakfast. 30 tablet 11   donepezil  (ARICEPT ) 10 MG tablet Take 1 tablet (10 mg total) by mouth at bedtime. 90 tablet 3   escitalopram  (LEXAPRO ) 10 MG tablet Take 10 mg by mouth daily with supper.   0   megestrol (MEGACE) 20 MG tablet Take 20 mg by mouth 2 (two) times daily. (Patient not taking: Reported on 09/07/2022)     memantine  (NAMENDA ) 10 MG tablet Take 1 tablet (10 mg total) by mouth 2 (two) times daily. 180 tablet 3   metoprolol  succinate (TOPROL  XL) 25 MG 24 hr tablet Take 1 tablet (25 mg total) by mouth daily. 90 tablet 3   potassium chloride  SA (KLOR-CON  M) 20 MEQ tablet Take 40 mEq (2 tablets) by mouth for 3 days. 6 tablet 0    SYNTHROID  88 MCG tablet Take 88 mcg by mouth See admin instructions. Take 1 tablet (88 mcg) by mouth on Sundays, Mondays, Tuesdays, Wednesdays,Thursdays, & Saturdays at bedtime, DOES NOT TAKE ANY Fridays.     Current Facility-Administered Medications  Medication Dose Route Frequency Provider Last Rate Last Admin   sodium chloride  flush (NS) 0.9 % injection 3 mL  3 mL Intravenous Q12H Laurann Pollock, MD         Past Surgical History:  Procedure Laterality Date   APPENDECTOMY     CATARACT EXTRACTION W/PHACO Right 01/14/2021   Procedure: CATARACT EXTRACTION PHACO AND INTRAOCULAR LENS PLACEMENT (IOC);  Surgeon: Tarri Farm, MD;  Location: [facility];  Service: Ophthalmology;  Laterality: Right;  CDE 8.87   CATARACT EXTRACTION W/PHACO Left 04/15/2021   Procedure: CATARACT EXTRACTION PHACO AND INTRAOCULAR LENS PLACEMENT (IOC);  Surgeon: Tarri Farm, MD;  Location: [facility];  Service: Ophthalmology;  Laterality: Left;  CDE: 8.19   CHOLECYSTECTOMY     COLONOSCOPY  06/26/03   Dr. Hilario Lover diverticula at the sigmoid colon with one of the transverse colon, normal terminal ileoscopy, small external hemorrhoids the   COLONOSCOPY N/A 01/20/2016   sigmoid diverticulosis, otherwise normal.    ESOPHAGOGASTRODUODENOSCOPY  03/15/2011   Dr. Gustavus Leisure hiatal hernia, abnormal gastric mucosa of unclear significance. Gastric biopsies negative, no H. pylori.Procedure: ESOPHAGOGASTRODUODENOSCOPY (EGD);  Surgeon: Suzette Espy, MD;  Location: [facility];  Service: Endoscopy;  Laterality: N/A;  7:30   ORIF HUMERUS FRACTURE Left 05/29/2016   Procedure: LEFT OPEN REDUCTION INTERNAL FIXATION (ORIF) PROXIMAL HUMERUS FRACTURE;  Surgeon: Jasmine Mesi, MD;  Location: [facility];  Service: Orthopedics;  Laterality: Left;   ORIF WRIST FRACTURE Left 05/29/2016   Procedure: OPEN REDUCTION INTERNAL FIXATION (ORIF) LEFT WRIST FRACTURE;  Surgeon: Jasmine Mesi, MD;  Location: [facility] OR;  Service: Orthopedics;  Laterality:  Left;   RIGHT/LEFT HEART CATH AND CORONARY ANGIOGRAPHY N/A 04/17/2019   Procedure: RIGHT/LEFT HEART CATH AND CORONARY ANGIOGRAPHY;  Surgeon: Arty Binning, MD;  Location: [facility] IN[facility]AS[facility];  Service: Cardiovascular;  Laterality: N/A;   TUBAL LIGATION     UPPER GASTROINTESTINAL ENDOSCOPY  06/26/03   Dr. Essie Hefty sliding hiatal hernia with 8 mm tongue of gastric type mucosa at distal esophagus bile in the stomach.     Allergies  Allergen Reactions   Penicillins Hives and Swelling    Has patient had a PCN reaction causing IMMEDIATE RASH, FACIAL/TONGUE/THROAT SWELLING, SOB, OR LIGHTHEADEDNESS WITH HYPOTENSION:  #  #  #  YES  #  #  #  Has patient had a PCN reaction causing severe rash involving mucus membranes or skin necrosis: No Has patient had a PCN reaction that required hospitalization No Has patient had a PCN reaction occurring within the last 10 years: No If all of the above answers are "NO", then may proceed with Cephalosporin use.    Benadryl [Diphenhydramine Hcl] Hives   Msm [Methylsulfonylmethane] Hives   Nubain [Nalbuphine Hcl] Hives   Sulfa Antibiotics Hives   Codeine     UNSPECIFIED REACTION [IF AT ALL] REFUSES TO TAKE      Family History  Problem Relation Age of Onset   Hypertension Mother    Angina Mother    COPD Father    Heart failure Father    Cancer Sister    Dementia Sister    Cardiomyopathy Brother    Pneumonia Other    Migraines Sister    Colon cancer Neg Hx      Social History Melanie Bradley reports that she has been smoking cigarettes. She started smoking about 56 years ago. She has a 28.2 pack-year smoking history. She has never used smokeless tobacco. Melanie Bradley reports no history of alcohol use.   Review of Systems CONSTITUTIONAL: No weight loss, fever, chills, weakness or fatigue.  HEENT: Eyes: No visual loss, blurred vision, double vision or yellow sclerae.No hearing loss, sneezing, congestion, runny nose or sore throat.  SKIN: No  rash or itching.  CARDIOVASCULAR: per hpi RESPIRATORY: No shortness of breath, cough or sputum.  GASTROINTESTINAL: No anorexia, nausea, vomiting or diarrhea. No abdominal pain or blood.  GENITOURINARY: No burning on urination, no polyuria NEUROLOGICAL: No headache, dizziness, syncope, paralysis, ataxia, numbness or tingling in the extremities. No change in bowel or bladder control.  MUSCULOSKELETAL: No muscle, back pain, joint pain or stiffness.  LYMPHATICS: No enlarged nodes. No history of splenectomy.  PSYCHIATRIC: No history of depression or anxiety.  ENDOCRINOLOGIC: No reports of sweating, cold or heat intolerance. No polyuria or polydipsia.  Aaron Aas   Physical Examination Today's Vitals   02/28/23 1310  BP: 130/64  Pulse: 62  SpO2: 97%  Weight: 135 lb (61.2 kg)  Height: 5\' 1"  (1.549 m)   Body mass index is 25.51 kg/m.  Gen: resting comfortably, no acute distress HEENT: no scleral icterus, pupils equal round and reactive, no palptable cervical adenopathy,  CV: RRR, no m/rg, no jvd Resp: Clear to auscultation bilaterally GI: abdomen is soft, non-tender, non-distended, normal bowel sounds, no hepatosplenomegaly MSK: extremities are warm, no edema.  Skin: warm, no rash Neuro:  no focal deficits Psych: appropriate affect   Diagnostic Studies   04/2013 Lexiscan  MPI FINDINGS:   The patient's initial EKG revealed a narrow QRS. After she received   Lexiscan , the heart rate increased to 145 and the QRS widened to a   left bundle Lan Entsminger block. The increased rate persisted for several   minutes but then began to slow. When the heart rate was slower, P   waves could be seen. There were no flutter waves. The raw data   reveals no evidence of excess motion. Tomographic images with stress   reveal a small area of moderate decreased uptake in the septum. Rest   images reveal a moderate area of decreased uptake in the septum.   These findings are most consistent with left bundle Isobelle Tuckett  block.   Wall motion analysis reveals excellent motion with an ejection   fraction of 70%.   IMPRESSION:   This is a low risk scan. There is  no scar or ischemia. There is   decreased activity in the septum, consistent with left bundle Keta [place]   block. It is noted that the QRS was narrow at the start of the   study. However with Lexiscan , the patient developed tachycardia with   left bundle Yareth Macdonnell block. Assessment shows that this was most   probably sinus tachycardia which slowed over time.   10/2018 echo IMPRESSIONS      1. The left ventricle has moderately reduced systolic function, with an ejection fraction of 35-40%. The cavity size was normal. Severe focal basal septal hypertrophy.. Diastolic dysfunction, grade indeterminate. Left ventricular diffuse hypokinesis.  2. The right ventricle has normal systolic function. The cavity was normal. There is no increase in right ventricular wall thickness.  3. The aortic valve is tricuspid. Mild thickening of the aortic valve. Mild aortic annular calcification noted.  4. The mitral valve is grossly normal. Mild thickening of the mitral valve leaflet. Mitral valve regurgitation is mild to moderate by color flow Doppler.  5. The tricuspid valve is grossly normal.  6. The aorta is normal unless otherwise noted.   12/2018 30 day event monitor 30 day event monitor Min HR 57, Max HR 130, Avg HR 79 No symptoms reported Telemetry tracings show sinus rhythm with occasional PVCs. No significant arrhythmias   Jan 2021 echo IMPRESSIONS     1. Left ventricular ejection fraction, by visual estimation, is 35 to  40%. The left ventricle has moderately decreased function. There is no  left ventricular hypertrophy.   2. Left ventricular diastolic parameters are indeterminate.   3. The left ventricle demonstrates global hypokinesis.   4. Global right ventricle has normal systolic function.The right  ventricular size is normal. No increase in right  ventricular wall  thickness.   5. Left atrial size was normal.   6. Right atrial size was normal.   7. The mitral valve is normal in structure. Mild to moderate mitral valve  regurgitation. No evidence of mitral stenosis.   8. The tricuspid valve is normal in structure.   9. The aortic valve has an indeterminant number of cusps. Aortic valve  regurgitation is not visualized. No evidence of aortic valve sclerosis or  stenosis.  10. The pulmonic valve was not well visualized. Pulmonic valve  regurgitation is not visualized.  11. The inferior vena cava is normal in size with greater than 50%  respiratory variability, suggesting right atrial pressure of 3 mmHg.       04/2019 RHC/LHC Normal right heart pressures.  Cardiac output 3.7 L/min.  Cardiac index 2.5 L/min/m Normal left main Transapical LAD.  First diagonal contains segmental proximal 40% narrowing. Normal small ramus. Circumflex is normal. The right coronary demonstrates generalized minimal atherosclerosis.  No focal stenosis. Anterior and apical mild to moderate hypokinesis.  Estimated ejection fraction 35 to 40%.   RECOMMENDATIONS:   Guideline directed therapy for left ventricular systolic dysfunction in the setting of left bundle Amory Zbikowski block.  If no improvement in LV function with therapy, consider resync.    04/3021 echo    1. Left ventricular ejection fraction, by estimation, is 30 to 35%. The  left ventricle has moderately decreased function. The left ventricle  demonstrates global hypokinesis. There is mild left ventricular  hypertrophy. Left ventricular diastolic  parameters are indeterminate.   2. Right ventricular systolic function is normal. The right ventricular  size is normal. Tricuspid regurgitation signal is inadequate for assessing  PA pressure.   3. The mitral valve  is normal in structure. Trivial mitral valve  regurgitation.   4. The aortic valve is tricuspid. Aortic valve regurgitation is not   visualized. No aortic stenosis is present.   Assessment and Plan  Chronic systolic HF -recent limitations on medication regimen due to soft bp's and renal dysfunction, recurrent issues with hypovolemia.  - currently just on toprol  and farxiga . Recent retrial of aldactone  caused drop in Na and elevation in Cr. - continue current regimen, no recent symptoms - advanced dementia, poor ICD candidate           Laurann Pollock, M.D.

## 2023-02-28 NOTE — Patient Instructions (Signed)
 Medication Instructions:  Your physician recommends that you continue on your current medications as directed. Please refer to the Current Medication list given to you today.  *If you need a refill on your cardiac medications before your next appointment, please call your pharmacy*   Lab Work: None If you have labs (blood work) drawn today and your tests are completely normal, you will receive your results only by: MyChart Message (if you have MyChart) OR A paper copy in the mail If you have any lab test that is abnormal or we need to change your treatment, we will call you to review the results.   Testing/Procedures: None   Follow-Up: At [facility], you and your health needs are our priority.  As part of our continuing mission to provide you with exceptional heart care, we have created designated Provider Care Teams.  These Care Teams include your primary Cardiologist (physician) and Advanced Practice Providers (APPs -  Physician Assistants and Nurse Practitioners) who all work together to provide you with the care you need, when you need it.  We recommend signing up for the patient portal called "MyChart".  Sign up information is provided on this After Visit Summary.  MyChart is used to connect with patients for Virtual Visits (Telemedicine).  Patients are able to view lab/test results, encounter notes, upcoming appointments, etc.  Non-urgent messages can be sent to your provider as well.   To learn more about what you can do with MyChart, go to [email or web address].    Your next appointment:   6 month(s)  Provider:   You may see Dina Rich, MD or one of the following Advanced Practice Providers on your designated Care Team:   Randall An, PA-C  Jacolyn Reedy, [place]     Other Instructions

## 2023-04-22 ENCOUNTER — Other Ambulatory Visit: Payer: Self-pay | Admitting: Cardiology

## 2023-05-25 ENCOUNTER — Emergency Department (HOSPITAL_COMMUNITY)

## 2023-05-25 ENCOUNTER — Other Ambulatory Visit: Payer: Self-pay

## 2023-05-25 ENCOUNTER — Encounter (HOSPITAL_COMMUNITY): Payer: Self-pay | Admitting: Emergency Medicine

## 2023-05-25 ENCOUNTER — Encounter: Payer: Self-pay | Admitting: Emergency Medicine

## 2023-05-25 ENCOUNTER — Ambulatory Visit
Admission: EM | Admit: 2023-05-25 | Discharge: 2023-05-25 | Disposition: A | Discharge status: Home / Self Care | Attending: Internal Medicine | Admitting: Internal Medicine

## 2023-05-25 ENCOUNTER — Inpatient Hospital Stay (HOSPITAL_COMMUNITY)
Admission: EM | Admit: 2023-05-25 | Discharge: 2023-05-27 | DRG: 190 | Disposition: A | Discharge status: Home / Self Care | Attending: Family Medicine | Admitting: Family Medicine

## 2023-05-25 DIAGNOSIS — R911 Solitary pulmonary nodule: Secondary | ICD-10-CM | POA: Diagnosis present

## 2023-05-25 DIAGNOSIS — K589 Irritable bowel syndrome without diarrhea: Secondary | ICD-10-CM | POA: Diagnosis present

## 2023-05-25 DIAGNOSIS — Z1152 Encounter for screening for COVID-19: Secondary | ICD-10-CM | POA: Diagnosis not present

## 2023-05-25 DIAGNOSIS — F17210 Nicotine dependence, cigarettes, uncomplicated: Secondary | ICD-10-CM | POA: Diagnosis present

## 2023-05-25 DIAGNOSIS — Z888 Allergy status to other drugs, medicaments and biological substances status: Secondary | ICD-10-CM

## 2023-05-25 DIAGNOSIS — Z79899 Other long term (current) drug therapy: Secondary | ICD-10-CM

## 2023-05-25 DIAGNOSIS — I447 Left bundle-branch block, unspecified: Secondary | ICD-10-CM | POA: Diagnosis present

## 2023-05-25 DIAGNOSIS — J441 Chronic obstructive pulmonary disease with (acute) exacerbation: Principal | ICD-10-CM | POA: Diagnosis present

## 2023-05-25 DIAGNOSIS — Z809 Family history of malignant neoplasm, unspecified: Secondary | ICD-10-CM

## 2023-05-25 DIAGNOSIS — Z882 Allergy status to sulfonamides status: Secondary | ICD-10-CM

## 2023-05-25 DIAGNOSIS — R4182 Altered mental status, unspecified: Secondary | ICD-10-CM | POA: Diagnosis not present

## 2023-05-25 DIAGNOSIS — I4892 Unspecified atrial flutter: Secondary | ICD-10-CM | POA: Diagnosis present

## 2023-05-25 DIAGNOSIS — M4854XA Collapsed vertebra, not elsewhere classified, thoracic region, initial encounter for fracture: Secondary | ICD-10-CM | POA: Diagnosis present

## 2023-05-25 DIAGNOSIS — R400 Somnolence: Secondary | ICD-10-CM

## 2023-05-25 DIAGNOSIS — R062 Wheezing: Secondary | ICD-10-CM | POA: Diagnosis not present

## 2023-05-25 DIAGNOSIS — Z880 Allergy status to penicillin: Secondary | ICD-10-CM

## 2023-05-25 DIAGNOSIS — Z66 Do not resuscitate: Secondary | ICD-10-CM | POA: Diagnosis present

## 2023-05-25 DIAGNOSIS — J449 Chronic obstructive pulmonary disease, unspecified: Secondary | ICD-10-CM

## 2023-05-25 DIAGNOSIS — E039 Hypothyroidism, unspecified: Secondary | ICD-10-CM | POA: Diagnosis present

## 2023-05-25 DIAGNOSIS — Z8249 Family history of ischemic heart disease and other diseases of the circulatory system: Secondary | ICD-10-CM

## 2023-05-25 DIAGNOSIS — F0390 Unspecified dementia without behavioral disturbance: Secondary | ICD-10-CM | POA: Diagnosis present

## 2023-05-25 DIAGNOSIS — E278 Other specified disorders of adrenal gland: Secondary | ICD-10-CM | POA: Diagnosis present

## 2023-05-25 DIAGNOSIS — I4891 Unspecified atrial fibrillation: Secondary | ICD-10-CM | POA: Diagnosis present

## 2023-05-25 DIAGNOSIS — Z720 Tobacco use: Secondary | ICD-10-CM | POA: Diagnosis present

## 2023-05-25 DIAGNOSIS — J9602 Acute respiratory failure with hypercapnia: Secondary | ICD-10-CM | POA: Diagnosis present

## 2023-05-25 DIAGNOSIS — R7981 Abnormal blood-gas level: Secondary | ICD-10-CM

## 2023-05-25 DIAGNOSIS — Z825 Family history of asthma and other chronic lower respiratory diseases: Secondary | ICD-10-CM

## 2023-05-25 DIAGNOSIS — Z79890 Hormone replacement therapy (postmenopausal): Secondary | ICD-10-CM

## 2023-05-25 DIAGNOSIS — Z820 Family history of epilepsy and other diseases of the nervous system: Secondary | ICD-10-CM

## 2023-05-25 DIAGNOSIS — I130 Hypertensive heart and chronic kidney disease with heart failure and stage 1 through stage 4 chronic kidney disease, or unspecified chronic kidney disease: Secondary | ICD-10-CM | POA: Diagnosis present

## 2023-05-25 DIAGNOSIS — N1832 Chronic kidney disease, stage 3b: Secondary | ICD-10-CM | POA: Diagnosis present

## 2023-05-25 DIAGNOSIS — Z7982 Long term (current) use of aspirin: Secondary | ICD-10-CM | POA: Diagnosis not present

## 2023-05-25 DIAGNOSIS — J9601 Acute respiratory failure with hypoxia: Secondary | ICD-10-CM | POA: Diagnosis present

## 2023-05-25 DIAGNOSIS — I5022 Chronic systolic (congestive) heart failure: Secondary | ICD-10-CM | POA: Diagnosis present

## 2023-05-25 DIAGNOSIS — G4734 Idiopathic sleep related nonobstructive alveolar hypoventilation: Secondary | ICD-10-CM

## 2023-05-25 LAB — URINALYSIS, W/ REFLEX TO CULTURE (INFECTION SUSPECTED)
Bacteria, UA: NONE SEEN
Bilirubin Urine: NEGATIVE
Glucose, UA: >=500 mg/dL — AB
Hgb urine dipstick: NEGATIVE
Ketones, ur: NEGATIVE mg/dL
Leukocytes,Ua: NEGATIVE
Nitrite: NEGATIVE
Protein, ur: NEGATIVE mg/dL
Specific Gravity, Urine: 1.031 — ABNORMAL HIGH (ref 1.005–1.030)
pH: 7 (ref 5.0–8.0)

## 2023-05-25 LAB — COMPREHENSIVE METABOLIC PANEL WITH GFR
ALT: 13 U/L (ref 0–44)
AST: 18 U/L (ref 15–41)
Albumin: 3.4 g/dL — ABNORMAL LOW (ref 3.5–5.0)
Alkaline Phosphatase: 73 U/L (ref 38–126)
Anion gap: 11 (ref 5–15)
BUN: 24 mg/dL — ABNORMAL HIGH (ref 8–23)
CO2: 29 mmol/L (ref 22–32)
Calcium: 8.3 mg/dL — ABNORMAL LOW (ref 8.9–10.3)
Chloride: 97 mmol/L — ABNORMAL LOW (ref 98–111)
Creatinine, Ser: 1.47 mg/dL — ABNORMAL HIGH (ref 0.44–1.00)
GFR, Estimated: 38 mL/min — ABNORMAL LOW (ref >60)
Glucose, Bld: 94 mg/dL (ref 70–99)
Potassium: 3.9 mmol/L (ref 3.5–5.1)
Sodium: 137 mmol/L (ref 135–145)
Total Bilirubin: 0.4 mg/dL (ref 0.0–1.2)
Total Protein: 7 g/dL (ref 6.5–8.1)

## 2023-05-25 LAB — CBC WITH DIFFERENTIAL/PLATELET
Abs Immature Granulocytes: 0.04 10*3/uL (ref 0.00–0.07)
Basophils Absolute: 0.1 10*3/uL (ref 0.0–0.1)
Basophils Relative: 1 %
Eosinophils Absolute: 0.3 10*3/uL (ref 0.0–0.5)
Eosinophils Relative: 4 %
HCT: 45.6 % (ref 36.0–46.0)
Hemoglobin: 14.2 g/dL (ref 12.0–15.0)
Immature Granulocytes: 1 %
Lymphocytes Relative: 13 %
Lymphs Abs: 1 10*3/uL (ref 0.7–4.0)
MCH: 30.5 pg (ref 26.0–34.0)
MCHC: 31.1 g/dL (ref 30.0–36.0)
MCV: 98.1 fL (ref 80.0–100.0)
Monocytes Absolute: 1.2 10*3/uL — ABNORMAL HIGH (ref 0.1–1.0)
Monocytes Relative: 15 %
Neutro Abs: 5.5 10*3/uL (ref 1.7–7.7)
Neutrophils Relative %: 66 %
Platelets: 233 10*3/uL (ref 150–400)
RBC: 4.65 MIL/uL (ref 3.87–5.11)
RDW: 15.1 % (ref 11.5–15.5)
WBC: 8.1 10*3/uL (ref 4.0–10.5)
nRBC: 0 % (ref 0.0–0.2)

## 2023-05-25 LAB — BLOOD GAS, VENOUS
Acid-Base Excess: 16.5 mmol/L — ABNORMAL HIGH (ref 0.0–2.0)
Bicarbonate: 46 mmol/L — ABNORMAL HIGH (ref 20.0–28.0)
Drawn by: 7049
O2 Saturation: 45.5 %
Patient temperature: 37.9
pCO2, Ven: 79 mmHg (ref 44–60)
pH, Ven: 7.38 (ref 7.25–7.43)
pO2, Ven: <31 mmHg — CL (ref 32–45)

## 2023-05-25 LAB — RESP PANEL BY RT-PCR (RSV, FLU A&B, COVID)  RVPGX2
Influenza A by PCR: NEGATIVE
Influenza B by PCR: NEGATIVE
Resp Syncytial Virus by PCR: NEGATIVE
SARS Coronavirus 2 by RT PCR: NEGATIVE

## 2023-05-25 LAB — PROTIME-INR
INR: 0.9 (ref 0.8–1.2)
Prothrombin Time: 12.4 s (ref 11.4–15.2)

## 2023-05-25 LAB — LACTIC ACID, PLASMA
Lactic Acid, Venous: 0.9 mmol/L (ref 0.5–1.9)
Lactic Acid, Venous: 1.2 mmol/L (ref 0.5–1.9)

## 2023-05-25 LAB — BRAIN NATRIURETIC PEPTIDE: B Natriuretic Peptide: 136 pg/mL — ABNORMAL HIGH (ref 0.0–100.0)

## 2023-05-25 MED ORDER — MEMANTINE HCL 10 MG PO TABS
10.0000 mg | ORAL_TABLET | Freq: Two times a day (BID) | ORAL | Status: DC
  Administered 2023-05-25 – 2023-05-27 (×4): 10 mg via ORAL
  Filled 2023-05-25 (×4): qty 1

## 2023-05-25 MED ORDER — IOHEXOL 300 MG/ML  SOLN
60.0000 mL | Freq: Once | INTRAMUSCULAR | Status: AC | PRN
  Administered 2023-05-25: 60 mL via INTRAVENOUS

## 2023-05-25 MED ORDER — SODIUM CHLORIDE 0.9 % IV SOLN
500.0000 mg | Freq: Once | INTRAVENOUS | Status: AC
  Administered 2023-05-25: 500 mg via INTRAVENOUS
  Filled 2023-05-25: qty 5

## 2023-05-25 MED ORDER — ALBUTEROL SULFATE (2.5 MG/3ML) 0.083% IN NEBU: 2.5000 mg | INHALATION_SOLUTION | RESPIRATORY_TRACT | Status: DC | PRN

## 2023-05-25 MED ORDER — MAGNESIUM SULFATE 2 GM/50ML IV SOLN
2.0000 g | Freq: Once | INTRAVENOUS | Status: AC
  Administered 2023-05-25: 2 g via INTRAVENOUS
  Filled 2023-05-25: qty 50

## 2023-05-25 MED ORDER — IPRATROPIUM-ALBUTEROL 0.5-2.5 (3) MG/3ML IN SOLN
3.0000 mL | Freq: Four times a day (QID) | RESPIRATORY_TRACT | Status: DC
  Administered 2023-05-25 – 2023-05-27 (×6): 3 mL via RESPIRATORY_TRACT
  Filled 2023-05-25 (×6): qty 3

## 2023-05-25 MED ORDER — ONDANSETRON HCL 4 MG/2ML IJ SOLN
4.0000 mg | Freq: Four times a day (QID) | INTRAMUSCULAR | Status: DC | PRN
Start: 2023-05-25 — End: 2023-05-27

## 2023-05-25 MED ORDER — HYDRALAZINE HCL 20 MG/ML IJ SOLN: 5.0000 mg | INTRAMUSCULAR | Status: DC | PRN

## 2023-05-25 MED ORDER — METOPROLOL SUCCINATE ER 50 MG PO TB24
50.0000 mg | ORAL_TABLET | Freq: Every day | ORAL | Status: DC
  Administered 2023-05-26 – 2023-05-27 (×2): 50 mg via ORAL
  Filled 2023-05-25 (×2): qty 1

## 2023-05-25 MED ORDER — HEPARIN SODIUM (PORCINE) 5000 UNIT/ML IJ SOLN
5000.0000 [IU] | Freq: Three times a day (TID) | INTRAMUSCULAR | Status: DC
  Administered 2023-05-26 – 2023-05-27 (×4): 5000 [IU] via SUBCUTANEOUS
  Filled 2023-05-25 (×4): qty 1

## 2023-05-25 MED ORDER — PANTOPRAZOLE SODIUM 40 MG PO TBEC
40.0000 mg | DELAYED_RELEASE_TABLET | Freq: Every day | ORAL | Status: DC
  Administered 2023-05-25 – 2023-05-27 (×3): 40 mg via ORAL
  Filled 2023-05-25 (×3): qty 1

## 2023-05-25 MED ORDER — IPRATROPIUM-ALBUTEROL 0.5-2.5 (3) MG/3ML IN SOLN
3.0000 mL | Freq: Once | RESPIRATORY_TRACT | Status: AC
  Administered 2023-05-25: 3 mL via RESPIRATORY_TRACT

## 2023-05-25 MED ORDER — ESCITALOPRAM OXALATE 10 MG PO TABS
10.0000 mg | ORAL_TABLET | Freq: Every day | ORAL | Status: DC
  Administered 2023-05-26: 10 mg via ORAL
  Filled 2023-05-25: qty 1

## 2023-05-25 MED ORDER — ACETAMINOPHEN 325 MG PO TABS: 650.0000 mg | ORAL_TABLET | Freq: Four times a day (QID) | ORAL | Status: DC | PRN

## 2023-05-25 MED ORDER — METHYLPREDNISOLONE SODIUM SUCC 125 MG IJ SOLR: 125.0000 mg | Freq: Once | INTRAMUSCULAR | Status: DC

## 2023-05-25 MED ORDER — METOPROLOL SUCCINATE ER 50 MG PO TB24: 100.0000 mg | ORAL_TABLET | Freq: Every day | ORAL | Status: DC

## 2023-05-25 MED ORDER — METHYLPREDNISOLONE SODIUM SUCC 125 MG IJ SOLR
80.0000 mg | INTRAMUSCULAR | Status: DC
  Administered 2023-05-25: 80 mg via INTRAVENOUS
  Filled 2023-05-25: qty 2

## 2023-05-25 MED ORDER — SODIUM CHLORIDE 0.9 % IV SOLN
1.0000 g | INTRAVENOUS | Status: DC
  Administered 2023-05-25 – 2023-05-26 (×2): 1 g via INTRAVENOUS
  Filled 2023-05-25 (×2): qty 10

## 2023-05-25 MED ORDER — IPRATROPIUM-ALBUTEROL 0.5-2.5 (3) MG/3ML IN SOLN
3.0000 mL | Freq: Once | RESPIRATORY_TRACT | Status: AC
  Administered 2023-05-25: 3 mL via RESPIRATORY_TRACT
  Filled 2023-05-25: qty 3

## 2023-05-25 MED ORDER — ACETAMINOPHEN 650 MG RE SUPP
650.0000 mg | Freq: Four times a day (QID) | RECTAL | Status: DC | PRN
Start: 2023-05-25 — End: 2023-05-27

## 2023-05-25 MED ORDER — LEVOTHYROXINE SODIUM 88 MCG PO TABS
88.0000 ug | ORAL_TABLET | Freq: Every day | ORAL | Status: DC
  Administered 2023-05-26 – 2023-05-27 (×2): 88 ug via ORAL
  Filled 2023-05-25 (×2): qty 1

## 2023-05-25 MED ORDER — SODIUM CHLORIDE 0.9 % IV SOLN
2.0000 g | Freq: Once | INTRAVENOUS | Status: AC
  Administered 2023-05-25: 2 g via INTRAVENOUS
  Filled 2023-05-25: qty 20

## 2023-05-25 MED ORDER — ONDANSETRON HCL 4 MG PO TABS: 4.0000 mg | ORAL_TABLET | Freq: Four times a day (QID) | ORAL | Status: DC | PRN

## 2023-05-25 MED ORDER — DONEPEZIL HCL 5 MG PO TABS
10.0000 mg | ORAL_TABLET | Freq: Every day | ORAL | Status: DC
  Administered 2023-05-25 – 2023-05-26 (×2): 10 mg via ORAL
  Filled 2023-05-25 (×2): qty 2

## 2023-05-25 MED ORDER — ASPIRIN 81 MG PO TBEC
81.0000 mg | DELAYED_RELEASE_TABLET | Freq: Every day | ORAL | Status: DC
  Administered 2023-05-26 – 2023-05-27 (×2): 81 mg via ORAL
  Filled 2023-05-25 (×2): qty 1

## 2023-05-25 NOTE — ED Triage Notes (Signed)
 Pt came from UC due to shob and cough x 3 days. Alert and oriented to person. Mild labored breathing noted. Nad. Color wnl. Sats ra low 80s, received duo neb and solumedrol given at [facility]. Pt arrived on 2L 

## 2023-05-25 NOTE — ED Notes (Signed)
 Unable to obtain BP at this time as patient continues to move her arms while yelling.

## 2023-05-25 NOTE — Sepsis Progress Note (Signed)
 eLink is following this Code Sepsis.

## 2023-05-25 NOTE — ED Provider Notes (Signed)
 [facility]    CSN: [id number] Arrival date & time: 05/25/23  1555      History   Chief Complaint No chief complaint on file.   HPI Melanie Bradley is a 73 y.o. female.   Melanie Bradley is a 73 y.o. female with history of dementia presenting with husband who provides the history for chief complaint of cough, congestion, and altered mental status that started 24 hours ago.  Cough is frequent and dry per husband.  He is able to hear audible wheezing starting this morning.  Patient is a current everyday cigarette smoker, her husband denies history of chronic respiratory problems.  COPD is documented in the chart.  She does not use oxygen at home.  She became more sleepy and fatigued appearing yesterday evening and has been less active today than normal.  She has not had a fever at home and has been eating and drinking normally without nausea/vomiting/diarrhea.  He has not noticed any odor to her urine and she has not complained of any urinary symptoms.  He has not given any over-the-counter medications to try to help with symptoms prior to arrival.     Past Medical History:  Diagnosis Date   Atrial fibrillation (HCC)    Bloating    Complication of anesthesia     " hard to wake up sometimes "   Dementia (HCC)    Diverticula of colon    Epigastric burning sensation    GERD (gastroesophageal reflux disease)    Headache disorder 06/03/2014   Headache(784.0)    Helicobacter pylori gastritis 2004   Hemorrhoids 06/26/2003   Dr. Karilyn Cota tcs   HFrEF (heart failure with reduced ejection fraction) (HCC)    a. EF 35-40% in 10/2018 with cath in 04/2019 showing no significant CAD and cMRI in 07/2019 with no infiltrative disease, EF at 30-35% by most recent echo in 04/2021   Hiatal hernia 06/26/2003   Dr. 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 difficile enteritis 02/22/2018   Sepsis (HCC) 02/21/2018   Leukocytosis 02/21/2018   Vomiting and diarrhea 02/21/2018   Dementia without behavioral disturbance (HCC) 02/21/2018   Hypothyroidism 02/21/2018   Diarrhea 02/21/2018   Wrist fracture 06/26/2016   Humeral fracture 06/26/2016   Encounter for screening colonoscopy 12/31/2015   Memory difficulty 06/03/2014   Headache disorder 06/03/2014   Atrial flutter (HCC) 05/09/2013   COPD (chronic obstructive pulmonary disease) (HCC) 05/09/2013   Tobacco abuse 05/08/2013   LBBB (left bundle branch block)- intermittent 05/08/2013   Chronic diarrhea 05/11/2011  Chronic nausea 05/11/2011   Dyspepsia 02/24/2011   IBS (irritable bowel syndrome) 02/24/2011   Chest pain 12/05/2010   Hypertension 12/05/2010    Past Surgical History:  Procedure Laterality Date   APPENDECTOMY     CATARACT EXTRACTION W/PHACO Right 01/14/2021   Procedure: CATARACT EXTRACTION PHACO AND INTRAOCULAR LENS PLACEMENT (IOC);  Surgeon: Fabio Pierce, MD;  Location: [facility];  Service: Ophthalmology;  Laterality: Right;  CDE 8.87   CATARACT EXTRACTION W/PHACO Left 04/15/2021   Procedure: CATARACT EXTRACTION PHACO AND INTRAOCULAR LENS PLACEMENT (IOC);  Surgeon: Fabio Pierce,  MD;  Location: [facility];  Service: Ophthalmology;  Laterality: Left;  CDE: 8.19   CHOLECYSTECTOMY     COLONOSCOPY  06/26/03   Dr. Lane Hacker diverticula at the sigmoid colon with one of the transverse colon, normal terminal ileoscopy, small external hemorrhoids the   COLONOSCOPY N/A 01/20/2016   sigmoid diverticulosis, otherwise normal.    ESOPHAGOGASTRODUODENOSCOPY  03/15/2011   Dr. Ronni Rumble hiatal hernia, abnormal gastric mucosa of unclear significance. Gastric biopsies negative, no H. pylori.Procedure: ESOPHAGOGASTRODUODENOSCOPY (EGD);  Surgeon: Corbin Ade, MD;  Location: [facility];  Service: Endoscopy;  Laterality: N/A;  7:30   ORIF HUMERUS FRACTURE Left 05/29/2016   Procedure: LEFT OPEN REDUCTION INTERNAL FIXATION (ORIF) PROXIMAL HUMERUS FRACTURE;  Surgeon: Cammy Copa, MD;  Location: [facility];  Service: Orthopedics;  Laterality: Left;   ORIF WRIST FRACTURE Left 05/29/2016   Procedure: OPEN REDUCTION INTERNAL FIXATION (ORIF) LEFT WRIST FRACTURE;  Surgeon: Cammy Copa, MD;  Location: [facility];  Service: Orthopedics;  Laterality: Left;   RIGHT/LEFT HEART CATH AND CORONARY ANGIOGRAPHY N/A 04/17/2019   Procedure: RIGHT/LEFT HEART CATH AND CORONARY ANGIOGRAPHY;  Surgeon: Lyn Records, MD;  Location: [facility];  Service: Cardiovascular;  Laterality: N/A;   TUBAL LIGATION     UPPER GASTROINTESTINAL ENDOSCOPY  06/26/03   Dr. Damita Dunnings sliding hiatal hernia with 8 mm tongue of gastric type mucosa at distal esophagus bile in the stomach.    OB History   No obstetric history on file.      Home Medications    Prior to Admission medications   Medication Sig Start Date End Date Taking? Authorizing Provider  acetaminophen (TYLENOL) 500 MG tablet Take 1,000 mg by mouth every 6 (six) hours as needed (headaches.).    [provider]  aspirin EC 81 MG tablet Take 81 mg by mouth daily.    [provider]  Calcium Carb-Cholecalciferol (CALCIUM 600+D3  PO) Take 1 tablet by mouth daily with supper.    [provider]  clonazePAM (KLONOPIN) 0.25 MG disintegrating tablet Take 0.25 mg by mouth 3 (three) times daily as needed. 07/18/22   [provider]  Cyanocobalamin (B-12 PO) Take 6,000 mcg by mouth daily.    [provider]  donepezil (ARICEPT) 10 MG tablet Take 1 tablet (10 mg total) by mouth at bedtime. 05/03/21   Glean Salvo, NP  escitalopram (LEXAPRO) 10 MG tablet Take 10 mg by mouth daily with supper.  10/16/14   [provider]  FARXIGA 10 MG TABS tablet TAKE 1 TABLET BEFORE BREAKFAST. 04/23/23   Antoine Poche, MD  megestrol (MEGACE) 20 MG tablet Take 20 mg by mouth 2 (two) times daily. 01/20/20   [provider]  memantine (NAMENDA) 10 MG tablet Take 1 tablet (10 mg total) by mouth 2 (two) times daily. 05/03/21   Glean Salvo, NP  metoprolol succinate (TOPROL XL) 25 MG 24 hr tablet Take 1  tablet (25 mg total) by mouth daily. 08/13/20   Antoine Poche, MD  potassium chloride SA (KLOR-CON M) 20 MEQ tablet Take 40 mEq (2 tablets) by mouth for 3 days. 06/05/22   Antoine Poche, MD  SYNTHROID 88 MCG tablet Take 88 mcg by mouth See admin instructions. Take 1 tablet (88 mcg) by mouth on Sundays, Mondays, Tuesdays, Wednesdays,Thursdays, & Saturdays at bedtime, DOES NOT TAKE ANY Fridays. 12/15/17   [provider]    Family History Family History  Problem Relation Age of Onset   Hypertension Mother    Angina Mother    COPD Father    Heart failure Father    Cancer Sister    Dementia Sister    Cardiomyopathy Brother    Pneumonia Other    Migraines Sister    Colon cancer Neg Hx     Social History Social History   Tobacco Use   Smoking status: Every Day    Current packs/day: 0.50    Average packs/day: 0.5 packs/day for 56.7 years (28.4 ttl pk-yrs)    Types: Cigarettes    Start date: 09/10/1966   Smokeless tobacco: Never   Tobacco comments:    12/21/16 1 PPD  Vaping Use    Vaping status: Never Used  Substance Use Topics   Alcohol use: No    Alcohol/week: 0.0 standard drinks of alcohol   Drug use: No     Allergies   Penicillins, Benadryl [diphenhydramine hcl], Msm [methylsulfonylmethane], Nubain [nalbuphine hcl], Sulfa antibiotics, and Codeine   Review of Systems Review of Systems Per HPI  Physical Exam Triage Vital Signs ED Triage Vitals  Encounter Vitals Group     BP 05/25/23 1607 (!) 133/99     Systolic BP Percentile --      Diastolic BP Percentile --      Pulse Rate 05/25/23 1608 81     Resp 05/25/23 1607 (!) 24     Temp 05/25/23 1607 98.1 F (36.7 C)     Temp Source 05/25/23 1607 Oral     SpO2 05/25/23 1608 (!) 87 %     Weight --      Height --      Head Circumference --      Peak Flow --      Pain Score --      Pain Loc --      Pain Education --      Exclude from Growth Chart --    No data found.  Updated Vital Signs BP (!) 133/99 (BP Location: Left Arm)   Pulse 81   Temp 98.1 F (36.7 C) (Oral)   Resp (!) 24   SpO2 94%   Visual Acuity Right Eye Distance:   Left Eye Distance:   Bilateral Distance:    Right Eye Near:   Left Eye Near:    Bilateral Near:     Physical Exam Vitals and nursing note reviewed.  Constitutional:      Appearance: She is ill-appearing. She is not toxic-appearing.  HENT:     Head: Normocephalic and atraumatic.     Right Ear: Hearing and external ear normal.     Left Ear: Hearing and external ear normal.     Nose: Nose normal.     Mouth/Throat:     Lips: Pink.     Mouth: Mucous membranes are dry.  Eyes:     General: Lids are normal. Vision grossly intact. Gaze aligned appropriately.     Extraocular Movements: Extraocular  movements intact.     Conjunctiva/sclera: Conjunctivae normal.  Cardiovascular:     Rate and Rhythm: Normal rate and regular rhythm.     Heart sounds: Normal heart sounds, S1 normal and S2 normal.  Pulmonary:     Effort: Pulmonary effort is normal. No respiratory  distress.     Breath sounds: Normal air entry. Wheezing present. No rhonchi or rales.     Comments: Audible wheezing.  Harsh and dry cough frequently on exam.  Inspiratory and expiratory wheezing to all lung fields heard bilaterally. Chest:     Chest wall: No tenderness.  Musculoskeletal:     Cervical back: Neck supple.  Skin:    General: Skin is warm and dry.     Capillary Refill: Capillary refill takes less than 2 seconds.     Findings: No rash.  Neurological:     Mental Status: She is alert. Mental status is at baseline.     Cranial Nerves: No dysarthria or facial asymmetry.  Psychiatric:        Mood and Affect: Mood normal.        Speech: Speech normal.        Behavior: Behavior normal.        Thought Content: Thought content normal.        Judgment: Judgment normal.      UC Treatments / Results  Labs (all labs ordered are listed, but only abnormal results are displayed) Labs Reviewed - No data to display  EKG   Radiology No results found.  Procedures Procedures (including critical care time)  Medications Ordered in UC Medications  ipratropium-albuterol (DUONEB) 0.5-2.5 (3) MG/3ML nebulizer solution 3 mL (3 mLs Nebulization Given 05/25/23 1617)    Initial Impression / Assessment and Plan / UC Course  I have reviewed the triage vital signs and the nursing notes.  Pertinent labs & imaging results that were available during my care of the patient were reviewed by me and considered in my medical decision making (see chart for details).   1.  Altered mental status, low oxygen saturation Oxygen initially 78 to 81% on room air with good waveform. 2 L of oxygen placed via nasal cannula and oxygen increased to 88 to 91%.  DuoNeb given, oxygen saturation on room air improved to around 85% on room air after DuoNeb. She is somnolent and seated in wheelchair in position of comfort. Smiles intermittently and minimally interactive with provider. Recommend further workup and  evaluation in the emergency department setting to rule out emergent causes of altered mental status.  She is also likely suffering from COPD exacerbation.  EMS called, [facility] transported patient to the nearest emergency room in stable condition.   Final Clinical Impressions(s) / UC Diagnoses   Final diagnoses:  Low oxygen saturation  Altered mental status, unspecified altered mental status type  Wheezing   Discharge Instructions   None    ED Prescriptions   None    PDMP not reviewed this encounter.   Carlisle Beers, [place] 05/25/23 1657

## 2023-05-25 NOTE — H&P (Addendum)
 [facility] H&P   Patient Demographics:    Melanie Bradley, is a 73 y.o. female  MRN: [id number]   DOB - [birth year]  Admit Date - 05/25/2023  Outpatient Primary MD for the patient is Assunta Found, MD  Referring MD/NP/PA: Dr. Estell Harpin  Patient coming from: Home/urgent care  Chief Complaint  Patient presents with   Cough   Shortness of Breath      HPI:    Melanie Bradley  is a 73 y.o. female, with medical history of sCHF EF 35% %, dementia, COPD, CKD 3b hypertension, IBS, remote atrial flutter, hypothyroidism. - Patient resents initially to [place] urgent care due to complaints of shortness of breath, cough, she is demented cannot provide reliable history, apparently she has been having shortness of breath and cough, dry, with audible wheezing over last 24 hours, he is a chronic cigarette smoker but has cut drastically, per husband hears cigarettes here and there without smoking it, no fever, chills, nausea, vomiting or sick contact at home, in urgent care she was noted with oxygen saturation 80 to 81% with good waveform, started on 2 L oxygen and she will was transferred to [facility] ED. - In ED her CT chest with no evidence of pneumonia, significant for some atelectasis, stable pulmonary nodule and adrenal nodule, and stable compression fracture, EKG significant for old left bundle branch block, creatinine at baseline of 1.47, BNP mildly elevated at 136, lactic acid within normal limit, she has received Solu-Medrol by EMS, and respiratory status appears improved currently, [facility] hospitalist consulted to admit   Review of systems:    Patient is pleasantly demented, her review of system is unreliable.  With Past History of the following :    Past Medical History:  Diagnosis Date   Atrial fibrillation (HCC)    Bloating    Complication of anesthesia     " hard to wake up sometimes "    Dementia (HCC)    Diverticula of colon    Epigastric burning sensation    GERD (gastroesophageal reflux disease)    Headache disorder 06/03/2014   Headache(784.0)    Helicobacter pylori gastritis 2004   Hemorrhoids 06/26/2003   Dr. Karilyn Cota tcs   HFrEF (heart failure with reduced ejection fraction) (HCC)    a. EF 35-40% in 10/2018 with cath in 04/2019 showing no significant CAD and cMRI in 07/2019 with no infiltrative disease, EF at 30-35% by most recent echo in 04/2021   Hiatal hernia 06/26/2003   Dr. 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   Memory difficulty 06/03/2014   Shoulder fracture, left    Spastic colon    Wrist fracture       Past Surgical History:  Procedure Laterality Date   APPENDECTOMY     CATARACT EXTRACTION [facility] Right 01/14/2021  Procedure: CATARACT EXTRACTION PHACO AND INTRAOCULAR LENS PLACEMENT (IOC);  Surgeon: Fabio Pierce, MD;  Location: [facility] ORS;  Service: Ophthalmology;  Laterality: Right;  CDE 8.87   CATARACT EXTRACTION W/PHACO Left 04/15/2021   Procedure: CATARACT EXTRACTION PHACO AND INTRAOCULAR LENS PLACEMENT (IOC);  Surgeon: Fabio Pierce, MD;  Location: [facility] ORS;  Service: Ophthalmology;  Laterality: Left;  CDE: 8.19   CHOLECYSTECTOMY     COLONOSCOPY  06/26/03   Dr. Lane Hacker diverticula at the sigmoid colon with one of the transverse colon, normal terminal ileoscopy, small external hemorrhoids the   COLONOSCOPY N/A 01/20/2016   sigmoid diverticulosis, otherwise normal.    ESOPHAGOGASTRODUODENOSCOPY  03/15/2011   Dr. Ronni Rumble hiatal hernia, abnormal gastric mucosa of unclear significance. Gastric biopsies negative, no H. pylori.Procedure: ESOPHAGOGASTRODUODENOSCOPY (EGD);  Surgeon: Corbin Ade, MD;  Location: [facility] ENDO SUITE;  Service: Endoscopy;  Laterality: N/A;  7:30   ORIF HUMERUS FRACTURE Left 05/29/2016   Procedure: LEFT OPEN REDUCTION INTERNAL FIXATION (ORIF) PROXIMAL  HUMERUS FRACTURE;  Surgeon: Cammy Copa, MD;  Location: [facility] OR;  Service: Orthopedics;  Laterality: Left;   ORIF WRIST FRACTURE Left 05/29/2016   Procedure: OPEN REDUCTION INTERNAL FIXATION (ORIF) LEFT WRIST FRACTURE;  Surgeon: Cammy Copa, MD;  Location: [facility] OR;  Service: Orthopedics;  Laterality: Left;   RIGHT/LEFT HEART CATH AND CORONARY ANGIOGRAPHY N/A 04/17/2019   Procedure: RIGHT/LEFT HEART CATH AND CORONARY ANGIOGRAPHY;  Surgeon: Lyn Records, MD;  Location: [facility] INVASIVE CV LAB;  Service: Cardiovascular;  Laterality: N/A;   TUBAL LIGATION     UPPER GASTROINTESTINAL ENDOSCOPY  06/26/03   Dr. Damita Dunnings sliding hiatal hernia with 8 mm tongue of gastric type mucosa at distal esophagus bile in the stomach.      Social History:     Social History   Tobacco Use   Smoking status: Every Day    Current packs/day: 0.50    Average packs/day: 0.5 packs/day for 56.7 years (28.4 ttl pk-yrs)    Types: Cigarettes    Start date: 09/10/1966   Smokeless tobacco: Never   Tobacco comments:    12/21/16 1 PPD  Substance Use Topics   Alcohol use: No    Alcohol/week: 0.0 standard drinks of alcohol        Family History :     Family History  Problem Relation Age of Onset   Hypertension Mother    Angina Mother    COPD Father    Heart failure Father    Cancer Sister    Dementia Sister    Cardiomyopathy Brother    Pneumonia Other    Migraines Sister    Colon cancer Neg Hx       Home Medications:   Prior to Admission medications   Medication Sig Start Date End Date Taking? Authorizing Provider  metoprolol succinate (TOPROL-XL) 100 MG 24 hr tablet Take 100 mg by mouth daily. 02/26/23  Yes [provider]  acetaminophen (TYLENOL) 500 MG tablet Take 1,000 mg by mouth every 6 (six) hours as needed (headaches.).    [provider]  aspirin EC 81 MG tablet Take 81 mg by mouth daily.    [provider]  Calcium Carb-Cholecalciferol (CALCIUM 600+D3 PO)  Take 1 tablet by mouth daily with supper.    [provider]  clonazePAM (KLONOPIN) 0.25 MG disintegrating tablet Take 0.25 mg by mouth 3 (three) times daily as needed. 07/18/22   [provider]  Cyanocobalamin (B-12 PO) Take 6,000 mcg by mouth daily.    [provider]  donepezil (ARICEPT) 10 MG tablet Take 1 tablet (10 mg total) by mouth at bedtime. 05/03/21   Glean Salvo, NP  escitalopram (LEXAPRO) 10 MG tablet Take 10 mg by mouth daily with supper.  10/16/14   [provider]  FARXIGA 10 MG TABS tablet TAKE 1 TABLET BEFORE BREAKFAST. 04/23/23   Antoine Poche, MD  megestrol (MEGACE) 20 MG tablet Take 20 mg by mouth 2 (two) times daily. 01/20/20   [provider]  memantine (NAMENDA) 10 MG tablet Take 1 tablet (10 mg total) by mouth 2 (two) times daily. 05/03/21   Glean Salvo, NP  potassium chloride SA (KLOR-CON M) 20 MEQ tablet Take 40 mEq (2 tablets) by mouth for 3 days. 06/05/22   Antoine Poche, MD  SYNTHROID 88 MCG tablet Take 88 mcg by mouth See admin instructions. Take 1 tablet (88 mcg) by mouth on Sundays, Mondays, Tuesdays, Wednesdays,Thursdays, & Saturdays at bedtime, DOES NOT TAKE ANY Fridays. 12/15/17   [provider]     Allergies:     Allergies  Allergen Reactions   Penicillins Hives and Swelling    Immediate rash, facial/tongue/throat swelling, SOB, or lightheadedness with hypotension   Benadryl [Diphenhydramine Hcl] Hives   Msm [Methylsulfonylmethane] Hives   Nubain [Nalbuphine Hcl] Hives   Sulfa Antibiotics Hives   Codeine Other (See Comments)    Unspecified reaction  Pt refuses to take     Physical Exam:   Vitals  Blood pressure (!) 148/65, pulse 81, temperature 100.3 F (37.9 C), temperature source Axillary, resp. rate 15, SpO2 93%.   1. General Frail, elderly female, laying in bed, no apparent distress  2.,  Awake, oriented x 1, pleasantly demented  3. No F.N deficits, ALL C.Nerves Intact,  Strength 5/5 all 4 extremities, Sensation intact all 4 extremities, Plantars down going.  4. Ears and Eyes appear Normal, Conjunctivae clear, PERRLA. Moist Oral Mucosa.  5. Supple Neck, No JVD, No cervical lymphadenopathy appriciated, No Carotid Bruits.  6. Symmetrical Chest wall movement, diffuse wheezing bilaterally  7. RRR, No Gallops, Rubs or Murmurs, No Parasternal Heave.  8. Positive Bowel Sounds, Abdomen Soft, No tenderness, No organomegaly appriciated,No rebound -guarding or rigidity.  9.  No Cyanosis, Normal Skin Turgor, No Skin Rash or Bruise.  10. Good muscle tone,  joints appear normal , no effusions, Normal ROM.     Data Review:    CBC Recent Labs  Lab 05/25/23 1703  WBC 8.1  HGB 14.2  HCT 45.6  PLT 233  MCV 98.1  MCH 30.5  MCHC 31.1  RDW 15.1  LYMPHSABS 1.0  MONOABS 1.2*  EOSABS 0.3  BASOSABS 0.1   ------------------------------------------------------------------------------------------------------------------  Chemistries  Recent Labs  Lab 05/25/23 1703  NA 137  K 3.9  CL 97*  CO2 29  GLUCOSE 94  BUN 24*  CREATININE 1.47*  CALCIUM 8.3*  AST 18  ALT 13  ALKPHOS 73  BILITOT 0.4   ------------------------------------------------------------------------------------------------------------------ CrCl cannot be calculated (Unknown ideal weight.). ------------------------------------------------------------------------------------------------------------------ No results for input(s): "TSH", "T4TOTAL", "T3FREE", "THYROIDAB" in the last 72 hours.  Invalid input(s): "FREET3"  Coagulation profile Recent Labs  Lab 05/25/23 1703  INR 0.9   ------------------------------------------------------------------------------------------------------------------- No results for input(s): "DDIMER" in the last 72 hours. -------------------------------------------------------------------------------------------------------------------  Cardiac  Enzymes No results for input(s): "CKMB", "TROPONINI", "MYOGLOBIN" in the last 168 hours.  Invalid input(s): "CK" ------------------------------------------------------------------------------------------------------------------    Component Value Date/Time   BNP 136.0 (H) 05/25/2023 1801     ---------------------------------------------------------------------------------------------------------------  Urinalysis    Component Value Date/Time   COLORURINE YELLOW 01/11/2020 1113   APPEARANCEUR HAZY (A) 01/11/2020 1113   LABSPEC 1.017 01/11/2020 1113   PHURINE 5.0 01/11/2020 1113   GLUCOSEU NEGATIVE 01/11/2020 1113   HGBUR NEGATIVE 01/11/2020 1113   BILIRUBINUR NEGATIVE 01/11/2020 1113   KETONESUR NEGATIVE 01/11/2020 1113   PROTEINUR 30 (A) 01/11/2020 1113   NITRITE NEGATIVE 01/11/2020 1113   LEUKOCYTESUR NEGATIVE 01/11/2020 1113    ----------------------------------------------------------------------------------------------------------------   Imaging Results:    CT Chest W Contrast Result Date: 05/25/2023 CLINICAL DATA:  Abnormal xray - lung nodule, >= 1 cm. Shortness of breath, cough. EXAM: CT CHEST WITH CONTRAST TECHNIQUE: Multidetector CT imaging of the chest was performed during intravenous contrast administration. RADIATION DOSE REDUCTION: This exam was performed according to the departmental dose-optimization program which includes automated exposure control, adjustment of the mA and/or kV according to patient size and/or use of iterative reconstruction technique. CONTRAST:  60mL OMNIPAQUE IOHEXOL 300 MG/ML  SOLN COMPARISON:  Chest x-ray today.  CT abdomen and pelvis 02/21/2018. FINDINGS: Cardiovascular: Heart is normal size. Aorta is normal caliber. Aortic atherosclerosis. Mediastinum/Nodes: No mediastinal, hilar, or axillary adenopathy. Trachea and esophagus are unremarkable. Thyroid unremarkable. Moderate-sized hiatal hernia. Lungs/Pleura: Linear densities in the right lung  base most likely atelectasis. No confluent opacities otherwise. No effusions. Right lower lobe pulmonary nodule measures 4 mm on image 87. this is stable when compared to prior abdominal CT from 02/21/2018. Upper Abdomen: Left adrenal mass measures 4.1 x 3.8 cm compared to 3.6 x 3.4 cm on prior abdominal CT from 02/21/2018. Musculoskeletal: No acute findings. No acute bony abnormality. Severe T12 compression fracture stable since prior CT. IMPRESSION: No acute cardiopulmonary disease. Moderate-sized hiatal hernia. Right basilar atelectasis. 4 mm right lower lobe pulmonary nodule. This is stable when compared to prior abdominal CT compatible with benign nodule. Left adrenal mass slightly larger. 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 This could be confirmed with nonemergent outpatient MRI if felt clinically indicated. Electronically Signed   By: Charlett Nose M.D.   On: 05/25/2023 20:28   DG Chest Port 1 View Result Date: 05/25/2023 CLINICAL DATA:  Questionable sepsis EXAM: PORTABLE CHEST 1 VIEW COMPARISON:  01/11/2020 FINDINGS: Patient's chin covers the upper chest. Stable cardiac silhouette with ectatic aorta. No effusion, infiltrate or pneumothorax. Internal fixation of the proximal LEFT humerus. IMPRESSION: No acute cardiopulmonary process. Electronically Signed   By: Genevive Bi M.D.   On: 05/25/2023 18:52     EKG:  Vent. rate 80 BPM PR interval 178 ms QRS duration 134 ms QT/QTcB 441/509 ms P-R-T axes 47 -7 147 Sinus rhythm Left bundle branch block Confirmed by Bethann Berkshire  Assessment & Plan:    Principal Problem:   Acute exacerbation of chronic obstructive pulmonary disease (COPD) (HCC) Active Problems:   Tobacco abuse   Dementia without behavioral disturbance (HCC)   Chronic systolic heart failure (HCC)   Acute hypoxic, hypercapnic respiratory failure/COPD exacerbation -With known history of smoking, COPD, no evidence of pneumonia  on imaging, her workup significant for hypercapnia and hypoxia -Continue with IV Solu-Medrol. -When cough, productive, will start on IV Rocephin. -Follow on sputum cultures -Was encouraged with incentive spirometry and flutter valve - Continue with scheduled DuoNebs and as needed albuterol  Chronic systolic CHF -Recent echo in 2023 with EF 30 to 35% with global hypokinesis -Appears to be euvolemic, continue to monitor, resume Lasix when stable - Cardiology notes she is  poor AICD candidate given advanced dementia -He is on metoprolol and Marcelline Deist, for now we will hold [place] but can resume in 1 to 2 days. - Does appear she could not tolerate Aldactone as it did cause hyperkalemia and hyponatremia reviewing cardiology records.  Hypothyroidism -Continue Synthroid   Dementia without behavioral disturbance -Continue Aricept and Namenda   Tobacco abuse -She stopped smoking 5 months ago, she still holding cigarette here and there without smoking   T12 compression fracture -Evident on current imaging, this appears to be chronic, will start on calcium and vitamin D  Pulmonary nodule -Stable was compared to previous imaging, appears benign  Adrenal nodule -Left adrenal mass slightly larger, low growth over the last 6 years suggest this is most likely reflects an adenoma, this can be pursued on a nonemergent outpatient MRI if felt clinically indicated  LBBP - Chronic, appeared since 2021 - She denies any chest pain  CKD stage IIIb - Renal function at baseline, continue to monitor  DVT Prophylaxis Heparin  AM Labs Ordered, also please review Full Orders  Family Communication: Admission, patients condition and plan of care including tests being ordered have been discussed with the patient and sister at bedside and husband by phone who indicate understanding and agree with the plan and Code Status.  Code Status DNR, informed by husband  Likely DC to home  Consults called:  None  Admission status: Inpatient  Time spent in minutes : 70 minutes   Huey Bienenstock M.D on 05/25/2023 at 9:09 PM   [facility] - Office  [phone]

## 2023-05-25 NOTE — ED Notes (Signed)
 Patient is being discharged from the Urgent Care and sent to the Emergency Department via [facility] . Per NP, patient is in need of higher level of care due to hypoxia. Patient is aware and verbalizes understanding of plan of care.  Vitals:   05/25/23 1608 05/25/23 1615  BP:    Pulse: 81   Resp:    Temp:    SpO2: (!) 87% 94%

## 2023-05-25 NOTE — ED Notes (Signed)
 Still not able to successfully get a BP on pt as when the cuff gets tight, she yells, even with husband and family encouragement. May try again at later time.

## 2023-05-25 NOTE — ED Notes (Signed)
 This nurse gave report to [facility] of 300 at this time

## 2023-05-25 NOTE — ED Triage Notes (Signed)
 Cough and congestion and wheezing x 2 days.

## 2023-05-26 DIAGNOSIS — I5022 Chronic systolic (congestive) heart failure: Secondary | ICD-10-CM | POA: Diagnosis not present

## 2023-05-26 DIAGNOSIS — J441 Chronic obstructive pulmonary disease with (acute) exacerbation: Secondary | ICD-10-CM | POA: Diagnosis not present

## 2023-05-26 DIAGNOSIS — F0390 Unspecified dementia without behavioral disturbance: Secondary | ICD-10-CM | POA: Diagnosis not present

## 2023-05-26 LAB — BASIC METABOLIC PANEL WITH GFR
Anion gap: 11 (ref 5–15)
BUN: 20 mg/dL (ref 8–23)
CO2: 26 mmol/L (ref 22–32)
Calcium: 7.9 mg/dL — ABNORMAL LOW (ref 8.9–10.3)
Chloride: 101 mmol/L (ref 98–111)
Creatinine, Ser: 1.46 mg/dL — ABNORMAL HIGH (ref 0.44–1.00)
GFR, Estimated: 38 mL/min — ABNORMAL LOW (ref >60)
Glucose, Bld: 152 mg/dL — ABNORMAL HIGH (ref 70–99)
Potassium: 3.9 mmol/L (ref 3.5–5.1)
Sodium: 138 mmol/L (ref 135–145)

## 2023-05-26 LAB — CBC
HCT: 45.1 % (ref 36.0–46.0)
Hemoglobin: 14.3 g/dL (ref 12.0–15.0)
MCH: 30.9 pg (ref 26.0–34.0)
MCHC: 31.7 g/dL (ref 30.0–36.0)
MCV: 97.4 fL (ref 80.0–100.0)
Platelets: 233 10*3/uL (ref 150–400)
RBC: 4.63 MIL/uL (ref 3.87–5.11)
RDW: 14.9 % (ref 11.5–15.5)
WBC: 7.5 10*3/uL (ref 4.0–10.5)
nRBC: 0 % (ref 0.0–0.2)

## 2023-05-26 LAB — MRSA NEXT GEN BY PCR, NASAL: MRSA by PCR Next Gen: NOT DETECTED

## 2023-05-26 LAB — HIV ANTIBODY (ROUTINE TESTING W REFLEX): HIV Screen 4th Generation wRfx: NONREACTIVE

## 2023-05-26 MED ORDER — HALOPERIDOL LACTATE 5 MG/ML IJ SOLN
2.0000 mg | Freq: Four times a day (QID) | INTRAMUSCULAR | Status: DC | PRN
  Administered 2023-05-26: 2 mg via INTRAVENOUS
  Filled 2023-05-26: qty 1

## 2023-05-26 MED ORDER — CHLORHEXIDINE GLUCONATE CLOTH 2 % EX PADS
6.0000 | MEDICATED_PAD | Freq: Every day | CUTANEOUS | Status: DC
  Administered 2023-05-26 – 2023-05-27 (×2): 6 via TOPICAL

## 2023-05-26 MED ORDER — HYDRALAZINE HCL 20 MG/ML IJ SOLN: 10.0000 mg | INTRAMUSCULAR | Status: DC | PRN

## 2023-05-26 MED ORDER — CLONAZEPAM 0.25 MG PO TBDP
0.2500 mg | ORAL_TABLET | Freq: Three times a day (TID) | ORAL | Status: DC | PRN
  Administered 2023-05-26: 0.25 mg via ORAL
  Filled 2023-05-26: qty 1

## 2023-05-26 MED ORDER — LORAZEPAM 2 MG/ML IJ SOLN
0.5000 mg | Freq: Once | INTRAMUSCULAR | Status: AC
  Administered 2023-05-26: 0.5 mg via INTRAVENOUS
  Filled 2023-05-26: qty 1

## 2023-05-26 MED ORDER — METHYLPREDNISOLONE SODIUM SUCC 40 MG IJ SOLR
40.0000 mg | Freq: Every day | INTRAMUSCULAR | Status: DC
  Administered 2023-05-26: 40 mg via INTRAVENOUS
  Filled 2023-05-26: qty 1

## 2023-05-26 NOTE — Progress Notes (Signed)
 Took patient off of bipap to take PO medications. Patient is alert and talking to her family at bedside. Very interactive in conversation. Patient placed on 2L [place].

## 2023-05-26 NOTE — Progress Notes (Signed)
 PROGRESS NOTE   Melanie Bradley  ZOX:[id number] DOB: 1950/06/24 DOA: 05/25/2023 PCP: Minus Amel, MD   Chief Complaint  Patient presents with   Cough   Shortness of Breath   Level of care: Stepdown  Brief Admission History:  73 y.o. female, with medical history of sCHF EF 35%, dementia, COPD, CKD 3b hypertension, IBS, remote atrial flutter, hypothyroidism. - Patient presented initially to [facility] urgent care due to complaints of shortness of breath, cough, she is demented cannot provide reliable history, apparently she has been having shortness of breath and cough, dry, with audible wheezing over last 24 hours, he is a chronic cigarette smoker but has cut drastically, per husband hears cigarettes here and there without smoking it, no fever, chills, nausea, vomiting or sick contact at home, in urgent care she was noted with oxygen saturation 80 to 81% with good waveform, started on 2 L oxygen and she will was transferred to [facility] ED. - In ED her CT chest with no evidence of pneumonia, significant for some atelectasis, stable pulmonary nodule and adrenal nodule, and stable compression fracture, EKG significant for old left bundle branch block, creatinine at baseline of 1.47, BNP mildly elevated at 136, lactic acid within normal limit, she has received Solu-Medrol by EMS, and respiratory status appears improved currently, [facility]ist consulted to admit   Assessment and Plan:  Acute hypoxic, hypercapnic respiratory failure/COPD exacerbation -With known history of smoking, COPD, no evidence of pneumonia on imaging, her workup significant for hypercapnia and hypoxia -Continue with IV Solu-Medrol.  Get off bipap today of possible and change to PRN -wean oxygen as able; may need to go home on some supplemental oxygen temporarily.   -Continue IV Rocephin while inpatient -Follow on sputum cultures -Was encouraged with incentive spirometry and flutter valve - Continue with scheduled  DuoNebs and as needed albuterol   Chronic systolic CHF - well compensated -Recent echo in 2023 with EF 30 to 35% with global hypokinesis -Appears to be euvolemic, continue to monitor, resume Lasix - Cardiology notes she is poor AICD candidate given advanced dementia -metoprolol and Farxiga - Does appear she could not tolerate Aldactone as it did cause hyperkalemia and hyponatremia reviewing cardiology records.   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   Objective: Vitals:   05/26/23 1100 05/26/23 1150 05/26/23 1200 05/26/23 1300  BP: (!) 147/94  139/83   Pulse: 66  66   Resp: 16  14   Temp:  (!)  97.5 F (36.4 C)    TempSrc:  Axillary    SpO2: 99%  99% 98%  Weight:      Height:        Intake/Output Summary (Last 24 hours) at 05/26/2023 1321 Last data filed at 05/26/2023 2355 Gross per 24 hour  Intake 100 ml  Output --  Net 100 ml   Filed  Weights   05/25/23 2207 05/26/23 0558  Weight: 60.1 kg 61.8 kg   Examination:  General exam: advanced dementia, on bipap, Appears calm and comfortable  Respiratory system: good air movement, no rales heard, no increased work of breathing.  Cardiovascular system: normal S1 & S2 heard. No JVD, murmurs, rubs, gallops or clicks. No pedal edema. Gastrointestinal system: Abdomen is nondistended, soft and nontender. No organomegaly or masses felt. Normal bowel sounds heard. Central nervous system: Alert and oriented to person, recognizes husband. No focal neurological deficits. Extremities: Symmetric 5 x 5 power. Skin: No rashes, lesions or ulcers. Psychiatry: Judgement and insight appear diminished. Mood & affect appropriate.   Data Reviewed: I have personally reviewed following labs and imaging studies  CBC: Recent Labs  Lab 05/25/23 1703 05/26/23 0602  WBC 8.1 7.5  NEUTROABS 5.5  --   HGB 14.2 14.3  HCT 45.6 45.1  MCV 98.1 97.4  PLT 233 233    Basic Metabolic Panel: Recent Labs  Lab 05/25/23 1703 05/26/23 0602  NA 137 138  K 3.9 3.9  CL 97* 101  CO2 29 26  GLUCOSE 94 152*  BUN 24* 20  CREATININE 1.47* 1.46*  CALCIUM 8.3* 7.9*    CBG: No results for input(s): "GLUCAP" in the last 168 hours.  Recent Results (from the past 240 hours)  Blood Culture (routine x 2)     Status: None (Preliminary result)   Collection Time: 05/25/23  5:03 PM   Specimen: BLOOD  Result Value Ref Range Status   Specimen Description BLOOD BLOOD RIGHT HAND  Final   Special Requests   Final    BOTTLES DRAWN AEROBIC AND ANAEROBIC Blood Culture adequate volume Performed at [facility], [address] [place], [place] [address]    Culture PENDING  Incomplete   Report Status PENDING  Incomplete  Resp panel by RT-PCR (RSV, Flu A&B, Covid) Anterior Nasal Swab     Status: None   Collection Time: 05/25/23  5:09 PM   Specimen: Anterior Nasal Swab  Result Value Ref Range Status   SARS Coronavirus  2 by RT PCR NEGATIVE NEGATIVE Final    Comment: (NOTE) SARS-CoV-2 target nucleic acids are NOT DETECTED.  The SARS-CoV-2 RNA is generally detectable in upper respiratory specimens during the acute phase of infection. The lowest concentration of SARS-CoV-2 viral copies this assay can detect is 138 copies/mL. A negative result does not preclude SARS-Cov-2 infection and should not be used as the sole basis for treatment or other patient management decisions. A negative result may occur with  improper specimen collection/handling, submission of specimen other than nasopharyngeal swab, presence of viral mutation(s) within the areas targeted by this assay, and inadequate number of viral copies(<138 copies/mL). A negative result must be combined with clinical observations, patient history, and epidemiological information. The expected result is Negative.  Fact Sheet for Patients:  BloggerCourse.com  Fact Sheet for Healthcare Providers:  SeriousBroker.it  This test is no t yet approved or cleared by the [place]  FDA and  has been authorized for detection and/or diagnosis of SARS-CoV-2 by FDA under an Emergency Use Authorization (  EUA). This EUA will remain  in effect (meaning this test can be used) for the duration of the COVID-19 declaration under Section 564(b)(1) of the Act, 21 U.S.C.section 360bbb-3(b)(1), unless the authorization is terminated  or revoked sooner.       Influenza A by PCR NEGATIVE NEGATIVE Final   Influenza B by PCR NEGATIVE NEGATIVE Final    Comment: (NOTE) The Xpert Xpress SARS-CoV-2/FLU/RSV plus assay is intended as an aid in the diagnosis of influenza from Nasopharyngeal swab specimens and should not be used as a sole basis for treatment. Nasal washings and aspirates are unacceptable for Xpert Xpress SARS-CoV-2/FLU/RSV testing.  Fact Sheet for Patients: BloggerCourse.com  Fact  Sheet for Healthcare Providers: SeriousBroker.it  This test is not yet approved or cleared by the [place]  FDA and has been authorized for detection and/or diagnosis of SARS-CoV-2 by FDA under an Emergency Use Authorization (EUA). This EUA will remain in effect (meaning this test can be used) for the duration of the COVID-19 declaration under Section 564(b)(1) of the Act, 21 U.S.C. section 360bbb-3(b)(1), unless the authorization is terminated or revoked.     Resp Syncytial Virus by PCR NEGATIVE NEGATIVE Final    Comment: (NOTE) Fact Sheet for Patients: BloggerCourse.com  Fact Sheet for Healthcare Providers: SeriousBroker.it  This test is not yet approved or cleared by the [place]  FDA and has been authorized for detection and/or diagnosis of SARS-CoV-2 by FDA under an Emergency Use Authorization (EUA). This EUA will remain in effect (meaning this test can be used) for the duration of the COVID-19 declaration under Section 564(b)(1) of the Act, 21 U.S.C. section 360bbb-3(b)(1), unless the authorization is terminated or revoked.  Performed at [facility], [address] [place], [place] [address]   Blood Culture (routine x 2)     Status: None (Preliminary result)   Collection Time: 05/25/23  6:01 PM   Specimen: BLOOD  Result Value Ref Range Status   Specimen Description BLOOD BLOOD LEFT HAND  Final   Special Requests   Final    BOTTLES DRAWN AEROBIC ONLY Blood Culture results may not be optimal due to an inadequate volume of blood received in culture bottles   Culture   Final    NO GROWTH < 24 HOURS Performed at [facility], [address] [place], [place] [address]    Report Status PENDING  Incomplete  MRSA Next Gen by PCR, Nasal     Status: None   Collection Time: 05/26/23  5:01 AM   Specimen: Nasal Mucosa; Nasal Swab  Result Value Ref Range Status   MRSA by PCR Next Gen NOT DETECTED  NOT DETECTED Final    Comment: (NOTE) The GeneXpert MRSA Assay (FDA approved for NASAL specimens only), is one component of a comprehensive MRSA colonization surveillance program. It is not intended to diagnose MRSA infection nor to guide or monitor treatment for MRSA infections. Test performance is not FDA approved in patients less than 38 years old. Performed at [facility], [address], [place], [place] [address]      Radiology Studies: CT Chest W Contrast Result Date: 05/25/2023 CLINICAL DATA:  Abnormal xray - lung nodule, >= 1 cm. Shortness of breath, cough. EXAM: CT CHEST WITH CONTRAST TECHNIQUE: Multidetector CT imaging of the chest was performed during intravenous contrast administration. RADIATION DOSE REDUCTION: This exam was performed according to the departmental dose-optimization program which includes automated exposure control, adjustment of the mA and/or kV according to patient size and/or use of iterative reconstruction  technique. CONTRAST:  60mL OMNIPAQUE IOHEXOL 300 MG/ML  SOLN COMPARISON:  Chest x-ray today.  CT abdomen and pelvis 02/21/2018. FINDINGS: Cardiovascular: Heart is normal size. Aorta is normal caliber. Aortic atherosclerosis. Mediastinum/Nodes: No mediastinal, hilar, or axillary adenopathy. Trachea and esophagus are unremarkable. Thyroid unremarkable. Moderate-sized hiatal hernia. Lungs/Pleura: Linear densities in the right lung base most likely atelectasis. No confluent opacities otherwise. No effusions. Right lower lobe pulmonary nodule measures 4 mm on image 87. this is stable when compared to prior abdominal CT from 02/21/2018. Upper Abdomen: Left adrenal mass measures 4.1 x 3.8 cm compared to 3.6 x 3.4 cm on prior abdominal CT from 02/21/2018. Musculoskeletal: No acute findings. No acute bony abnormality. Severe T12 compression fracture stable since prior CT. IMPRESSION: No acute cardiopulmonary disease. Moderate-sized hiatal hernia. Right basilar atelectasis. 4  mm right lower lobe pulmonary nodule. This is stable when compared to prior abdominal CT compatible with benign nodule. Left adrenal mass slightly larger. 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 This could be confirmed with nonemergent outpatient MRI if felt clinically indicated. Electronically Signed   By: Janeece Mechanic M.D.   On: 05/25/2023 20:28   DG Chest Port 1 View Result Date: 05/25/2023 CLINICAL DATA:  Questionable sepsis EXAM: PORTABLE CHEST 1 VIEW COMPARISON:  01/11/2020 FINDINGS: Patient's chin covers the upper chest. Stable cardiac silhouette with ectatic aorta. No effusion, infiltrate or pneumothorax. Internal fixation of the proximal LEFT humerus. IMPRESSION: No acute cardiopulmonary process. Electronically Signed   By: Deboraha Fallow M.D.   On: 05/25/2023 18:52    Scheduled Meds:  aspirin EC  81 mg Oral Daily   Chlorhexidine Gluconate Cloth  6 each Topical Daily   donepezil  10 mg Oral QHS   escitalopram  10 mg Oral Q supper   heparin  5,000 Units Subcutaneous Q8H   ipratropium-albuterol  3 mL Nebulization QID   levothyroxine  88 mcg Oral Q0600   memantine  10 mg Oral BID   methylPREDNISolone (SOLU-MEDROL) injection  40 mg Intravenous Daily   metoprolol succinate  50 mg Oral Daily   pantoprazole  40 mg Oral Daily   Continuous Infusions:  cefTRIAXone (ROCEPHIN)  IV Stopped (05/26/23 0005)     LOS: 1 day   Time spent: 55 mins  Jaycen Vercher Lincoln Renshaw, MD How to contact the [facility] Attending or Consulting provider 7A - 7P or covering provider during after hours 7P -7A, for this patient?  Check the care team in [facility] and look for a) attending/consulting [facility] provider listed and b) the [facility] team listed Log into [email or web address] to find provider on call.  Locate the [facility] provider you are looking for under [facility] and page to a number that you can be directly reached. If you still have difficulty reaching the provider, please page the [facility]  (Director on Call) for the Hospitalists listed on amion for assistance.  05/26/2023, 1:21 PM

## 2023-05-26 NOTE — ED Provider Notes (Signed)
 [facility] Provider Note   CSN: [id number] Arrival date & time: 05/25/23  1645     History  Chief Complaint  Patient presents with   Cough   Shortness of Breath    Melanie Bradley is a 73 y.o. female.  patient with a history of dementia and COPD.  She presents with cough and shortness of breath.  She had hypoxia on room air in the 80s  The history is provided by the patient and medical records. No language interpreter was used.  Cough Cough characteristics:  Non-productive Sputum characteristics:  Nondescript Severity:  Moderate Onset quality:  Sudden Timing:  Constant Chronicity:  Recurrent Smoker: no   Relieved by:  Nothing Associated symptoms: shortness of breath   Associated symptoms: no chest pain, no eye discharge, no headaches and no rash   Shortness of Breath Associated symptoms: cough   Associated symptoms: no abdominal pain, no chest pain, no headaches and no rash        Home Medications Prior to Admission medications   Medication Sig Start Date End Date Taking? Authorizing Provider  metoprolol succinate (TOPROL-XL) 100 MG 24 hr tablet Take 100 mg by mouth daily. 02/26/23  Yes [provider]  acetaminophen (TYLENOL) 500 MG tablet Take 1,000 mg by mouth every 6 (six) hours as needed (headaches.).    [provider]  aspirin EC 81 MG tablet Take 81 mg by mouth daily.    [provider]  Calcium Carb-Cholecalciferol (CALCIUM 600+D3 PO) Take 1 tablet by mouth daily with supper.    [provider]  clonazePAM (KLONOPIN) 0.25 MG disintegrating tablet Take 0.25 mg by mouth 3 (three) times daily as needed. 07/18/22   [provider]  Cyanocobalamin (B-12 PO) Take 6,000 mcg by mouth daily.    [provider]  donepezil (ARICEPT) 10 MG tablet Take 1 tablet (10 mg total) by mouth at bedtime. 05/03/21   Wess Hammed, NP  escitalopram (LEXAPRO) 10 MG tablet Take 10 mg by mouth daily with supper.   10/16/14   [provider]  FARXIGA 10 MG TABS tablet TAKE 1 TABLET BEFORE BREAKFAST. 04/23/23   Laurann Pollock, MD  megestrol (MEGACE) 20 MG tablet Take 20 mg by mouth 2 (two) times daily. 01/20/20   [provider]  memantine (NAMENDA) 10 MG tablet Take 1 tablet (10 mg total) by mouth 2 (two) times daily. 05/03/21   Wess Hammed, NP  potassium chloride SA (KLOR-CON M) 20 MEQ tablet Take 40 mEq (2 tablets) by mouth for 3 days. 06/05/22   Laurann Pollock, MD  SYNTHROID 88 MCG tablet Take 88 mcg by mouth See admin instructions. Take 1 tablet (88 mcg) by mouth on Sundays, Mondays, Tuesdays, Wednesdays,Thursdays, & Saturdays at bedtime, DOES NOT TAKE ANY Fridays. 12/15/17   [provider]      Allergies    Penicillins, Benadryl [diphenhydramine hcl], Msm [methylsulfonylmethane], Nubain [nalbuphine hcl], Sulfa antibiotics, and Codeine    Review of Systems   Review of Systems  Constitutional:  Negative for appetite change and fatigue.  HENT:  Negative for congestion, ear discharge and sinus pressure.   Eyes:  Negative for discharge.  Respiratory:  Positive for cough and shortness of breath.   Cardiovascular:  Negative for chest pain.  Gastrointestinal:  Negative for abdominal pain and diarrhea.  Genitourinary:  Negative for frequency and hematuria.  Musculoskeletal:  Negative for back pain.  Skin:  Negative for rash.  Neurological:  Negative for seizures and headaches.  Psychiatric/Behavioral:  Negative for hallucinations.     Physical Exam Updated Vital Signs BP (!) 147/94   Pulse 66   Temp 98 F (36.7 C) (Oral)   Resp 16   Ht 5' (1.524 m)   Wt 61.8 kg   SpO2 99%   BMI 26.61 kg/m  Physical Exam Vitals and nursing note reviewed.  Constitutional:      Appearance: She is well-developed.  HENT:     Head: Normocephalic.     Nose: Nose normal.  Eyes:     General: No scleral icterus.    Conjunctiva/sclera: Conjunctivae normal.  Neck:     Thyroid: No  thyromegaly.  Cardiovascular:     Rate and Rhythm: Normal rate and regular rhythm.     Heart sounds: No murmur heard.    No friction rub. No gallop.  Pulmonary:     Breath sounds: No stridor. Wheezing present. No rales.  Chest:     Chest wall: No tenderness.  Abdominal:     General: There is no distension.     Tenderness: There is no abdominal tenderness. There is no rebound.  Musculoskeletal:        General: Normal range of motion.     Cervical back: Neck supple.  Lymphadenopathy:     Cervical: No cervical adenopathy.  Skin:    Findings: No erythema or rash.  Neurological:     Mental Status: She is alert and oriented to person, place, and time.     Motor: No abnormal muscle tone.     Coordination: Coordination normal.  Psychiatric:        Behavior: Behavior normal.     ED Results / Procedures / Treatments   Labs (all labs ordered are listed, but only abnormal results are displayed) Labs Reviewed  COMPREHENSIVE METABOLIC PANEL WITH GFR - Abnormal; Notable for the following components:      Result Value   Chloride 97 (*)    BUN 24 (*)    Creatinine, Ser 1.47 (*)    Calcium 8.3 (*)    Albumin 3.4 (*)    GFR, Estimated 38 (*)    All other components within normal limits  CBC WITH DIFFERENTIAL/PLATELET - Abnormal; Notable for the following components:   Monocytes Absolute 1.2 (*)    All other components within normal limits  URINALYSIS, W/ REFLEX TO CULTURE (INFECTION SUSPECTED) - Abnormal; Notable for the following components:   Color, Urine STRAW (*)    Specific Gravity, Urine 1.031 (*)    Glucose, UA >=500 (*)    All other components within normal limits  BRAIN NATRIURETIC PEPTIDE - Abnormal; Notable for the following components:   B Natriuretic Peptide 136.0 (*)    All other components within normal limits  BLOOD GAS, VENOUS - Abnormal; Notable for the following components:   pCO2, Ven 79 (*)    pO2, Ven <31 (*)    Bicarbonate 46.0 (*)    Acid-Base Excess 16.5  (*)    All other components within normal limits  BASIC METABOLIC PANEL WITH GFR - Abnormal; Notable for the following components:   Glucose, Bld 152 (*)    Creatinine, Ser 1.46 (*)    Calcium 7.9 (*)    GFR, Estimated 38 (*)    All other components within normal limits  RESP PANEL BY RT-PCR (RSV, FLU A&B, COVID)  RVPGX2  CULTURE, BLOOD (ROUTINE X 2)  CULTURE, BLOOD (ROUTINE X 2)  MRSA NEXT GEN BY  PCR, NASAL  EXPECTORATED SPUTUM ASSESSMENT W GRAM STAIN, RFLX TO RESP C  LACTIC ACID, PLASMA  LACTIC ACID, PLASMA  PROTIME-INR  CBC  HIV ANTIBODY (ROUTINE TESTING W REFLEX)    EKG EKG Interpretation Date/Time:  Friday May 25 2023 16:58:53 EDT Ventricular Rate:  80 PR Interval:  178 QRS Duration:  134 QT Interval:  441 QTC Calculation: 509 R Axis:   -7  Text Interpretation: Sinus rhythm Left bundle branch block Confirmed by Cheyenne Cotta [phone]) on 05/25/2023 8:26:09 PM  Radiology CT Chest W Contrast Result Date: 05/25/2023 CLINICAL DATA:  Abnormal xray - lung nodule, >= 1 cm. Shortness of breath, cough. EXAM: CT CHEST WITH CONTRAST TECHNIQUE: Multidetector CT imaging of the chest was performed during intravenous contrast administration. RADIATION DOSE REDUCTION: This exam was performed according to the departmental dose-optimization program which includes automated exposure control, adjustment of the mA and/or kV according to patient size and/or use of iterative reconstruction technique. CONTRAST:  60mL OMNIPAQUE IOHEXOL 300 MG/ML  SOLN COMPARISON:  Chest x-ray today.  CT abdomen and pelvis 02/21/2018. FINDINGS: Cardiovascular: Heart is normal size. Aorta is normal caliber. Aortic atherosclerosis. Mediastinum/Nodes: No mediastinal, hilar, or axillary adenopathy. Trachea and esophagus are unremarkable. Thyroid unremarkable. Moderate-sized hiatal hernia. Lungs/Pleura: Linear densities in the right lung base most likely atelectasis. No confluent opacities otherwise. No effusions. Right  lower lobe pulmonary nodule measures 4 mm on image 87. this is stable when compared to prior abdominal CT from 02/21/2018. Upper Abdomen: Left adrenal mass measures 4.1 x 3.8 cm compared to 3.6 x 3.4 cm on prior abdominal CT from 02/21/2018. Musculoskeletal: No acute findings. No acute bony abnormality. Severe T12 compression fracture stable since prior CT. IMPRESSION: No acute cardiopulmonary disease. Moderate-sized hiatal hernia. Right basilar atelectasis. 4 mm right lower lobe pulmonary nodule. This is stable when compared to prior abdominal CT compatible with benign nodule. Left adrenal mass slightly larger. 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 This could be confirmed with nonemergent outpatient MRI if felt clinically indicated. Electronically Signed   By: Janeece Mechanic M.D.   On: 05/25/2023 20:28   DG Chest Port 1 View Result Date: 05/25/2023 CLINICAL DATA:  Questionable sepsis EXAM: PORTABLE CHEST 1 VIEW COMPARISON:  01/11/2020 FINDINGS: Patient's chin covers the upper chest. Stable cardiac silhouette with ectatic aorta. No effusion, infiltrate or pneumothorax. Internal fixation of the proximal LEFT humerus. IMPRESSION: No acute cardiopulmonary process. Electronically Signed   By: Deboraha Fallow M.D.   On: 05/25/2023 18:52    Procedures Procedures    Medications Ordered in ED Medications  aspirin EC tablet 81 mg (81 mg Oral Given 05/26/23 1038)  donepezil (ARICEPT) tablet 10 mg (10 mg Oral Given 05/25/23 2333)  escitalopram (LEXAPRO) tablet 10 mg (has no administration in time range)  memantine (NAMENDA) tablet 10 mg (10 mg Oral Given 05/26/23 1038)  levothyroxine (SYNTHROID) tablet 88 mcg (88 mcg Oral Given 05/26/23 1038)  heparin injection 5,000 Units (5,000 Units Subcutaneous Given 05/26/23 0525)  acetaminophen (TYLENOL) tablet 650 mg (has no administration in time range)    Or  acetaminophen (TYLENOL) suppository 650 mg (has no administration  in time range)  ondansetron (ZOFRAN) tablet 4 mg (has no administration in time range)    Or  ondansetron (ZOFRAN) injection 4 mg (has no administration in time range)  hydrALAZINE (APRESOLINE) injection 5 mg (has no administration in time range)  cefTRIAXone (ROCEPHIN) 1 g in sodium chloride 0.9 % 100 mL  IVPB (0 g Intravenous Stopped 05/26/23 0005)  pantoprazole (PROTONIX) EC tablet 40 mg (40 mg Oral Given 05/26/23 1038)  ipratropium-albuterol (DUONEB) 0.5-2.5 (3) MG/3ML nebulizer solution 3 mL (3 mLs Nebulization Given 05/26/23 0818)  albuterol (PROVENTIL) (2.5 MG/3ML) 0.083% nebulizer solution 2.5 mg (has no administration in time range)  metoprolol succinate (TOPROL-XL) 24 hr tablet 50 mg (50 mg Oral Given 05/26/23 1038)  Chlorhexidine Gluconate Cloth 2 % PADS 6 each (6 each Topical Given 05/26/23 1041)  methylPREDNISolone sodium succinate (SOLU-MEDROL) 40 mg/mL injection 40 mg (has no administration in time range)  cefTRIAXone (ROCEPHIN) 2 g in sodium chloride 0.9 % 100 mL IVPB (0 g Intravenous Stopped 05/25/23 1750)  azithromycin (ZITHROMAX) 500 mg in sodium chloride 0.9 % 250 mL IVPB (0 mg Intravenous Stopped 05/25/23 1834)  magnesium sulfate IVPB 2 g 50 mL (0 g Intravenous Stopped 05/25/23 1834)  ipratropium-albuterol (DUONEB) 0.5-2.5 (3) MG/3ML nebulizer solution 3 mL (3 mLs Nebulization Given 05/25/23 1726)  iohexol (OMNIPAQUE) 300 MG/ML solution 60 mL (60 mLs Intravenous Contrast Given 05/25/23 1925)    ED Course/ Medical Decision Making/ A&P  CRITICAL CARE Performed by: Cheyenne Cotta Total critical care time: 45 minutes Critical care time was exclusive of separately billable procedures and treating other patients. Critical care was necessary to treat or prevent imminent or life-threatening deterioration. Critical care was time spent personally by me on the following activities: development of treatment plan with patient and/or surrogate as well as nursing, discussions with consultants,  evaluation of patient's response to treatment, examination of patient, obtaining history from patient or surrogate, ordering and performing treatments and interventions, ordering and review of laboratory studies, ordering and review of radiographic studies, pulse oximetry and re-evaluation of patient's condition.                                Medical Decision Making Amount and/or Complexity of Data Reviewed Labs: ordered. Radiology: ordered.  Risk Prescription drug management. Decision regarding hospitalization.   Patient will be admitted for COPD exacerbation and bronchitis       Final Clinical Impression(s) / ED Diagnoses Final diagnoses:  COPD exacerbation (HCC)    Rx / DC Orders ED Discharge Orders     None         Cheyenne Cotta, MD 05/26/23 1125

## 2023-05-26 NOTE — Hospital Course (Addendum)
 73 y.o. female, with medical history of sCHF EF 35%, dementia, COPD, CKD 3b hypertension, IBS, remote atrial flutter, hypothyroidism. - Patient presented initially to [facility] urgent care due to complaints of shortness of breath, cough, she is demented cannot provide reliable history, apparently she has been having shortness of breath and cough, dry, with audible wheezing over last 24 hours, he is a chronic cigarette smoker but has cut drastically, per husband hears cigarettes here and there without smoking it, no fever, chills, nausea, vomiting or sick contact at home, in urgent care she was noted with oxygen saturation 80 to 81% with good waveform, started on 2 L oxygen and she will was transferred to [facility] ED. - In ED her CT chest with no evidence of pneumonia, significant for some atelectasis, stable pulmonary nodule and adrenal nodule, and stable compression fracture, EKG significant for old left bundle branch block, creatinine at baseline of 1.47, BNP mildly elevated at 136, lactic acid within normal limit, she has received Solu-Medrol by EMS, and respiratory status appears improved currently, [facility] hospitalist consulted to admit

## 2023-05-26 NOTE — TOC Initial Note (Signed)
 Transition of Care [facility]) - Initial/Assessment Note    Patient Details  Name: Melanie Bradley MRN: [id number] Date of Birth: Jun 25, 1950  [facility]) CM/SW Contact:    Lynda Sands, RN Phone Number: 05/26/2023, 6:45 PM  Clinical Narrative:                     Barriers to Discharge: No Barriers Identified   Patient Goals and CMS Choice Patient states their goals for this hospitalization and ongoing recovery are:: Patient diagnosed with dementia 9 years ago. Patient spouse report patient plant to be discharge home.          Expected Discharge Plan and Services       Living arrangements for the past 2 months: Single Family Home                                      Prior Living Arrangements/Services Living arrangements for the past 2 months: Single Family Home Lives with:: Spouse Patient language and need for interpreter reviewed:: No Do you feel safe going back to the place where you live?: Yes        Care giver support system in place?: Yes (comment)   Criminal Activity/Legal Involvement Pertinent to Current Situation/Hospitalization: No - Comment as needed  Activities of Daily Living   ADL Screening (condition at time of admission) Independently performs ADLs?: Yes (appropriate for developmental age) Is the patient deaf or have difficulty hearing?: No Does the patient have difficulty seeing, even when wearing glasses/contacts?: No Does the patient have difficulty concentrating, remembering, or making decisions?: Yes  Permission Sought/Granted Permission sought to share information with : Case Manager                Emotional Assessment Appearance:: Appears stated age Attitude/Demeanor/Rapport: Aggressive (Verbally and/or physically) Affect (typically observed): Frustrated Orientation: : Oriented to Self      Admission diagnosis:  Acute exacerbation of chronic obstructive pulmonary disease (COPD) (HCC) [J44.1] COPD  exacerbation (HCC) [J44.1] Patient Active Problem List   Diagnosis Date Noted   Acute exacerbation of chronic obstructive pulmonary disease (COPD) (HCC) 05/25/2023   Chronic pain of right wrist 07/26/2022   Normocytic anemia 04/22/2020   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 difficile enteritis 02/22/2018   Sepsis (HCC) 02/21/2018   Leukocytosis 02/21/2018   Vomiting and diarrhea 02/21/2018   Dementia without behavioral disturbance (HCC) 02/21/2018   Hypothyroidism 02/21/2018   Diarrhea 02/21/2018   Wrist fracture 06/26/2016   Humeral fracture 06/26/2016   Encounter for screening colonoscopy 12/31/2015   Memory difficulty 06/03/2014   Headache disorder 06/03/2014   Atrial flutter (HCC) 05/09/2013   COPD (chronic obstructive pulmonary disease) (HCC) 05/09/2013   Tobacco abuse 05/08/2013   LBBB (left bundle branch block)- intermittent 05/08/2013   Chronic diarrhea 05/11/2011   Chronic nausea 05/11/2011   Dyspepsia 02/24/2011  IBS (irritable bowel syndrome) 02/24/2011   Chest pain 12/05/2010   Hypertension 12/05/2010   PCP:  Minus Amel, MD Pharmacy:   [facility] - [place], [place] - [address] [place] [address] Phone: [phone] Fax: [phone]     Social Drivers of Health (SDOH) Social History: SDOH Screenings   Food Insecurity: Patient Declined (05/26/2023)  Housing: Patient Declined (05/26/2023)  Transportation  Needs: Patient Declined (05/26/2023)  Utilities: Not At Risk (05/26/2023)  Financial Resource Strain: Unknown (02/21/2018)  Physical Activity: Unknown (02/21/2018)  Social Connections: Unknown (05/26/2023)  Stress: Unknown (02/21/2018)  Tobacco Use: High Risk (05/25/2023)  Health Literacy: Medium Risk (05/24/2020)   Received from [facility], [facility]   SDOH Interventions:     Readmission Risk Interventions     No data to display

## 2023-05-26 NOTE — Plan of Care (Signed)
  Problem: Education: Goal: Knowledge of General Education information will improve Description: Including pain rating scale, medication(s)/side effects and non-pharmacologic comfort measures Outcome: Progressing   Problem: Health Behavior/Discharge Planning: Goal: Ability to manage health-related needs will improve Outcome: Progressing   Problem: Clinical Measurements: Goal: Will remain free from infection Outcome: Progressing Goal: Diagnostic test results will improve Outcome: Progressing Goal: Respiratory complications will improve Outcome: Progressing   Problem: Activity: Goal: Risk for activity intolerance will decrease Outcome: Progressing   Problem: Nutrition: Goal: Adequate nutrition will be maintained Outcome: Progressing   Problem: Coping: Goal: Level of anxiety will decrease Outcome: Progressing   Problem: Elimination: Goal: Will not experience complications related to bowel motility Outcome: Progressing Goal: Will not experience complications related to urinary retention Outcome: Progressing   Problem: Pain Managment: Goal: General experience of comfort will improve and/or be controlled Outcome: Progressing   Problem: Safety: Goal: Ability to remain free from injury will improve Outcome: Progressing   Problem: Skin Integrity: Goal: Risk for impaired skin integrity will decrease Outcome: Progressing   Problem: Education: Goal: Knowledge of disease or condition will improve Outcome: Progressing Goal: Knowledge of the prescribed therapeutic regimen will improve Outcome: Progressing Goal: Individualized Educational Video(s) Outcome: Progressing

## 2023-05-26 NOTE — Progress Notes (Signed)
 Pt O2 sats dropped to the 70's while sleeping and quickly returned to 92%. Pt on 2L of O2 and after observing pts breathing patterns, patient had episodes of Apnea while sleeping. Pts O2 sats began to drop to the 50's and took anywhere from 30-60 seconds to return to the 70's and 80's. MD made aware, Bipap continuous orders placed, and also ABG and stepdown transfer orders placed. Report called to [place], RN

## 2023-05-26 NOTE — Progress Notes (Signed)
 On BIPAP,

## 2023-05-26 NOTE — TOC Initial Note (Deleted)
 Transition of Care [facility]) - Initial/Assessment Note    Patient Details  Name: Melanie Bradley MRN: [id number] Date of Birth: 1950/06/06  [facility]) CM/SW Contact:    Lynda Sands, RN Phone Number: 05/26/2023, 6:28 PM  Clinical Narrative:   CM met with patient, and spouse at bedside. Patient admitted with   Acute exacerbation of chronic obstructive pulmonary disease (COPD) (HCC). Patient spouse reported he did not know his wife has COPD. Patient's spouse reports he take care of his wife. Patient spouse reported patient has diagnosis of dementia for 9 years.   Patient needs assistance with adl's. Patient's family assist with her personal. Spouse reports he has a chair that chair that he rolls his wife, and a shower chair for showering. Patient spouse reports he plan to for patient to be discharge home.        Plan Home with total provided by Spouse and family. Barriers to Discharge: No Barriers Identified   Patient Goals and CMS Choice           Expected Discharge Plan and Services       Living arrangements for the past 2 months: Single Family Home                                      Prior Living Arrangements/Services Living arrangements for the past 2 months: Single Family Home Lives with:: Spouse Patient language and need for interpreter reviewed:: No Do you feel safe going back to the place where you live?: Yes        Care giver support system in place?: Yes (comment)   Criminal Activity/Legal Involvement Pertinent to Current Situation/Hospitalization: No - Comment as needed  Activities of Daily Living   ADL Screening (condition at time of admission) Independently performs ADLs?: Yes (appropriate for developmental age) Is the patient deaf or have difficulty hearing?: No Does the patient have difficulty seeing, even when wearing glasses/contacts?: No Does the patient have difficulty concentrating, remembering, or making decisions?:  Yes  Permission Sought/Granted Permission sought to share information with : Case Manager                Emotional Assessment Appearance:: Appears stated age Attitude/Demeanor/Rapport: Aggressive (Verbally and/or physically) Affect (typically observed): Frustrated Orientation: : Oriented to Self      Admission diagnosis:  Acute exacerbation of chronic obstructive pulmonary disease (COPD) (HCC) [J44.1] COPD exacerbation (HCC) [J44.1] Patient Active Problem List   Diagnosis Date Noted   Acute exacerbation of chronic obstructive pulmonary disease (COPD) (HCC) 05/25/2023   Chronic pain of right wrist 07/26/2022   Normocytic anemia 04/22/2020   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 difficile enteritis 02/22/2018   Sepsis (HCC) 02/21/2018   Leukocytosis 02/21/2018   Vomiting and diarrhea 02/21/2018   Dementia without behavioral disturbance (HCC) 02/21/2018   Hypothyroidism 02/21/2018   Diarrhea 02/21/2018   Wrist  fracture 06/26/2016   Humeral fracture 06/26/2016   Encounter for screening colonoscopy 12/31/2015   Memory difficulty 06/03/2014   Headache disorder 06/03/2014   Atrial flutter (HCC) 05/09/2013   COPD (chronic obstructive pulmonary disease) (HCC) 05/09/2013   Tobacco abuse 05/08/2013   LBBB (left bundle branch block)- intermittent 05/08/2013   Chronic diarrhea 05/11/2011   Chronic nausea 05/11/2011   Dyspepsia 02/24/2011   IBS (irritable bowel syndrome)  02/24/2011   Chest pain 12/05/2010   Hypertension 12/05/2010   PCP:  Minus Amel, MD Pharmacy:   [facility] - [place], [place] - [address] [place] [address] Phone: [phone] Fax: [phone]     Social Drivers of Health (SDOH) Social History: SDOH Screenings   Food Insecurity: Patient Declined (05/26/2023)  Housing: Patient Declined (05/26/2023)  Transportation Needs: Patient Declined (05/26/2023)  Utilities: Not At Risk (05/26/2023)  Financial Resource Strain: Unknown (02/21/2018)  Physical Activity: Unknown (02/21/2018)  Social Connections: Unknown (05/26/2023)  Stress: Unknown (02/21/2018)  Tobacco Use: High Risk (05/25/2023)  Health Literacy: Medium Risk (05/24/2020)   Received from [facility], [facility]   SDOH Interventions:     Readmission Risk Interventions     No data to display

## 2023-05-26 NOTE — Progress Notes (Signed)
   05/26/23 1822  TOC Assessment  TOC screening is complete Yes  Once discharged, how will the patient get to their discharge location? Family/Friend - Photographer  Expected Discharge Plan Home/Self Care  Barriers to Discharge No Barriers Identified  Patient states their goals for this hospitalization and ongoing recovery are: Patient diagnis with dementia. Patient spouse reports patient  goal is to discharge home.  Living arrangements for the past 2 months Single Family Home  Lives with: Spouse  Do you feel safe going back to the place where you live? Y  Permission sought to share information with  Case Manager  Patient language and need for interpreter reviewed: No  Criminal Activity/Legal Involvement Pertinent to Current Situation/Hospitalization No - Comment as needed  Care giver support system in place? Y  Appearance: Appears stated age  Attitude/Demeanor/Rapport Aggressive (Verbally and/or physically)  Affect (typically observed) Frustrated  Orientation:  Oriented to Self   CM met with patient and spouse at bedside.Transition of Care Department [facility]) has reviewed patient and no TOC needs have been identified at this time. We will continue to monitor patient advancement through interdisciplinary progression rounds. If new patient transition needs arise, please place a TOC consult.

## 2023-05-27 DIAGNOSIS — I5022 Chronic systolic (congestive) heart failure: Secondary | ICD-10-CM | POA: Diagnosis not present

## 2023-05-27 DIAGNOSIS — Z720 Tobacco use: Secondary | ICD-10-CM | POA: Diagnosis not present

## 2023-05-27 DIAGNOSIS — J441 Chronic obstructive pulmonary disease with (acute) exacerbation: Secondary | ICD-10-CM | POA: Diagnosis not present

## 2023-05-27 DIAGNOSIS — F0390 Unspecified dementia without behavioral disturbance: Secondary | ICD-10-CM | POA: Diagnosis not present

## 2023-05-27 MED ORDER — GUAIFENESIN ER 600 MG PO TB12: 600.0000 mg | ORAL_TABLET | Freq: Two times a day (BID) | ORAL | 0 refills | Status: DC

## 2023-05-27 MED ORDER — PREDNISONE 20 MG PO TABS: 40.0000 mg | ORAL_TABLET | Freq: Every day | ORAL | 0 refills | Status: DC

## 2023-05-27 MED ORDER — DEXTROMETHORPHAN POLISTIREX ER 30 MG/5ML PO SUER
30.0000 mg | Freq: Two times a day (BID) | ORAL | 0 refills | Status: AC | PRN
Start: 2023-05-27 — End: ?

## 2023-05-27 MED ORDER — DOXYCYCLINE HYCLATE 100 MG PO TABS: 100.0000 mg | ORAL_TABLET | Freq: Two times a day (BID) | ORAL | 0 refills | Status: AC

## 2023-05-27 MED ORDER — PANTOPRAZOLE SODIUM 40 MG PO TBEC: 40.0000 mg | DELAYED_RELEASE_TABLET | Freq: Every day | ORAL | 0 refills | Status: AC

## 2023-05-27 MED ORDER — IPRATROPIUM-ALBUTEROL 0.5-2.5 (3) MG/3ML IN SOLN: 3.0000 mL | Freq: Three times a day (TID) | RESPIRATORY_TRACT | Status: DC

## 2023-05-27 MED ORDER — METHYLPREDNISOLONE SODIUM SUCC 125 MG IJ SOLR: 60.0000 mg | Freq: Once | INTRAMUSCULAR | Status: DC

## 2023-05-27 MED ORDER — PREDNISONE 20 MG PO TABS: 40.0000 mg | ORAL_TABLET | Freq: Every day | ORAL | 0 refills | Status: AC

## 2023-05-27 MED ORDER — DOXYCYCLINE HYCLATE 100 MG PO TABS: 100.0000 mg | ORAL_TABLET | Freq: Two times a day (BID) | ORAL | 0 refills | Status: DC

## 2023-05-27 MED ORDER — GUAIFENESIN ER 600 MG PO TB12
600.0000 mg | ORAL_TABLET | Freq: Two times a day (BID) | ORAL | 0 refills | Status: AC
Start: 2023-05-27 — End: 2023-06-01

## 2023-05-27 MED ORDER — DEXTROMETHORPHAN POLISTIREX ER 30 MG/5ML PO SUER: 30.0000 mg | Freq: Two times a day (BID) | ORAL | 0 refills | Status: DC | PRN

## 2023-05-27 MED ORDER — IPRATROPIUM-ALBUTEROL 0.5-2.5 (3) MG/3ML IN SOLN: 3.0000 mL | Freq: Three times a day (TID) | RESPIRATORY_TRACT | 1 refills | Status: DC

## 2023-05-27 MED ORDER — IPRATROPIUM-ALBUTEROL 0.5-2.5 (3) MG/3ML IN SOLN: 3.0000 mL | Freq: Three times a day (TID) | RESPIRATORY_TRACT | 1 refills | Status: AC

## 2023-05-27 MED ORDER — PREDNISONE 20 MG PO TABS
60.0000 mg | ORAL_TABLET | Freq: Once | ORAL | Status: AC
  Administered 2023-05-27: 60 mg via ORAL
  Filled 2023-05-27: qty 3

## 2023-05-27 NOTE — Discharge Summary (Signed)
 Physician Discharge Summary  Melanie Bradley AOZ:[id number] DOB: [birth year] DOA: 05/25/2023  PCP: Minus Amel, MD  Admit date: 05/25/2023 Discharge date: 05/27/2023  Admitted From:  Home  Disposition:  Home   Recommendations for Outpatient Follow-up:  Follow up with PCP in 1 weeks Avoid all second hand smoke exposure  Home Health:  DME home nebulizer   Discharge Condition: STABLE   CODE STATUS: DNR DIET: resume home diet    Brief Hospitalization Summary: Please see all hospital notes, images, labs for full details of the hospitalization. Brief Admission History:  73 y.o. female, with medical history of sCHF EF 35%, dementia, COPD, CKD 3b hypertension, IBS, remote atrial flutter, hypothyroidism.  Patient presented initially to [facility] urgent care due to complaints of shortness of breath, cough, she is demented cannot provide reliable history, apparently she has been having shortness of breath and cough, dry, with audible wheezing over last 24 hours, he is a chronic cigarette smoker but has cut drastically, per husband hears cigarettes here and there without smoking it, no fever, chills, nausea, vomiting or sick contact at home, in urgent care she was noted with oxygen saturation 80 to 81% with good waveform, started on 2 L oxygen and she will was transferred to [facility] ED. - In ED her CT chest with no evidence of pneumonia, significant for some atelectasis, stable pulmonary nodule and adrenal nodule, and stable compression fracture, EKG significant for old left bundle branch block, creatinine at baseline of 1.47, BNP mildly elevated at 136, lactic acid within normal limit, she has received Solu-Medrol by EMS, and respiratory status appears improved currently, [facility] hospitalist consulted to admit   Assessment and Plan:   Acute hypoxic, hypercapnic respiratory failure/COPD exacerbation -With known history of smoking, COPD, no evidence of pneumonia on imaging, her workup significant  for hypercapnia and hypoxia, which was treated briefly with bipap therapy -pt was treated with IV Solu-Medrol in hospital with a good response.  -weaned to room air oxygen   -treated with IV Rocephin while inpatient -Was encouraged with incentive spirometry and flutter valve - Continue with scheduled DuoNebs and as needed albuterol - DC home on oral prednisone, doxycycline, mucinex, nebulizer with Duonebs   Chronic systolic CHF - well compensated -Recent echo in 2023 with EF 30 to 35% with global hypokinesis -Appears to be euvolemic, continue to monitor, resume Lasix - Cardiology notes she is poor AICD candidate given advanced dementia -metoprolol and Farxiga - Does appear she could not tolerate Aldactone as it did cause hyperkalemia and hyponatremia reviewing cardiology records.   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, this can be pursued on a nonemergent outpatient MRI if felt clinically indicated   LBBP - Chronic, appeared since 2021 - She denies any chest pain   CKD stage IIIb - Renal function at baseline, continue to monitor   Discharge Diagnoses:  Principal Problem:   Acute exacerbation of chronic obstructive pulmonary disease (COPD) (HCC) Active Problems:   Tobacco abuse   Dementia without behavioral disturbance (HCC)  Chronic systolic heart failure [facility])   Discharge Instructions: Discharge Instructions     Ambulatory referral to Sleep Studies    Complete by: As directed    For home use only DME Nebulizer machine   Complete by: As directed    Patient needs a nebulizer to treat with the following condition:  COPD (chronic obstructive pulmonary disease) (HCC) COPD with acute exacerbation (HCC)     Length of Need: Lifetime   Additional equipment included: Administration kit      Allergies as of 05/27/2023       Reactions   Penicillins Hives, Swelling   Immediate rash, facial/tongue/throat swelling, SOB, or lightheadedness with hypotension   Benadryl [diphenhydramine Hcl] Hives   Msm [methylsulfonylmethane] Hives   Nubain [nalbuphine Hcl] Hives   Sulfa Antibiotics Hives   Codeine Other (See Comments)   Unspecified reaction  Pt refuses to take        Medication List     TAKE these medications    acetaminophen 500 MG tablet Commonly known as: TYLENOL Take 1,000 mg by mouth every 6 (six) hours as needed (headaches.).   aspirin EC 81 MG tablet Take 81 mg by mouth daily.   B-12 PO Take 6,000 mcg by mouth daily.   CALCIUM 600+D3 PO Take 1 tablet by mouth in the morning and at bedtime.   dextromethorphan 30 MG/5ML liquid Commonly known as: Delsym Take 5 mLs (30 mg total) by mouth 2 (two) times daily as needed for cough.   donepezil 10 MG tablet Commonly known as: ARICEPT Take 1 tablet (10 mg total) by mouth at bedtime.   doxycycline 100 MG tablet Commonly known as: VIBRA-TABS Take 1 tablet (100 mg total) by mouth 2 (two) times daily for 5 days. Start taking on: May 28, 2023   escitalopram 10 MG tablet Commonly known as: LEXAPRO Take 10 mg by mouth daily with supper.   Farxiga 10 MG Tabs tablet Generic drug: dapagliflozin propanediol TAKE 1 TABLET BEFORE BREAKFAST. What changed: See the new instructions.   guaiFENesin 600 MG 12 hr tablet Commonly known as: Mucinex Take 1 tablet (600 mg total) by mouth 2 (two) times daily for 5 days.   ipratropium-albuterol 0.5-2.5 (3) MG/3ML Soln Commonly known  as: DUONEB Take 3 mLs by nebulization 3 (three) times daily.   memantine 10 MG tablet Commonly known as: NAMENDA Take 1 tablet (10 mg total) by mouth 2 (two) times daily.   metoprolol succinate 100 MG 24 hr tablet Commonly known as: TOPROL-XL Take 100 mg by mouth daily.   pantoprazole 40 MG tablet Commonly known as: PROTONIX Take 1 tablet (40 mg total) by mouth daily for 14 days. Start taking on: May 28, 2023   predniSONE 20 MG tablet Commonly known as: DELTASONE Take 2 tablets (40 mg total) by mouth daily with breakfast for 7 days. Start taking on: May 28, 2023   Synthroid 88 MCG tablet Generic drug: levothyroxine Take 88 mcg by mouth See admin instructions. Take 1 tablet (88 mcg) by mouth on Sundays, Mondays, Tuesdays, Wednesdays,Thursdays, & Saturdays at bedtime, DOES NOT TAKE ANY Fridays.               Durable Medical Equipment  (From admission, onward)           Start     Ordered   05/27/23 0918  For home use only DME Nebulizer machine  Once       Question Answer Comment  Patient needs a nebulizer to treat with  the following condition COPD (chronic obstructive pulmonary disease) (HCC)   Length of Need Lifetime   Additional equipment included Administration kit      05/27/23 0917   05/27/23 0000  For home use only DME Nebulizer machine       Question Answer Comment  Patient needs a nebulizer to treat with the following condition COPD (chronic obstructive pulmonary disease) (HCC)   Patient needs a nebulizer to treat with the following condition COPD with acute exacerbation (HCC)   Length of Need Lifetime   Additional equipment included Administration kit      05/27/23 7829            Follow-up Information     Minus Amel, MD. Schedule an appointment as soon as possible for a visit in 1 week(s).   Specialty: Family Medicine Why: Hospital Follow Up Contact information: [address] [place] [address] [phone]                 Allergies  Allergen Reactions   Penicillins Hives and Swelling    Immediate rash, facial/tongue/throat swelling, SOB, or lightheadedness with hypotension   Benadryl [Diphenhydramine Hcl] Hives   Msm [Methylsulfonylmethane] Hives   Nubain [Nalbuphine Hcl] Hives   Sulfa Antibiotics Hives   Codeine Other (See Comments)    Unspecified reaction  Pt refuses to take   Allergies as of 05/27/2023       Reactions   Penicillins Hives, Swelling   Immediate rash, facial/tongue/throat swelling, SOB, or lightheadedness with hypotension   Benadryl [diphenhydramine Hcl] Hives   Msm [methylsulfonylmethane] Hives   Nubain [nalbuphine Hcl] Hives   Sulfa Antibiotics Hives   Codeine Other (See Comments)   Unspecified reaction  Pt refuses to take        Medication List     TAKE these medications    acetaminophen 500 MG tablet Commonly known as: TYLENOL Take 1,000 mg by mouth every 6 (six) hours as needed (headaches.).   aspirin EC 81 MG tablet Take 81 mg by mouth daily.   B-12 PO Take 6,000 mcg by mouth daily.   CALCIUM 600+D3 PO Take 1 tablet by mouth in the morning and at bedtime.   dextromethorphan 30 MG/5ML liquid Commonly known as: Delsym Take 5 mLs (30 mg total) by mouth 2 (two) times daily as needed for cough.   donepezil 10 MG tablet Commonly known as: ARICEPT Take 1 tablet (10 mg total) by mouth at bedtime.   doxycycline 100 MG tablet Commonly known as: VIBRA-TABS Take 1 tablet (100 mg total) by mouth 2 (two) times daily for 5 days. Start taking on: May 28, 2023   escitalopram 10 MG tablet Commonly known as: LEXAPRO Take 10 mg by mouth daily with supper.   Farxiga 10 MG Tabs tablet Generic drug: dapagliflozin propanediol TAKE 1 TABLET BEFORE BREAKFAST. What changed: See the new instructions.   guaiFENesin 600 MG 12 hr tablet Commonly known as: Mucinex Take 1 tablet (600 mg total) by mouth 2 (two) times daily for 5 days.   ipratropium-albuterol  0.5-2.5 (3) MG/3ML Soln Commonly known as: DUONEB Take 3 mLs by nebulization 3 (three) times daily.   memantine 10 MG tablet Commonly known as: NAMENDA Take 1 tablet (10 mg total) by mouth 2 (two) times daily.   metoprolol succinate 100 MG 24 hr tablet Commonly known as: TOPROL-XL Take 100 mg by mouth daily.   pantoprazole 40 MG tablet Commonly known as: PROTONIX Take 1 tablet (40 mg total) by mouth daily  for 14 days. Start taking on: May 28, 2023   predniSONE 20 MG tablet Commonly known as: DELTASONE Take 2 tablets (40 mg total) by mouth daily with breakfast for 7 days. Start taking on: May 28, 2023   Synthroid 88 MCG tablet Generic drug: levothyroxine Take 88 mcg by mouth See admin instructions. Take 1 tablet (88 mcg) by mouth on Sundays, Mondays, Tuesdays, Wednesdays,Thursdays, & Saturdays at bedtime, DOES NOT TAKE ANY Fridays.               Durable Medical Equipment  (From admission, onward)           Start     Ordered   05/27/23 0918  For home use only DME Nebulizer machine  Once       Question Answer Comment  Patient needs a nebulizer to treat with the following condition COPD (chronic obstructive pulmonary disease) (HCC)   Length of Need Lifetime   Additional equipment included Administration kit      05/27/23 0917   05/27/23 0000  For home use only DME Nebulizer machine       Question Answer Comment  Patient needs a nebulizer to treat with the following condition COPD (chronic obstructive pulmonary disease) (HCC)   Patient needs a nebulizer to treat with the following condition COPD with acute exacerbation (HCC)   Length of Need Lifetime   Additional equipment included Administration kit      04 /13/25 0952            Procedures/Studies: CT Chest W Contrast Result Date: 05/25/2023 CLINICAL DATA:  Abnormal xray - lung nodule, >= 1 cm. Shortness of breath, cough. EXAM: CT CHEST WITH CONTRAST TECHNIQUE: Multidetector CT imaging of the chest  was performed during intravenous contrast administration. RADIATION DOSE REDUCTION: This exam was performed according to the departmental dose-optimization program which includes automated exposure control, adjustment of the mA and/or kV according to patient size and/or use of iterative reconstruction technique. CONTRAST:  60mL OMNIPAQUE IOHEXOL 300 MG/ML  SOLN COMPARISON:  Chest x-ray today.  CT abdomen and pelvis 02/21/2018. FINDINGS: Cardiovascular: Heart is normal size. Aorta is normal caliber. Aortic atherosclerosis. Mediastinum/Nodes: No mediastinal, hilar, or axillary adenopathy. Trachea and esophagus are unremarkable. Thyroid unremarkable. Moderate-sized hiatal hernia. Lungs/Pleura: Linear densities in the right lung base most likely atelectasis. No confluent opacities otherwise. No effusions. Right lower lobe pulmonary nodule measures 4 mm on image 87. this is stable when compared to prior abdominal CT from 02/21/2018. Upper Abdomen: Left adrenal mass measures 4.1 x 3.8 cm compared to 3.6 x 3.4 cm on prior abdominal CT from 02/21/2018. Musculoskeletal: No acute findings. No acute bony abnormality. Severe T12 compression fracture stable since prior CT. IMPRESSION: No acute cardiopulmonary disease. Moderate-sized hiatal hernia. Right basilar atelectasis. 4 mm right lower lobe pulmonary nodule. This is stable when compared to prior abdominal CT compatible with benign nodule. Left adrenal mass slightly larger. 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 This could be confirmed with nonemergent outpatient MRI if felt clinically indicated. Electronically Signed   By: Janeece Mechanic M.D.   On: 05/25/2023 20:28   DG Chest Port 1 View Result Date: 05/25/2023 CLINICAL DATA:  Questionable sepsis EXAM: PORTABLE CHEST 1 VIEW COMPARISON:  01/11/2020 FINDINGS: Patient's chin covers the upper chest. Stable cardiac silhouette with ectatic aorta. No effusion, infiltrate or  pneumothorax. Internal fixation of the proximal LEFT humerus. IMPRESSION: No acute cardiopulmonary process. Electronically Signed   By: Annette Barters  Dortha Gauss M.D.   On: 05/25/2023 18:52     Subjective: Pt off all supplemental oxygen, says she wants to go home with husband.   Discharge Exam: Vitals:   05/27/23 0818 05/27/23 0848  BP:  (!) 146/102  Pulse:  97  Resp:    Temp:    SpO2: 100% 95%   Vitals:   05/26/23 2316 05/27/23 0529 05/27/23 0818 05/27/23 0848  BP:  (!) 151/98  (!) 146/102  Pulse: 79 90  97  Resp: 20 20    Temp:  97.8 F (36.6 C)    TempSrc:  Oral    SpO2: 93% 97% 100% 95%  Weight:      Height:       General: Pt is alert, but with advanced dementia, awake, not in acute distress Cardiovascular: normal S1/S2 +, no rubs, no gallops Respiratory: good air movement bilaterally, rare expiratory wheezing, no rhonchi Abdominal: Soft, NT, ND, bowel sounds + Extremities: no edema, no cyanosis   The results of significant diagnostics from this hospitalization (including imaging, microbiology, ancillary and laboratory) are listed below for reference.     Microbiology: Recent Results (from the past 240 hours)  Blood Culture (routine x 2)     Status: None (Preliminary result)   Collection Time: 05/25/23  5:03 PM   Specimen: BLOOD  Result Value Ref Range Status   Specimen Description BLOOD BLOOD RIGHT HAND  Final   Special Requests   Final    BOTTLES DRAWN AEROBIC AND ANAEROBIC Blood Culture adequate volume Performed at [facility], [address] [place], [place] [address]    Culture PENDING  Incomplete   Report Status PENDING  Incomplete  Resp panel by RT-PCR (RSV, Flu A&B, Covid) Anterior Nasal Swab     Status: None   Collection Time: 05/25/23  5:09 PM   Specimen: Anterior Nasal Swab  Result Value Ref Range Status   SARS Coronavirus 2 by RT PCR NEGATIVE NEGATIVE Final    Comment: (NOTE) SARS-CoV-2 target nucleic acids are NOT DETECTED.  The SARS-CoV-2 RNA is  generally detectable in upper respiratory specimens during the acute phase of infection. The lowest concentration of SARS-CoV-2 viral copies this assay can detect is 138 copies/mL. A negative result does not preclude SARS-Cov-2 infection and should not be used as the sole basis for treatment or other patient management decisions. A negative result may occur with  improper specimen collection/handling, submission of specimen other than nasopharyngeal swab, presence of viral mutation(s) within the areas targeted by this assay, and inadequate number of viral copies(<138 copies/mL). A negative result must be combined with clinical observations, patient history, and epidemiological information. The expected result is Negative.  Fact Sheet for Patients:  BloggerCourse.com  Fact Sheet for Healthcare Providers:  SeriousBroker.it  This test is no t yet approved or cleared by the [place]  FDA and  has been authorized for detection and/or diagnosis of SARS-CoV-2 by FDA under an Emergency Use Authorization (EUA). This EUA will remain  in effect (meaning this test can be used) for the duration of the COVID-19 declaration under Section 564(b)(1) of the Act, 21 U.S.C.section 360bbb-3(b)(1), unless the authorization is terminated  or revoked sooner.       Influenza A by PCR NEGATIVE NEGATIVE Final   Influenza B by PCR NEGATIVE NEGATIVE Final    Comment: (NOTE) The Xpert Xpress SARS-CoV-2/FLU/RSV plus assay is intended as an aid in the diagnosis of influenza from Nasopharyngeal swab specimens and should not be used as a sole  basis for treatment. Nasal washings and aspirates are unacceptable for Xpert Xpress SARS-CoV-2/FLU/RSV testing.  Fact Sheet for Patients: BloggerCourse.com  Fact Sheet for Healthcare Providers: SeriousBroker.it  This test is not yet approved or cleared by the [place] FDA and has been authorized for detection and/or diagnosis of SARS-CoV-2 by FDA under an Emergency Use Authorization (EUA). This EUA will remain in effect (meaning this test can be used) for the duration of the COVID-19 declaration under Section 564(b)(1) of the Act, 21 U.S.C. section 360bbb-3(b)(1), unless the authorization is terminated or revoked.     Resp Syncytial Virus by PCR NEGATIVE NEGATIVE Final    Comment: (NOTE) Fact Sheet for Patients: BloggerCourse.com  Fact Sheet for Healthcare Providers: SeriousBroker.it  This test is not yet approved or cleared by the [place]  FDA and has been authorized for detection and/or diagnosis of SARS-CoV-2 by FDA under an Emergency Use Authorization (EUA). This EUA will remain in effect (meaning this test can be used) for the duration of the COVID-19 declaration under Section 564(b)(1) of the Act, 21 U.S.C. section 360bbb-3(b)(1), unless the authorization is terminated or revoked.  Performed at [facility], [address], [place], [place] [address]   Blood Culture (routine x 2)     Status: None (Preliminary result)   Collection Time: 05/25/23  6:01 PM   Specimen: BLOOD  Result Value Ref Range Status   Specimen Description BLOOD BLOOD LEFT HAND  Final   Special Requests   Final    BOTTLES DRAWN AEROBIC ONLY Blood Culture results may not be optimal due to an inadequate volume of blood received in culture bottles   Culture   Final    NO GROWTH 2 DAYS Performed at [facility], [address] [place], [place] [address]    Report Status PENDING  Incomplete  MRSA Next Gen by PCR, Nasal     Status: None   Collection Time: 05/26/23  5:01 AM   Specimen: Nasal Mucosa; Nasal Swab  Result Value Ref Range Status   MRSA by PCR Next Gen NOT DETECTED NOT DETECTED Final    Comment: (NOTE) The GeneXpert MRSA Assay (FDA approved for NASAL specimens only), is one component of a  comprehensive MRSA colonization surveillance program. It is not intended to diagnose MRSA infection nor to guide or monitor treatment for MRSA infections. Test performance is not FDA approved in patients less than 53 years old. Performed at [facility], [address], [place], [place] [address]      Labs: BNP (last 3 results) Recent Labs    05/25/23 1801  BNP 136.0*   Basic Metabolic Panel: Recent Labs  Lab 05/25/23 1703 05/26/23 0602  NA 137 138  K 3.9 3.9  CL 97* 101  CO2 29 26  GLUCOSE 94 152*  BUN 24* 20  CREATININE 1.47* 1.46*  CALCIUM 8.3* 7.9*   Liver Function Tests: Recent Labs  Lab 05/25/23 1703  AST 18  ALT 13  ALKPHOS 73  BILITOT 0.4  PROT 7.0  ALBUMIN 3.4*   No results for input(s): "LIPASE", "AMYLASE" in the last 168 hours. No results for input(s): "AMMONIA" in the last 168 hours. CBC: Recent Labs  Lab 05/25/23 1703 05/26/23 0602  WBC 8.1 7.5  NEUTROABS 5.5  --   HGB 14.2 14.3  HCT 45.6 45.1  MCV 98.1 97.4  PLT 233 233   Cardiac Enzymes: No results for input(s): "CKTOTAL", "CKMB", "CKMBINDEX", "TROPONINI" in the last 168 hours. BNP: Invalid input(s): "POCBNP" CBG: No  results for input(s): "GLUCAP" in the last 168 hours. D-Dimer No results for input(s): "DDIMER" in the last 72 hours. Hgb A1c No results for input(s): "HGBA1C" in the last 72 hours. Lipid Profile No results for input(s): "CHOL", "HDL", "LDLCALC", "TRIG", "CHOLHDL", "LDLDIRECT" in the last 72 hours. Thyroid function studies No results for input(s): "TSH", "T4TOTAL", "T3FREE", "THYROIDAB" in the last 72 hours.  Invalid input(s): "FREET3" Anemia work up No results for input(s): "VITAMINB12", "FOLATE", "FERRITIN", "TIBC", "IRON", "RETICCTPCT" in the last 72 hours. Urinalysis    Component Value Date/Time   COLORURINE STRAW (A) 05/25/2023 2115   APPEARANCEUR CLEAR 05/25/2023 2115   LABSPEC 1.031 (H) 05/25/2023 2115   PHURINE 7.0 05/25/2023 2115   GLUCOSEU >=500 (A)  05/25/2023 2115   HGBUR NEGATIVE 05/25/2023 2115   BILIRUBINUR NEGATIVE 05/25/2023 2115   KETONESUR NEGATIVE 05/25/2023 2115   PROTEINUR NEGATIVE 05/25/2023 2115   NITRITE NEGATIVE 05/25/2023 2115   LEUKOCYTESUR NEGATIVE 05/25/2023 2115   Sepsis Labs Recent Labs  Lab 05/25/23 1703 05/26/23 0602  WBC 8.1 7.5   Microbiology Recent Results (from the past 240 hours)  Blood Culture (routine x 2)     Status: None (Preliminary result)   Collection Time: 05/25/23  5:03 PM   Specimen: BLOOD  Result Value Ref Range Status   Specimen Description BLOOD BLOOD RIGHT HAND  Final   Special Requests   Final    BOTTLES DRAWN AEROBIC AND ANAEROBIC Blood Culture adequate volume Performed at [facility], [address], [place], [place] [address]    Culture PENDING  Incomplete   Report Status PENDING  Incomplete  Resp panel by RT-PCR (RSV, Flu A&B, Covid) Anterior Nasal Swab     Status: None   Collection Time: 05/25/23  5:09 PM   Specimen: Anterior Nasal Swab  Result Value Ref Range Status   SARS Coronavirus 2 by RT PCR NEGATIVE NEGATIVE Final    Comment: (NOTE) SARS-CoV-2 target nucleic acids are NOT DETECTED.  The SARS-CoV-2 RNA is generally detectable in upper respiratory specimens during the acute phase of infection. The lowest concentration of SARS-CoV-2 viral copies this assay can detect is 138 copies/mL. A negative result does not preclude SARS-Cov-2 infection and should not be used as the sole basis for treatment or other patient management decisions. A negative result may occur with  improper specimen collection/handling, submission of specimen other than nasopharyngeal swab, presence of viral mutation(s) within the areas targeted by this assay, and inadequate number of viral copies(<138 copies/mL). A negative result must be combined with clinical observations, patient history, and epidemiological information. The expected result is Negative.  Fact Sheet for Patients:   BloggerCourse.com  Fact Sheet for Healthcare Providers:  SeriousBroker.it  This test is no t yet approved or cleared by the [place]  FDA and  has been authorized for detection and/or diagnosis of SARS-CoV-2 by FDA under an Emergency Use Authorization (EUA). This EUA will remain  in effect (meaning this test can be used) for the duration of the COVID-19 declaration under Section 564(b)(1) of the Act, 21 U.S.C.section 360bbb-3(b)(1), unless the authorization is terminated  or revoked sooner.       Influenza A by PCR NEGATIVE NEGATIVE Final   Influenza B by PCR NEGATIVE NEGATIVE Final    Comment: (NOTE) The Xpert Xpress SARS-CoV-2/FLU/RSV plus assay is intended as an aid in the diagnosis of influenza from Nasopharyngeal swab specimens and should not be used as a sole basis for treatment. Nasal washings and aspirates are unacceptable for Xpert  Xpress SARS-CoV-2/FLU/RSV testing.  Fact Sheet for Patients: BloggerCourse.com  Fact Sheet for Healthcare Providers: SeriousBroker.it  This test is not yet approved or cleared by the [place]  FDA and has been authorized for detection and/or diagnosis of SARS-CoV-2 by FDA under an Emergency Use Authorization (EUA). This EUA will remain in effect (meaning this test can be used) for the duration of the COVID-19 declaration under Section 564(b)(1) of the Act, 21 U.S.C. section 360bbb-3(b)(1), unless the authorization is terminated or revoked.     Resp Syncytial Virus by PCR NEGATIVE NEGATIVE Final    Comment: (NOTE) Fact Sheet for Patients: BloggerCourse.com  Fact Sheet for Healthcare Providers: SeriousBroker.it  This test is not yet approved or cleared by the [place]  FDA and has been authorized for detection and/or diagnosis of SARS-CoV-2 by FDA under an Emergency Use  Authorization (EUA). This EUA will remain in effect (meaning this test can be used) for the duration of the COVID-19 declaration under Section 564(b)(1) of the Act, 21 U.S.C. section 360bbb-3(b)(1), unless the authorization is terminated or revoked.  Performed at [facility], [address] [place], [place] [address]   Blood Culture (routine x 2)     Status: None (Preliminary result)   Collection Time: 05/25/23  6:01 PM   Specimen: BLOOD  Result Value Ref Range Status   Specimen Description BLOOD BLOOD LEFT HAND  Final   Special Requests   Final    BOTTLES DRAWN AEROBIC ONLY Blood Culture results may not be optimal due to an inadequate volume of blood received in culture bottles   Culture   Final    NO GROWTH 2 DAYS Performed at [facility], [address] [place], [place] [address]    Report Status PENDING  Incomplete  MRSA Next Gen by PCR, Nasal     Status: None   Collection Time: 05/26/23  5:01 AM   Specimen: Nasal Mucosa; Nasal Swab  Result Value Ref Range Status   MRSA by PCR Next Gen NOT DETECTED NOT DETECTED Final    Comment: (NOTE) The GeneXpert MRSA Assay (FDA approved for NASAL specimens only), is one component of a comprehensive MRSA colonization surveillance program. It is not intended to diagnose MRSA infection nor to guide or monitor treatment for MRSA infections. Test performance is not FDA approved in patients less than 41 years old. Performed at [facility], [address], [place], [place] [address]    Time coordinating discharge: 34 mins  SIGNED:  Faustino Hook, MD  [facility] 05/27/2023, 10:14 AM How to contact the [facility] Attending or Consulting provider 7A - 7P or covering provider during after hours 7P -7A, for this patient?  Check the care team in [facility] and look for a) attending/consulting TR[facility] provider listed and b) the TRH team listed Log into [email or web address] and use [place]'s universal password to access. If you do not have the password,  please contact the hospital operator. Locate the TR[facility] provider you are looking for under [facility] and page to a number that you can be directly reached. If you still have difficulty reaching the provider, please page the [facility] (Director on Call) for the Hospitalists listed on amion for assistance.

## 2023-05-27 NOTE — Progress Notes (Signed)
   05/27/23 1000  TOC Discharge Assessment  Once discharged, how will the patient get to their discharge location? Family/Friend - Partnered Transport  Has discharge transport plan been identified? Yes  DME Arranged Nebulizer machine  DME Agency [facility]  Date DME Agency Contacted 05/27/23  Time DME Agency Contacted 1000  Representative spoke with at DME Agency [facility]   CM met with patient's spouse at bedside. CM explained the plan to purchase nebulizer. Mr. Meriweather reported he will purchase nebulizer at [facility].

## 2023-05-27 NOTE — Discharge Instructions (Signed)
 IMPORTANT INFORMATION: PAY CLOSE ATTENTION  ? ?PHYSICIAN DISCHARGE INSTRUCTIONS ? ?Follow with Primary care provider  Assunta Found, MD  and other consultants as instructed by your Hospitalist Physician ? ?SEEK MEDICAL CARE OR RETURN TO EMERGENCY ROOM IF SYMPTOMS COME BACK, WORSEN OR NEW PROBLEM DEVELOPS  ? ?Please note: ?You were cared for by a hospitalist during your hospital stay. Every effort will be made to forward records to your primary care provider.  You can request that your primary care provider send for your hospital records if they have not received them.  Once you are discharged, your primary care physician will handle any further medical issues. Please note that NO REFILLS for any discharge medications will be authorized once you are discharged, as it is imperative that you return to your primary care physician (or establish a relationship with a primary care physician if you do not have one) for your post hospital discharge needs so that they can reassess your need for medications and monitor your lab values. ? ?Please get a complete blood count and chemistry panel checked by your Primary MD at your next visit, and again as instructed by your Primary MD. ? ?Get Medicines reviewed and adjusted: ?Please take all your medications with you for your next visit with your Primary MD ? ?Laboratory/radiological data: ?Please request your Primary MD to go over all hospital tests and procedure/radiological results at the follow up, please ask your primary care provider to get all Hospital records sent to his/her office. ? ?In some cases, they will be blood work, cultures and biopsy results pending at the time of your discharge. Please request that your primary care provider follow up on these results. ? ?If you are diabetic, please bring your blood sugar readings with you to your follow up appointment with primary care.   ? ?Please call and make your follow up appointments as soon as possible.   ? ?Also Note  the following: ?If you experience worsening of your admission symptoms, develop shortness of breath, life threatening emergency, suicidal or homicidal thoughts you must seek medical attention immediately by calling 911 or calling your MD immediately  if symptoms less severe. ? ?You must read complete instructions/literature along with all the possible adverse reactions/side effects for all the Medicines you take and that have been prescribed to you. Take any new Medicines after you have completely understood and accpet all the possible adverse reactions/side effects.  ? ?Do not drive when taking Pain medications or sleeping medications (Benzodiazepines) ? ?Do not take more than prescribed Pain, Sleep and Anxiety Medications. It is not advisable to combine anxiety,sleep and pain medications without talking with your primary care practitioner ? ?Special Instructions: If you have smoked or chewed Tobacco  in the last 2 yrs please stop smoking, stop any regular Alcohol  and or any Recreational drug use. ? ?Wear Seat belts while driving.  Do not drive if taking any narcotic, mind altering or controlled substances or recreational drugs or alcohol.  ? ? ? ? ? ?

## 2023-05-30 LAB — CULTURE, BLOOD (ROUTINE X 2)
Culture: NO GROWTH
Culture: NO GROWTH
Special Requests: ADEQUATE

## 2023-09-05 NOTE — Progress Notes (Deleted)
 Cardiology Office Note    Date:  09/05/2023  ID:  Melanie Bradley, DOB August 11, 1950, MRN [id number] Cardiologist: Alvan Carrier, MD { :  History of Present Illness:    Melanie Bradley is a 73 y.o. female  with past medical history of chronic HFrEF (EF 35-40% in 10/2018 with cath in 04/2019 showing no significant CAD and cMRI in 07/2019 with no infiltrative disease, EF at 30-35% by most recent echo in 04/2021), HTN, LBBB, Stage 3 CKD and dementia who presents who presents to the office today for 67-month follow-up.  She was last examined by Dr. Alvan in 02/2023 and denied any recent chest pain or dyspnea on exertion at that time.  Medical therapy for her cardiomyopathy had been limited given renal dysfunction and hypotension. Aldactone  had recently been stopped given worsening hyponatremia. She was also felt to be a poor candidate for ICD given her dementia. She was continued on ASA 81 mg daily, Farxiga  10 mg daily and Toprol -XL 25 mg daily.  In the interim, she was admitted to [facility] in 05/2023 for acute hypoxic respiratory failure in the setting of a COPD exacerbation. She responded well to IV steroids, IV antibiotics and scheduled nebulizers. It appears that her Toprol -XL was listed as 100 mg daily at discharge instead of 25 mg daily.  ROS: ***  Studies Reviewed:   EKG: EKG is*** ordered today and demonstrates ***   EKG Interpretation Date/Time:    Ventricular Rate:    PR Interval:    QRS Duration:    QT Interval:    QTC Calculation:   R Axis:      Text Interpretation:         R/LHC: 04/2019 Normal right heart pressures.  Cardiac output 3.7 L/min.  Cardiac index 2.5 L/min/m Normal left main Transapical LAD.  First diagonal contains segmental proximal 40% narrowing. Normal small ramus. Circumflex is normal. The right coronary demonstrates generalized minimal atherosclerosis.  No focal stenosis. Anterior and apical mild to moderate hypokinesis.  Estimated ejection  fraction 35 to 40%.   RECOMMENDATIONS:   Guideline directed therapy for left ventricular systolic dysfunction in the setting of left bundle branch block.  If no improvement in LV function with therapy, consider resync.  Echocardiogram: 04/2021 IMPRESSIONS     1. Left ventricular ejection fraction, by estimation, is 30 to 35%. The  left ventricle has moderately decreased function. The left ventricle  demonstrates global hypokinesis. There is mild left ventricular  hypertrophy. Left ventricular diastolic  parameters are indeterminate.   2. Right ventricular systolic function is normal. The right ventricular  size is normal. Tricuspid regurgitation signal is inadequate for assessing  PA pressure.   3. The mitral valve is normal in structure. Trivial mitral valve  regurgitation.   4. The aortic valve is tricuspid. Aortic valve regurgitation is not  visualized. No aortic stenosis is present.   Risk Assessment/Calculations:   {Does this patient have ATRIAL FIBRILLATION?:[phone]} No BP recorded.  {Refresh Note OR Click here to enter BP  :1}***         Physical Exam:   VS:  There were no vitals taken for this visit.   Wt Readings from Last 3 Encounters:  05/26/23 136 lb 3.9 oz (61.8 kg)  02/28/23 135 lb (61.2 kg)  09/07/22 130 lb (59 kg)     GEN: Well nourished, well developed in no acute distress NECK: No JVD; No carotid bruits CARDIAC: ***RRR, no murmurs, rubs, gallops RESPIRATORY:  Clear to auscultation without  rales, wheezing or rhonchi  ABDOMEN: Appears non-distended. No obvious abdominal masses. EXTREMITIES: No clubbing or cyanosis. No edema.  Distal pedal pulses are 2+ bilaterally.   Assessment and Plan:   1. Chronic HFrEF (heart failure with reduced ejection fraction) (HCC) - Her EF was at 30 to 35% by most recent echocardiogram in 04/2021. ***  2. LBBB (left bundle branch block)- intermittent - Noted on prior EKG tracings and prior cardiac catheterization in  2021 showed normal coronary arteries. Previously not felt to be a candidate for CRT-D given her dementia.  3. Essential hypertension - BP is at ***during today's visit.  4. CKD stage 3b, GFR 30-44 ml/min (HCC) - Her creatinine was at 1.46 when checked in 05/2023 which is close to her known baseline.   Signed, Melanie CHRISTELLA Qua, PA-C

## 2023-09-06 ENCOUNTER — Ambulatory Visit: Admitting: Student

## 2023-09-06 DIAGNOSIS — I10 Essential (primary) hypertension: Secondary | ICD-10-CM

## 2023-09-06 DIAGNOSIS — N1832 Chronic kidney disease, stage 3b: Secondary | ICD-10-CM

## 2023-09-06 DIAGNOSIS — I5022 Chronic systolic (congestive) heart failure: Secondary | ICD-10-CM

## 2023-09-06 DIAGNOSIS — I447 Left bundle-branch block, unspecified: Secondary | ICD-10-CM

## 2023-09-07 NOTE — Progress Notes (Signed)
 Cardiology Office Note    Date:  09/12/2023  ID:  Melanie Bradley, Melanie Bradley Feb 02, 1951, MRN [id number] Cardiologist: Alvan Carrier, MD Cardiology APP:  Johnson Laymon HERO, PA-C { :  History of Present Illness:    Melanie Bradley is a 73 y.o. female  with past medical history of chronic HFrEF (EF 35-40% in 10/2018 with cath in 04/2019 showing no significant CAD and cMRI in 07/2019 with no infiltrative disease, EF at 30-35% by most recent echo in 04/2021), HTN, LBBB, Stage 3 CKD and dementia who presents who presents to the office today for 34-month follow-up.  She was last examined by Dr. Alvan in 02/2023 and denied any recent chest pain or dyspnea on exertion at that time. Medical therapy for her cardiomyopathy had been limited given renal dysfunction and hypotension. Aldactone  had recently been stopped given worsening hyponatremia. She was also felt to be a poor candidate for ICD given her dementia. She was continued on ASA 81 mg daily, Farxiga  10 mg daily and Toprol -XL 25 mg daily.  In the interim, she was admitted to [facility] in 05/2023 for acute hypoxic respiratory failure in the setting of a COPD exacerbation. She responded well to IV steroids, IV antibiotics and scheduled nebulizers. It appears that her Toprol -XL was listed as 100 mg daily at discharge instead of 25 mg daily.  In talking with the patient and her husband today, most history is provided by him given her dementia. She has been using her Albuterol  twice daily since her hospitalization and they report her breathing has overall been stable. No specific orthopnea, PND or pitting edema. He does place compression stockings on her daily. She denies any recent chest pain or palpitations. She does not like drinking regular water  and mostly consumes [facility] throughout the day.  Studies Reviewed:   EKG: EKG is not ordered today.  R/LHC: 04/2019 Normal right heart pressures.  Cardiac output 3.7 L/min.  Cardiac index 2.5  L/min/m Normal left main Transapical LAD.  First diagonal contains segmental proximal 40% narrowing. Normal small ramus. Circumflex is normal. The right coronary demonstrates generalized minimal atherosclerosis.  No focal stenosis. Anterior and apical mild to moderate hypokinesis.  Estimated ejection fraction 35 to 40%.   RECOMMENDATIONS:   Guideline directed therapy for left ventricular systolic dysfunction in the setting of left bundle branch block.  If no improvement in LV function with therapy, consider resync.  Echocardiogram: 04/2021 IMPRESSIONS     1. Left ventricular ejection fraction, by estimation, is 30 to 35%. The  left ventricle has moderately decreased function. The left ventricle  demonstrates global hypokinesis. There is mild left ventricular  hypertrophy. Left ventricular diastolic  parameters are indeterminate.   2. Right ventricular systolic function is normal. The right ventricular  size is normal. Tricuspid regurgitation signal is inadequate for assessing  PA pressure.   3. The mitral valve is normal in structure. Trivial mitral valve  regurgitation.   4. The aortic valve is tricuspid. Aortic valve regurgitation is not  visualized. No aortic stenosis is present.    Physical Exam:   VS:  BP 119/83 (BP Location: Left Arm, Cuff Size: Normal)   Pulse 72   Ht 5' 1 (1.549 m)   Wt 148 lb (67.1 kg)   SpO2 95%   BMI 27.96 kg/m    Wt Readings from Last 3 Encounters:  09/12/23 148 lb (67.1 kg)  05/26/23 136 lb 3.9 oz (61.8 kg)  02/28/23 135 lb (61.2 kg)  GEN: Well nourished, well developed female appearing in no acute distress NECK: No JVD; No carotid bruits CARDIAC: RRR, no murmurs, rubs, gallops RESPIRATORY:  Clear to auscultation without rales, wheezing or rhonchi  ABDOMEN: Appears non-distended. No obvious abdominal masses. EXTREMITIES: No clubbing or cyanosis. No pitting edema.  Distal pedal pulses are 2+ bilaterally.   Assessment and Plan:    1. Chronic HFrEF (heart failure with reduced ejection fraction) (HCC) - Her EF was at 30 to 35% by most recent echocardiogram in 04/2021. She appears euvolemic by examination today and respiratory status has overall been stable. - Continue current GDMT with Farxiga  10 mg daily and Toprol -XL. She was previously intolerant to Spironolactone  due to hyponatremia and worsening renal function. Has also not been on an ACE-I/ARB/ARNI given variable BP. We discussed possibly obtaining a follow-up echocardiogram for reassessment of her EF but will hold off for now as she might not sit still for further testing and this would not impact her current management. She was previously not felt to be a candidate for an ICD given her advanced dementia and additional GDMT has been limited given her variable BP and renal function.   Addendum: She was previously listed as taking Toprol -XL 25 mg daily but was listed as 100 mg daily at the time of her recent hospitalization. Her husband is unsure of what dose she is taking and will call back tomorrow to verify. If taking 100 mg daily, could continue current dosing given her well-controlled HR and BP. If only on Toprol -XL 25mg  daily, would continue and update her pharmacy with the correct dose.   2. LBBB (left bundle branch block)- intermittent - Noted on prior EKG tracings and prior cardiac catheterization in 2021 showed normal coronary arteries. Previously not felt to be a candidate for CRT-D given her dementia.  3. Essential hypertension - BP is at 119/83 during today's visit. Continue current medical therapy with Toprol -XL.  4. CKD stage 3b, GFR 30-44 ml/min (HCC) - Her creatinine was at 1.46 when checked in 05/2023 which is close to her known baseline. Encouraged her to increase consumption of water  as compared to soda.   Signed, Laymon CHRISTELLA Qua, PA-C

## 2023-09-12 ENCOUNTER — Ambulatory Visit: Attending: Student | Admitting: Student

## 2023-09-12 ENCOUNTER — Encounter: Payer: Self-pay | Admitting: Student

## 2023-09-12 VITALS — BP 119/83 | HR 72 | Ht 61.0 in | Wt 148.0 lb

## 2023-09-12 DIAGNOSIS — N1832 Chronic kidney disease, stage 3b: Secondary | ICD-10-CM | POA: Diagnosis not present

## 2023-09-12 DIAGNOSIS — I5022 Chronic systolic (congestive) heart failure: Secondary | ICD-10-CM | POA: Diagnosis not present

## 2023-09-12 DIAGNOSIS — I447 Left bundle-branch block, unspecified: Secondary | ICD-10-CM

## 2023-09-12 DIAGNOSIS — I10 Essential (primary) hypertension: Secondary | ICD-10-CM

## 2023-09-12 NOTE — Patient Instructions (Signed)

## 2023-11-13 ENCOUNTER — Other Ambulatory Visit (HOSPITAL_COMMUNITY): Payer: Self-pay | Admitting: Endocrinology

## 2023-11-13 ENCOUNTER — Other Ambulatory Visit (HOSPITAL_COMMUNITY): Payer: Self-pay | Admitting: Family Medicine

## 2023-11-13 DIAGNOSIS — Z1231 Encounter for screening mammogram for malignant neoplasm of breast: Secondary | ICD-10-CM

## 2023-11-13 DIAGNOSIS — M810 Age-related osteoporosis without current pathological fracture: Secondary | ICD-10-CM

## 2023-11-16 ENCOUNTER — Ambulatory Visit (HOSPITAL_COMMUNITY)
Admission: RE | Admit: 2023-11-16 | Discharge: 2023-11-16 | Disposition: A | Discharge status: Home / Self Care | Source: Ambulatory Visit | Attending: Endocrinology | Admitting: Endocrinology

## 2023-11-16 DIAGNOSIS — M810 Age-related osteoporosis without current pathological fracture: Secondary | ICD-10-CM | POA: Diagnosis present

## 2023-12-17 ENCOUNTER — Ambulatory Visit (HOSPITAL_COMMUNITY)

## 2023-12-31 ENCOUNTER — Ambulatory Visit (HOSPITAL_COMMUNITY)
Admission: RE | Admit: 2023-12-31 | Discharge: 2023-12-31 | Disposition: A | Discharge status: Home / Self Care | Source: Ambulatory Visit | Attending: Family Medicine | Admitting: Family Medicine

## 2023-12-31 ENCOUNTER — Encounter (HOSPITAL_COMMUNITY): Payer: Self-pay

## 2023-12-31 DIAGNOSIS — Z1231 Encounter for screening mammogram for malignant neoplasm of breast: Secondary | ICD-10-CM | POA: Insufficient documentation

## 2024-03-20 ENCOUNTER — Ambulatory Visit: Admitting: Cardiology

## 2024-03-20 ENCOUNTER — Encounter: Payer: Self-pay | Admitting: Cardiology

## 2024-03-20 VITALS — HR 68 | Ht 60.0 in | Wt 146.6 lb

## 2024-03-20 DIAGNOSIS — I5022 Chronic systolic (congestive) heart failure: Secondary | ICD-10-CM

## 2024-03-20 DIAGNOSIS — N1832 Chronic kidney disease, stage 3b: Secondary | ICD-10-CM | POA: Diagnosis not present

## 2024-03-20 NOTE — Progress Notes (Signed)
 "     Clinical Summary Melanie Bradley is a 74 y.o.female seen today for follow up of the following medical problems.      1. Chronic systolic HF - new diagnosis during 10/2018 admission with syncope. Echo showed LVEF 35-40% Jan 2021 echo LVEF 35-40%, mild to mod MR.  04/2019 no significant CAD, normal filling pressures. CI 3.7 Mean PA 15, PCWP 7 07/2019 CMRI: LVEF 38%, normal RV. No infiltrative disease   - medical therapy has historically been limited by renal dysfunction, low bp's, and recurrent issues with dehydration.     - we restarted aldactone , caused decrease in sodium and elevation in Cr. We discontinued   04/2021 echo LVEF 30-35% - severe dementia, poor ICD candidate.    - denies SOB/DOE, slight swelling at times in legs - compliant with meds      2. Chronic LBBB - no significant CAD by cath 04/2019       3.  Memory deficit/Dementia - on aricept , followed by neuro -remains active, independent. Enjoys doing yard and housework.       4. CKD IIIB -  last Cr 1.46, GFR 38. Past Medical History:  Diagnosis Date   Atrial fibrillation (HCC)    Bloating    Complication of anesthesia      hard to wake up sometimes    Dementia (HCC)    Diverticula of colon    Epigastric burning sensation    GERD (gastroesophageal reflux disease)    Headache disorder 06/03/2014   Headache(784.0)    Helicobacter pylori gastritis 2004   Hemorrhoids 06/26/2003   Dr. Golda tcs   HFrEF (heart failure with reduced ejection fraction) (HCC)    a. EF 35-40% in 10/2018 with cath in 04/2019 showing no significant CAD and cMRI in 07/2019 with no infiltrative disease, EF at 30-35% by most recent echo in 04/2021   Hiatal hernia 06/26/2003   Dr. Golda egd   Hypertension    Hypothyroidism    Left bundle Samara Stankowski block    rate dependent LBBB 04/2013   Memory difficulty 06/03/2014   Shoulder fracture, left    Spastic colon    Wrist fracture      Allergies[1]   Current Outpatient  Medications  Medication Sig Dispense Refill   acetaminophen  (TYLENOL ) 500 MG tablet Take 1,000 mg by mouth every 6 (six) hours as needed (headaches.).     aspirin  EC 81 MG tablet Take 81 mg by mouth daily.     Calcium  Carb-Cholecalciferol (CALCIUM  600+D3 PO) Take 1 tablet by mouth in the morning and at bedtime.     Cyanocobalamin  (B-12 PO) Take 6,000 mcg by mouth daily.     dextromethorphan  (DELSYM ) 30 MG/5ML liquid Take 5 mLs (30 mg total) by mouth 2 (two) times daily as needed for cough. (Patient not taking: Reported on 09/12/2023) 89 mL 0   donepezil  (ARICEPT ) 10 MG tablet Take 1 tablet (10 mg total) by mouth at bedtime. 90 tablet 3   escitalopram  (LEXAPRO ) 10 MG tablet Take 10 mg by mouth daily with supper.   0   FARXIGA  10 MG TABS tablet TAKE 1 TABLET BEFORE BREAKFAST. 30 tablet 11   ipratropium-albuterol  (DUONEB) 0.5-2.5 (3) MG/3ML SOLN Take 3 mLs by nebulization 3 (three) times daily. 360 mL 1   memantine  (NAMENDA ) 10 MG tablet Take 1 tablet (10 mg total) by mouth 2 (two) times daily. 180 tablet 3   metoprolol  succinate (TOPROL -XL) 100 MG 24 hr tablet Take 100 mg by mouth daily.  pantoprazole  (PROTONIX ) 40 MG tablet Take 1 tablet (40 mg total) by mouth daily for 14 days. (Patient not taking: Reported on 09/12/2023) 14 tablet 0   SYNTHROID  88 MCG tablet Take 88 mcg by mouth See admin instructions. Take 1 tablet (88 mcg) by mouth on Sundays, Mondays, Tuesdays, Wednesdays,Thursdays, & Saturdays at bedtime, DOES NOT TAKE ANY Fridays.     No current facility-administered medications for this visit.     Past Surgical History:  Procedure Laterality Date   APPENDECTOMY     CATARACT EXTRACTION W/PHACO Right 01/14/2021   Procedure: CATARACT EXTRACTION PHACO AND INTRAOCULAR LENS PLACEMENT (IOC);  Surgeon: Harrie Agent, MD;  Location: [facility];  Service: Ophthalmology;  Laterality: Right;  CDE 8.87   CATARACT EXTRACTION W/PHACO Left 04/15/2021   Procedure: CATARACT EXTRACTION PHACO AND INTRAOCULAR  LENS PLACEMENT (IOC);  Surgeon: Harrie Agent, MD;  Location: [facility];  Service: Ophthalmology;  Laterality: Left;  CDE: 8.19   CHOLECYSTECTOMY     COLONOSCOPY  06/26/03   Dr. Floretta diverticula at the sigmoid colon with one of the transverse colon, normal terminal ileoscopy, small external hemorrhoids the   COLONOSCOPY N/A 01/20/2016   sigmoid diverticulosis, otherwise normal.    ESOPHAGOGASTRODUODENOSCOPY  03/15/2011   Dr. Leeroy hiatal hernia, abnormal gastric mucosa of unclear significance. Gastric biopsies negative, no H. pylori.Procedure: ESOPHAGOGASTRODUODENOSCOPY (EGD);  Surgeon: Lamar CHRISTELLA Hollingshead, MD;  Location: [facility];  Service: Endoscopy;  Laterality: N/A;  7:30   ORIF HUMERUS FRACTURE Left 05/29/2016   Procedure: LEFT OPEN REDUCTION INTERNAL FIXATION (ORIF) PROXIMAL HUMERUS FRACTURE;  Surgeon: Glendia Cordella Hutchinson, MD;  Location: [facility];  Service: Orthopedics;  Laterality: Left;   ORIF WRIST FRACTURE Left 05/29/2016   Procedure: OPEN REDUCTION INTERNAL FIXATION (ORIF) LEFT WRIST FRACTURE;  Surgeon: Glendia Cordella Hutchinson, MD;  Location: [facility];  Service: Orthopedics;  Laterality: Left;   RIGHT/LEFT HEART CATH AND CORONARY ANGIOGRAPHY N/A 04/17/2019   Procedure: RIGHT/LEFT HEART CATH AND CORONARY ANGIOGRAPHY;  Surgeon: Claudene Victory ORN, MD;  Location: [facility];  Service: Cardiovascular;  Laterality: N/A;   TUBAL LIGATION     UPPER GASTROINTESTINAL ENDOSCOPY  06/26/03   Dr. Corrinne sliding hiatal hernia with 8 mm tongue of gastric type mucosa at distal esophagus bile in the stomach.     Allergies[2]    Family History  Problem Relation Age of Onset   Hypertension Mother    Angina Mother    COPD Father    Heart failure Father    Cancer Sister    Dementia Sister    Cardiomyopathy Brother    Pneumonia Other    Migraines Sister    Colon cancer Neg Hx      Social History Melanie Bradley reports that she has been smoking cigarettes. She started smoking about  57 years ago. She has a 28.8 pack-year smoking history. She has never used smokeless tobacco. Melanie Bradley reports no history of alcohol use.     Physical Examination Today's Vitals   03/20/24 1422  Pulse: 68  SpO2: 93%  Weight: 146 lb 9.6 oz (66.5 kg)  Height: 5' (1.524 m)   Body mass index is 28.63 kg/m. Patient refused bp check  Gen: resting comfortably, no acute distress HEENT: no scleral icterus, pupils equal round and reactive, no palptable cervical adenopathy,  CV: RRR, no m/rg, no jvd Resp: Clear to auscultation bilaterally GI: abdomen is soft, non-tender, non-distended, normal bowel sounds, no hepatosplenomegaly MSK: extremities are warm, no edema.  Skin: warm, no rash  Neuro:  no focal deficits Psych: appropriate affect   Diagnostic Studies 04/2013 Lexiscan  MPI FINDINGS:   The patient's initial EKG revealed a narrow QRS. After she received   Lexiscan , the heart rate increased to 145 and the QRS widened to a   left bundle Eduar Kumpf block. The increased rate persisted for several   minutes but then began to slow. When the heart rate was slower, P   waves could be seen. There were no flutter waves. The raw data   reveals no evidence of excess motion. Tomographic images with stress   reveal a small area of moderate decreased uptake in the septum. Rest   images reveal a moderate area of decreased uptake in the septum.   These findings are most consistent with left bundle Melchor Kirchgessner block.   Wall motion analysis reveals excellent motion with an ejection   fraction of 70%.   IMPRESSION:   This is a low risk scan. There is no scar or ischemia. There is   decreased activity in the septum, consistent with left bundle Paysen Goza   block. It is noted that the QRS was narrow at the start of the   study. However with Lexiscan , the patient developed tachycardia with   left bundle Colene Mines block. Assessment shows that this was most   probably sinus tachycardia which slowed over time.    10/2018 echo IMPRESSIONS      1. The left ventricle has moderately reduced systolic function, with an ejection fraction of 35-40%. The cavity size was normal. Severe focal basal septal hypertrophy.. Diastolic dysfunction, grade indeterminate. Left ventricular diffuse hypokinesis.  2. The right ventricle has normal systolic function. The cavity was normal. There is no increase in right ventricular wall thickness.  3. The aortic valve is tricuspid. Mild thickening of the aortic valve. Mild aortic annular calcification noted.  4. The mitral valve is grossly normal. Mild thickening of the mitral valve leaflet. Mitral valve regurgitation is mild to moderate by color flow Doppler.  5. The tricuspid valve is grossly normal.  6. The aorta is normal unless otherwise noted.   12/2018 30 day event monitor 30 day event monitor Min HR 57, Max HR 130, Avg HR 79 No symptoms reported Telemetry tracings show sinus rhythm with occasional PVCs. No significant arrhythmias   Jan 2021 echo IMPRESSIONS     1. Left ventricular ejection fraction, by visual estimation, is 35 to  40%. The left ventricle has moderately decreased function. There is no  left ventricular hypertrophy.   2. Left ventricular diastolic parameters are indeterminate.   3. The left ventricle demonstrates global hypokinesis.   4. Global right ventricle has normal systolic function.The right  ventricular size is normal. No increase in right ventricular wall  thickness.   5. Left atrial size was normal.   6. Right atrial size was normal.   7. The mitral valve is normal in structure. Mild to moderate mitral valve  regurgitation. No evidence of mitral stenosis.   8. The tricuspid valve is normal in structure.   9. The aortic valve has an indeterminant number of cusps. Aortic valve  regurgitation is not visualized. No evidence of aortic valve sclerosis or  stenosis.  10. The pulmonic valve was not well visualized. Pulmonic valve   regurgitation is not visualized.  11. The inferior vena cava is normal in size with greater than 50%  respiratory variability, suggesting right atrial pressure of 3 mmHg.       04/2019 RHC/LHC Normal right heart pressures.  Cardiac output 3.7 L/min.  Cardiac index 2.5 L/min/m Normal left main Transapical LAD.  First diagonal contains segmental proximal 40% narrowing. Normal small ramus. Circumflex is normal. The right coronary demonstrates generalized minimal atherosclerosis.  No focal stenosis. Anterior and apical mild to moderate hypokinesis.  Estimated ejection fraction 35 to 40%.   RECOMMENDATIONS:   Guideline directed therapy for left ventricular systolic dysfunction in the setting of left bundle Shjon Lizarraga block.  If no improvement in LV function with therapy, consider resync.    04/3021 echo    1. Left ventricular ejection fraction, by estimation, is 30 to 35%. The  left ventricle has moderately decreased function. The left ventricle  demonstrates global hypokinesis. There is mild left ventricular  hypertrophy. Left ventricular diastolic  parameters are indeterminate.   2. Right ventricular systolic function is normal. The right ventricular  size is normal. Tricuspid regurgitation signal is inadequate for assessing  PA pressure.   3. The mitral valve is normal in structure. Trivial mitral valve  regurgitation.   4. The aortic valve is tricuspid. Aortic valve regurgitation is not  visualized. No aortic stenosis is present.     Assessment and Plan  Chronic systolic HF -recent limitations on medication regimen due to soft bp's and renal dysfunction, recurrent issues with hypovolemia.  - currently just on toprol  and farxiga . Recent retrial of aldactone  caused drop in Na and elevation in Cr. - euvolemic without symptoms, continue current meds - severe dementia, not an ICD candidate.   2. CKD IIIB -request pcp labs   Dorn PHEBE Ross, M.D.,     [1]   Allergies Allergen Reactions   Penicillins Hives and Swelling    Immediate rash, facial/tongue/throat swelling, SOB, or lightheadedness with hypotension   Benadryl [Diphenhydramine Hcl] Hives   Msm [Methylsulfonylmethane] Hives   Nubain [Nalbuphine Hcl] Hives   Sulfa Antibiotics Hives   Codeine Other (See Comments)    Unspecified reaction  Pt refuses to take  [2]  Allergies Allergen Reactions   Penicillins Hives and Swelling    Immediate rash, facial/tongue/throat swelling, SOB, or lightheadedness with hypotension   Benadryl [Diphenhydramine Hcl] Hives   Msm [Methylsulfonylmethane] Hives   Nubain [Nalbuphine Hcl] Hives   Sulfa Antibiotics Hives   Codeine Other (See Comments)    Unspecified reaction  Pt refuses to take   "

## 2024-03-20 NOTE — Patient Instructions (Signed)
 Medication Instructions:  Continue all current medications.   Labwork: none  Testing/Procedures: none  Follow-Up: 6 months   Any Other Special Instructions Will Be Listed Below (If Applicable).   If you need a refill on your cardiac medications before your next appointment, please call your pharmacy.

## 2024-03-21 ENCOUNTER — Encounter: Payer: Self-pay | Admitting: *Deleted
